# Patient Record
Sex: Female | Born: 1935 | Race: White | Hispanic: No | Smoking: Former smoker
Health system: Southern US, Community
[De-identification: ages and names within clinical notes are randomized; demographics above are authoritative.]

## PROBLEM LIST (undated history)

## (undated) DIAGNOSIS — D509 Iron deficiency anemia, unspecified: Secondary | ICD-10-CM

## (undated) DIAGNOSIS — C50911 Malignant neoplasm of unspecified site of right female breast: Secondary | ICD-10-CM

## (undated) DIAGNOSIS — C50419 Malignant neoplasm of upper-outer quadrant of unspecified female breast: Secondary | ICD-10-CM

## (undated) DIAGNOSIS — M810 Age-related osteoporosis without current pathological fracture: Secondary | ICD-10-CM

## (undated) DIAGNOSIS — F0390 Unspecified dementia without behavioral disturbance: Secondary | ICD-10-CM

## (undated) DIAGNOSIS — E538 Deficiency of other specified B group vitamins: Secondary | ICD-10-CM

## (undated) DIAGNOSIS — C801 Malignant (primary) neoplasm, unspecified: Secondary | ICD-10-CM

## (undated) DIAGNOSIS — C50919 Malignant neoplasm of unspecified site of unspecified female breast: Secondary | ICD-10-CM

## (undated) DIAGNOSIS — I10 Essential (primary) hypertension: Secondary | ICD-10-CM

## (undated) DIAGNOSIS — F40240 Claustrophobia: Secondary | ICD-10-CM

## (undated) HISTORY — DX: Age-related osteoporosis without current pathological fracture: M81.0

## (undated) HISTORY — DX: Malignant neoplasm of unspecified site of right female breast: C50.911

## (undated) HISTORY — DX: Malignant neoplasm of unspecified site of unspecified female breast: C50.919

## (undated) HISTORY — DX: Malignant neoplasm of upper-outer quadrant of unspecified female breast: C50.419

## (undated) HISTORY — PX: CATARACT EXTRACTION, BILATERAL: SHX1313

## (undated) HISTORY — PX: ABDOMINAL HYSTERECTOMY: SHX81

## (undated) HISTORY — DX: Deficiency of other specified B group vitamins: E53.8

## (undated) HISTORY — DX: Iron deficiency anemia, unspecified: D50.9

## (undated) NOTE — *Deleted (*Deleted)
[facility] 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  Her2 negative, ER 100%, PR 85%, Ki-67 17%.   03/08/2013 Surgery   Right lumpectomy with sentinel node biopsy revealing adjacent DCIS (intermediate grade).  Lymph node is negative for disease.  T1b N0.   03/24/2013 Imaging   Bone density-  Osteoporosis   04/26/2013 Survivorship   Prolia every 6 months   04/28/2013 -  Chemotherapy   Aromasin daily.  She will take for 5 years.   04/08/2015 Imaging   Bone density- There has been a statistically significant IMPROVEMENT compared to patient's previous exam of 03/24/2013.   11/14/2015 Pathology Results   BCI- low risk of late recurrence (year 5-10) at 3.7%, low likelihood of benefit from extended endocrine therapy, low risk of overall recurrence (years 0-10) 6.6%.     CHIEF COMPLIANT: Follow-up for right breast cancer   INTERVAL HISTORY: Tamara [name] is a 22 y.o. female here today for follow up of her right breast cancer. Her last visit was on 08/30/2018.   Today she reports   REVIEW OF SYSTEMS:   Review of Systems - Oncology  I have reviewed the past medical history, past surgical history, social history and family history with the patient and they are unchanged from previous note.   ALLERGIES:   has No Known Allergies.   MEDICATIONS:  Current Outpatient Medications  Medication Sig Dispense Refill  . amLODipine (NORVASC) 5 MG tablet Take 5 mg by mouth daily.    . calcium-vitamin D (OSCAL WITH D) 500-200 MG-UNIT per tablet Take 2 tablets by mouth daily with breakfast. 60 tablet 3  . cyanocobalamin (,VITAMIN B-12,) 1000 MCG/ML injection Inject 1 mL (1,000 mcg total)  into the skin every 30 (thirty) days. 10 mL 1  . donepezil (ARICEPT) 10 MG tablet     . exemestane (AROMASIN) 25 MG tablet TAKE 1 TABLET BY MOUTH ONCE DAILY AFTER BREAKFAST 90 tablet 0  . FLUAD QUADRIVALENT 0.5 ML injection     . HYDROcodone-acetaminophen (NORCO/VICODIN) 5-325 MG tablet Take 1 tablet by mouth every 4 (four) hours as needed. (Patient not taking: Reported on 04/11/2020) 10 tablet 0  . lidocaine (LIDODERM) 5 % Place 1 patch onto the skin daily. Remove & Discard patch within 12 hours or as directed by MD 5 patch 0  . losartan (COZAAR) 25 MG tablet     . NEEDLE, DISP, 27 G (BD DISP NEEDLES) 27G X 1/2" MISC 1 Syringe by Does not apply route every 30 (thirty) days. 50 each 0  . olmesartan (BENICAR) 20 MG tablet      No current facility-administered medications for this visit.     PHYSICAL EXAMINATION: Performance status (ECOG): 1 - Symptomatic but completely ambulatory  There were no vitals filed for this visit. Wt Readings from Last 3 Encounters:  04/11/20 144 lb 9.6 oz (65.6 kg)  03/01/19 146 lb (66.2 kg)  08/30/18 148 lb (67.1 kg)   Physical Exam  Breast Exam Chaperone: Deneise Lever, RN   LABORATORY DATA:  I have reviewed the data as listed CMP Latest Ref Rng & Units 04/11/2020 09/15/2019 02/24/2019  Glucose 70 - 99 mg/dL 914(N) 829(F) 92  BUN 8 - 23 mg/dL  16 18 21   Creatinine 0.44 - 1.00 mg/dL 1.61 0.96 0.45  Sodium 135 - 145 mmol/L 140 140 140  Potassium 3.5 - 5.1 mmol/L 3.8 3.8 3.9  Chloride 98 - 111 mmol/L 105 102 106  CO2 22 - 32 mmol/L 28 28 27   Calcium 8.9 - 10.3 mg/dL 9.0 4.0(J) 8.1(X)  Total Protein 6.5 - 8.1 g/dL 7.3 7.1 7.2  Total Bilirubin 0.3 - 1.2 mg/dL 0.8 0.9 0.6  Alkaline Phos 38 - 126 U/L 58 67 67  AST 15 - 41 U/L 16 16 18   ALT 0 - 44 U/L 14 13 15    No results found for: BJY782 Lab Results  Component Value Date   WBC 6.3 04/11/2020   HGB 12.4 04/11/2020   HCT 39.2 04/11/2020   MCV 97.0 04/11/2020   PLT 206 04/11/2020   NEUTROABS 4.4  04/11/2020    ASSESSMENT:  1.  Stage Ia (T1b N0 M0) right breast IDC: - Status post lumpectomy and lymph node biopsy on 03/08/2013, invasive mucinous carcinoma, 0.6 cm, grade 1, margins negative, ER/PR positive, HER-2 negative, Ki-67 17%, PT1BPN0 - Oncotype DX shows recurrence score of 20, average rate of distant recurrence of 13%. - Adjuvant chemotherapy was not offered.  Patient declined radiation therapy. -Aromasin was started in October 2014, continue dental October 2019. -BCI showed low risk of recurrence and low benefit from extended adjuvant endocrine therapy. - Today's physical examination showed right upper outer quadrant lumpectomy scar well-healed.  No palpable mass in bilateral breast.  No palpable axillary adenopathy. -We have also reviewed mammogram dated 02/15/2018 which was BI-RADS Category 2. -We will schedule her for another mammogram in July.  We will see her back in 6 months for follow-up.  2.  Osteoporosis: -DEXA scan on 04/15/2017 shows T score of -2.5. -She is receiving Prolia every 6 months, last on 11/25/2017.  She missed her follow-up injections after that. -We will restart her back on Prolia today.  She was told to take calcium supplements daily.  3.  B12 deficiency: -She was receiving B12 injections monthly until May 2019. -We have reviewed her B12 level which was in the 170 range.  Hence I have recommended her to go back on B12 injections monthly.  We will start them today.   PLAN:  1.  Stage Ia (T1b N0 M0) right breast IDC:  2.  Osteoporosis:  3.  B12 deficiency: ***   Breast Cancer therapy associated bone loss: I have recommended calcium, Vitamin D and weight bearing exercises.  I spent 25 minutes talking to the patient of which more than half was spent in counseling and coordination of care.  No orders of the defined types were placed in this encounter.  The patient has a good understanding of the overall plan. she agrees with it. she will call with  any problems that may develop before the next visit here.    Doreatha Massed, MD [facility] [phone]   I, Drue Second, am acting as a scribe for Dr. Payton Mccallum.  {Add Production assistant, radio Statement}

---

## 2000-12-22 ENCOUNTER — Ambulatory Visit (HOSPITAL_COMMUNITY): Admission: RE | Admit: 2000-12-22 | Discharge: 2000-12-22 | Payer: Self-pay | Admitting: General Surgery

## 2001-10-13 ENCOUNTER — Encounter: Payer: Self-pay | Admitting: Internal Medicine

## 2001-10-13 ENCOUNTER — Ambulatory Visit (HOSPITAL_COMMUNITY): Admission: RE | Admit: 2001-10-13 | Discharge: 2001-10-13 | Payer: Self-pay | Admitting: Internal Medicine

## 2001-10-25 ENCOUNTER — Ambulatory Visit (HOSPITAL_COMMUNITY): Admission: RE | Admit: 2001-10-25 | Discharge: 2001-10-25 | Payer: Self-pay | Admitting: Internal Medicine

## 2002-10-30 ENCOUNTER — Encounter: Payer: Self-pay | Admitting: Internal Medicine

## 2002-10-30 ENCOUNTER — Ambulatory Visit (HOSPITAL_COMMUNITY): Admission: RE | Admit: 2002-10-30 | Discharge: 2002-10-30 | Payer: Self-pay | Admitting: Internal Medicine

## 2003-11-02 ENCOUNTER — Ambulatory Visit (HOSPITAL_COMMUNITY): Admission: RE | Admit: 2003-11-02 | Discharge: 2003-11-02 | Payer: Self-pay | Admitting: Internal Medicine

## 2004-11-13 ENCOUNTER — Ambulatory Visit (HOSPITAL_COMMUNITY): Admission: RE | Admit: 2004-11-13 | Discharge: 2004-11-13 | Payer: Self-pay | Admitting: Internal Medicine

## 2005-11-16 ENCOUNTER — Ambulatory Visit (HOSPITAL_COMMUNITY): Admission: RE | Admit: 2005-11-16 | Discharge: 2005-11-16 | Payer: Self-pay | Admitting: Internal Medicine

## 2005-12-16 ENCOUNTER — Encounter (INDEPENDENT_AMBULATORY_CARE_PROVIDER_SITE_OTHER): Payer: Self-pay | Admitting: *Deleted

## 2005-12-16 ENCOUNTER — Ambulatory Visit (HOSPITAL_COMMUNITY): Admission: RE | Admit: 2005-12-16 | Discharge: 2005-12-16 | Payer: Self-pay | Admitting: General Surgery

## 2006-04-10 ENCOUNTER — Observation Stay (HOSPITAL_COMMUNITY): Admission: EM | Admit: 2006-04-10 | Discharge: 2006-04-12 | Payer: Self-pay | Admitting: Emergency Medicine

## 2006-11-19 ENCOUNTER — Ambulatory Visit (HOSPITAL_COMMUNITY): Admission: RE | Admit: 2006-11-19 | Discharge: 2006-11-19 | Payer: Self-pay | Admitting: Internal Medicine

## 2007-05-24 ENCOUNTER — Ambulatory Visit (HOSPITAL_COMMUNITY): Admission: RE | Admit: 2007-05-24 | Discharge: 2007-05-24 | Payer: Self-pay | Admitting: Internal Medicine

## 2007-06-15 ENCOUNTER — Ambulatory Visit: Payer: Self-pay | Admitting: Internal Medicine

## 2007-06-28 DIAGNOSIS — R05 Cough: Secondary | ICD-10-CM

## 2007-06-28 DIAGNOSIS — R059 Cough, unspecified: Secondary | ICD-10-CM | POA: Insufficient documentation

## 2007-06-29 ENCOUNTER — Ambulatory Visit: Payer: Self-pay | Admitting: Internal Medicine

## 2007-07-05 ENCOUNTER — Ambulatory Visit: Payer: Self-pay | Admitting: Internal Medicine

## 2007-08-10 ENCOUNTER — Ambulatory Visit: Payer: Self-pay | Admitting: Internal Medicine

## 2007-08-10 DIAGNOSIS — M810 Age-related osteoporosis without current pathological fracture: Secondary | ICD-10-CM

## 2007-11-22 ENCOUNTER — Ambulatory Visit (HOSPITAL_COMMUNITY): Admission: RE | Admit: 2007-11-22 | Discharge: 2007-11-22 | Payer: Self-pay | Admitting: Internal Medicine

## 2008-11-23 ENCOUNTER — Ambulatory Visit (HOSPITAL_COMMUNITY): Admission: RE | Admit: 2008-11-23 | Discharge: 2008-11-23 | Payer: Self-pay | Admitting: Internal Medicine

## 2009-11-28 ENCOUNTER — Ambulatory Visit (HOSPITAL_COMMUNITY): Admission: RE | Admit: 2009-11-28 | Discharge: 2009-11-28 | Payer: Self-pay | Admitting: Internal Medicine

## 2010-12-01 ENCOUNTER — Other Ambulatory Visit (HOSPITAL_COMMUNITY): Payer: Self-pay | Admitting: Internal Medicine

## 2010-12-01 DIAGNOSIS — Z139 Encounter for screening, unspecified: Secondary | ICD-10-CM

## 2010-12-02 ENCOUNTER — Ambulatory Visit (HOSPITAL_COMMUNITY)
Admission: RE | Admit: 2010-12-02 | Discharge: 2010-12-02 | Disposition: A | Discharge status: Home / Self Care | Payer: Medicare HMO | Source: Ambulatory Visit | Attending: Internal Medicine | Admitting: Internal Medicine

## 2010-12-02 DIAGNOSIS — Z139 Encounter for screening, unspecified: Secondary | ICD-10-CM

## 2010-12-02 DIAGNOSIS — Z1231 Encounter for screening mammogram for malignant neoplasm of breast: Secondary | ICD-10-CM | POA: Insufficient documentation

## 2010-12-02 NOTE — Assessment & Plan Note (Signed)
[facility]                             PULMONARY OFFICE NOTE   NAME:[name], [name]                         MRN:          [id number]  DATE:06/14/2007                            DOB:          February 09, 1934    CHIEF COMPLAINT:  Cough.   REQUESTING PHYSICIAN:  Kingsley Callander. Ouida Sills, M.D.   REASON FOR CONSULTATION:  Possible pulmonary fibrosis.   HISTORY:  This is a 77 year old white female who actually first started  coughing back in the 1970s, when she was smoking.  However, the cough  seemed much better after stopping smoking.  Over the last several months  it has worsened but in retrospect may have been present for at least  several years off and on.  It is more daytime than night-time.  It is  associated with mild nasal congestion but no excess nasal secretion,  fevers, chills, sweats, orthopnea, PND, or leg swelling.  She denies any  significant dyspnea, although she admits she is not a very active lady.  She has no problems with activities of daily living including housework.   The patient has no history of any myalgias, arthralgias, unintended  weight loss, fevers, chills, sweats.  She has not been exposed to  Macrodantin or amiodarone to her knowledge and never has been diagnosed  with cancer or received chemotherapy of any kind.   PAST MEDICAL HISTORY:  Significant for hysterectomy in 1970s for benign  reasons.   ALLERGIES:  None known.   MEDICATIONS:  None regularly.   SOCIAL HISTORY:  She quit smoking in 1970.  She has worked at [employer]  with no unusual industrial or hobby exposure.   FAMILY HISTORY:  Positive for emphysema in her father and brother who  were heavy smokers.  Cancer in her brother and father also but with no  known primary.   REVIEW OF SYSTEMS:  Taken in detail on the worksheet and is significant  for the problems outlined above.   PHYSICAL EXAMINATION:  GENERAL:  This is a pleasant ambulatory white  female with somewhat of a  hoarse texture with nasal tone voice.  VITAL SIGNS:  She is afebrile with normal vital signs.  HEENT:  Shows moderate turbinates edema with mucopurulent secretions  present on the right.  Oropharynx is clear.  NECK:  Supple without cervical adenopathy or tenderness.  Trachea is  midline.  No thyromegaly.  LUNGS:  Complete clear bilaterally on auscultation percussion except for  minimum crackles on the bases on deep inspiratory maneuvers which do not  produce a cough.  HEART:  There is a regular rate and rhythm without murmur, gallop, or  rub present.  ABDOMEN:  Soft, benign with no palpable organomegaly, masses, or  tenderness.  EXTREMITIES:  Warm without calf tenderness, cyanosis, clubbing.   Chest x-ray was reviewed from May 24, 2007, and shows very mild  pulmonary fibrosis pattern with no prior comparison.   LABORATORY:  Dated from November 17, 2006, shows no significant  eosinophilia.  Bicarb level is normal.   IMPRESSION:  Chronic cough dating back weeks to months  with a very nasal  tone to her voice suggests a diagnosis of chronic sinusitis post drip  nasal syndrome, except for the cough is more present during the day and  is dry in nature.  Therefore, the sinus findings may be unrelated to the  cough.  In addition, she does not have the typical cough of a patient  with pulmonary fibrosis in that it does not occur reproducibly on  inspiration.  I am not able, therefore, to make a unifying diagnosis.   I did recommend the following specifics today:  1. Since she has been on Fosamax long term and reflux is the most      common identifiable and reversible cause of both chronic cough and      fibrosis in the airway, I recommend she stop it for now and also      educate her on a GERD diet.  2. I am going to give her 10 days of Augmentin and prednisone for 6      days to see to what extent it improves the nasal tone to her voice      and her cough.  I have asked her to avoid  antihistamines in favor      of Advil Cold and Sinus p.r.n. nasal congestion and we will have      her come back in 2 weeks for PFTs and if not improved clinically at      that point, will need sinus and chest CT scan, IgE level, sed rate,      CBC with differential, and BNP to complete the workup.     Charlaine Dalton. Sherene Sires, MD, [facility]  Electronically Signed    MBW/MedQ  DD: 06/16/2007  DT: 06/16/2007  Job #: [id number]   cc:   Kingsley Callander. Ouida Sills, MD

## 2010-12-05 NOTE — H&P (Signed)
NAME:  Tamara [name], Tamara [name]                  ACCOUNT NO.:  [id number]   MEDICAL RECORD NO.:  [id number]          PATIENT TYPE:  AMB   LOCATION:                                FACILITY:  [facility]   PHYSICIAN:  Dalia Heading, M.D.  DATE OF BIRTH:  1934-07-18   DATE OF ADMISSION:  DATE OF DISCHARGE:  LH                                HISTORY & PHYSICAL   AGE:  77 years old   CHIEF COMPLAINT:  Family history of colon carcinoma.   HISTORY OF PRESENT ILLNESS:  Patient is a 77 year old of white female who  was referred for screening colonoscopy.  She has a brother who has been  diagnosed with colon cancer and thus needs a follow-up colonoscopy.  She  last had a colonoscopy in 2002.  That one revealed left-sided  diverticulosis.  She denies any recent gastrointestinal complaints.   PAST MEDICAL HISTORY:  Osteoporosis.   PAST SURGICAL HISTORY:  As noted above, hysterectomy.   CURRENT MEDICATIONS:  Fosamax, Xanax p.r.n.   ALLERGIES:  No known drug allergies.   REVIEW OF SYSTEMS:  Patient denies drinking smoking.  She denies any other  cardiopulmonary difficulties or bleeding disorders.   PHYSICAL EXAMINATION:  GENERAL:  Patient is a well-developed, well-nourished  white female in no acute distress.  LUNGS:  Clear to auscultation with equal breath sounds bilaterally.  HEART:  Regular rate and rhythm without S3, S4, or murmurs.  ABDOMEN:  Soft, nontender, nondistended.  RECTAL:  Deferred to the procedure.   IMPRESSION:  Family history of colon cancer.   PLAN:  The patient is scheduled for colonoscopy on Dec 16, 2005.  The risks  and benefits of the procedure including bleeding and perforation were fully  explained to the patient, gave informed consent.      Dalia Heading, M.D.  Electronically Signed     MAJ/MEDQ  D:  12/08/2005  T:  12/08/2005  Job:  [id number]   cc:   [facility]  Fax: [phone]   Kingsley Callander. Ouida Sills, MD  Fax: [phone]

## 2010-12-05 NOTE — Discharge Summary (Signed)
Tamara [name], Tamara [name]                  ACCOUNT NO.:  [id number]   MEDICAL RECORD NO.:  [id number]          PATIENT TYPE:  OBV   LOCATION:  A201                          FACILITY:  [facility]   PHYSICIAN:  Kingsley Callander. Ouida Sills, MD       DATE OF BIRTH:  12/10/1933   DATE OF ADMISSION:  04/10/2006  DATE OF DISCHARGE:  09/24/2007LH                                 DISCHARGE SUMMARY   DISCHARGE DIAGNOSES:  1. Lower gastrointestinal bleed likely due to diverticulosis.  2. Normocytic anemia   HOSPITAL COURSE:  This patient is a 77 year old female who presented with  bright red rectal bleeding.  She was hospitalized and monitored. There were  no episodes of hypotension.  Her hemoglobin trended down over 2 days to 8.2  and then stabilized. Followup hemoglobin is 8.3.  She denies abdominal pain.  She has not had bleeding for over a day.  She had undergone a colonoscopy  which revealed diverticulosis in May 2007. Aspirin was stopped.  She was  treated empirically with Protonix.  She will have a followup hemoglobin in 1  week. Her diet has been advanced without any problems.  She will follow up  promptly if she has recurrent bleeding, abdominal pain or any syncopal  symptoms. She has fortunately not required a transfusion thus far.   DISCHARGE MEDICATIONS:  1. Iron twice a day.  2. Calcium b.i.d.  3. Multivitamin daily.  4. Fosamax weekly which she will restart in a week.   FAMILY HISTORY:  In 2 weeks, hemoglobin and hematocrit in a week.      Kingsley Callander. Ouida Sills, MD  Electronically Signed     ROF/MEDQ  D:  04/12/2006  T:  04/14/2006  Job:  [id number]

## 2010-12-05 NOTE — H&P (Signed)
NAMEVASHON, ARCH                  ACCOUNT NO.:  [id number]   MEDICAL RECORD NO.:  [id number]          PATIENT TYPE:  OBV   LOCATION:  A201                          FACILITY:  [facility]   PHYSICIAN:  Edward L. Juanetta Gosling, M.D.DATE OF BIRTH:  1933-09-13   DATE OF ADMISSION:  04/10/2006  DATE OF DISCHARGE:  LH                                HISTORY & PHYSICAL   REASON FOR VISIT:  Is a patient of Dr. Alonza Smoker being brought in for  observation.   HISTORY OF PRESENT ILLNESS:  [name] is a 77 year old who noticed today  that she had blood in her underwear.  She then had a bowel movement which  also had blood in it.  She has never had any previous similar problems.  She  does have a history of diverticulosis on the left side apparently.  She had  a colonoscopy done this year that was said to be okay.  She does have a  brother who had colon cancer.  Her personal history is essentially negative.  She does have osteoporosis and she currently takes a multiple vitamin,  calcium and aspirin 81 mg daily.  Surgically she has had a hysterectomy but  no other surgeries. Her family history as mentioned is positive for colon  cancer in a brother.   SOCIAL HISTORY:  She lives at home with her husband.  She drinks alcohol on  very rare occasions.  She stopped smoking greater than 30 years ago.   PHYSICAL EXAMINATION:  GENERAL:  Shows a well-developed, well-nourished  female who does not appear to be in any acute distress.  She does not have  any postural changes.  Her color is good.  VITALS:  Her blood pressure is 120/70, pulse is 80.  HEENT:  Mucous membranes are moist.  Her conjunctive do not show any  evidence of pallor.  CHEST:  Clear.  HEART:  Regular without gallop.  ABDOMEN:  Soft, nontender.  RECTAL EXAM:  Done by Dr. Margretta Ditty in the emergency room shows fresh blood  on the examining finger. No hemorrhoids.   LABORATORY DATA:  Lab work is pending.   ASSESSMENT:  She has GI bleeding which appears  at this point to be fairly  minor but she certainly could be having a diverticular bleed.  I am going to  go ahead and have her in observation.  Check hemoglobin/hematocrit every 4  hours times 24 hours.  Give her some IV fluids, clear liquid diet.      Edward L. Juanetta Gosling, M.D.  Electronically Signed    ELH/MEDQ  D:  04/10/2006  T:  04/12/2006  Job:  [id number]

## 2010-12-12 ENCOUNTER — Other Ambulatory Visit (HOSPITAL_COMMUNITY): Payer: Self-pay | Admitting: Internal Medicine

## 2010-12-12 DIAGNOSIS — J8410 Pulmonary fibrosis, unspecified: Secondary | ICD-10-CM

## 2010-12-12 DIAGNOSIS — R0602 Shortness of breath: Secondary | ICD-10-CM

## 2010-12-17 ENCOUNTER — Ambulatory Visit (HOSPITAL_COMMUNITY)
Admission: RE | Admit: 2010-12-17 | Discharge: 2010-12-17 | Disposition: A | Discharge status: Home / Self Care | Payer: Medicare HMO | Source: Ambulatory Visit | Attending: Internal Medicine | Admitting: Internal Medicine

## 2010-12-17 ENCOUNTER — Encounter (HOSPITAL_COMMUNITY): Payer: Self-pay

## 2010-12-17 ENCOUNTER — Ambulatory Visit (HOSPITAL_COMMUNITY): Payer: Medicare HMO

## 2010-12-17 DIAGNOSIS — J8410 Pulmonary fibrosis, unspecified: Secondary | ICD-10-CM

## 2010-12-17 DIAGNOSIS — Z87891 Personal history of nicotine dependence: Secondary | ICD-10-CM | POA: Insufficient documentation

## 2010-12-17 DIAGNOSIS — R0602 Shortness of breath: Secondary | ICD-10-CM | POA: Insufficient documentation

## 2010-12-17 DIAGNOSIS — M818 Other osteoporosis without current pathological fracture: Secondary | ICD-10-CM | POA: Insufficient documentation

## 2010-12-17 HISTORY — DX: Essential (primary) hypertension: I10

## 2010-12-22 NOTE — H&P (Signed)
  NAMEAMARACHUKWU, LAKATOS                  ACCOUNT NO.:  [id number]  MEDICAL RECORD NO.:  [id number]  LOCATION:                                 FACILITY:  PHYSICIAN:  Dalia Heading, M.D.  DATE OF BIRTH:  1933-12-19  DATE OF ADMISSION: DATE OF DISCHARGE:  LH                             HISTORY & PHYSICAL   CHIEF COMPLAINT:  Need for screening colonoscopy.  HISTORY OF PRESENT ILLNESS:  The patient is a 77 year old white female who presents for a followup colonoscopy.  She needs a colonoscopy due to a family history of colon cancer in her brother.  Her last colonoscopy was over 5 years ago.  She denies any gastrointestinal complaints.  PAST MEDICAL HISTORY:  Hypertension.  PAST SURGICAL HISTORY:  Unremarkable.  CURRENT MEDICATIONS:  Evista, amlodipine, multivitamins.  ALLERGIES:  No known drug allergies.  REVIEW OF SYSTEMS:  The patient denies drinking or smoking.  She denies any other cardiopulmonary difficulties or bleeding disorders.  FAMILY MEDICAL HISTORY:  Remarkable for a brother with colon cancer.  He has since passed away.  PHYSICAL EXAMINATION:  GENERAL:  The patient is a well-developed, well- nourished white female in no acute distress.  She is afebrile. VITAL SIGNS:  Stable. HEENT:  Unremarkable. NECK:  Supple without lymphadenopathy. LUNGS:  Clear to auscultation with equal breath sounds bilaterally. HEART:  Regular rate and rhythm without S3, S4, murmurs. ABDOMEN:  Soft, nontender, nondistended.  No hepatosplenomegaly or masses are noted. RECTAL:  Deferred to the procedure.  IMPRESSION:  Need for screening colonoscopy.  PLAN:  The patient is scheduled for a colonoscopy on December 23, 2010.  The risks and benefits of the procedure including bleeding and perforation were fully explained to the patient, gave informed consent.     Dalia Heading, M.D.     MAJ/MEDQ  D:  12/18/2010  T:  12/19/2010  Job:  [id number]  cc:   Short Stay at [facility].  Kingsley Callander.  Ouida Sills, MD Fax: [phone]  Electronically Signed by Franky Macho M.D. on 12/22/2010 07:47:53 PM

## 2010-12-23 ENCOUNTER — Ambulatory Visit (HOSPITAL_COMMUNITY)
Admission: RE | Admit: 2010-12-23 | Discharge: 2010-12-23 | Disposition: A | Discharge status: Home / Self Care | Payer: Medicare HMO | Source: Ambulatory Visit | Attending: General Surgery | Admitting: General Surgery

## 2010-12-23 DIAGNOSIS — Z79899 Other long term (current) drug therapy: Secondary | ICD-10-CM | POA: Insufficient documentation

## 2010-12-23 DIAGNOSIS — Z1211 Encounter for screening for malignant neoplasm of colon: Secondary | ICD-10-CM | POA: Insufficient documentation

## 2010-12-23 DIAGNOSIS — Z800 Family history of malignant neoplasm of digestive organs: Secondary | ICD-10-CM | POA: Insufficient documentation

## 2010-12-23 DIAGNOSIS — I10 Essential (primary) hypertension: Secondary | ICD-10-CM | POA: Insufficient documentation

## 2010-12-23 DIAGNOSIS — K5730 Diverticulosis of large intestine without perforation or abscess without bleeding: Secondary | ICD-10-CM | POA: Insufficient documentation

## 2010-12-25 NOTE — Op Note (Signed)
  NAMECALLEEN, Tamara [name]                  ACCOUNT NO.:  [id number]  MEDICAL RECORD NO.:  [id number]  LOCATION:  DAYP                          FACILITY:  [facility]  PHYSICIAN:  Dalia Heading, M.D.  DATE OF BIRTH:  1933-11-04  DATE OF PROCEDURE:  12/23/2010 DATE OF DISCHARGE:                              OPERATIVE REPORT   PREOPERATIVE DIAGNOSIS:  Need for screening colonoscopy.  POSTOPERATIVE DIAGNOSES:  Need for screening colonoscopy, descending colon diverticulosis.  PROCEDURE:  Colonoscopy.  SURGEON:  Dalia Heading, MD  ANESTHESIA: 1. Demerol 50 mg IV. 2. Versed 4 mg IV  INDICATIONS:  The patient is a 77 year old white female who presents for screening colonoscopy.  She also needs a colonoscopy due to a family history of colon cancer in her brother.  The risks and benefits of the procedure including bleeding and perforation were fully explained to the patient, gave informed consent.  PROCEDURE NOTE:  The patient was placed in the left lateral decubitus position after placement of nasal cannula, pulse oximetry, and noninvasive blood pressure monitoring.  Demerol and Versed were used throughout the procedure for anesthesia.  Rectal examination was performed which was negative.  The colonoscope was advanced to the cecum with moderate difficulty due to a less than adequate bowel prep.  The cecum was fully visualized and noted to be within normal limits.  Confirmation of placement to the cecum was done using transabdominal palpation and landmarks.  The cecum, ascending colon, transverse colon were all within normal limits.  No abnormal lesions were noted.  In the descending and sigmoid colon regions, extensive diverticular disease was seen.  There was no stricture formation.  The rectum was within normal limits.  No abnormal lesions were noted.  On retroflexion of the colonoscope, the dentate line was inspected and noted to be within normal limits.  The colonoscope was returned  to the neutral position, and all air was evacuated from the colon and rectum prior to removal of the endoscope.  The patient tolerated the procedure well and was transferred back to Day Surgery in stable condition.  COMPLICATIONS:  None.  SPECIMEN:  None.  RECOMMENDATIONS:  A followup colonoscopy is recommended in 5 years due to her family history of colon cancer.  A diverticular diet has been instructed for the patient.     Dalia Heading, M.D.     MAJ/MEDQ  D:  12/23/2010  T:  12/23/2010  Job:  [id number]  cc:   Kingsley Callander. Ouida Sills, MD Fax: [phone]  Electronically Signed by Franky Macho M.D. on 12/25/2010 06:31:49 PM

## 2011-11-02 ENCOUNTER — Other Ambulatory Visit (HOSPITAL_COMMUNITY): Payer: Self-pay | Admitting: Internal Medicine

## 2011-11-02 DIAGNOSIS — Z139 Encounter for screening, unspecified: Secondary | ICD-10-CM

## 2011-12-07 ENCOUNTER — Ambulatory Visit (HOSPITAL_COMMUNITY)
Admission: RE | Admit: 2011-12-07 | Discharge: 2011-12-07 | Disposition: A | Discharge status: Home / Self Care | Payer: Medicare HMO | Source: Ambulatory Visit | Attending: Internal Medicine | Admitting: Internal Medicine

## 2011-12-07 DIAGNOSIS — Z139 Encounter for screening, unspecified: Secondary | ICD-10-CM

## 2011-12-07 DIAGNOSIS — Z1231 Encounter for screening mammogram for malignant neoplasm of breast: Secondary | ICD-10-CM | POA: Insufficient documentation

## 2011-12-08 ENCOUNTER — Ambulatory Visit (HOSPITAL_COMMUNITY): Payer: Medicare HMO

## 2012-12-22 ENCOUNTER — Other Ambulatory Visit (HOSPITAL_COMMUNITY): Payer: Self-pay | Admitting: Internal Medicine

## 2012-12-22 DIAGNOSIS — Z139 Encounter for screening, unspecified: Secondary | ICD-10-CM

## 2012-12-26 ENCOUNTER — Ambulatory Visit (HOSPITAL_COMMUNITY)
Admission: RE | Admit: 2012-12-26 | Discharge: 2012-12-26 | Disposition: A | Discharge status: Home / Self Care | Payer: Medicare Other | Source: Ambulatory Visit | Attending: Internal Medicine | Admitting: Internal Medicine

## 2012-12-26 DIAGNOSIS — Z1231 Encounter for screening mammogram for malignant neoplasm of breast: Secondary | ICD-10-CM | POA: Insufficient documentation

## 2012-12-26 DIAGNOSIS — Z139 Encounter for screening, unspecified: Secondary | ICD-10-CM

## 2012-12-27 ENCOUNTER — Other Ambulatory Visit: Payer: Self-pay | Admitting: Internal Medicine

## 2012-12-27 DIAGNOSIS — R928 Other abnormal and inconclusive findings on diagnostic imaging of breast: Secondary | ICD-10-CM

## 2013-01-04 ENCOUNTER — Other Ambulatory Visit: Payer: Self-pay | Admitting: Internal Medicine

## 2013-01-04 ENCOUNTER — Other Ambulatory Visit (HOSPITAL_COMMUNITY): Payer: Self-pay | Admitting: Internal Medicine

## 2013-01-04 ENCOUNTER — Ambulatory Visit (HOSPITAL_COMMUNITY)
Admission: RE | Admit: 2013-01-04 | Discharge: 2013-01-04 | Disposition: A | Discharge status: Home / Self Care | Payer: Medicare Other | Source: Ambulatory Visit | Attending: Internal Medicine | Admitting: Internal Medicine

## 2013-01-04 ENCOUNTER — Ambulatory Visit: Payer: Medicare Other

## 2013-01-04 DIAGNOSIS — R928 Other abnormal and inconclusive findings on diagnostic imaging of breast: Secondary | ICD-10-CM

## 2013-01-04 DIAGNOSIS — N63 Unspecified lump in unspecified breast: Secondary | ICD-10-CM | POA: Insufficient documentation

## 2013-01-04 MED ORDER — LIDOCAINE HCL (PF) 2 % IJ SOLN
INTRAMUSCULAR | Status: AC
  Filled 2013-01-04: qty 10

## 2013-01-11 ENCOUNTER — Ambulatory Visit (HOSPITAL_COMMUNITY)
Admission: RE | Admit: 2013-01-11 | Discharge: 2013-01-11 | Disposition: A | Discharge status: Home / Self Care | Payer: Medicare Other | Source: Ambulatory Visit | Attending: Internal Medicine | Admitting: Internal Medicine

## 2013-01-11 DIAGNOSIS — C50919 Malignant neoplasm of unspecified site of unspecified female breast: Secondary | ICD-10-CM | POA: Insufficient documentation

## 2013-01-11 MED ORDER — LIDOCAINE HCL (PF) 2 % IJ SOLN
INTRAMUSCULAR | Status: AC
  Administered 2013-01-11: 9 mL via INTRADERMAL
  Filled 2013-01-11: qty 10

## 2013-01-11 NOTE — Progress Notes (Signed)
Lidocaine 2%         9mL injected                         Right breast biopsy performed  

## 2013-01-24 ENCOUNTER — Ambulatory Visit: Payer: Self-pay | Admitting: Ophthalmology

## 2013-02-07 ENCOUNTER — Other Ambulatory Visit (HOSPITAL_COMMUNITY): Payer: Self-pay | Admitting: General Surgery

## 2013-02-07 DIAGNOSIS — C50911 Malignant neoplasm of unspecified site of right female breast: Secondary | ICD-10-CM

## 2013-02-13 NOTE — H&P (Signed)
Tamara [name] is an 79 y.o. female.   Chief Complaint: Right breast carcinoma HPI: Patient is a 79 year old white female who was found on routine mammography to have a suspicious lesion in the right breast. Biopsy showed this to be invasive ductal carcinoma, 8 mm in greatest diameter. There is no family history breast cancer. No nipple discharge or palpable masses noted by the patient.  Past Medical History  Diagnosis Date  . Hypertension     No past surgical history on file.  No family history on file. Social History:  has no tobacco, alcohol, and drug history on file.  Allergies: Allergies not on file  No prescriptions prior to admission    No results found for this or any previous visit (from the past 48 hour(s)). No results found.  Review of Systems  All other systems reviewed and are negative.    There were no vitals taken for this visit. Physical Exam  Constitutional: She is oriented to person, place, and time. She appears well-developed and well-nourished.  HENT:  Head: Normocephalic and atraumatic.  Neck: Normal range of motion. Neck supple.  Cardiovascular: Normal rate, regular rhythm and normal heart sounds.   Respiratory: Effort normal and breath sounds normal.  GI: Soft. Bowel sounds are normal.  Neurological: She is alert and oriented to person, place, and time.  Skin: Skin is warm and dry.   breast: No dominant mass, nipple discharge, dimpling in either breast. Both axillas negative for palpable nodes.  Assessment/Plan Impression: Right breast carcinoma, clinical stage I  Plan: The patient scheduled for right partial mastectomy after needle localization, sentinel lymph node biopsy, possible axillary dissection. The risks and benefits of the procedures including bleeding, infection, and a possibly nerve injury were fully explained to the patient, who gave informed consent.  Adrieana Fennelly A 02/13/2013, 11:31 AM

## 2013-02-14 ENCOUNTER — Ambulatory Visit: Payer: Self-pay | Admitting: Ophthalmology

## 2013-02-20 ENCOUNTER — Encounter (HOSPITAL_COMMUNITY): Payer: Self-pay | Admitting: Pharmacy Technician

## 2013-02-23 ENCOUNTER — Other Ambulatory Visit (HOSPITAL_COMMUNITY): Payer: Medicare Other

## 2013-03-01 ENCOUNTER — Encounter (HOSPITAL_COMMUNITY): Payer: Medicare Other

## 2013-03-01 ENCOUNTER — Other Ambulatory Visit (HOSPITAL_COMMUNITY): Payer: Medicare Other

## 2013-03-02 ENCOUNTER — Encounter (HOSPITAL_COMMUNITY)
Admission: RE | Admit: 2013-03-02 | Discharge: 2013-03-02 | Disposition: A | Discharge status: Home / Self Care | Payer: Medicare Other | Source: Ambulatory Visit | Attending: General Surgery | Admitting: General Surgery

## 2013-03-02 ENCOUNTER — Encounter (HOSPITAL_COMMUNITY): Payer: Self-pay

## 2013-03-02 ENCOUNTER — Ambulatory Visit (HOSPITAL_COMMUNITY): Payer: Medicare Other

## 2013-03-02 DIAGNOSIS — Z01812 Encounter for preprocedural laboratory examination: Secondary | ICD-10-CM | POA: Insufficient documentation

## 2013-03-02 DIAGNOSIS — Z01810 Encounter for preprocedural cardiovascular examination: Secondary | ICD-10-CM | POA: Insufficient documentation

## 2013-03-02 DIAGNOSIS — Z01818 Encounter for other preprocedural examination: Secondary | ICD-10-CM | POA: Insufficient documentation

## 2013-03-02 LAB — CBC WITH DIFFERENTIAL/PLATELET
Basophils Relative: 0 % (ref 0–1)
Eosinophils Absolute: 0.2 10*3/uL (ref 0.0–0.7)
Hemoglobin: 11.7 g/dL — ABNORMAL LOW (ref 12.0–15.0)
MCH: 31.3 pg (ref 26.0–34.0)
MCHC: 32.8 g/dL (ref 30.0–36.0)
Monocytes Relative: 9 % (ref 3–12)
Neutrophils Relative %: 63 % (ref 43–77)
Platelets: 220 10*3/uL (ref 150–400)

## 2013-03-02 LAB — COMPREHENSIVE METABOLIC PANEL
Albumin: 3.4 g/dL — ABNORMAL LOW (ref 3.5–5.2)
Alkaline Phosphatase: 60 U/L (ref 39–117)
BUN: 13 mg/dL (ref 6–23)
Potassium: 4.1 mEq/L (ref 3.5–5.1)
Total Protein: 6.8 g/dL (ref 6.0–8.3)

## 2013-03-02 NOTE — Patient Instructions (Addendum)
Tamara [name]  03/02/2013   Your procedure is scheduled on:  03/08/2013  Report to [facility] at  700  AM.  Call this number if you have problems the morning of surgery: [phone]   Remember:   Do not eat food or drink liquids after midnight.   Take these medicines the morning of surgery with A SIP OF WATER: norvasc, claritin, evista   Do not wear jewelry, make-up or nail polish.  Do not wear lotions, powders, or perfumes.   Do not shave 48 hours prior to surgery. Men may shave face and neck.  Do not bring valuables to the hospital.  [facility] is not responsible for any belongings or valuables.  Contacts, dentures or bridgework may not be worn into surgery.  Leave suitcase in the car. After surgery it may be brought to your room.  For patients admitted to the hospital, checkout time is 11:00 AM the day of discharge.   Patients discharged the day of surgery will not be allowed to drive  home.  Name and phone number of your driver: family  Special Instructions: Shower using CHG 2 nights before surgery and the night before surgery.  If you shower the day of surgery use CHG.  Use special wash - you have one bottle of CHG for all showers.  You should use approximately 1/3 of the bottle for each shower.   Please read over the following fact sheets that you were given: Pain Booklet, Coughing and Deep Breathing, Surgical Site Infection Prevention, Anesthesia Post-op Instructions and Care and Recovery After Surgery Breast Biopsy A breast biopsy is a procedure where a sample of breast tissue is removed from your breast. The tissue is examined under a microscope to see if cancerous cells are present. A breast biopsy is done when there is:  Any undiagnosed breast mass (tumor).  Nipple abnormalities, dimpling, crusting, or ulcerations.  Abnormal discharge from the nipple, especially blood.  Redness, swelling, and pain of the breast.  Calcium deposits (calcifications) or abnormalities  seen on a mammogram, ultrasound result, or results of magnetic resonance imaging (MRI).  Suspicious changes in the breast seen on your mammogram. If the tumor is found to be cancerous (malignant), a breast biopsy can help to determine what the best treatment is for you. There are many different types of breast biopsies. Talk to your caregiver about your options and which type is best for you. LET YOUR CAREGIVER KNOW ABOUT:  Allergies to food or medicine.  Medicines taken, including vitamins, herbs, eyedrops, over-the-counter medicines, and creams.  Use of steroids (by mouth or creams).  Previous problems with anesthetics or numbing medicines.  History of bleeding problems or blood clots.  Previous surgery.  Other health problems, including diabetes and kidney problems.  Any recent colds or infections.  Possibility of pregnancy, if this applies. RISKS AND COMPLICATIONS   Bleeding.  Infection.  Allergy to medicines.  Bruising and swelling of the breast.  Alteration in the shape of the breast.  Not finding the lump or abnormality.  Needing more surgery. BEFORE THE PROCEDURE  Arrange for someone to drive you home after the procedure.  Do not smoke for 2 weeks before the procedure. Stop smoking, if you smoke.  Do not drink alcohol for 24 hours before procedure.  Wear a good support bra to the procedure. PROCEDURE  You may be given a medicine to numb the breast area (local anesthesia) or a medicine to make you sleep (general  anesthesia) during the procedure. The following are the different types of biopsies that can be performed.   Fine-needle aspiration A thin needle is attached to a syringe and inserted into the breast lump. Fluid and cells are removed and then looked at under a microscope. If the breast lump cannot be felt, an ultrasound may be used to help locate the lump and place the needle in the correct area.   Core needle biopsy A wide, hollow needle (core  needle) is inserted into the breast lump 3 6 times to get tissue samples or cores. The samples are removed. The needle is usually placed in the correct area by using an ultrasound or X-ray.   Stereotactic biopsy X-ray equipment and a computer are used to analyze X-ray pictures of the breast lump. The computer then finds exactly where the core needle needs to be inserted. Tissue samples are removed.   Vacuum-assisted biopsy A small incision (less than  inch) is made in your breast. A biopsy device that includes a hollow needle and vacuum is passed through the incision and into the breast tissue. The vacuum gently draws abnormal breast tissue into the needle to remove it. This type of biopsy removes a larger tissue sample than a regular core needle biopsy. No stitches are needed, and there is usually little scarring.  Ultrasound-guided core needle biopsy A high frequency ultrasound helps guide the core needle to the area of the mass or abnormality. An incision is made to insert the needle. Tissue samples are removed.  Open biopsy A larger incision is made in the breast. Your caregiver will attempt to remove the whole breast lump or as much as possible. AFTER THE PROCEDURE  You will be taken to the recovery area. If you are doing well and have no problems, you will be allowed to go home.  You may notice bruising on your breast. This is normal.  Your caregiver may apply a pressure dressing on your breast for 24 48 hours. A pressure dressing is a bandage that is wrapped tightly around the chest to stop fluid from collecting underneath tissues. Document Released: 07/06/2005 Document Revised: 01/05/2012 Document Reviewed: 08/06/2011 [facility] Patient Information 2014 [place], [place]. PATIENT INSTRUCTIONS POST-ANESTHESIA  IMMEDIATELY FOLLOWING SURGERY:  Do not drive or operate machinery for the first twenty four hours after surgery.  Do not make any important decisions for twenty four hours after  surgery or while taking narcotic pain medications or sedatives.  If you develop intractable nausea and vomiting or a severe headache please notify your doctor immediately.  FOLLOW-UP:  Please make an appointment with your surgeon as instructed. You do not need to follow up with anesthesia unless specifically instructed to do so.  WOUND CARE INSTRUCTIONS (if applicable):  Keep a dry clean dressing on the anesthesia/puncture wound site if there is drainage.  Once the wound has quit draining you may leave it open to air.  Generally you should leave the bandage intact for twenty four hours unless there is drainage.  If the epidural site drains for more than 36-48 hours please call the anesthesia department.  QUESTIONS?:  Please feel free to call your physician or the hospital operator if you have any questions, and they will be happy to assist you.

## 2013-03-08 ENCOUNTER — Encounter (HOSPITAL_COMMUNITY)
Admission: RE | Disposition: A | Discharge status: Home / Self Care | Payer: Self-pay | Source: Ambulatory Visit | Attending: General Surgery

## 2013-03-08 ENCOUNTER — Encounter (HOSPITAL_COMMUNITY): Payer: Self-pay

## 2013-03-08 ENCOUNTER — Ambulatory Visit (HOSPITAL_COMMUNITY): Payer: Medicare Other | Admitting: Anesthesiology

## 2013-03-08 ENCOUNTER — Encounter (HOSPITAL_COMMUNITY): Payer: Self-pay | Admitting: Anesthesiology

## 2013-03-08 ENCOUNTER — Encounter (HOSPITAL_COMMUNITY)
Admission: RE | Admit: 2013-03-08 | Discharge: 2013-03-08 | Disposition: A | Discharge status: Home / Self Care | Payer: Medicare Other | Source: Ambulatory Visit | Attending: General Surgery | Admitting: General Surgery

## 2013-03-08 ENCOUNTER — Encounter (HOSPITAL_COMMUNITY): Payer: Self-pay | Admitting: *Deleted

## 2013-03-08 ENCOUNTER — Ambulatory Visit (HOSPITAL_COMMUNITY): Payer: Medicare Other

## 2013-03-08 ENCOUNTER — Ambulatory Visit (HOSPITAL_COMMUNITY)
Admission: RE | Admit: 2013-03-08 | Discharge: 2013-03-08 | Disposition: A | Discharge status: Home / Self Care | Payer: Medicare Other | Source: Ambulatory Visit | Attending: General Surgery | Admitting: General Surgery

## 2013-03-08 DIAGNOSIS — C50911 Malignant neoplasm of unspecified site of right female breast: Secondary | ICD-10-CM

## 2013-03-08 DIAGNOSIS — C50919 Malignant neoplasm of unspecified site of unspecified female breast: Secondary | ICD-10-CM | POA: Insufficient documentation

## 2013-03-08 HISTORY — PX: PARTIAL MASTECTOMY WITH NEEDLE LOCALIZATION AND AXILLARY SENTINEL LYMPH NODE BX: SHX6009

## 2013-03-08 HISTORY — DX: Malignant (primary) neoplasm, unspecified: C80.1

## 2013-03-08 HISTORY — DX: Claustrophobia: F40.240

## 2013-03-08 SURGERY — PARTIAL MASTECTOMY WITH NEEDLE LOCALIZATION AND AXILLARY SENTINEL LYMPH NODE BX
Anesthesia: General | Site: Breast | Laterality: Right | Wound class: Clean

## 2013-03-08 MED ORDER — ENOXAPARIN SODIUM 40 MG/0.4ML ~~LOC~~ SOLN
40.0000 mg | Freq: Once | SUBCUTANEOUS | Status: AC
  Administered 2013-03-08: 40 mg via SUBCUTANEOUS

## 2013-03-08 MED ORDER — FENTANYL CITRATE 0.05 MG/ML IJ SOLN: 25.0000 ug | INTRAMUSCULAR | Status: DC | PRN

## 2013-03-08 MED ORDER — MIDAZOLAM HCL 2 MG/2ML IJ SOLN
INTRAMUSCULAR | Status: AC
  Filled 2013-03-08: qty 2

## 2013-03-08 MED ORDER — FENTANYL CITRATE 0.05 MG/ML IJ SOLN
INTRAMUSCULAR | Status: DC | PRN
  Administered 2013-03-08: 50 ug via INTRAVENOUS
  Administered 2013-03-08: 25 ug via INTRAVENOUS
  Administered 2013-03-08: 50 ug via INTRAVENOUS
  Administered 2013-03-08: 25 ug via INTRAVENOUS

## 2013-03-08 MED ORDER — METHYLENE BLUE 1 % INJ SOLN
INTRAMUSCULAR | Status: DC | PRN
  Administered 2013-03-08: 2 mL via SUBMUCOSAL

## 2013-03-08 MED ORDER — MIDAZOLAM HCL 2 MG/2ML IJ SOLN
1.0000 mg | INTRAMUSCULAR | Status: DC | PRN
  Administered 2013-03-08 (×2): 2 mg via INTRAVENOUS

## 2013-03-08 MED ORDER — LIDOCAINE HCL (PF) 1 % IJ SOLN
INTRAMUSCULAR | Status: AC
  Filled 2013-03-08: qty 5

## 2013-03-08 MED ORDER — KETOROLAC TROMETHAMINE 30 MG/ML IJ SOLN
INTRAMUSCULAR | Status: AC
  Filled 2013-03-08: qty 1

## 2013-03-08 MED ORDER — GLYCOPYRROLATE 0.2 MG/ML IJ SOLN
INTRAMUSCULAR | Status: DC | PRN
  Administered 2013-03-08: 0.2 mg via INTRAVENOUS

## 2013-03-08 MED ORDER — NEOSTIGMINE METHYLSULFATE 1 MG/ML IJ SOLN
INTRAMUSCULAR | Status: AC
  Filled 2013-03-08: qty 1

## 2013-03-08 MED ORDER — ROCURONIUM BROMIDE 100 MG/10ML IV SOLN
INTRAVENOUS | Status: DC | PRN
  Administered 2013-03-08: 35 mg via INTRAVENOUS

## 2013-03-08 MED ORDER — FENTANYL CITRATE 0.05 MG/ML IJ SOLN
INTRAMUSCULAR | Status: AC
  Filled 2013-03-08: qty 2

## 2013-03-08 MED ORDER — ENOXAPARIN SODIUM 40 MG/0.4ML ~~LOC~~ SOLN
SUBCUTANEOUS | Status: AC
  Filled 2013-03-08: qty 0.4

## 2013-03-08 MED ORDER — ONDANSETRON HCL 4 MG/2ML IJ SOLN
4.0000 mg | Freq: Once | INTRAMUSCULAR | Status: AC
  Administered 2013-03-08: 4 mg via INTRAVENOUS

## 2013-03-08 MED ORDER — BUPIVACAINE HCL (PF) 0.5 % IJ SOLN
INTRAMUSCULAR | Status: DC | PRN
  Administered 2013-03-08: 13 mL

## 2013-03-08 MED ORDER — DEXAMETHASONE SODIUM PHOSPHATE 4 MG/ML IJ SOLN
INTRAMUSCULAR | Status: AC
  Filled 2013-03-08: qty 1

## 2013-03-08 MED ORDER — NEOSTIGMINE METHYLSULFATE 1 MG/ML IJ SOLN
INTRAMUSCULAR | Status: DC | PRN
  Administered 2013-03-08 (×2): 1 mg via INTRAVENOUS

## 2013-03-08 MED ORDER — BUPIVACAINE HCL (PF) 0.5 % IJ SOLN
INTRAMUSCULAR | Status: AC
  Filled 2013-03-08: qty 30

## 2013-03-08 MED ORDER — FENTANYL CITRATE 0.05 MG/ML IJ SOLN
INTRAMUSCULAR | Status: AC
  Filled 2013-03-08: qty 5

## 2013-03-08 MED ORDER — KETOROLAC TROMETHAMINE 30 MG/ML IJ SOLN
30.0000 mg | Freq: Once | INTRAMUSCULAR | Status: AC
  Administered 2013-03-08: 30 mg via INTRAVENOUS

## 2013-03-08 MED ORDER — GLYCOPYRROLATE 0.2 MG/ML IJ SOLN
INTRAMUSCULAR | Status: AC
  Filled 2013-03-08: qty 1

## 2013-03-08 MED ORDER — MIDAZOLAM HCL 5 MG/5ML IJ SOLN
INTRAMUSCULAR | Status: DC | PRN
  Administered 2013-03-08: 2 mg via INTRAVENOUS

## 2013-03-08 MED ORDER — CEFAZOLIN SODIUM-DEXTROSE 2-3 GM-% IV SOLR
2.0000 g | INTRAVENOUS | Status: AC
  Administered 2013-03-08: 2 g via INTRAVENOUS

## 2013-03-08 MED ORDER — CHLORHEXIDINE GLUCONATE 4 % EX LIQD: 1.0000 "application " | Freq: Once | CUTANEOUS | Status: DC

## 2013-03-08 MED ORDER — 0.9 % SODIUM CHLORIDE (POUR BTL) OPTIME
TOPICAL | Status: DC | PRN
  Administered 2013-03-08: 1000 mL

## 2013-03-08 MED ORDER — PROPOFOL 10 MG/ML IV EMUL
INTRAVENOUS | Status: AC
  Filled 2013-03-08: qty 20

## 2013-03-08 MED ORDER — METHYLENE BLUE 1 % INJ SOLN
INTRAMUSCULAR | Status: AC
  Filled 2013-03-08: qty 1

## 2013-03-08 MED ORDER — LIDOCAINE HCL 1 % IJ SOLN
INTRAMUSCULAR | Status: DC | PRN
  Administered 2013-03-08: 50 mg via INTRADERMAL

## 2013-03-08 MED ORDER — LACTATED RINGERS IV SOLN
INTRAVENOUS | Status: DC
  Administered 2013-03-08 (×2): via INTRAVENOUS

## 2013-03-08 MED ORDER — PROPOFOL 10 MG/ML IV BOLUS
INTRAVENOUS | Status: DC | PRN
  Administered 2013-03-08: 100 mg via INTRAVENOUS

## 2013-03-08 MED ORDER — DEXAMETHASONE SODIUM PHOSPHATE 4 MG/ML IJ SOLN
4.0000 mg | Freq: Once | INTRAMUSCULAR | Status: AC
  Administered 2013-03-08: 4 mg via INTRAVENOUS

## 2013-03-08 MED ORDER — TECHNETIUM TC 99M SULFUR COLLOID FILTERED
0.5000 | Freq: Once | INTRAVENOUS | Status: AC | PRN
  Administered 2013-03-08: 0.5 via INTRADERMAL

## 2013-03-08 MED ORDER — ONDANSETRON HCL 4 MG/2ML IJ SOLN
INTRAMUSCULAR | Status: AC
  Filled 2013-03-08: qty 2

## 2013-03-08 MED ORDER — CEFAZOLIN SODIUM-DEXTROSE 2-3 GM-% IV SOLR
INTRAVENOUS | Status: AC
  Filled 2013-03-08: qty 50

## 2013-03-08 MED ORDER — HYDROCODONE-ACETAMINOPHEN 5-325 MG PO TABS: 1.0000 | ORAL_TABLET | ORAL | Status: DC | PRN

## 2013-03-08 MED ORDER — ONDANSETRON HCL 4 MG/2ML IJ SOLN: 4.0000 mg | Freq: Once | INTRAMUSCULAR | Status: DC | PRN

## 2013-03-08 SURGICAL SUPPLY — 42 items
ADH SKN CLS APL DERMABOND .7 (GAUZE/BANDAGES/DRESSINGS) ×1
APPLIER CLIP 11 MED OPEN (CLIP) ×2
APPLIER CLIP 9.375 SM OPEN (CLIP) ×2
APR CLP MED 11 20 MLT OPN (CLIP) ×1
APR CLP SM 9.3 20 MLT OPN (CLIP) ×1
BAG HAMPER (MISCELLANEOUS) ×2 IMPLANT
BNDG CMPR 82X61 PLY HI ABS (GAUZE/BANDAGES/DRESSINGS) ×1
BNDG CONFORM 6X.82 1P STRL (GAUZE/BANDAGES/DRESSINGS) ×2 IMPLANT
CLIP APPLIE 11 MED OPEN (CLIP) ×1 IMPLANT
CLIP APPLIE 9.375 SM OPEN (CLIP) ×1 IMPLANT
CLOTH BEACON ORANGE TIMEOUT ST (SAFETY) ×2 IMPLANT
COVER LIGHT HANDLE STERIS (MISCELLANEOUS) ×4 IMPLANT
COVER PROBE W GEL 5X96 (DRAPES) ×2 IMPLANT
DECANTER SPIKE VIAL GLASS SM (MISCELLANEOUS) ×2 IMPLANT
DERMABOND ADVANCED (GAUZE/BANDAGES/DRESSINGS) ×1
DERMABOND ADVANCED .7 DNX12 (GAUZE/BANDAGES/DRESSINGS) ×1 IMPLANT
DURAPREP 26ML APPLICATOR (WOUND CARE) ×2 IMPLANT
ELECT REM PT RETURN 9FT ADLT (ELECTROSURGICAL) ×2
ELECTRODE REM PT RTRN 9FT ADLT (ELECTROSURGICAL) ×1 IMPLANT
GLOVE BIO SURGEON STRL SZ7.5 (GLOVE) ×2 IMPLANT
GLOVE EXAM NITRILE LRG STRL (GLOVE) ×4 IMPLANT
GLOVE INDICATOR 7.0 STRL GRN (GLOVE) ×4 IMPLANT
GLOVE SS BIOGEL STRL SZ 6.5 (GLOVE) ×2 IMPLANT
GLOVE SUPERSENSE BIOGEL SZ 6.5 (GLOVE) ×2
GOWN STRL REIN XL XLG (GOWN DISPOSABLE) ×4 IMPLANT
KIT ROOM TURNOVER APOR (KITS) ×2 IMPLANT
MANIFOLD NEPTUNE II (INSTRUMENTS) ×2 IMPLANT
NEEDLE HYPO 25X1 1.5 SAFETY (NEEDLE) ×2 IMPLANT
NS IRRIG 1000ML POUR BTL (IV SOLUTION) ×2 IMPLANT
PACK MINOR (CUSTOM PROCEDURE TRAY) ×2 IMPLANT
PAD ARMBOARD 7.5X6 YLW CONV (MISCELLANEOUS) ×2 IMPLANT
SET BASIN LINEN APH (SET/KITS/TRAYS/PACK) ×2 IMPLANT
SPONGE GAUZE 2X2 8PLY STRL LF (GAUZE/BANDAGES/DRESSINGS) IMPLANT
SPONGE LAP 18X18 X RAY DECT (DISPOSABLE) ×2 IMPLANT
STOCKINETTE IMPERVIOUS LG (DRAPES) ×2 IMPLANT
STRIP CLOSURE SKIN 1/4X3 (GAUZE/BANDAGES/DRESSINGS) IMPLANT
SUT SILK 0 FSL (SUTURE) ×2 IMPLANT
SUT VIC AB 3-0 SH 27 (SUTURE) ×2
SUT VIC AB 3-0 SH 27X BRD (SUTURE) ×1 IMPLANT
SUT VIC AB 4-0 PS2 27 (SUTURE) ×2 IMPLANT
SYR CONTROL 10ML LL (SYRINGE) ×2 IMPLANT
YANKAUER SUCT BULB TIP NO VENT (SUCTIONS) ×2 IMPLANT

## 2013-03-08 NOTE — Transfer of Care (Signed)
Immediate Anesthesia Transfer of Care Note  Patient: Tamara [name]  Procedure(s) Performed: Procedure(s) with comments: PARTIAL MASTECTOMY WITH NEEDLE LOCALIZATION AND AXILLARY SENTINEL LYMPH NODE BX (Right) - Sentinel Node Bx @ 7:30am Needle Loc @ 8:00am  Patient Location: PACU  Anesthesia Type:General  Level of Consciousness: awake, alert  and patient cooperative  Airway & Oxygen Therapy: Patient Spontanous Breathing and Patient connected to face mask oxygen  Post-op Assessment: Report given to PACU RN, Post -op Vital signs reviewed and stable and Patient moving all extremities  Post vital signs: Reviewed and stable  Complications: No apparent anesthesia complications

## 2013-03-08 NOTE — OR Nursing (Signed)
Spoke with Tamara [name] to let her know that we were ready to take her for her sentinel node injection at 0730

## 2013-03-08 NOTE — Anesthesia Postprocedure Evaluation (Signed)
  Anesthesia Post-op Note  Patient: Tamara [name]  Procedure(s) Performed: Procedure(s) with comments: PARTIAL MASTECTOMY WITH NEEDLE LOCALIZATION AND AXILLARY SENTINEL LYMPH NODE BX (Right) - Sentinel Node Bx @ 7:30am Needle Loc @ 8:00am  Patient Location: PACU  Anesthesia Type:General  Level of Consciousness: awake, alert , oriented and patient cooperative  Airway and Oxygen Therapy: Patient Spontanous Breathing  Post-op Pain: 2 /10, mild  Post-op Assessment: Post-op Vital signs reviewed, Patient's Cardiovascular Status Stable, Respiratory Function Stable, Patent Airway, No signs of Nausea or vomiting and Pain level controlled  Post-op Vital Signs: Reviewed and stable  Complications: No apparent anesthesia complications

## 2013-03-08 NOTE — Interval H&P Note (Signed)
History and Physical Interval Note:  03/08/2013 9:22 AM  Tamara [name]  has presented today for surgery, with the diagnosis of right breast cancer  The various methods of treatment have been discussed with the patient and family. After consideration of risks, benefits and other options for treatment, the patient has consented to  Procedure(s) with comments: PARTIAL MASTECTOMY WITH NEEDLE LOCALIZATION AND AXILLARY SENTINEL LYMPH NODE BX (Right) - Sentinel Node Bx @ 7:30am Needle Loc @ 8:00am as a surgical intervention .  The patient's history has been reviewed, patient examined, no change in status, stable for surgery.  I have reviewed the patient's chart and labs.  Questions were answered to the patient's satisfaction.     Franky Macho A

## 2013-03-08 NOTE — OR Nursing (Signed)
Sentinel node injection at 0740 to mammography at 0750 for needle loc

## 2013-03-08 NOTE — Anesthesia Preprocedure Evaluation (Signed)
Anesthesia Evaluation  Patient identified by MRN, date of birth, ID band Patient awake    Reviewed: Allergy & Precautions, H&P , NPO status , Patient's Chart, lab work & pertinent test results  Airway Mallampati: I TM Distance: >3 FB     Dental  (+) Edentulous Upper and Edentulous Lower   Pulmonary former smoker,  breath sounds clear to auscultation        Cardiovascular hypertension, Pt. on medications Rhythm:Regular Rate:Normal     Neuro/Psych    GI/Hepatic negative GI ROS,   Endo/Other    Renal/GU      Musculoskeletal   Abdominal   Peds  Hematology   Anesthesia Other Findings   Reproductive/Obstetrics                           Anesthesia Physical Anesthesia Plan  ASA: II  Anesthesia Plan: General   Post-op Pain Management:    Induction: Intravenous  Airway Management Planned: Oral ETT  Additional Equipment:   Intra-op Plan:   Post-operative Plan: Extubation in OR  Informed Consent: I have reviewed the patients History and Physical, chart, labs and discussed the procedure including the risks, benefits and alternatives for the proposed anesthesia with the patient or authorized representative who has indicated his/her understanding and acceptance.     Plan Discussed with:   Anesthesia Plan Comments:         Anesthesia Quick Evaluation

## 2013-03-08 NOTE — Op Note (Signed)
Patient:  Tamara [name]  DOB:  1934-02-26  MRN:  [id number]   Preop Diagnosis:  Invasive right breast cancer, clinical stage I  Postop Diagnosis:  Same  Procedure:  Right partial mastectomy after needle localization, sentinel lymph node biopsy  Surgeon:  Franky Macho, M.D.  Anes:  General endotracheal  Indications:  Patient is a 79 year old white female who was found on routine mammography to have an approximately 8 mm invasive carcinoma of the right breast. After extensive discussion with the patient, she was elected to proceed with a right partial mastectomy after needle localization, sentinel lymph node biopsy. The risks and benefits of the procedure including bleeding, infection, nerve injury, and the possibility of needing further surgery do to incomplete margins were fully explained to the patient, who gave informed consent.  Procedure note:  The patient is placed the supine position. She is already undergone nuclear medicine injection as well as needle localization. After induction of general endotracheal anesthesia, 3 cc of dilute blue dye was injected into the right breast massaged for 5 minutes. The right breast and axilla were then prepped and draped using usual sterile technique with DuraPrep. Surgical site confirmation was performed.  Using the neoprobe, a single enlarged right axillary lymph node was found. The skin counts were 320, in vivo count 365, ex vivo count 1600. The lymph node did not appear blue. Basin count was 20. A frozen section of this lymph node was negative for malignancy. The wound is irrigated normal saline. A bleeding was controlled using small clips. The wound was injected with 0.5% Sensorcaine. The subcutaneous layer was reapproximated using 3-0 Vicryl interrupted suture. The skin was closed using a 4-0 Vicryl subcuticular suture.  Next, the right partial mastectomy was performed. The needle was located in the upper, outer quadrant of the right breast. An  incision was made to incorporate the guidewire. The dissection was taken down widely in order to remove the suspicious area of normal tissue surrounding it. A short suture was placed superiorly and a long suture placed laterally for orientation purposes. The specimen was sent to radiology. Specimen radiography revealed the cancer to be within the specimen removed. He was then sent to pathology further examination. The wound is irrigated normal saline. Any bleeding was controlled using Bovie electrocautery. The skin was closed using a 4-0 Vicryl subcuticular suture. 0.5% Sensorcaine was instilled the surrounding wound. Both incisions were closed using Dermabond.  All tape and needle counts were correct at the end of the procedure. Patient was extubated in the operating room and transferred to PACU in stable condition.  Complications:  None  EBL:  Minimal  Specimen:  Right sentinel axillary lymph node, right breast tissue (short suture superior, long suture lateral)

## 2013-03-08 NOTE — Anesthesia Procedure Notes (Signed)
Procedure Name: Intubation Date/Time: 03/08/2013 9:54 AM Performed by: Despina Hidden Pre-anesthesia Checklist: Emergency Drugs available, Suction available, Patient being monitored and Patient identified Patient Re-evaluated:Patient Re-evaluated prior to inductionOxygen Delivery Method: Circle system utilized Preoxygenation: Pre-oxygenation with 100% oxygen Intubation Type: IV induction Ventilation: Mask ventilation without difficulty Laryngoscope Size: Mac and 3 Grade View: Grade I Tube type: Oral Tube size: 7.0 mm Number of attempts: 1 Airway Equipment and Method: Stylet Placement Confirmation: ETT inserted through vocal cords under direct vision,  positive ETCO2 and breath sounds checked- equal and bilateral Secured at: 22 cm Tube secured with: Tape Dental Injury: Teeth and Oropharynx as per pre-operative assessment

## 2013-03-13 ENCOUNTER — Encounter (HOSPITAL_COMMUNITY): Payer: Self-pay | Admitting: General Surgery

## 2013-03-21 ENCOUNTER — Encounter (HOSPITAL_COMMUNITY): Payer: Self-pay

## 2013-03-21 ENCOUNTER — Encounter (HOSPITAL_COMMUNITY): Payer: Medicare Other | Attending: Hematology and Oncology

## 2013-03-21 ENCOUNTER — Ambulatory Visit (HOSPITAL_COMMUNITY): Payer: Medicare Other

## 2013-03-21 VITALS — BP 127/74 | HR 92 | Temp 98.3°F | Resp 16 | Ht 62.25 in | Wt 162.3 lb

## 2013-03-21 DIAGNOSIS — Z170 Estrogen receptor positive status [ER+]: Secondary | ICD-10-CM

## 2013-03-21 DIAGNOSIS — M810 Age-related osteoporosis without current pathological fracture: Secondary | ICD-10-CM

## 2013-03-21 DIAGNOSIS — C50911 Malignant neoplasm of unspecified site of right female breast: Secondary | ICD-10-CM

## 2013-03-21 DIAGNOSIS — C50919 Malignant neoplasm of unspecified site of unspecified female breast: Secondary | ICD-10-CM

## 2013-03-21 NOTE — Progress Notes (Addendum)
Patient History and Physical   Tamara [name] [id number] 1934-03-11 79 y.o. 03/21/2013  Referring MD: Dr Franky Macho.  Primary MD: Sheppard Penton.  Chief Complaint: Breast cancer   HPI:  Dear Colleagues;  I had the pleasure of seeing Tamara [name] today at our clinic. She was accompanied by her daughter [name] also works for  [employer] in [place]. I have reviewed the available records. Please permit me to rehearse her history for my records. As you recall ,she is a 79 year old woman who was noted on routine mammogram to have a suspicious lesion in the right breast. She had ultrasound  of the right breast done 01/04/2013 that showed 7x 4 x 8 mm lesion at the 11:00 position of the right breast. She had a core biopsy of this lesion on 01/11/2013 and this showed invasive ductal carcinoma with extracellular mucin. Hormone receptors were as follows; Estrogen Receptor: 100%, POSITIVE, STRONG STAINING INTENSITY Progesterone Receptor: 85%, POSITIVE, STRONG STAINING INTENSITY Proliferation Marker Ki67: 17% HER-2/neu was non amplified. Patient then underwent a lumpectomy and 03/08/2013 with sentinel lymph node biopsy , pathology report is as follows; 1. Lymph node, sentinel, biopsy, right axillary - ONE BENIGN LYMPH NODE, NEGATIVE FOR CARCINOMA (0/1). 2. Breast, lumpectomy, right breast tissue - INVASIVE MUCINOUS CARCINOMA, 0.6 CM, NOTTINGHAM COMBINED HISTOLOGIC GRADE I. 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 - RESECTION MARGINS, NEGATIVE FOR ATYPIA OR MALIGNANCY. Pathologic staging was as follows: TNM: pT1b, pN0 (sn-)  Patient tells me she is recovering nicely from surgery without any complaints at this time.  She is here to discuss adjuvant therapy.  PMH: Past Medical History  Diagnosis Date  . Hypertension   . Cancer   . Claustrophobia   . Claustrophobia   . Breast cancer     Past Surgical History  Procedure Laterality Date  . Abdominal  hysterectomy    . Cataract extraction, bilateral    . Partial mastectomy with needle localization and axillary sentinel lymph node bx Right 03/08/2013    Procedure: PARTIAL MASTECTOMY WITH NEEDLE LOCALIZATION AND AXILLARY SENTINEL LYMPH NODE BX;  Surgeon: Dalia Heading, MD;  Location: [facility];  Service: General;  Laterality: Right;  Sentinel Node Bx @ 7:30am Needle Loc @ 8:00am    Allergies: No Known Allergies  Medications: Current outpatient prescriptions:ALPRAZolam (XANAX) 0.5 MG tablet, Take 0.5 mg by mouth 3 (three) times daily as needed for sleep., Disp: , Rfl: ;  amLODipine (NORVASC) 5 MG tablet, Take 5 mg by mouth daily., Disp: , Rfl: ;  loratadine (CLARITIN) 10 MG tablet, Take 10 mg by mouth daily as needed for allergies., Disp: , Rfl: ;  raloxifene (EVISTA) 60 MG tablet, Take 60 mg by mouth daily., Disp: , Rfl:  HYDROcodone-acetaminophen (NORCO) 5-325 MG per tablet, Take 1 tablet by mouth every 4 (four) hours as needed for pain., Disp: 40 tablet, Rfl: 0   Social History:   reports that she quit smoking about 46 years ago. Her smoking use included Cigarettes. She has a 20 pack-year smoking history. She has never used smokeless tobacco. She reports that she does not drink alcohol or use illicit drugs. Occasional ETOH. Lives with daughter and son in-law and grand child.  Family History: Family History  Problem Relation Age of Onset  . Diabetes Mother   . Cancer Father   . Cancer Sister   . Cancer Brother   Lung Cancer in Sister.Does not recall type of cancer Dad had Brother had colon cancer.  GYN  HX: Menarche at 14 years, menopause 89s.No hx of OCP use. Age of 1 st pregnancy 20's. 2 children.  Review of Systems: 14 point review of system is as in the history above otherwise negative.   Physical Exam: Blood pressure 127/74, pulse 92, temperature 98.3 F (36.8 C), temperature source Oral, resp. rate 16, height 5' 2.25" (1.581 m), weight 162 lb 4.8 oz (73.619 kg). GENERAL: No  distress, . Elderly woman. SKIN:  No rashes or significant lesions , no ecchymosis or petechia or rash. HEAD: Normocephalic, No masses, lesions, tenderness or abnormalities  EYES: Conjunctiva are pink , non-injected and no jaundice. ENT: External ears normal ,lips, buccal mucosa, and tongue normal and mucous membranes are moist . LYMPH: No palpable lymphadenopathy,  In the neck supraclavicular area or axilla. BREAST: Freshly healing surgical  right upper breast scar and axillary scar.  I was slight erythema around the breast scar but no discharge or evidence of infection.  Scars were also non tender. No palpable masses in the right breast.  Also no masses  or abnormality felt in the left breast. LUNGS: Clear to auscultation , no crackles or wheezes HEART: regular rate & rhythm, no murmurs, no gallops, S1 normal and S2 normal  ABDOMEN: Abdomen soft, non-tender, normal bowel sounds, no masses or organomegaly and no hepatosplenomegaly palpable.  EXTREMITIES: No edema, no skin discoloration or tenderness. NEURO: Alert & oriented.     Lab Results: Lab Results  Component Value Date   WBC 6.3 03/02/2013   HGB 11.7* 03/02/2013   HCT 35.7* 03/02/2013   MCV 95.5 03/02/2013   PLT 220 03/02/2013     Chemistry      Component Value Date/Time   NA 139 03/02/2013 1315   K 4.1 03/02/2013 1315   CL 104 03/02/2013 1315   CO2 28 03/02/2013 1315   BUN 13 03/02/2013 1315   CREATININE 0.76 03/02/2013 1315      Component Value Date/Time   CALCIUM 8.6 03/02/2013 1315   ALKPHOS 60 03/02/2013 1315   AST 19 03/02/2013 1315   ALT 15 03/02/2013 1315   BILITOT 0.3 03/02/2013 1315         Impression: Mrs. Tamara [name]  has a T1, N0, M0 invasive ductal carcinoma of the right breast ER/PR positive HER-2/neu negative.  Her cancer is  Grade 1 and sentinel node biopsy is negative.  According to the Adjuvantonline model her absolute mortality benefit  from chemotherapy is less than 1% in 10 years.  However the NCCN guidelines do  recommend Oncotype DX tests for her stage and histology. She has baseline osteoporosis.  Her last DEXA scan was than 2 years ago.  Recommendations: 1. I ordered  Oncotype DX test.  2. I referred her to radiation oncology for evaluation.  We discussed CALGB study  Data NEJM 2004 which shows that the radiation may not be necessary in an elderly woman with early stage breast cancer.  3.  I ordered a baseline DEXA scan given intention to recommend AI. She has baseline osteoporosis which could make AI therapy challenging.  If her osteoporosis is getting worse bisphosphonate therapy may be warranted.  Also noted is that patient is on Evista.  4. She will return to clinic in 4 weeks, hopefully at that time she had seen radiation oncology and Oncotype score back.  If patient's score is low or intermediate , then this would make a strong case for no adjuvant chemotherapy .I have a suspicion that she would not require adjuvant chemotherapy .  All questions were satisfactorily answered.She knows to call if she has any concern.  I spent 50% of the time was spent counseling the patient face to face. The total time spent in the appointment was 60 minutes.   Sherral Hammers, MD FACP. Hematology/Oncology.    Thanks for this kind referral.  . Addendum: I  received on 03/29/2013 result of  Oncotype DX ; score of 20 with 13 percent  10 year Risk of distant recurrence .  Absolute benefit of chemotherapy at 10 years in this risk category is negligible and less than 1%.   Therefore I do not think that this patient is a candidate for chemotherapy and can be treated there with endocrine therapy alone.

## 2013-03-21 NOTE — Patient Instructions (Addendum)
[facility] Discharge Instructions  RECOMMENDATIONS MADE BY THE CONSULTANT AND ANY TEST RESULTS WILL BE SENT TO YOUR REFERRING PHYSICIAN.  EXAM FINDINGS BY THE PHYSICIAN TODAY AND SIGNS OR SYMPTOMS TO REPORT TO CLINIC OR PRIMARY PHYSICIAN: Exam and discussion by MD.  Will have your tumor tested for Oncotyping to determine your risk of recurrence or developing other cancers and to see if we need to do chemotherapy or hormonal therapy. Will make referral for Radiation consult to get their recommendations. We need to get a Bone Density Study to make sure your bones are ok.  MEDICATIONS PRESCRIBED:  none   SPECIAL INSTRUCTIONS/FOLLOW-UP: Follow-up in 3-4 weeks.  Thank you for choosing [facility] to provide your oncology and hematology care.  To afford each patient quality time with our providers, please arrive at least 15 minutes before your scheduled appointment time.  With your help, our goal is to use those 15 minutes to complete the necessary work-up to ensure our physicians have the information they need to help with your evaluation and healthcare recommendations.    Effective January 1st, 2014, we ask that you re-schedule your appointment with our physicians should you arrive 10 or more minutes late for your appointment.  We strive to give you quality time with our providers, and arriving late affects you and other patients whose appointments are after yours.    Again, thank you for choosing [facility].  Our hope is that these requests will decrease the amount of time that you wait before being seen by our physicians.       _____________________________________________________________  Should you have questions after your visit to [facility], please contact our office at [phone] between the hours of 8:30 a.m. and 5:00 p.m.  Voicemails left after 4:30 p.m. will not be returned until the following business day.  For  prescription refill requests, have your pharmacy contact our office with your prescription refill request.

## 2013-03-24 ENCOUNTER — Ambulatory Visit (HOSPITAL_COMMUNITY)
Admission: RE | Admit: 2013-03-24 | Discharge: 2013-03-24 | Disposition: A | Discharge status: Home / Self Care | Payer: Medicare Other | Source: Ambulatory Visit | Attending: Hematology and Oncology | Admitting: Hematology and Oncology

## 2013-03-24 DIAGNOSIS — Z780 Asymptomatic menopausal state: Secondary | ICD-10-CM | POA: Insufficient documentation

## 2013-03-24 DIAGNOSIS — M810 Age-related osteoporosis without current pathological fracture: Secondary | ICD-10-CM

## 2013-03-24 DIAGNOSIS — M818 Other osteoporosis without current pathological fracture: Secondary | ICD-10-CM | POA: Insufficient documentation

## 2013-04-24 ENCOUNTER — Other Ambulatory Visit (HOSPITAL_COMMUNITY): Payer: Self-pay | Admitting: Oncology

## 2013-04-24 ENCOUNTER — Telehealth (HOSPITAL_COMMUNITY): Payer: Self-pay

## 2013-04-24 ENCOUNTER — Telehealth (HOSPITAL_COMMUNITY): Payer: Self-pay | Admitting: Hematology and Oncology

## 2013-04-24 NOTE — Telephone Encounter (Signed)
Patient is willing to do prolia injections.  Wants to do needed labs tomorrow at appointment and come back on Wednesday for injection.

## 2013-04-24 NOTE — Telephone Encounter (Signed)
[employer] HMO [phone] ? IF PROLIA NEEDS AUTH PER ANDREA NO AUTH IS REQUIRED  OOP 3400 $1266.96 HAS BEEN MET 80/20 PLAN REF#[id number]

## 2013-04-24 NOTE — Telephone Encounter (Signed)
Discussed with patient and her daughter possibility of starting Prolia injections 2 times year and to stop Evista due to results of most recent bone density.  Patient will think it over and will call back with her decision.

## 2013-04-25 ENCOUNTER — Encounter (HOSPITAL_COMMUNITY): Payer: Self-pay | Admitting: Oncology

## 2013-04-25 ENCOUNTER — Encounter (HOSPITAL_COMMUNITY): Payer: Medicare Other | Attending: Hematology and Oncology | Admitting: Oncology

## 2013-04-25 VITALS — BP 116/71 | HR 83 | Temp 98.2°F | Resp 16 | Wt 158.9 lb

## 2013-04-25 DIAGNOSIS — C50919 Malignant neoplasm of unspecified site of unspecified female breast: Secondary | ICD-10-CM | POA: Insufficient documentation

## 2013-04-25 DIAGNOSIS — C50419 Malignant neoplasm of upper-outer quadrant of unspecified female breast: Secondary | ICD-10-CM

## 2013-04-25 DIAGNOSIS — M810 Age-related osteoporosis without current pathological fracture: Secondary | ICD-10-CM | POA: Insufficient documentation

## 2013-04-25 DIAGNOSIS — C50911 Malignant neoplasm of unspecified site of right female breast: Secondary | ICD-10-CM

## 2013-04-25 DIAGNOSIS — Z170 Estrogen receptor positive status [ER+]: Secondary | ICD-10-CM

## 2013-04-25 HISTORY — DX: Malignant neoplasm of unspecified site of right female breast: C50.911

## 2013-04-25 HISTORY — DX: Malignant neoplasm of upper-outer quadrant of unspecified female breast: C50.419

## 2013-04-25 LAB — COMPREHENSIVE METABOLIC PANEL
AST: 19 U/L (ref 0–37)
Albumin: 3.8 g/dL (ref 3.5–5.2)
BUN: 13 mg/dL (ref 6–23)
Calcium: 9 mg/dL (ref 8.4–10.5)
Creatinine, Ser: 0.86 mg/dL (ref 0.50–1.10)

## 2013-04-25 LAB — CBC WITH DIFFERENTIAL/PLATELET
Basophils Absolute: 0 10*3/uL (ref 0.0–0.1)
Basophils Relative: 0 % (ref 0–1)
Eosinophils Absolute: 0.3 10*3/uL (ref 0.0–0.7)
Eosinophils Relative: 4 % (ref 0–5)
HCT: 37 % (ref 36.0–46.0)
MCH: 30.7 pg (ref 26.0–34.0)
MCHC: 32.7 g/dL (ref 30.0–36.0)
MCV: 93.9 fL (ref 78.0–100.0)
Monocytes Absolute: 0.6 10*3/uL (ref 0.1–1.0)
RDW: 13.3 % (ref 11.5–15.5)

## 2013-04-25 MED ORDER — EXEMESTANE 25 MG PO TABS: 25.0000 mg | ORAL_TABLET | Freq: Every day | ORAL | Status: DC

## 2013-04-25 MED ORDER — CALCIUM CARBONATE-VITAMIN D 500-200 MG-UNIT PO TABS: 2.0000 | ORAL_TABLET | Freq: Every day | ORAL | Status: DC

## 2013-04-25 NOTE — Progress Notes (Signed)
Tamara [name] presented for [employer]. Labs per MD order drawn via Peripheral Line 23 gauge needle inserted in left Ac  Good blood return present. Procedure without incident.  Needle removed intact. Patient tolerated procedure well.

## 2013-04-25 NOTE — Patient Instructions (Addendum)
[facility] Discharge Instructions  RECOMMENDATIONS MADE BY THE CONSULTANT AND ANY TEST RESULTS WILL BE SENT TO YOUR REFERRING PHYSICIAN.  EXAM FINDINGS BY THE PHYSICIAN TODAY AND SIGNS OR SYMPTOMS TO REPORT TO CLINIC OR PRIMARY PHYSICIAN: Exam and findings as discussed by Dellis Anes PA-C.  Will check blood work today and if your chemistries are ok we will start you on Prolia for your osteoporosis.  You will get this injection every 6 months.  MEDICATIONS PRESCRIBED:  Stop the Evista Aromasin take 1 daily - start on 04/28/13  INSTRUCTIONS/FOLLOW-UP: Follow-up in 4 - 6 weeks.  Thank you for choosing [facility] to provide your oncology and hematology care.  To afford each patient quality time with our providers, please arrive at least 15 minutes before your scheduled appointment time.  With your help, our goal is to use those 15 minutes to complete the necessary work-up to ensure our physicians have the information they need to help with your evaluation and healthcare recommendations.    Effective January 1st, 2014, we ask that you re-schedule your appointment with our physicians should you arrive 10 or more minutes late for your appointment.  We strive to give you quality time with our providers, and arriving late affects you and other patients whose appointments are after yours.    Again, thank you for choosing [facility].  Our hope is that these requests will decrease the amount of time that you wait before being seen by our physicians.       _____________________________________________________________  Should you have questions after your visit to [facility], please contact our office at [phone] between the hours of 8:30 a.m. and 5:00 p.m.  Voicemails left after 4:30 p.m. will not be returned until the following business day.  For prescription refill requests, have your pharmacy contact our office with your  prescription refill request.     Exemestane tablets What is this medicine? EXEMESTANE (ex e MES tane) blocks the production of the hormone estrogen. Some types of breast cancer depend on estrogen to grow, and this medicine can stop tumor growth by blocking estrogen production. This medicine is for the treatment of breast cancer in postmenopausal women only. This medicine may be used for other purposes; ask your health care provider or pharmacist if you have questions. What should I tell my health care provider before I take this medicine? They need to know if you have any of these conditions: -an unusual or allergic reaction to exemestane, other medicines, foods, dyes, or preservatives -pregnant or trying to get pregnant -breast-feeding How should I use this medicine? Take this medicine by mouth with a glass of water. Follow the directions on the prescription label. Take your doses at regular intervals after a meal. Do not take your medicine more often than directed. Do not stop taking except on the advice of your doctor or health care professional. Contact your pediatrician regarding the use of this medicine in children. Special care may be needed. Overdosage: If you think you have taken too much of this medicine contact a poison control center or emergency room at once. NOTE: This medicine is only for you. Do not share this medicine with others. What if I miss a dose? If you miss a dose, take the next dose as usual. Do not try to make up the missed dose. Do not take double or extra doses. What may interact with this medicine? Do not take this medicine  with any of the following medications: -female hormones, like estrogens and birth control pills This medicine may also interact with the following medications: -androstenedione -phenytoin -rifabutin, rifampin, or rifapentine -St. John's Wort This list may not describe all possible interactions. Give your health care provider a list of all  the medicines, herbs, non-prescription drugs, or dietary supplements you use. Also tell them if you smoke, drink alcohol, or use illegal drugs. Some items may interact with your medicine. What should I watch for while using this medicine? Visit your doctor or health care professional for regular checks on your progress. If you experience hot flashes or sweating while taking this medicine, avoid alcohol, smoking and drinks with caffeine. This may help to decrease these side effects. What side effects may I notice from receiving this medicine? Side effects that you should report to your doctor or health care professional as soon as possible: -any new or unusual symptoms -changes in vision -fever -leg or arm swelling -pain in bones, joints, or muscles -pain in hips, back, ribs, arms, shoulders, or legs Side effects that usually do not require medical attention (report to your doctor or health care professional if they continue or are bothersome): -difficulty sleeping -headache -hot flashes -sweating -unusually weak or tired This list may not describe all possible side effects. Call your doctor for medical advice about side effects. You may report side effects to FDA at 1-800-FDA-1088. Where should I keep my medicine? Keep out of the reach of children. Store at room temperature between 15 and 30 degrees C (59 and 86 degrees F). Throw away any unused medicine after the expiration date. NOTE: This sheet is a summary. It may not cover all possible information. If you have questions about this medicine, talk to your doctor, pharmacist, or health care provider.  2013, [employer]vier/[employer] Standard. (11/08/2007 11:48:29 AM)

## 2013-04-25 NOTE — Progress Notes (Signed)
Tamara Perches, MD [address] [place] [address]  Breast cancer, right breast - Plan: CBC with Differential, Comprehensive metabolic panel, exemestane (AROMASIN) 25 MG tablet, CBC with Differential, Comprehensive metabolic panel  OSTEOPOROSIS  CURRENT THERAPY: To start Aromasin on 04/28/2013 which she will take for 5 years.  Also on Prolia every 6 months beginning on 04/26/2013.  INTERVAL HISTORY: Tamara [name] 79 y.o. female returns for  regular  visit for followup of  Stage IA (T1b, N0, M0) invasive mucinous breast carcinoma, S/P right breast lumpectomy by Dr. Lovell [name] on 03/08/2013 with 1 negative sentinel node.  Cancer was identified as ER+ 100%, PR+ 85%, Her2 negative, and Ki-67 marker at 17%.  Oncotype Dx score of 20 with 13% 10 year risk of distant recurrence and absolute benefit of chemotherapy at 10 years in this risk category is negligible and less than 1%.   I personally reviewed and went over pathology results with the patient.  I reviewed her diagnosis and stage of IA.  With this stage and age greater than 34, radiation therapy is not indicated, but she was seen by Dr. Thersa [name] (Rad Onc), who agreed with this assumption and therefore she has not received radiation therapy.   I personally reviewed and went over radiographic studies with the patient.  Her DEXA scan was worse with regards to her osteoporosis.  As a result, I have asked her to D/C Evista.  I have offered her other osteoporosis options including reclast and prolia.  We discussed the risks, benefits, alternatives and side effects of therapy of both.  We discussed logistics of each options.  She has agreed to pursue Prolia since this will require little time in the clinic.  Therefore, we will check labs today and pursue Prolia tomorrow.  Consent was ascertained and the patient was provided information regarding the injection.  Additionally, I will provide her an Rx for Oscal 2 tabs daily.    We discussed  endocrine therapy for her breast cancer.  I reviewed aromatase inhibitors and Tamoxifen therapy with the patient.  We discussed side effects.  She is S/P oophorectomy in the distant past but she does have her uterus.  We therefore focused on AI therapy.  She was educated regarding the potential for AI therapy to worsen osteoporosis, and therefore we may need to switch to Tamoxifen in the future if necessary.  I discussed the categorical side effects of endocrine therapy including hot flashes, arthralgias, and myalgias.   Additionally, a print-out of the medication information was provided to the patient at time of discharge from [facility] and this information was imported into his discharge instructions. She will start on 04/28/2013.  I stressed importance of compliance of the medication.  She will call us with any problems.  Oncologically, she denies any complaints and ROS questioning is negative.   Past Medical History  Diagnosis Date  . Hypertension   . Cancer   . Claustrophobia   . Claustrophobia   . Breast cancer   . Osteoporosis   . Breast cancer, right breast 04/25/2013    has OSTEOPOROSIS; COUGH, CHRONIC; and Breast cancer, right breast on her problem list.     has No Known Allergies.  Tamara [name] does not currently have medications on file.  Past Surgical History  Procedure Laterality Date  . Abdominal hysterectomy    . Cataract extraction, bilateral    . Partial mastectomy with needle localization and axillary sentinel lymph node bx Right 03/08/2013  Procedure: PARTIAL MASTECTOMY WITH NEEDLE LOCALIZATION AND AXILLARY SENTINEL LYMPH NODE BX;  Surgeon: Tamara Heading, MD;  Location: [facility];  Service: General;  Laterality: Right;  Sentinel Node Bx @ 7:30am Needle Loc @ 8:00am    Denies any headaches, dizziness, double vision, fevers, chills, night sweats, nausea, vomiting, diarrhea, constipation, chest pain, heart palpitations, shortness of breath, blood in stool, black tarry  stool, urinary pain, urinary burning, urinary frequency, hematuria.   PHYSICAL EXAMINATION  ECOG PERFORMANCE STATUS: 0 - Asymptomatic  Filed Vitals:   04/25/13 1449  BP: 116/71  Pulse: 83  Temp: 98.2 F (36.8 C)  Resp: 16    GENERAL:alert, no distress, well nourished, well developed, comfortable, cooperative and smiling SKIN: skin color, texture, turgor are normal, no rashes or significant lesions HEAD: Normocephalic, No masses, lesions, tenderness or abnormalities EYES: normal, PERRLA, EOMI, Conjunctiva are pink and non-injected EARS: External ears normal OROPHARYNX:mucous membranes are moist  NECK: supple, trachea midline LYMPH:  not examined BREAST:not examined LUNGS: not examined HEART: not examined ABDOMEN:not examined BACK: not examined EXTREMITIES:no joint deformities, effusion, or inflammation, no skin discoloration  NEURO: alert & oriented x 3 with fluent speech, no focal motor/sensory deficits, gait normal    PATHOLOGY:  03/08/2013  ADDITIONAL INFORMATION: 2. A sample (block 2E) was sent to [facility] for Oncotype testing. The patient's recurrence score is 20. Those patients who had a recurrence score of 20 had an average rate of distant recurrence of 13%. Pecola Leisure MD Pathologist, Electronic Signature ( Signed 03/29/2013) 2. CHROMOGENIC IN-SITU HYBRIDIZATION Results: HER-2/NEU BY CISH - NO AMPLIFICATION OF HER-2 DETECTED. RESULT RATIO OF HER2: CEP 17 SIGNALS 1.10 AVERAGE HER2 COPY NUMBER PER CELL 1.60 REFERENCE RANGE NEGATIVE HER2/Chr17 Ratio <2.0 and Average HER2 copy number <4.0 EQUIVOCAL HER2/Chr17 Ratio <2.0 and Average HER2 copy number 4.0 and <6.0 POSITIVE HER2/Chr17 Ratio >=2.0 and/or Average HER2 copy number >=6.0 Pecola Leisure MD Pathologist, Electronic Signature ( Signed 03/15/2013) FINAL DIAGNOSIS Diagnosis 1. Lymph node, sentinel, biopsy, right axillary - ONE BENIGN LYMPH NODE, NEGATIVE FOR CARCINOMA (0/1). 1 of 3 FINAL for  Tamara [name] [phone]) Diagnosis(continued) 2. Breast, lumpectomy, right breast tissue - INVASIVE MUCINOUS CARCINOMA, 0.6 CM, NOTTINGHAM COMBINED HISTOLOGIC GRADE I. 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 - RESECTION MARGINS, NEGATIVE FOR ATYPIA OR MALIGNANCY. Microscopic Comment 2. BREAST, INVASIVE TUMOR, WITH LYMPH NODE SAMPLING Specimen, including laterality: Right breast Procedure: Lumpectomy Histologic type: Invasive mucinous carcinoma Grade: I Tubule formation: 2 Nuclear pleomorphism: 1 Mitotic: 1 Tumor size (glass slide measurement): 0.6 cm Margins: Negative Invasive, distance to closest margin: Lateral margin, 0.6 cm In-situ, distance to closest margin: Lateral margin, 0.6 cm Lymphovascular invasion: Not identified Ductal carcinoma in situ: Present Grade: Intermediate grade Extensive intraductal component: Present Lobular neoplasia: N/A Tumor focality: Unifocal Treatment effect: N/A Extent of tumor: Skin: N/A Nipple: N/A Skeletal muscle: N/A Lymph nodes: # examined: 1 Lymph nodes with metastasis: 0 Isolated tumor cells (< 0.2 mm): 0 Micrometastasis: (> 0.2 mm and < 2.0 mm): 0 Macrometastasis: (> 2.0 mm): 0 Extracapsular extension: 0 Breast prognostic profile: Please correlate with previous case [id number] Estrogen receptor: 100%, positive, strong staining intensity Progesterone receptor: 85% positive, strong staining intensity Her 2 neu: No amplification of Her 2 neu detected. The ratio of Her 2:CEP 17 signals is 1.46. The average Her 2 copy number per cell is 1.75 Ki-67: 17% Non-neoplastic breast: Unremarkable. TNM: pT1b, pN0 (sn-) Abigail Miyamoto MD Pathologist, Electronic Signature (Case signed 03/10/2013)  ASSESSMENT:  1. Stage IA (T1b, N0, M0) invasive mucinous breast carcinoma, S/P right breast lumpectomy by Dr. Lovell [name] on 03/08/2013 with 1 negative sentinel node.  Cancer was identified as ER+ 100%, PR+ 85%, Her2  negative, and Ki-67 marker at 17%.  Oncotype Dx score of 20 with 13% 10 year risk of distant recurrence and absolute benefit of chemotherapy at 10 years in this risk category is negligible and less than 1%.  She will start Aromasin on 04/28/2013 and she will take for 5 years. 2. Osteoporosis, worsening.  Will switch to Prolia every 6 months with Oscal 2 tabs daily.   Patient Active Problem List   Diagnosis Date Noted  . Breast cancer, right breast 04/25/2013  . OSTEOPOROSIS 08/10/2007  . COUGH, CHRONIC 06/28/2007    PLAN:  1. I personally reviewed and went over radiographic studies with the patient. 2. I personally reviewed and went over laboratory results with the patient. 3. I personally reviewed and went over pathology results with the patient. 4. D/C Evista 5. Prolia every 6 months beginning tomorrow with Oscal 2 tabs daily.  Supportive therapy plan developed. 6. Labs today: CBC diff, CMET 7. Discussion regarding OncoType DX score 8. Discussion regarding Aromasin.  Patient education regarding risks, benefits, alternatives, and side effects of Aromasin, particularly Hot flashes, arthralgias, and myalgias.  Information printed for the patient and son on discharge papers from [facility]. 9. Rx for Aromasin 25 mg daily #30 with 3 refills.  To start on April 28, 2013. 10. Return in 4-6 weeks for symptom appointment to make sure she is tolerating Aromasin well.     THERAPY PLAN:  She will start Aromasin on 04/28/2013 and she will take for 5 years.  We will treat osteoporosis with Prolia every 6 months beginning on 04/26/2013 with Oscal 2 tablets daily.   All questions were answered. The patient knows to call the clinic with any problems, questions or concerns. We can certainly see the patient much sooner if necessary.  Patient and plan discussed with Dr. Alla German and he is in agreement with the aforementioned.   More than 50% of the time spent with the patient was utilized for  counseling and coordination of care.  KEFALAS,THOMAS

## 2013-04-26 ENCOUNTER — Encounter (HOSPITAL_BASED_OUTPATIENT_CLINIC_OR_DEPARTMENT_OTHER): Payer: Medicare Other

## 2013-04-26 VITALS — BP 119/65 | HR 93

## 2013-04-26 DIAGNOSIS — C50919 Malignant neoplasm of unspecified site of unspecified female breast: Secondary | ICD-10-CM

## 2013-04-26 DIAGNOSIS — M810 Age-related osteoporosis without current pathological fracture: Secondary | ICD-10-CM

## 2013-04-26 MED ORDER — DENOSUMAB 60 MG/ML ~~LOC~~ SOLN
60.0000 mg | Freq: Once | SUBCUTANEOUS | Status: AC
  Administered 2013-04-26: 60 mg via SUBCUTANEOUS
  Filled 2013-04-26: qty 1

## 2013-04-26 NOTE — Progress Notes (Signed)
VSS.  Prolia 60 mg given to left lower abd.  Tolerated well.

## 2013-05-29 NOTE — Progress Notes (Signed)
Tamara Perches, MD [address] [place] [address]  Breast cancer, right breast - Plan: HYDROcodone-acetaminophen (NORCO) 5-325 MG per tablet, CBC with Differential, Comprehensive metabolic panel, CBC with Differential, Comprehensive metabolic panel, CBC with Differential, Comprehensive metabolic panel  CURRENT THERAPY: Aromasin daily beginning on 04/28/2013 which she will take for 5 years. Also on Prolia every 6 months beginning on 04/26/2013.  INTERVAL HISTORY: Tamara [name] 79 y.o. female returns for  regular  visit for followup of Stage IA (T1b, N0, M0) invasive mucinous breast carcinoma, S/P right breast lumpectomy by Dr. Lovell Sheehan on 03/08/2013 with 1 negative sentinel node. Cancer was identified as ER+ 100%, PR+ 85%, Her2 negative, and Ki-67 marker at 17%. Oncotype Dx score of 20 with 13% 10 year risk of distant recurrence and absolute benefit of chemotherapy at 10 years in this risk category is negligible and less than 1%.   I personally reviewed and went over pathology results with the patient. I reviewed her diagnosis and stage of IA. With this stage and age greater than 24, radiation therapy is not indicated, but she was seen by Dr. Thersa Salt (Rad Onc), who agreed with this assumption and therefore she has not received radiation therapy.   Today's visit is to follow-up on her new start of Aromasin.  She reports that she is tolerating the medication well, but has started to experience B/L foot pain that began after starting the medication.  She denies any trauma or history of trauma to her feet.  She denies any open lesions or sores.  She reports that she has been taking Hydrocodone that she was given post-operatively from her breast surgery by Dr. Lovell Sheehan.  This Rx was for #40 and was given on 03/08/2013.  She reports that she takes the medication sparingly.  I will refill this medication and I recommend Advil, Aleve, or tylenol when pain is mild to moderate.  She is  encouraged to take 1/2 pill of Hydrocodone when pain is moderate.   I suspect this symptom is secondary to Aromasin.  She does not want to switch to another AI at this time and wants to continue with the Aromasin.  She promises to contact the clinic if the pain increases or becomes a problem as we can switch to another AI.  She is agreeable to this plan.   Otherwise, she denies any complaints and oncologic ROS questioning is negative. She will get her influenza vaccine soon at local pharmacy.     Past Medical History  Diagnosis Date  . Hypertension   . Cancer   . Claustrophobia   . Claustrophobia   . Breast cancer   . Osteoporosis   . Breast cancer, right breast 04/25/2013    has OSTEOPOROSIS; COUGH, CHRONIC; and Breast cancer, right breast on her problem list.     has No Known Allergies.  Tamara [name] had no medications administered during this visit.  Past Surgical History  Procedure Laterality Date  . Abdominal hysterectomy    . Cataract extraction, bilateral    . Partial mastectomy with needle localization and axillary sentinel lymph node bx Right 03/08/2013    Procedure: PARTIAL MASTECTOMY WITH NEEDLE LOCALIZATION AND AXILLARY SENTINEL LYMPH NODE BX;  Surgeon: Dalia Heading, MD;  Location: [facility];  Service: General;  Laterality: Right;  Sentinel Node Bx @ 7:30am Needle Loc @ 8:00am    Denies any headaches, dizziness, double vision, fevers, chills, night sweats, nausea, vomiting, diarrhea, constipation, chest pain, heart  palpitations, shortness of breath, blood in stool, black tarry stool, urinary pain, urinary burning, urinary frequency, hematuria.   PHYSICAL EXAMINATION  ECOG PERFORMANCE STATUS: 0 - Asymptomatic  Filed Vitals:   05/30/13 1254  BP: 113/68  Pulse: 101  Temp: 98.4 F (36.9 C)  Resp: 16    GENERAL:alert, no distress, well nourished, well developed, comfortable, cooperative, obese and smiling SKIN: skin color, texture, turgor are normal, no rashes or  significant lesions HEAD: Normocephalic, No masses, lesions, tenderness or abnormalities EYES: normal, PERRLA, EOMI, Conjunctiva are pink and non-injected EARS: External ears normal OROPHARYNX:mucous membranes are moist  NECK: supple, no adenopathy, thyroid normal size, non-tender, without nodularity, no stridor, non-tender, trachea midline LYMPH:  no palpable lymphadenopathy, no hepatosplenomegaly BREAST:left breast normal without mass, skin or nipple changes or axillary nodes, right post-lumpectomy site well healed with a scab/dry skin and free of suspicious changes except for hematoma at lumpectomy site.  LUNGS: clear to auscultation and percussion HEART: regular rate & rhythm, no murmurs, no gallops, S1 normal and S2 normal ABDOMEN:abdomen soft, non-tender, normal bowel sounds, no masses or organomegaly and no hepatosplenomegaly BACK: Back symmetric, no curvature., No CVA tenderness EXTREMITIES:less then 2 second capillary refill, no joint deformities, effusion, or inflammation, no edema, no skin discoloration, no clubbing, no cyanosis  NEURO: alert & oriented x 3 with fluent speech, no focal motor/sensory deficits, gait normal    LABORATORY DATA: CBC    Component Value Date/Time   WBC 6.9 04/25/2013 1610   RBC 3.94 04/25/2013 1610   HGB 12.1 04/25/2013 1610   HCT 37.0 04/25/2013 1610   PLT 231 04/25/2013 1610   MCV 93.9 04/25/2013 1610   MCH 30.7 04/25/2013 1610   MCHC 32.7 04/25/2013 1610   RDW 13.3 04/25/2013 1610   LYMPHSABS 1.7 04/25/2013 1610   MONOABS 0.6 04/25/2013 1610   EOSABS 0.3 04/25/2013 1610   BASOSABS 0.0 04/25/2013 1610      Chemistry      Component Value Date/Time   NA 141 04/25/2013 1610   K 4.3 04/25/2013 1610   CL 105 04/25/2013 1610   CO2 28 04/25/2013 1610   BUN 13 04/25/2013 1610   CREATININE 0.86 04/25/2013 1610      Component Value Date/Time   CALCIUM 9.0 04/25/2013 1610   ALKPHOS 64 04/25/2013 1610   AST 19 04/25/2013 1610   ALT 12 04/25/2013 1610   BILITOT  0.4 04/25/2013 1610        ASSESSMENT:  1. Stage IA (T1b, N0, M0) invasive mucinous breast carcinoma, S/P right breast lumpectomy by Dr. Lovell Sheehan on 03/08/2013 with 1 negative sentinel node. Cancer was identified as ER+ 100%, PR+ 85%, Her2 negative, and Ki-67 marker at 17%. Oncotype Dx score of 20 with 13% 10 year risk of distant recurrence and absolute benefit of chemotherapy at 10 years in this risk category is negligible and less than 1%.  She started Aromasin beginning on 04/28/2013 which she will take for 5 years.  2. Osteoporosis, worsening. Started Prolia on 04/26/2013 with Oscal 2 tabs daily.  3. Aromasin-induced B/L foot pain (arthralgia), intermittent  Patient Active Problem List   Diagnosis Date Noted  . Breast cancer, right breast 04/25/2013  . OSTEOPOROSIS 08/10/2007  . COUGH, CHRONIC 06/28/2007     PLAN:  1. I personally reviewed and went over laboratory results with the patient. 2. Prolia every 6 months, last injection given on 04/26/2013 3. Oscal 2 tablets daily to prevent hypocalcemia. 4. Influenza vaccine to be given at local  pharmacy 5. Rx for Hydrocodone 5/325 #40 with 0 refills.  6. Encouraged OTC Tylenol, Advil, Aleve for mild-moderate foot discomfort.  7. Patient education regarding categorical side effects of AI's 8. Patient declines a switch in AI at this time, but will contact the clinic is symptom persist or worsen 9. Labs work today: CBC diff, CMET 10. Labs in 4 months: CBC diff, CMET 11. Return in 4 months for follow-up.   THERAPY PLAN:  I suspect the patient is experiencing arthalgias in feet secondary to Aromasin.  These are mild and are well-managed the patient reports.  She declines a change in therapy at this time.  She has promised to contact the clinic for persistent symptom or worsening of discomfort. If this occurs, I will change to Arimidex or Femara.  We will follow NCCN guidelines for surveillance.  NCCN guidelines recommends the following  surveillance for invasive breast cancer:  A. History and Physical exam every 4-6 months for 5 years and then every 12 months.  B. Mammography every 12 months  C. Women on Tamoxifen: annual gynecologic assessment every 12 months if uterus is present.  D. Women on aromatase inhibitor or who experience ovarian failure secondary to treatment should have monitoring of bone health with a bone mineral density determination at baseline and periodically thereafter.  E. Assess and encourage adherence to adjuvant endocrine therapy.  F. Evidence suggests that active lifestyle and achieving and maintaining an ideal body weight (20-25 BMI) may lead to optimal breast cancer outcomes.  All questions were answered. The patient knows to call the clinic with any problems, questions or concerns. We can certainly see the patient much sooner if necessary.  Patient and plan discussed with Dr. Annamarie Dawley and she is in agreement with the aforementioned.    Obie Kallenbach

## 2013-05-30 ENCOUNTER — Encounter (HOSPITAL_COMMUNITY): Payer: Self-pay | Admitting: Oncology

## 2013-05-30 ENCOUNTER — Encounter (HOSPITAL_COMMUNITY): Payer: Medicare Other | Attending: Hematology and Oncology | Admitting: Oncology

## 2013-05-30 VITALS — BP 113/68 | HR 101 | Temp 98.4°F | Resp 16 | Wt 156.0 lb

## 2013-05-30 DIAGNOSIS — M2550 Pain in unspecified joint: Secondary | ICD-10-CM

## 2013-05-30 DIAGNOSIS — C50919 Malignant neoplasm of unspecified site of unspecified female breast: Secondary | ICD-10-CM

## 2013-05-30 DIAGNOSIS — M810 Age-related osteoporosis without current pathological fracture: Secondary | ICD-10-CM

## 2013-05-30 DIAGNOSIS — C50911 Malignant neoplasm of unspecified site of right female breast: Secondary | ICD-10-CM

## 2013-05-30 DIAGNOSIS — Z170 Estrogen receptor positive status [ER+]: Secondary | ICD-10-CM

## 2013-05-30 LAB — CBC WITH DIFFERENTIAL/PLATELET
Basophils Absolute: 0 10*3/uL (ref 0.0–0.1)
Basophils Relative: 0 % (ref 0–1)
Eosinophils Absolute: 0.3 10*3/uL (ref 0.0–0.7)
Eosinophils Relative: 5 % (ref 0–5)
HCT: 38.1 % (ref 36.0–46.0)
Hemoglobin: 12.3 g/dL (ref 12.0–15.0)
Lymphocytes Relative: 24 % (ref 12–46)
Lymphs Abs: 1.4 10*3/uL (ref 0.7–4.0)
MCH: 30.1 pg (ref 26.0–34.0)
MCHC: 32.3 g/dL (ref 30.0–36.0)
MCV: 93.4 fL (ref 78.0–100.0)
Monocytes Absolute: 0.7 10*3/uL (ref 0.1–1.0)
Monocytes Relative: 11 % (ref 3–12)
Neutro Abs: 3.7 10*3/uL (ref 1.7–7.7)
Neutrophils Relative %: 60 % (ref 43–77)
Platelets: 236 10*3/uL (ref 150–400)
RBC: 4.08 MIL/uL (ref 3.87–5.11)
RDW: 14.1 % (ref 11.5–15.5)
WBC: 6.1 10*3/uL (ref 4.0–10.5)

## 2013-05-30 LAB — COMPREHENSIVE METABOLIC PANEL WITH GFR
ALT: 25 U/L (ref 0–35)
AST: 29 U/L (ref 0–37)
Albumin: 3.8 g/dL (ref 3.5–5.2)
Alkaline Phosphatase: 84 U/L (ref 39–117)
BUN: 12 mg/dL (ref 6–23)
CO2: 28 meq/L (ref 19–32)
Calcium: 8.9 mg/dL (ref 8.4–10.5)
Chloride: 105 meq/L (ref 96–112)
Creatinine, Ser: 0.82 mg/dL (ref 0.50–1.10)
GFR calc Af Amer: 79 mL/min — ABNORMAL LOW
GFR calc non Af Amer: 68 mL/min — ABNORMAL LOW
Glucose, Bld: 108 mg/dL — ABNORMAL HIGH (ref 70–99)
Potassium: 3.2 meq/L — ABNORMAL LOW (ref 3.5–5.1)
Sodium: 141 meq/L (ref 135–145)
Total Bilirubin: 0.6 mg/dL (ref 0.3–1.2)
Total Protein: 7.5 g/dL (ref 6.0–8.3)

## 2013-05-30 MED ORDER — HYDROCODONE-ACETAMINOPHEN 5-325 MG PO TABS: 1.0000 | ORAL_TABLET | ORAL | Status: DC | PRN

## 2013-05-30 NOTE — Progress Notes (Signed)
Tamara [name] presented for [employer]. Labs per MD order drawn via Peripheral Line 23 gauge needle inserted in left AC  Good blood return present. Procedure without incident.  Needle removed intact. Patient tolerated procedure well.

## 2013-05-30 NOTE — Patient Instructions (Signed)
[facility] Discharge Instructions  RECOMMENDATIONS MADE BY THE CONSULTANT AND ANY TEST RESULTS WILL BE SENT TO YOUR REFERRING PHYSICIAN.  EXAM FINDINGS BY THE PHYSICIAN TODAY AND SIGNS OR SYMPTOMS TO REPORT TO CLINIC OR PRIMARY PHYSICIAN: Exam and findings as discussed by Dellis Anes, PA-C. If leg and foot pain does not get better or worsens, let us know.  There are other anti-hormonal agents that we can use. Report any new lumps, bone pain, shortness of breath or other symptoms.  MEDICATIONS PRESCRIBED:  Refill for hydrocodone - take as needed for foot and leg discomfort.  INSTRUCTIONS/FOLLOW-UP: Follow-up in 4 months.  Thank you for choosing [facility] to provide your oncology and hematology care.  To afford each patient quality time with our providers, please arrive at least 15 minutes before your scheduled appointment time.  With your help, our goal is to use those 15 minutes to complete the necessary work-up to ensure our physicians have the information they need to help with your evaluation and healthcare recommendations.    Effective January 1st, 2014, we ask that you re-schedule your appointment with our physicians should you arrive 10 or more minutes late for your appointment.  We strive to give you quality time with our providers, and arriving late affects you and other patients whose appointments are after yours.    Again, thank you for choosing [facility].  Our hope is that these requests will decrease the amount of time that you wait before being seen by our physicians.       _____________________________________________________________  Should you have questions after your visit to [facility], please contact our office at [phone] between the hours of 8:30 a.m. and 5:00 p.m.  Voicemails left after 4:30 p.m. will not be returned until the following business day.  For prescription refill requests, have your  pharmacy contact our office with your prescription refill request.

## 2013-05-31 ENCOUNTER — Other Ambulatory Visit (HOSPITAL_COMMUNITY): Payer: Self-pay | Admitting: Oncology

## 2013-05-31 DIAGNOSIS — E876 Hypokalemia: Secondary | ICD-10-CM

## 2013-05-31 MED ORDER — POTASSIUM CHLORIDE ER 10 MEQ PO TBCR: 20.0000 meq | EXTENDED_RELEASE_TABLET | Freq: Every day | ORAL | Status: DC

## 2013-08-08 ENCOUNTER — Other Ambulatory Visit (HOSPITAL_COMMUNITY): Payer: Self-pay | Admitting: Oncology

## 2013-08-08 DIAGNOSIS — C50911 Malignant neoplasm of unspecified site of right female breast: Secondary | ICD-10-CM

## 2013-08-08 MED ORDER — EXEMESTANE 25 MG PO TABS: 25.0000 mg | ORAL_TABLET | Freq: Every day | ORAL | Status: DC

## 2013-09-26 NOTE — Progress Notes (Signed)
Tamara Noble, MD [address] [place] [address]  Breast cancer, right breast  CURRENT THERAPY: Aromasin daily beginning on 04/28/2013 which she will take for 5 years. Also on Prolia every 6 months beginning on 04/26/2013.  INTERVAL HISTORY: Tamara [name] 80 y.o. female returns for  regular  visit for followup of Stage IA (T1b, N0, M0) invasive mucinous breast carcinoma, S/P right breast lumpectomy by Dr. Arnoldo Morale on 03/08/2013 with 1 negative sentinel node. Cancer was identified as ER+ 100%, PR+ 85%, Her2 negative, and Ki-67 marker at 17%. Oncotype Dx score of 20 with 13% 10 year risk of distant recurrence and absolute benefit of chemotherapy at 10 years in this risk category is negligible and less than 1%.  AND Osteoporosis, on Prolia every 6 months.     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  Her2 negative, ER 100%, PR 85%, Ki-67 17%.   03/08/2013 Surgery Right lumpectomy with sentinel node biopsy revealing adjacent DCIS (intermediate grade).  Lymph node is negative for disease.  T1b N0.   04/28/2013 -  Chemotherapy Aromasin daily.  She will take for 5 years.    I personally reviewed and went over laboratory results with the patient.  The results are noted within this dictation.  She is tolerating her Aromasin well. She does note that she has left ankle pain which is dictated in her previous note. She also notes that after long periods of immobility, her right ankle hurts as well. It is possibly from Aromasin but this is not typical presentation of the side effect of arthralgias associated with Aromasin. She otherwise is tolerating the medication very well.  NCCN guidelines recommends the following surveillance for invasive breast cancer:  A. History and Physical exam every 4-6 months for 5 years and then every 12 months.  B. Mammography every 12 months  C. Women on Tamoxifen:  annual gynecologic assessment every 12 months if uterus is present.  D. Women on aromatase inhibitor or who experience ovarian failure secondary to treatment should have monitoring of bone health with a bone mineral density determination at baseline and periodically thereafter.  E. Assess and encourage adherence to adjuvant endocrine therapy.  F. Evidence suggests that active lifestyle and achieving and maintaining an ideal body weight (20-25 BMI) may lead to optimal breast cancer outcomes.  Oncologically, the patient otherwise denies any complaints in ROS questioning is negative.  Past Medical History  Diagnosis Date  . Hypertension   . Cancer   . Claustrophobia   . Claustrophobia   . Breast cancer   . Osteoporosis   . Breast cancer, right breast 04/25/2013    has OSTEOPOROSIS; COUGH, CHRONIC; and Breast cancer, right breast on her problem list.     has No Known Allergies.  Ms. Lathon does not currently have medications on file.  Past Surgical History  Procedure Laterality Date  . Abdominal hysterectomy    . Cataract extraction, bilateral    . Partial mastectomy with needle localization and axillary sentinel lymph node bx Right 03/08/2013    Procedure: PARTIAL MASTECTOMY WITH NEEDLE LOCALIZATION AND AXILLARY SENTINEL LYMPH NODE BX;  Surgeon: Jamesetta So, MD;  Location: [facility];  Service: General;  Laterality: Right;  Sentinel Node Bx @ 7:30am Needle Loc @ 8:00am    Denies any headaches, dizziness, double vision, fevers, chills, night sweats, nausea, vomiting, diarrhea, constipation, chest pain, heart palpitations, shortness of breath,  blood in stool, black tarry stool, urinary pain, urinary burning, urinary frequency, hematuria.   PHYSICAL EXAMINATION  ECOG PERFORMANCE STATUS: 1 - Symptomatic but completely ambulatory  There were no vitals filed for this visit.  GENERAL:alert, no distress, well nourished, well developed, comfortable, cooperative and smiling SKIN: skin color,  texture, turgor are normal, no rashes or significant lesions HEAD: Normocephalic, No masses, lesions, tenderness or abnormalities EYES: normal, PERRLA, EOMI, Conjunctiva are pink and non-injected EARS: External ears normal OROPHARYNX:mucous membranes are moist  NECK: supple, no adenopathy, thyroid normal size, non-tender, without nodularity, no stridor, non-tender, trachea midline LYMPH:  no palpable lymphadenopathy BREAST:not examined LUNGS: clear to auscultation and percussion HEART: regular rate & rhythm, no murmurs, no gallops, S1 normal and S2 normal ABDOMEN:abdomen soft, non-tender and normal bowel sounds BACK: Back symmetric, no curvature. EXTREMITIES:less then 2 second capillary refill, no skin discoloration, no clubbing, no cyanosis  NEURO: alert & oriented x 3 with fluent speech, no focal motor/sensory deficits, gait normal   LABORATORY DATA: CBC    Component Value Date/Time   WBC 6.1 05/30/2013 1329   RBC 4.08 05/30/2013 1329   HGB 12.3 05/30/2013 1329   HCT 38.1 05/30/2013 1329   PLT 236 05/30/2013 1329   MCV 93.4 05/30/2013 1329   MCH 30.1 05/30/2013 1329   MCHC 32.3 05/30/2013 1329   RDW 14.1 05/30/2013 1329   LYMPHSABS 1.4 05/30/2013 1329   MONOABS 0.7 05/30/2013 1329   EOSABS 0.3 05/30/2013 1329   BASOSABS 0.0 05/30/2013 1329      Chemistry      Component Value Date/Time   NA 141 05/30/2013 1329   K 3.2* 05/30/2013 1329   CL 105 05/30/2013 1329   CO2 28 05/30/2013 1329   BUN 12 05/30/2013 1329   CREATININE 0.82 05/30/2013 1329      Component Value Date/Time   CALCIUM 8.9 05/30/2013 1329   ALKPHOS 84 05/30/2013 1329   AST 29 05/30/2013 1329   ALT 25 05/30/2013 1329   BILITOT 0.6 05/30/2013 1329        ASSESSMENT:  1. Stage IA (T1b, N0, M0) invasive mucinous breast carcinoma, S/P right breast lumpectomy by Dr. Arnoldo Morale on 03/08/2013 with 1 negative sentinel node. Cancer was identified as ER+ 100%, PR+ 85%, Her2 negative, and Ki-67 marker at 17%.  Oncotype Dx score of 20 with 13% 10 year risk of distant recurrence and absolute benefit of chemotherapy at 10 years in this risk category is negligible and less than 1%. She started Aromasin beginning on 04/28/2013 which she will take for 5 years.  2. Osteoporosis, worsening. Started Prolia on 04/26/2013 with Oscal 2 tabs daily.  3. B/L foot pain (arthralgia), intermittent, possibly Aromasin-induced.    Patient Active Problem List   Diagnosis Date Noted  . Breast cancer, right breast 04/25/2013  . OSTEOPOROSIS 08/10/2007  . COUGH, CHRONIC 06/28/2007     PLAN: 1. I personally reviewed and went over laboratory results with the patient.  The results are noted within this dictation. 2. Next screening mammogram is due in August 2015 3. Prolia every 6 months, last injection given on 04/26/2013 4. Oscal 2 tablets daily to prevent hypocalcemia. 5. Labs as scheduled in preparation for next Prolia injection: CBC diff, CMET 6. Prolia 60 mg as scheduled on 4/7 7. Labs in 6 months: CBC diff, CMET 8. Prolia injection in 6 months 9. Refill on Hydrocodone #40 10. Recommend Xanax refill from PCP if indicted. 11. Return in 6 months for follow-up   THERAPY  PLAN:  She is to continue with AI therapy x 5 years finishing in October 2019 and we will continue with Prolia 60 mg every 6 months for bone density health.  NCCN guidelines recommends the following surveillance for invasive breast cancer:  A. History and Physical exam every 4-6 months for 5 years and then every 12 months.  B. Mammography every 12 months  C. Women on Tamoxifen: annual gynecologic assessment every 12 months if uterus is present.  D. Women on aromatase inhibitor or who experience ovarian failure secondary to treatment should have monitoring of bone health with a bone mineral density determination at baseline and periodically thereafter.  E. Assess and encourage adherence to adjuvant endocrine therapy.  F. Evidence suggests that active  lifestyle and achieving and maintaining an ideal body weight (20-25 BMI) may lead to optimal breast cancer outcomes.   All questions were answered. The patient knows to call the clinic with any problems, questions or concerns. We can certainly see the patient much sooner if necessary.  Patient and plan discussed with Dr. Farrel Gobble and he is in agreement with the aforementioned.   KEFALAS,THOMAS

## 2013-09-27 ENCOUNTER — Encounter (HOSPITAL_COMMUNITY): Payer: Self-pay | Admitting: Oncology

## 2013-09-27 ENCOUNTER — Encounter (HOSPITAL_COMMUNITY): Payer: Medicare Other | Attending: Oncology | Admitting: Oncology

## 2013-09-27 VITALS — BP 124/72 | HR 93 | Temp 98.7°F | Resp 18 | Wt 155.8 lb

## 2013-09-27 DIAGNOSIS — C50919 Malignant neoplasm of unspecified site of unspecified female breast: Secondary | ICD-10-CM

## 2013-09-27 DIAGNOSIS — I10 Essential (primary) hypertension: Secondary | ICD-10-CM | POA: Insufficient documentation

## 2013-09-27 DIAGNOSIS — Z9221 Personal history of antineoplastic chemotherapy: Secondary | ICD-10-CM | POA: Insufficient documentation

## 2013-09-27 DIAGNOSIS — C50911 Malignant neoplasm of unspecified site of right female breast: Secondary | ICD-10-CM

## 2013-09-27 DIAGNOSIS — R05 Cough: Secondary | ICD-10-CM | POA: Insufficient documentation

## 2013-09-27 DIAGNOSIS — Z853 Personal history of malignant neoplasm of breast: Secondary | ICD-10-CM | POA: Insufficient documentation

## 2013-09-27 DIAGNOSIS — M810 Age-related osteoporosis without current pathological fracture: Secondary | ICD-10-CM | POA: Insufficient documentation

## 2013-09-27 DIAGNOSIS — Z9010 Acquired absence of unspecified breast and nipple: Secondary | ICD-10-CM | POA: Insufficient documentation

## 2013-09-27 DIAGNOSIS — Z170 Estrogen receptor positive status [ER+]: Secondary | ICD-10-CM

## 2013-09-27 DIAGNOSIS — R059 Cough, unspecified: Secondary | ICD-10-CM | POA: Insufficient documentation

## 2013-09-27 DIAGNOSIS — M25579 Pain in unspecified ankle and joints of unspecified foot: Secondary | ICD-10-CM | POA: Insufficient documentation

## 2013-09-27 DIAGNOSIS — Z09 Encounter for follow-up examination after completed treatment for conditions other than malignant neoplasm: Secondary | ICD-10-CM | POA: Insufficient documentation

## 2013-09-27 MED ORDER — HYDROCODONE-ACETAMINOPHEN 5-325 MG PO TABS: 1.0000 | ORAL_TABLET | ORAL | Status: DC | PRN

## 2013-09-27 NOTE — Patient Instructions (Signed)
[facility] Discharge Instructions  RECOMMENDATIONS MADE BY THE CONSULTANT AND ANY TEST RESULTS WILL BE SENT TO YOUR REFERRING PHYSICIAN.  We will see you next month for your injection. We will see you in 6 months for your next injection and labs.   Thank you for choosing [facility] to provide your oncology and hematology care.  To afford each patient quality time with our providers, please arrive at least 15 minutes before your scheduled appointment time.  With your help, our goal is to use those 15 minutes to complete the necessary work-up to ensure our physicians have the information they need to help with your evaluation and healthcare recommendations.    Effective January 1st, 2014, we ask that you re-schedule your appointment with our physicians should you arrive 10 or more minutes late for your appointment.  We strive to give you quality time with our providers, and arriving late affects you and other patients whose appointments are after yours.    Again, thank you for choosing [facility].  Our hope is that these requests will decrease the amount of time that you wait before being seen by our physicians.       _____________________________________________________________  Should you have questions after your visit to [facility], please contact our office at [phone] between the hours of 8:30 a.m. and 5:00 p.m.  Voicemails left after 4:30 p.m. will not be returned until the following business day.  For prescription refill requests, have your pharmacy contact our office with your prescription refill request.

## 2013-10-10 ENCOUNTER — Encounter (HOSPITAL_BASED_OUTPATIENT_CLINIC_OR_DEPARTMENT_OTHER): Payer: Medicare Other

## 2013-10-10 DIAGNOSIS — C50911 Malignant neoplasm of unspecified site of right female breast: Secondary | ICD-10-CM

## 2013-10-10 DIAGNOSIS — C50919 Malignant neoplasm of unspecified site of unspecified female breast: Secondary | ICD-10-CM

## 2013-10-10 LAB — COMPREHENSIVE METABOLIC PANEL
ALK PHOS: 69 U/L (ref 39–117)
ALT: 10 U/L (ref 0–35)
AST: 18 U/L (ref 0–37)
Albumin: 3.9 g/dL (ref 3.5–5.2)
BUN: 11 mg/dL (ref 6–23)
CALCIUM: 9.3 mg/dL (ref 8.4–10.5)
CO2: 31 mEq/L (ref 19–32)
Chloride: 104 mEq/L (ref 96–112)
Creatinine, Ser: 0.83 mg/dL (ref 0.50–1.10)
GFR calc non Af Amer: 66 mL/min — ABNORMAL LOW (ref >90)
GFR, EST AFRICAN AMERICAN: 77 mL/min — AB (ref >90)
GLUCOSE: 120 mg/dL — AB (ref 70–99)
Potassium: 4.4 mEq/L (ref 3.7–5.3)
Sodium: 145 mEq/L (ref 137–147)
TOTAL PROTEIN: 8.1 g/dL (ref 6.0–8.3)
Total Bilirubin: 0.6 mg/dL (ref 0.3–1.2)

## 2013-10-10 LAB — CBC WITH DIFFERENTIAL/PLATELET
Basophils Absolute: 0 10*3/uL (ref 0.0–0.1)
Basophils Relative: 0 % (ref 0–1)
EOS ABS: 0.1 10*3/uL (ref 0.0–0.7)
EOS PCT: 2 % (ref 0–5)
HCT: 38.8 % (ref 36.0–46.0)
HEMOGLOBIN: 13 g/dL (ref 12.0–15.0)
LYMPHS ABS: 1.3 10*3/uL (ref 0.7–4.0)
Lymphocytes Relative: 17 % (ref 12–46)
MCH: 31.1 pg (ref 26.0–34.0)
MCHC: 33.5 g/dL (ref 30.0–36.0)
MCV: 92.8 fL (ref 78.0–100.0)
MONOS PCT: 11 % (ref 3–12)
Monocytes Absolute: 0.8 10*3/uL (ref 0.1–1.0)
Neutro Abs: 5.2 10*3/uL (ref 1.7–7.7)
Neutrophils Relative %: 70 % (ref 43–77)
Platelets: 239 10*3/uL (ref 150–400)
RBC: 4.18 MIL/uL (ref 3.87–5.11)
RDW: 14.3 % (ref 11.5–15.5)
WBC: 7.4 10*3/uL (ref 4.0–10.5)

## 2013-10-10 NOTE — Progress Notes (Signed)
Labs drawn today for cbc/diff,cmp 

## 2013-10-11 ENCOUNTER — Ambulatory Visit (HOSPITAL_COMMUNITY): Payer: Medicare Other

## 2013-10-24 ENCOUNTER — Encounter (HOSPITAL_COMMUNITY): Payer: Medicare Other | Attending: Oncology

## 2013-10-24 VITALS — BP 124/74 | HR 83 | Resp 18

## 2013-10-24 DIAGNOSIS — M810 Age-related osteoporosis without current pathological fracture: Secondary | ICD-10-CM

## 2013-10-24 MED ORDER — DENOSUMAB 60 MG/ML ~~LOC~~ SOLN
60.0000 mg | Freq: Once | SUBCUTANEOUS | Status: AC
  Administered 2013-10-24: 60 mg via SUBCUTANEOUS
  Filled 2013-10-24: qty 1

## 2013-10-24 NOTE — Progress Notes (Signed)
Tamara [name] presents today for injection per MD orders. prolia 60 mg administered SQ in left Abdomen. Administration without incident. Patient tolerated well.

## 2014-01-05 ENCOUNTER — Other Ambulatory Visit (HOSPITAL_COMMUNITY): Payer: Self-pay | Admitting: Internal Medicine

## 2014-01-05 DIAGNOSIS — Z9889 Other specified postprocedural states: Secondary | ICD-10-CM

## 2014-01-10 ENCOUNTER — Ambulatory Visit (HOSPITAL_COMMUNITY)
Admission: RE | Admit: 2014-01-10 | Discharge: 2014-01-10 | Disposition: A | Discharge status: Home / Self Care | Payer: Medicare Other | Source: Ambulatory Visit | Attending: Internal Medicine | Admitting: Internal Medicine

## 2014-01-10 DIAGNOSIS — Z9889 Other specified postprocedural states: Secondary | ICD-10-CM

## 2014-01-10 DIAGNOSIS — Z853 Personal history of malignant neoplasm of breast: Secondary | ICD-10-CM | POA: Insufficient documentation

## 2014-02-12 ENCOUNTER — Other Ambulatory Visit (HOSPITAL_COMMUNITY): Payer: Self-pay | Admitting: Oncology

## 2014-02-12 DIAGNOSIS — C50911 Malignant neoplasm of unspecified site of right female breast: Secondary | ICD-10-CM

## 2014-02-12 MED ORDER — EXEMESTANE 25 MG PO TABS: 25.0000 mg | ORAL_TABLET | Freq: Every day | ORAL | Status: DC

## 2014-04-17 ENCOUNTER — Other Ambulatory Visit (HOSPITAL_COMMUNITY): Payer: Self-pay | Admitting: Oncology

## 2014-04-28 NOTE — Progress Notes (Signed)
[name], MD [address] [place] [address]  Breast cancer, right breast  Osteoporosis - Plan: denosumab (PROLIA) injection 60 mg, SCHEDULING COMMUNICATION, DG Bone Density  CURRENT THERAPY: Aromasin daily beginning on 04/28/2013 which she will take for 5 years. Also on Prolia every 6 months beginning on 04/26/2013.  INTERVAL HISTORY: Tamara [name] 80 y.o. female returns for  regular  visit for followup of Stage IA (T1b, N0, M0) invasive mucinous breast carcinoma, S/P right breast lumpectomy by Dr. Arnoldo Morale on 03/08/2013 with 1 negative sentinel node. Cancer was identified as ER+ 100%, PR+ 85%, Her2 negative, and Ki-67 marker at 17%. Oncotype Dx score of 20 with 13% 10 year risk of distant recurrence and absolute benefit of chemotherapy at 10 years in this risk category is negligible and less than 1%.  AND  Osteoporosis, on Prolia every 6 months.      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  Her2 negative, ER 100%, PR 85%, Ki-67 17%.   03/08/2013 Surgery Right lumpectomy with sentinel node biopsy revealing adjacent DCIS (intermediate grade).  Lymph node is negative for disease.  T1b N0.   04/28/2013 -  Chemotherapy Aromasin daily.  She will take for 5 years.   I personally reviewed and went over laboratory results with the patient.  The results are noted within this dictation.  I personally reviewed and went over radiographic studies with the patient.  The results are noted within this dictation.  Mammogram in June 2015 was BIRADS 1.  She will be due for her next screening mammogram in June 2016.  She is tolerating Aromasin well and she has no been on the medication for 1 year.  Additionally, she is tolerating Prolia every 6 months.  Oncologically, she denies any complaints and ROS questioning is negative.  "I feel good."  Past Medical History  Diagnosis Date  . Hypertension     . Cancer   . Claustrophobia   . Claustrophobia   . Breast cancer   . Osteoporosis   . Breast cancer, right breast 04/25/2013    has Osteoporosis; COUGH, CHRONIC; and Breast cancer, right breast on her problem list.     has No Known Allergies.  Tamara [name] does not currently have medications on file.  Past Surgical History  Procedure Laterality Date  . Abdominal hysterectomy    . Cataract extraction, bilateral    . Partial mastectomy with needle localization and axillary sentinel lymph node bx Right 03/08/2013    Procedure: PARTIAL MASTECTOMY WITH NEEDLE LOCALIZATION AND AXILLARY SENTINEL LYMPH NODE BX;  Surgeon: Jamesetta So, MD;  Location: [facility];  Service: General;  Laterality: Right;  Sentinel Node Bx @ 7:30am Needle Loc @ 8:00am    Denies any headaches, dizziness, double vision, fevers, chills, night sweats, nausea, vomiting, diarrhea, constipation, chest pain, heart palpitations, shortness of breath, blood in stool, black tarry stool, urinary pain, urinary burning, urinary frequency, hematuria.   PHYSICAL EXAMINATION  ECOG PERFORMANCE STATUS: 0 - Asymptomatic  Filed Vitals:   04/30/14 1000  BP: 134/77  Pulse: 89  Temp: 98.8 F (37.1 C)  Resp: 20    GENERAL:alert, no distress, well nourished, well developed, comfortable, cooperative, obese and smiling SKIN: skin color, texture, turgor are normal, no rashes or significant lesions HEAD: Normocephalic, No masses, lesions, tenderness or abnormalities EYES: normal, PERRLA, EOMI, Conjunctiva are pink and non-injected EARS: External ears normal OROPHARYNX:lips, buccal  mucosa, and tongue normal and mucous membranes are moist  NECK: supple, no adenopathy, thyroid normal size, non-tender, without nodularity, no stridor, non-tender, trachea midline LYMPH:  no palpable lymphadenopathy, no hepatosplenomegaly BREAST:breasts appear normal, no suspicious masses, no skin or nipple changes or axillary nodes.  Left lateral breast SK  lesion measuring 1 cm in size with blackened areas within the SK lesion. LUNGS: clear to auscultation and percussion HEART: regular rate & rhythm, no murmurs, no gallops, S1 normal and S2 normal ABDOMEN:abdomen soft, non-tender, obese, normal bowel sounds and no masses or organomegaly BACK: Back symmetric, no curvature., No CVA tenderness EXTREMITIES:less then 2 second capillary refill, no joint deformities, effusion, or inflammation, no edema, no skin discoloration, no clubbing, no cyanosis  NEURO: alert & oriented x 3 with fluent speech, no focal motor/sensory deficits, gait normal   LABORATORY DATA: CBC    Component Value Date/Time   WBC 5.5 04/30/2014 1006   RBC 4.01 04/30/2014 1006   HGB 12.2 04/30/2014 1006   HCT 36.5 04/30/2014 1006   PLT 217 04/30/2014 1006   MCV 91.0 04/30/2014 1006   MCH 30.4 04/30/2014 1006   MCHC 33.4 04/30/2014 1006   RDW 14.0 04/30/2014 1006   LYMPHSABS 1.0 04/30/2014 1006   MONOABS 0.6 04/30/2014 1006   EOSABS 0.2 04/30/2014 1006   BASOSABS 0.0 04/30/2014 1006      Chemistry      Component Value Date/Time   NA 141 04/30/2014 1006   K 4.1 04/30/2014 1006   CL 104 04/30/2014 1006   CO2 26 04/30/2014 1006   BUN 17 04/30/2014 1006   CREATININE 0.88 04/30/2014 1006      Component Value Date/Time   CALCIUM 8.7 04/30/2014 1006   ALKPHOS 66 04/30/2014 1006   AST 16 04/30/2014 1006   ALT 11 04/30/2014 1006   BILITOT 0.7 04/30/2014 1006       RADIOGRAPHIC STUDIES:  01/10/2014  CLINICAL DATA: History of malignant lumpectomy of the right breast  in 2014. Annual re-evaluation.  EXAM:  DIGITAL DIAGNOSTIC bilateral MAMMOGRAM WITH CAD  COMPARISON: Previous examinations the most recent of which is dated  01/04/2013.  ACR Breast Density Category b: There are scattered areas of  fibroglandular density.  FINDINGS:  There are scarring changes located within the superior right breast  related to the patient's lumpectomy. There is no specific  evidence  for recurrent tumor or developing malignancy within either breast.  Mammographic images were processed with CAD.  IMPRESSION:  No findings worrisome for developing malignancy. Recommend annual  diagnostic mammography.  RECOMMENDATION:  Bilateral diagnostic mammography in 1 year.  I have discussed the findings and recommendations with the patient.  Results were also provided in writing at the conclusion of the  visit. If applicable, a reminder letter will be sent to the patient  regarding the next appointment.  BI-RADS CATEGORY 1: Negative.  Electronically Signed  By: Luberta Robertson M.D.  On: 01/10/2014 15:00    ASSESSMENT:  1. Stage IA (T1b, N0, M0) invasive mucinous breast carcinoma, S/P right breast lumpectomy by Dr. Arnoldo Morale on 03/08/2013 with 1 negative sentinel node. Cancer was identified as ER+ 100%, PR+ 85%, Her2 negative, and Ki-67 marker at 17%. Oncotype Dx score of 20 with 13% 10 year risk of distant recurrence and absolute benefit of chemotherapy at 10 years in this risk category is negligible and less than 1%. She started Aromasin beginning on 04/28/2013 which she will take for 5 years.  2. Osteoporosis, worsening. Started Prolia on 04/26/2013  with Oscal 2 tabs daily. Next bone density due in Sept 2016 3. Left lateral breast SK  Patient Active Problem List   Diagnosis Date Noted  . Breast cancer, right breast 04/25/2013  . Osteoporosis 08/10/2007  . COUGH, CHRONIC 06/28/2007    PLAN:  1. I personally reviewed and went over laboratory results with the patient.  The results are noted within this dictation. 2. I personally reviewed and went over radiographic studies with the patient.  The results are noted within this dictation.   3. Next screening mammogram is due in June 2016. 4. Prolia every 6 months, last injection given on 10/24/2013.  Supportive therapy plan reviewed. 5. Oscal 2 tablets daily to prevent hypocalcemia. 6. Labs as scheduled in preparation for next  Prolia injection: CBC diff, CMET 7. Prolia 60 mg as scheduled today (10/12) 8. Labs in 6 months: CBC diff, CMET 9. Prolia injection in 6 months 10. Next bone density is due in September 2016 11. Return in 6 months for follow-up   THERAPY PLAN:  She is to continue with AI therapy x 5 years finishing in October 2019 and we will continue with Prolia 60 mg every 6 months for bone density health.  NCCN guidelines recommends the following surveillance for invasive breast cancer:  A. History and Physical exam every 4-6 months for 5 years and then every 12 months.  B. Mammography every 12 months  C. Women on Tamoxifen: annual gynecologic assessment every 12 months if uterus is present.  D. Women on aromatase inhibitor or who experience ovarian failure secondary to treatment should have monitoring of bone health with a bone mineral density determination at baseline and periodically thereafter.  E. Assess and encourage adherence to adjuvant endocrine therapy.  F. Evidence suggests that active lifestyle and achieving and maintaining an ideal body weight (20-25 BMI) may lead to optimal breast cancer outcomes.   All questions were answered. The patient knows to call the clinic with any problems, questions or concerns. We can certainly see the patient much sooner if necessary.  Patient and plan discussed with Dr. Farrel Gobble and he is in agreement with the aforementioned.   Tamara [name] 04/30/2014

## 2014-04-30 ENCOUNTER — Encounter (HOSPITAL_COMMUNITY): Payer: Medicare Other | Attending: Hematology and Oncology

## 2014-04-30 ENCOUNTER — Encounter (HOSPITAL_COMMUNITY): Payer: Self-pay | Admitting: Oncology

## 2014-04-30 ENCOUNTER — Encounter (HOSPITAL_BASED_OUTPATIENT_CLINIC_OR_DEPARTMENT_OTHER): Payer: Medicare Other | Admitting: Oncology

## 2014-04-30 ENCOUNTER — Encounter (HOSPITAL_BASED_OUTPATIENT_CLINIC_OR_DEPARTMENT_OTHER): Payer: Medicare Other

## 2014-04-30 VITALS — BP 134/77 | HR 89 | Temp 98.8°F | Resp 20 | Wt 162.7 lb

## 2014-04-30 DIAGNOSIS — C50911 Malignant neoplasm of unspecified site of right female breast: Secondary | ICD-10-CM | POA: Insufficient documentation

## 2014-04-30 DIAGNOSIS — M810 Age-related osteoporosis without current pathological fracture: Secondary | ICD-10-CM | POA: Diagnosis not present

## 2014-04-30 DIAGNOSIS — Z170 Estrogen receptor positive status [ER+]: Secondary | ICD-10-CM

## 2014-04-30 LAB — CBC WITH DIFFERENTIAL/PLATELET
BASOS ABS: 0 10*3/uL (ref 0.0–0.1)
Basophils Relative: 0 % (ref 0–1)
Eosinophils Absolute: 0.2 10*3/uL (ref 0.0–0.7)
Eosinophils Relative: 3 % (ref 0–5)
HEMATOCRIT: 36.5 % (ref 36.0–46.0)
HEMOGLOBIN: 12.2 g/dL (ref 12.0–15.0)
LYMPHS PCT: 17 % (ref 12–46)
Lymphs Abs: 1 10*3/uL (ref 0.7–4.0)
MCH: 30.4 pg (ref 26.0–34.0)
MCHC: 33.4 g/dL (ref 30.0–36.0)
MCV: 91 fL (ref 78.0–100.0)
MONO ABS: 0.6 10*3/uL (ref 0.1–1.0)
Monocytes Relative: 12 % (ref 3–12)
NEUTROS ABS: 3.8 10*3/uL (ref 1.7–7.7)
NEUTROS PCT: 68 % (ref 43–77)
Platelets: 217 10*3/uL (ref 150–400)
RBC: 4.01 MIL/uL (ref 3.87–5.11)
RDW: 14 % (ref 11.5–15.5)
WBC: 5.5 10*3/uL (ref 4.0–10.5)

## 2014-04-30 LAB — COMPREHENSIVE METABOLIC PANEL
ALK PHOS: 66 U/L (ref 39–117)
ALT: 11 U/L (ref 0–35)
AST: 16 U/L (ref 0–37)
Albumin: 3.8 g/dL (ref 3.5–5.2)
Anion gap: 11 (ref 5–15)
BILIRUBIN TOTAL: 0.7 mg/dL (ref 0.3–1.2)
BUN: 17 mg/dL (ref 6–23)
CHLORIDE: 104 meq/L (ref 96–112)
CO2: 26 meq/L (ref 19–32)
CREATININE: 0.88 mg/dL (ref 0.50–1.10)
Calcium: 8.7 mg/dL (ref 8.4–10.5)
GFR calc Af Amer: 72 mL/min — ABNORMAL LOW (ref >90)
GFR, EST NON AFRICAN AMERICAN: 62 mL/min — AB (ref >90)
Glucose, Bld: 176 mg/dL — ABNORMAL HIGH (ref 70–99)
POTASSIUM: 4.1 meq/L (ref 3.7–5.3)
Sodium: 141 mEq/L (ref 137–147)
Total Protein: 7.5 g/dL (ref 6.0–8.3)

## 2014-04-30 MED ORDER — DENOSUMAB 60 MG/ML ~~LOC~~ SOLN
60.0000 mg | Freq: Once | SUBCUTANEOUS | Status: AC
  Administered 2014-04-30: 60 mg via SUBCUTANEOUS
  Filled 2014-04-30: qty 1

## 2014-04-30 NOTE — Patient Instructions (Signed)
[place] Discharge Instructions  RECOMMENDATIONS MADE BY THE CONSULTANT AND ANY TEST RESULTS WILL BE SENT TO YOUR REFERRING PHYSICIAN.  Return in 6 months for blood work, office visit, and Prolia injection  Thank you for choosing [facility] to provide your oncology and hematology care.  To afford each patient quality time with our providers, please arrive at least 15 minutes before your scheduled appointment time.  With your help, our goal is to use those 15 minutes to complete the necessary work-up to ensure our physicians have the information they need to help with your evaluation and healthcare recommendations.    Effective January 1st, 2014, we ask that you re-schedule your appointment with our physicians should you arrive 10 or more minutes late for your appointment.  We strive to give you quality time with our providers, and arriving late affects you and other patients whose appointments are after yours.    Again, thank you for choosing [facility].  Our hope is that these requests will decrease the amount of time that you wait before being seen by our physicians.       _____________________________________________________________  Should you have questions after your visit to [facility], please contact our office at [phone] between the hours of 8:30 a.m. and 4:30 p.m.  Voicemails left after 4:30 p.m. will not be returned until the following business day.  For prescription refill requests, have your pharmacy contact our office with your prescription refill request.    _______________________________________________________________  We hope that we have given you very good care.  You may receive a patient satisfaction survey in the mail, please complete it and return it as soon as possible.  We value your feedback!  _______________________________________________________________  Have you asked about our STAR program?   STAR stands for Survivorship Training and Rehabilitation, and this is a nationally recognized cancer care program that focuses on survivorship and rehabilitation.  Cancer and cancer treatments may cause problems, such as, pain, making you feel tired and keeping you from doing the things that you need or want to do. Cancer rehabilitation can help. Our goal is to reduce these troubling effects and help you have the best quality of life possible.  You may receive a survey from a nurse that asks questions about your current state of health.  Based on the survey results, all eligible patients will be referred to the [facility] program for an evaluation so we can better serve you!  A frequently asked questions sheet is available upon request.

## 2014-04-30 NOTE — Progress Notes (Signed)
Tamara [name]'s reason for visit today is for an injection and labs as scheduled per MD orders.  Labs were drawn prior to administration of ordered medication.   Tamara [name] also received prolia per MD orders; see [facility] for administration details.  Tamara [name] tolerated all procedures well and without incident; questions were answered and patient was discharged.

## 2014-04-30 NOTE — Progress Notes (Signed)
Labs for cmp,cbcd 

## 2014-05-21 ENCOUNTER — Ambulatory Visit (HOSPITAL_COMMUNITY)
Admission: RE | Admit: 2014-05-21 | Discharge: 2014-05-21 | Disposition: A | Discharge status: Home / Self Care | Payer: Medicare Other | Source: Ambulatory Visit | Attending: Internal Medicine | Admitting: Internal Medicine

## 2014-05-21 ENCOUNTER — Other Ambulatory Visit (HOSPITAL_COMMUNITY): Payer: Self-pay | Admitting: Internal Medicine

## 2014-05-21 DIAGNOSIS — M25562 Pain in left knee: Secondary | ICD-10-CM

## 2014-05-21 DIAGNOSIS — M179 Osteoarthritis of knee, unspecified: Secondary | ICD-10-CM | POA: Insufficient documentation

## 2014-06-26 DIAGNOSIS — C50919 Malignant neoplasm of unspecified site of unspecified female breast: Secondary | ICD-10-CM | POA: Insufficient documentation

## 2014-08-24 ENCOUNTER — Other Ambulatory Visit (HOSPITAL_COMMUNITY): Payer: Self-pay | Admitting: Oncology

## 2014-08-24 DIAGNOSIS — C50911 Malignant neoplasm of unspecified site of right female breast: Secondary | ICD-10-CM

## 2014-08-24 MED ORDER — EXEMESTANE 25 MG PO TABS
25.0000 mg | ORAL_TABLET | Freq: Every day | ORAL | Status: DC
Start: 2014-08-24 — End: 2015-02-23

## 2014-10-23 ENCOUNTER — Other Ambulatory Visit (HOSPITAL_COMMUNITY): Payer: Self-pay

## 2014-10-23 DIAGNOSIS — C50911 Malignant neoplasm of unspecified site of right female breast: Secondary | ICD-10-CM

## 2014-10-24 ENCOUNTER — Telehealth (HOSPITAL_COMMUNITY): Payer: Self-pay | Admitting: Hematology & Oncology

## 2014-10-24 NOTE — Telephone Encounter (Signed)
PC TO [place] [facility] DEPT SPOKE WITH COLETTE ([facility]) PT HAS A 300 COPAY FOR TIER 3/4 DRUGS WHICH HAS BEEN MEET COPAYS: TIER 1 $6 TIER 2 $17 TIER 3 $47 TIER 4 $97 GENERIC AROMASIN TIER 5 26% COST OF RX I EXPLAINED TO PT THAT COPAY WILL NOT CHANGE FROM $ 97 AND I WOULD LOOK TO SEE IF THERE IS ANY COPAY ASSIST OUT THERE FOR MEDICARE PT  [employer] [facility] [phone]

## 2014-10-30 ENCOUNTER — Encounter (HOSPITAL_BASED_OUTPATIENT_CLINIC_OR_DEPARTMENT_OTHER): Payer: Commercial Managed Care - HMO

## 2014-10-30 ENCOUNTER — Encounter (HOSPITAL_COMMUNITY): Payer: Self-pay | Admitting: Oncology

## 2014-10-30 ENCOUNTER — Telehealth (HOSPITAL_COMMUNITY): Payer: Self-pay | Admitting: Hematology & Oncology

## 2014-10-30 ENCOUNTER — Encounter (HOSPITAL_COMMUNITY): Payer: Commercial Managed Care - HMO

## 2014-10-30 ENCOUNTER — Encounter (HOSPITAL_COMMUNITY): Payer: Commercial Managed Care - HMO | Attending: Oncology | Admitting: Oncology

## 2014-10-30 VITALS — BP 120/67 | HR 92 | Temp 98.7°F | Resp 18 | Wt 156.0 lb

## 2014-10-30 DIAGNOSIS — C50911 Malignant neoplasm of unspecified site of right female breast: Secondary | ICD-10-CM

## 2014-10-30 DIAGNOSIS — M810 Age-related osteoporosis without current pathological fracture: Secondary | ICD-10-CM

## 2014-10-30 DIAGNOSIS — Z170 Estrogen receptor positive status [ER+]: Secondary | ICD-10-CM | POA: Diagnosis not present

## 2014-10-30 LAB — CBC WITH DIFFERENTIAL/PLATELET
Basophils Absolute: 0 10*3/uL (ref 0.0–0.1)
Basophils Relative: 0 % (ref 0–1)
Eosinophils Absolute: 0.1 10*3/uL (ref 0.0–0.7)
Eosinophils Relative: 2 % (ref 0–5)
HCT: 36.8 % (ref 36.0–46.0)
HEMOGLOBIN: 12.2 g/dL (ref 12.0–15.0)
LYMPHS ABS: 0.9 10*3/uL (ref 0.7–4.0)
LYMPHS PCT: 13 % (ref 12–46)
MCH: 30.5 pg (ref 26.0–34.0)
MCHC: 33.2 g/dL (ref 30.0–36.0)
MCV: 92 fL (ref 78.0–100.0)
MONOS PCT: 12 % (ref 3–12)
Monocytes Absolute: 0.8 10*3/uL (ref 0.1–1.0)
NEUTROS ABS: 4.8 10*3/uL (ref 1.7–7.7)
Neutrophils Relative %: 73 % (ref 43–77)
Platelets: 209 10*3/uL (ref 150–400)
RBC: 4 MIL/uL (ref 3.87–5.11)
RDW: 14.3 % (ref 11.5–15.5)
WBC: 6.6 10*3/uL (ref 4.0–10.5)

## 2014-10-30 LAB — COMPREHENSIVE METABOLIC PANEL
ALT: 13 U/L (ref 0–35)
AST: 19 U/L (ref 0–37)
Albumin: 4 g/dL (ref 3.5–5.2)
Alkaline Phosphatase: 65 U/L (ref 39–117)
Anion gap: 7 (ref 5–15)
BUN: 21 mg/dL (ref 6–23)
CO2: 28 mmol/L (ref 19–32)
CREATININE: 0.94 mg/dL (ref 0.50–1.10)
Calcium: 8.9 mg/dL (ref 8.4–10.5)
Chloride: 107 mmol/L (ref 96–112)
GFR calc non Af Amer: 57 mL/min — ABNORMAL LOW (ref >90)
GFR, EST AFRICAN AMERICAN: 66 mL/min — AB (ref >90)
GLUCOSE: 129 mg/dL — AB (ref 70–99)
Potassium: 3.8 mmol/L (ref 3.5–5.1)
SODIUM: 142 mmol/L (ref 135–145)
Total Bilirubin: 0.6 mg/dL (ref 0.3–1.2)
Total Protein: 7.3 g/dL (ref 6.0–8.3)

## 2014-10-30 MED ORDER — DENOSUMAB 60 MG/ML ~~LOC~~ SOLN
60.0000 mg | Freq: Once | SUBCUTANEOUS | Status: AC
  Administered 2014-10-30: 60 mg via SUBCUTANEOUS
  Filled 2014-10-30: qty 1

## 2014-10-30 NOTE — Assessment & Plan Note (Addendum)
On Prolia.  Prolia is due today.  Repeat in 6 months.   Continue Ca++ and Vit D daily.   Next bone density is due in September 2016.

## 2014-10-30 NOTE — Telephone Encounter (Signed)
Faxed app to [employer] for copay assist

## 2014-10-30 NOTE — Progress Notes (Signed)
Tamara [name], [address] [place] [address]  Breast cancer, right breast  Osteoporosis  CURRENT THERAPY: Aromasin daily beginning on 04/28/2013 which she will take for 5 years. Also on Prolia every 6 months beginning on 04/26/2013.  INTERVAL HISTORY: Tamara [name] 81 y.o. female returns for followup of Stage IA (T1b, N0, M0) invasive mucinous breast carcinoma, S/P right breast lumpectomy by Dr. Arnoldo Morale on 03/08/2013 with 1 negative sentinel node. Cancer was identified as ER+ 100%, PR+ 85%, Her2 negative, and Ki-67 marker at 17%. Oncotype Dx score of 20 with 13% 10 year risk of distant recurrence and absolute benefit of chemotherapy at 10 years in this risk category is negligible and less than 1%.  AND  Osteoporosis, on Prolia every 6 months.     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  Her2 negative, ER 100%, PR 85%, Ki-67 17%.   03/08/2013 Surgery Right lumpectomy with sentinel node biopsy revealing adjacent DCIS (intermediate grade).  Lymph node is negative for disease.  T1b N0.   04/28/2013 -  Chemotherapy Aromasin daily.  She will take for 5 years.    I personally reviewed and went over laboratory results with the patient.  The results are noted within this dictation.  Labs are stable and she is able to receive her Prolia injection today.  I provided her information regarding BCI testing and the opportunity to provide [place] information regarding the utility of extended endocrine therapy.  She wishes to consider this option and we will address again in the future.   Oncologically, she denies any complaints and ROS questioning is negative.    Past Medical History  Diagnosis Date  . Hypertension   . Cancer   . Claustrophobia   . Claustrophobia   . Breast cancer   . Osteoporosis   . Breast cancer, right breast 04/25/2013    has Osteoporosis; COUGH, CHRONIC; and Breast  cancer, right breast on her problem list.     has No Known Allergies.  Ms. Hout does not currently have medications on file.  Past Surgical History  Procedure Laterality Date  . Abdominal hysterectomy    . Cataract extraction, bilateral    . Partial mastectomy with needle localization and axillary sentinel lymph node bx Right 03/08/2013    Procedure: PARTIAL MASTECTOMY WITH NEEDLE LOCALIZATION AND AXILLARY SENTINEL LYMPH NODE BX;  Surgeon: Jamesetta So, MD;  Location: [facility];  Service: General;  Laterality: Right;  Sentinel Node Bx @ 7:30am Needle Loc @ 8:00am    Denies any headaches, dizziness, double vision, fevers, chills, night sweats, nausea, vomiting, diarrhea, constipation, chest pain, heart palpitations, shortness of breath, blood in stool, black tarry stool, urinary pain, urinary burning, urinary frequency, hematuria.   PHYSICAL EXAMINATION  ECOG PERFORMANCE STATUS: 0 - Asymptomatic  Filed Vitals:   10/30/14 1100  BP: 120/67  Pulse: 92  Temp: 98.7 F (37.1 C)  Resp: 18    GENERAL:alert, no distress, well nourished, well developed, comfortable, cooperative and smiling SKIN: skin color, texture, turgor are normal, no rashes or significant lesions HEAD: Normocephalic, No masses, lesions, tenderness or abnormalities EYES: normal, PERRLA, EOMI, Conjunctiva are pink and non-injected EARS: External ears normal OROPHARYNX:lips, buccal mucosa, and tongue normal and mucous membranes are moist  NECK: supple, no adenopathy, thyroid normal size, non-tender, without nodularity, no stridor, non-tender, trachea midline LYMPH:  no palpable lymphadenopathy, no hepatosplenomegaly BREAST:breasts appear normal,  no suspicious masses, no skin or nipple changes or axillary nodes LUNGS: clear to auscultation and percussion HEART: regular rate & rhythm, no murmurs, no gallops, S1 normal and S2 normal ABDOMEN:abdomen soft, non-tender, obese, normal bowel sounds, no masses or organomegaly and  no hepatosplenomegaly BACK: Back symmetric, no curvature., No CVA tenderness EXTREMITIES:less then 2 second capillary refill, no joint deformities, effusion, or inflammation, no edema, no skin discoloration, no clubbing, no cyanosis  NEURO: alert & oriented x 3 with fluent speech, no focal motor/sensory deficits, gait normal   LABORATORY DATA: CBC    Component Value Date/Time   WBC 6.6 10/30/2014 1100   RBC 4.00 10/30/2014 1100   HGB 12.2 10/30/2014 1100   HCT 36.8 10/30/2014 1100   PLT 209 10/30/2014 1100   MCV 92.0 10/30/2014 1100   MCH 30.5 10/30/2014 1100   MCHC 33.2 10/30/2014 1100   RDW 14.3 10/30/2014 1100   LYMPHSABS 0.9 10/30/2014 1100   MONOABS 0.8 10/30/2014 1100   EOSABS 0.1 10/30/2014 1100   BASOSABS 0.0 10/30/2014 1100      Chemistry      Component Value Date/Time   NA 142 10/30/2014 1100   K 3.8 10/30/2014 1100   CL 107 10/30/2014 1100   CO2 28 10/30/2014 1100   BUN 21 10/30/2014 1100   CREATININE 0.94 10/30/2014 1100      Component Value Date/Time   CALCIUM 8.9 10/30/2014 1100   ALKPHOS 65 10/30/2014 1100   AST 19 10/30/2014 1100   ALT 13 10/30/2014 1100   BILITOT 0.6 10/30/2014 1100        ASSESSMENT AND PLAN:  Breast cancer, right breast Stage IA (T1b, N0, M0) invasive mucinous breast carcinoma, S/P right breast lumpectomy by Dr. Arnoldo Morale on 03/08/2013 with 1 negative sentinel node. Cancer was identified as ER+ 100%, PR+ 85%, Her2 negative, and Ki-67 marker at 17%. Oncotype Dx score of 20 with 13% 10 year risk of distant recurrence and absolute benefit of chemotherapy at 10 years in this risk category is negligible and less than 1%.  I spent time with the patient discussing Breast Cancer Index testing to help identify the benefit to extended endocrine therapy 5 versus 10 years.    Continue Aromasin daily, which started on 04/28/2013.  Labs today as ordered.    Return in 6 months for follow-up and repeat labs.    Osteoporosis On Prolia.   Prolia is due today.  Repeat in 6 months.   Continue Ca++ and Vit D daily.   Next bone density is due in September 2016.      THERAPY PLAN:  Continue Aromasin daily and compliance reinforced today.  Continue Prolia for osteoporosis.  All questions were answered. The patient knows to call the clinic with any problems, questions or concerns. We can certainly see the patient much sooner if necessary.  Patient and plan discussed with Dr. Ancil Linsey and she is in agreement with the aforementioned.   This note is electronically signed by: Robynn Pane 10/30/2014 11:35 AM

## 2014-10-30 NOTE — Progress Notes (Signed)
Please see doctors encounter for more information 

## 2014-10-30 NOTE — Progress Notes (Signed)
Labs drawn

## 2014-10-30 NOTE — Assessment & Plan Note (Addendum)
Stage IA (T1b, N0, M0) invasive mucinous breast carcinoma, S/P right breast lumpectomy by Dr. Jenkins on 03/08/2013 with 1 negative sentinel node. Cancer was identified as ER+ 100%, PR+ 85%, Her2 negative, and Ki-67 marker at 17%. Oncotype Dx score of 20 with 13% 10 year risk of distant recurrence and absolute benefit of chemotherapy at 10 years in this risk category is negligible and less than 1%.  I spent time with the patient discussing Breast Cancer Index testing to help identify the benefit to extended endocrine therapy 5 versus 10 years.  She wants to think about this test and therefore, I will not order it today.  She will let us know if she is interested in the future and she requests we discuss with her again in the future.  Next mammogram is due in June 2016.  Continue Aromasin daily, which started on 04/28/2013.  Labs today as ordered.    Return in 6 months for follow-up and repeat labs.  

## 2014-10-30 NOTE — Progress Notes (Signed)
Tamara [name] presents today for injection per the provider's orders.  Prolia administration without incident; see MAR for injection details.  Patient tolerated procedure well and without incident.  No questions or complaints noted at this time.

## 2014-10-30 NOTE — Patient Instructions (Addendum)
[place] at [facility] Discharge Instructions  RECOMMENDATIONS MADE BY THE CONSULTANT AND ANY TEST RESULTS WILL BE SENT TO YOUR REFERRING PHYSICIAN.  Return in 6 months for blood work and office visit with Dr. Whitney Muse.   Thank you for choosing [place] at [facility] to provide your oncology and hematology care.  To afford each patient quality time with our provider, please arrive at least 15 minutes before your scheduled appointment time.    You need to re-schedule your appointment should you arrive 10 or more minutes late.  We strive to give you quality time with our providers, and arriving late affects you and other patients whose appointments are after yours.  Also, if you no show three or more times for appointments you may be dismissed from the clinic at the providers discretion.     Again, thank you for choosing [facility].  Our hope is that these requests will decrease the amount of time that you wait before being seen by our physicians.       _____________________________________________________________  Should you have questions after your visit to [facility], please contact our office at [phone] between the hours of 8:30 a.m. and 4:30 p.m.  Voicemails left after 4:30 p.m. will not be returned until the following business day.  For prescription refill requests, have your pharmacy contact our office.

## 2014-11-09 NOTE — Op Note (Signed)
PATIENT NAME:  Tamara [name], Tamara [name] MR#:  [id number] DATE OF BIRTH:  02/24/1934  DATE OF PROCEDURE:  02/14/2013  PREOPERATIVE DIAGNOSIS: Visually significant cataract of the right eye.   POSTOPERATIVE DIAGNOSIS: Visually significant cataract of the right eye.   OPERATIVE PROCEDURE: Cataract extraction by phacoemulsification with implant of intraocular lens to right eye.   SURGEON: Birder Robson, MD.   ANESTHESIA:  1. Managed anesthesia care.  2. Topical tetracaine drops followed by 2% Xylocaine jelly applied in the preoperative holding area.   COMPLICATIONS: None.   TECHNIQUE:  Stop and chop.   DESCRIPTION OF PROCEDURE: The patient was examined and consented in the preoperative holding area where the aforementioned topical anesthesia was applied to the right eye and then brought back to the Operating Room where the right eye was prepped and draped in the usual sterile ophthalmic fashion and a lid speculum was placed. A paracentesis was created with the side port blade and the anterior chamber was filled with viscoelastic. A near clear corneal incision was performed with the steel keratome. A continuous curvilinear capsulorrhexis was performed with a cystotome followed by the capsulorrhexis forceps. Hydrodissection and hydrodelineation were carried out with BSS on a blunt cannula. The lens was removed in a stop and chop technique and the remaining cortical material was removed with the irrigation-aspiration handpiece. The capsular bag was inflated with viscoelastic and the Tecnis ZCB00 30.0-diopter lens, serial number [id number] was placed in the capsular bag without complication. The remaining viscoelastic was removed from the eye with the irrigation-aspiration handpiece. The wounds were hydrated. The anterior chamber was flushed with Miostat and the eye was inflated to physiologic pressure. 0.1 mL of cefuroxime concentration 10 mg/mL was placed in the anterior chamber. The wounds were found to be  water tight. The eye was dressed with Vigamox. The patient was given protective glasses to wear throughout the day and a shield with which to sleep tonight. The patient was also given drops with which to begin a drop regimen today and will follow-up with me in one day.    ____________________________ Livingston Diones. Dajiah Kooi, MD wlp:OSi D: 02/14/2013 11:13:26 ET T: 02/14/2013 11:24:42 ET JOB#: [id number]  cc: Jathan Balling L. Nero Sawatzky, MD, <Dictator> Livingston Diones Saahas Hidrogo MD ELECTRONICALLY SIGNED 02/17/2013 10:08

## 2014-12-11 ENCOUNTER — Other Ambulatory Visit (HOSPITAL_COMMUNITY): Payer: Self-pay | Admitting: Internal Medicine

## 2014-12-11 DIAGNOSIS — Z9889 Other specified postprocedural states: Secondary | ICD-10-CM

## 2014-12-27 ENCOUNTER — Other Ambulatory Visit (HOSPITAL_COMMUNITY): Payer: Self-pay | Admitting: Internal Medicine

## 2014-12-27 ENCOUNTER — Ambulatory Visit (HOSPITAL_COMMUNITY)
Admission: RE | Admit: 2014-12-27 | Discharge: 2014-12-27 | Disposition: A | Discharge status: Home / Self Care | Payer: Commercial Managed Care - HMO | Source: Ambulatory Visit | Attending: Internal Medicine | Admitting: Internal Medicine

## 2014-12-27 DIAGNOSIS — J84112 Idiopathic pulmonary fibrosis: Secondary | ICD-10-CM | POA: Insufficient documentation

## 2015-01-15 ENCOUNTER — Ambulatory Visit (HOSPITAL_COMMUNITY)
Admission: RE | Admit: 2015-01-15 | Discharge: 2015-01-15 | Disposition: A | Discharge status: Home / Self Care | Payer: Commercial Managed Care - HMO | Source: Ambulatory Visit | Attending: Internal Medicine | Admitting: Internal Medicine

## 2015-01-15 DIAGNOSIS — Z9889 Other specified postprocedural states: Secondary | ICD-10-CM

## 2015-01-15 DIAGNOSIS — Z923 Personal history of irradiation: Secondary | ICD-10-CM | POA: Insufficient documentation

## 2015-01-15 DIAGNOSIS — Z853 Personal history of malignant neoplasm of breast: Secondary | ICD-10-CM | POA: Diagnosis present

## 2015-02-23 ENCOUNTER — Other Ambulatory Visit (HOSPITAL_COMMUNITY): Payer: Self-pay | Admitting: Oncology

## 2015-04-08 ENCOUNTER — Ambulatory Visit (HOSPITAL_COMMUNITY)
Admission: RE | Admit: 2015-04-08 | Discharge: 2015-04-08 | Disposition: A | Discharge status: Home / Self Care | Payer: Commercial Managed Care - HMO | Source: Ambulatory Visit | Attending: Oncology | Admitting: Oncology

## 2015-04-08 DIAGNOSIS — M810 Age-related osteoporosis without current pathological fracture: Secondary | ICD-10-CM | POA: Insufficient documentation

## 2015-04-25 ENCOUNTER — Other Ambulatory Visit (HOSPITAL_COMMUNITY): Payer: Self-pay

## 2015-04-25 DIAGNOSIS — M810 Age-related osteoporosis without current pathological fracture: Secondary | ICD-10-CM

## 2015-04-25 DIAGNOSIS — C50911 Malignant neoplasm of unspecified site of right female breast: Secondary | ICD-10-CM

## 2015-05-01 ENCOUNTER — Encounter (HOSPITAL_BASED_OUTPATIENT_CLINIC_OR_DEPARTMENT_OTHER): Payer: Commercial Managed Care - HMO

## 2015-05-01 ENCOUNTER — Encounter (HOSPITAL_COMMUNITY): Payer: Self-pay | Admitting: Hematology & Oncology

## 2015-05-01 ENCOUNTER — Encounter (HOSPITAL_COMMUNITY): Payer: Commercial Managed Care - HMO | Attending: Hematology & Oncology | Admitting: Hematology & Oncology

## 2015-05-01 VITALS — BP 120/69 | HR 88 | Temp 98.3°F | Resp 16 | Wt 154.4 lb

## 2015-05-01 DIAGNOSIS — M810 Age-related osteoporosis without current pathological fracture: Secondary | ICD-10-CM | POA: Insufficient documentation

## 2015-05-01 DIAGNOSIS — M79621 Pain in right upper arm: Secondary | ICD-10-CM | POA: Diagnosis not present

## 2015-05-01 DIAGNOSIS — C50411 Malignant neoplasm of upper-outer quadrant of right female breast: Secondary | ICD-10-CM | POA: Diagnosis not present

## 2015-05-01 DIAGNOSIS — C50911 Malignant neoplasm of unspecified site of right female breast: Secondary | ICD-10-CM

## 2015-05-01 DIAGNOSIS — Z170 Estrogen receptor positive status [ER+]: Secondary | ICD-10-CM

## 2015-05-01 LAB — COMPREHENSIVE METABOLIC PANEL
ALK PHOS: 64 U/L (ref 38–126)
ALT: 12 U/L — ABNORMAL LOW (ref 14–54)
AST: 20 U/L (ref 15–41)
Albumin: 4 g/dL (ref 3.5–5.0)
Anion gap: 6 (ref 5–15)
BUN: 21 mg/dL — ABNORMAL HIGH (ref 6–20)
CALCIUM: 8.8 mg/dL — AB (ref 8.9–10.3)
CO2: 27 mmol/L (ref 22–32)
Chloride: 108 mmol/L (ref 101–111)
Creatinine, Ser: 1.06 mg/dL — ABNORMAL HIGH (ref 0.44–1.00)
GFR calc Af Amer: 57 mL/min — ABNORMAL LOW (ref >60)
GFR calc non Af Amer: 49 mL/min — ABNORMAL LOW (ref >60)
Glucose, Bld: 109 mg/dL — ABNORMAL HIGH (ref 65–99)
Potassium: 3.8 mmol/L (ref 3.5–5.1)
SODIUM: 141 mmol/L (ref 135–145)
TOTAL PROTEIN: 7.1 g/dL (ref 6.5–8.1)
Total Bilirubin: 0.8 mg/dL (ref 0.3–1.2)

## 2015-05-01 LAB — CBC WITH DIFFERENTIAL/PLATELET
Basophils Absolute: 0 10*3/uL (ref 0.0–0.1)
Basophils Relative: 0 %
EOS ABS: 0.1 10*3/uL (ref 0.0–0.7)
EOS PCT: 1 %
HCT: 37 % (ref 36.0–46.0)
HEMOGLOBIN: 12.1 g/dL (ref 12.0–15.0)
LYMPHS ABS: 1.4 10*3/uL (ref 0.7–4.0)
Lymphocytes Relative: 21 %
MCH: 30 pg (ref 26.0–34.0)
MCHC: 32.7 g/dL (ref 30.0–36.0)
MCV: 91.6 fL (ref 78.0–100.0)
MONO ABS: 0.7 10*3/uL (ref 0.1–1.0)
MONOS PCT: 10 %
NEUTROS PCT: 68 %
Neutro Abs: 4.7 10*3/uL (ref 1.7–7.7)
Platelets: 222 10*3/uL (ref 150–400)
RBC: 4.04 MIL/uL (ref 3.87–5.11)
RDW: 14.5 % (ref 11.5–15.5)
WBC: 6.9 10*3/uL (ref 4.0–10.5)

## 2015-05-01 MED ORDER — DENOSUMAB 60 MG/ML ~~LOC~~ SOLN
60.0000 mg | Freq: Once | SUBCUTANEOUS | Status: AC
  Administered 2015-05-01: 60 mg via SUBCUTANEOUS
  Filled 2015-05-01: qty 1

## 2015-05-01 NOTE — Progress Notes (Signed)
..  Tamara [name] presents today for injection per the provider's orders.  prolia administration without incident; see MAR for injection details.  Patient tolerated procedure well and without incident.  No questions or complaints noted at this time.

## 2015-05-01 NOTE — Patient Instructions (Signed)
..  [place] at [facility] Discharge Instructions  RECOMMENDATIONS MADE BY THE CONSULTANT AND ANY TEST RESULTS WILL BE SENT TO YOUR REFERRING PHYSICIAN.  I think this is just a lipoma, fatty lump under your arm We will get an ultrasound to be sure prolia today and again in 6 months Return for labs and to see [place] in 6 months  Thank you for choosing [place] at [facility] to provide your oncology and hematology care.  To afford each patient quality time with our provider, please arrive at least 15 minutes before your scheduled appointment time.    You need to re-schedule your appointment should you arrive 10 or more minutes late.  We strive to give you quality time with our providers, and arriving late affects you and other patients whose appointments are after yours.  Also, if you no show three or more times for appointments you may be dismissed from the clinic at the providers discretion.     Again, thank you for choosing [facility].  Our hope is that these requests will decrease the amount of time that you wait before being seen by our physicians.       _____________________________________________________________  Should you have questions after your visit to [facility], please contact our office at [phone] between the hours of 8:30 a.m. and 4:30 p.m.  Voicemails left after 4:30 p.m. will not be returned until the following business day.  For prescription refill requests, have your pharmacy contact our office.

## 2015-05-01 NOTE — Progress Notes (Signed)
Tamara [name], [address] [place] [address]  Malignant neoplasm of right female breast, unspecified site of breast [facility]) - Plan: US Breast Complete Uni Right Inc Axilla  Osteoporosis - Plan: SCHEDULING COMMUNICATION, denosumab (PROLIA) injection 60 mg  CURRENT THERAPY: Aromasin daily beginning on 04/28/2013 which she will take for 5 years. Also on Prolia every 6 months beginning on 04/26/2013.  INTERVAL HISTORY: Tamara [name] 81 y.o. female returns for followup of Stage IA (T1b, N0, M0) invasive mucinous breast carcinoma, S/P right breast lumpectomy by Dr. Arnoldo Morale on 03/08/2013 with 1 negative sentinel node. Cancer was identified as ER+ 100%, PR+ 85%, Her2 negative, and Ki-67 marker at 17%. Oncotype Dx score of 20 with 13% 10 year risk of distant recurrence and absolute benefit of chemotherapy at 10 years in this risk category is negligible and less than 1%.  AND  Osteoporosis, on Prolia every 6 months.      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  Her2 negative, ER 100%, PR 85%, Ki-67 17%.   03/08/2013 Surgery Right lumpectomy with sentinel node biopsy revealing adjacent DCIS (intermediate grade).  Lymph node is negative for disease.  T1b N0.   04/28/2013 -  Chemotherapy Aromasin daily.  She will take for 5 years.    Oncologically, she denies any complaints and ROS questioning is negative.    Tamara [name] is here alone today. She is a very pleasant and cheerful woman, chuckling often.  She has shown significant bone density improvement on DEXA from 04/08/2015. She is also good on her mammogram, from June. She takes calcium and vitamin D every day as well.  She recently had cataract surgery and states she "cannot believe how well she can see." She went to a doctor in [place] for this.  When asked in reference to the palpable lump under her arm, she states that her  right arm gets sore, but this is a chronic issue.  She confirms that her bowels are okay, no blood in her stool, no constipation or diarrhea that's new. She states that she doesn't think she's ever had a screening colonoscopy.  Her primary care physician is Dr. Willey Blade.   Past Medical History  Diagnosis Date  . Hypertension   . Cancer ([place])   . Claustrophobia   . Claustrophobia   . Breast cancer ([place])   . Osteoporosis   . Breast cancer, right breast ([place]) 04/25/2013    has Osteoporosis; COUGH, CHRONIC; and Breast cancer, right breast ([place]) on her problem list.     has No Known Allergies.  Tamara [name] does not currently have medications on file.  Past Surgical History  Procedure Laterality Date  . Abdominal hysterectomy    . Cataract extraction, bilateral    . Partial mastectomy with needle localization and axillary sentinel lymph node bx Right 03/08/2013    Procedure: PARTIAL MASTECTOMY WITH NEEDLE LOCALIZATION AND AXILLARY SENTINEL LYMPH NODE BX;  Surgeon: Jamesetta So, MD;  Location: [facility];  Service: General;  Laterality: Right;  Sentinel Node Bx @ 7:30am Needle Loc @ 8:00am   Social History Her husband is deceased. She lives with her daughter and her granddaughter, and has a son in [place].  Review of Systems Denies any headaches, dizziness, double vision, fevers, chills, night sweats, nausea, vomiting, diarrhea, constipation, chest pain, heart palpitations, shortness of breath, blood in stool, black tarry stool, urinary pain, urinary  burning, urinary frequency, hematuria.  14 point review of systems was performed and is negative except as detailed under history of present illness and above    PHYSICAL EXAMINATION  ECOG PERFORMANCE STATUS: 0 - Asymptomatic  Filed Vitals:   05/01/15 1307  BP: 120/69  Pulse: 88  Temp: 98.3 F (36.8 C)  Resp: 16    GENERAL:alert, no distress, well nourished, well developed, comfortable, cooperative and smiling SKIN: skin color,  texture, turgor are normal, no rashes or significant lesions HEAD: Normocephalic, No masses, lesions, tenderness or abnormalities EYES: normal, PERRLA, EOMI, Conjunctiva are pink and non-injected EARS: External ears normal OROPHARYNX:lips, buccal mucosa, and tongue normal and mucous membranes are moist  NECK: supple, no adenopathy, thyroid normal size, non-tender, without nodularity, no stridor, non-tender, trachea midline LYMPH:  no palpable lymphadenopathy, no hepatosplenomegaly BREAST:breasts appear normal, no suspicious masses, no skin or nipple changes or axillary nodes Palpable mass under R axillae, mobile, 3 cm in largest dimension LUNGS: clear to auscultation and percussion HEART: regular rate & rhythm, no murmurs, no gallops, S1 normal and S2 normal ABDOMEN:abdomen soft, non-tender, obese, normal bowel sounds, no masses or organomegaly and no hepatosplenomegaly BACK: Back symmetric, no curvature., No CVA tenderness EXTREMITIES:less then 2 second capillary refill, no joint deformities, effusion, or inflammation, no edema, no skin discoloration, no clubbing, no cyanosis  NEURO: alert & oriented x 3 with fluent speech, no focal motor/sensory deficits, gait normal  RADIOLOGY: CLINICAL DATA: Postmenopausal. Osteoporosis. History of breast cancer. On Prolia and calcium  EXAM: DUAL X-RAY ABSORPTIOMETRY (DXA) FOR BONE MINERAL DENSITY  FINDINGS: LEFT FOREARM (1/3 RADIUS)  Bone Mineral Density (BMD): 0.591  Young Adult T Score: -1.7  Z Score: 0.9  LEFT FEMUR NECK  Bone Mineral Density (BMD): 0.657 g/cm2  Young Adult T-Score: -2.7  Z-Score: -0.8  ASSESSMENT: Patient's diagnostic category is OSTEOPOROSIS by WHO Criteria.  FRACTURE RISK: INCREASED  FRAX: Not calculated because patient has osteoporosis.  COMPARISON: There has been a statistically significant IMPROVEMENT compared to patient's previous exam of 03/24/2013.  Effective therapies are  available in the form of bisphosphonates, selective estrogen receptor modulators, biologic agents, and hormone replacement therapy (for women). All patients should ensure an adequate intake of dietary calcium (1200 mg daily) and vitamin D (800 IU daily) unless contraindicated.  All treatment decisions require clinical judgment and consideration of individual patient factors, including patient preferences, co-morbidities, previous drug use, risk factors not captured in the FRAX model (e.g., frailty, falls, vitamin D deficiency, increased bone turnover, interval significant decline in bone density) and possible under- or over-estimation of fracture risk by FRAX.  The National Osteoporosis Foundation recommends that FDA-approved medical therapies be considered in postmenopausal women and men age 38 or older with a:  1. Hip or vertebral (clinical or morphometric) fracture.  2. T-score of -2.5 or lower at the spine or hip.  3. Ten-year fracture probability by FRAX of 3% or greater for hip fracture or 20% or greater for major osteoporotic fracture.  People with diagnosed cases of osteoporosis or at high risk for fracture should have regular bone mineral density tests. For patients eligible for Medicare, routine testing is allowed once every 2 years. The testing frequency can be increased to one year for patients who have rapidly progressing disease, those who are receiving or discontinuing medical therapy to restore bone mass, or have additional risk factors.  World Health Organization [facility]) Criteria:  Normal: T-scores from +1.0 to -1.0  Low Bone Mass (Osteopenia): T-scores between -1.0 and -2.5  Osteoporosis: T-scores -2.5 and below  Comparison to Reference Population:  T-score is the key measure used in the diagnosis of osteoporosis and relative risk determination for fracture. It provides a value for bone mass relative to the mean bone mass of a young adult  reference population expressed in terms of standard deviation (SD).  Z-score is the age-matched score showing the patient's values compared to a population matched for age, sex, and race. This is also expressed in terms of standard deviation. The patient may have values that compare favorably to the age-matched values and still be at increased risk for fracture.   Electronically Signed  By: Franki Cabot M.D.  On: 04/08/2015 11:17   LABORATORY DATA: I have reviewed the data as listed  CBC    Component Value Date/Time   WBC 6.9 05/01/2015 1249   RBC 4.04 05/01/2015 1249   HGB 12.1 05/01/2015 1249   HCT 37.0 05/01/2015 1249   PLT 222 05/01/2015 1249   MCV 91.6 05/01/2015 1249   MCH 30.0 05/01/2015 1249   MCHC 32.7 05/01/2015 1249   RDW 14.5 05/01/2015 1249   LYMPHSABS 1.4 05/01/2015 1249   MONOABS 0.7 05/01/2015 1249   EOSABS 0.1 05/01/2015 1249   BASOSABS 0.0 05/01/2015 1249      Chemistry      Component Value Date/Time   NA 141 05/01/2015 1249   K 3.8 05/01/2015 1249   CL 108 05/01/2015 1249   CO2 27 05/01/2015 1249   BUN 21* 05/01/2015 1249   CREATININE 1.06* 05/01/2015 1249      Component Value Date/Time   CALCIUM 8.8* 05/01/2015 1249   ALKPHOS 64 05/01/2015 1249   AST 20 05/01/2015 1249   ALT 12* 05/01/2015 1249   BILITOT 0.8 05/01/2015 1249      ASSESSMENT AND PLAN:  Stage IA (T1b, N0, M0) invasive mucinous breast carcinoma, S/P right breast lumpectomy by Dr. Arnoldo Morale on 03/08/2013 with 1 negative sentinel node.  ER+ PR+ HER 2 - Osteoporosis on prolia, calcium and vitamin D (note improvement in bone density on recent DEXA) AI therapy  I am going to send patient for an ultrasound of the axillary region. I suspect that I am palpating a large lipoma however given her history of breast cancer this needs further investigation and she is agreeable.  She will continue on her current Aromasin, she again was encouraged to take it daily. She will continue  prolia for her osteoporosis. We reviewed her recent DEXA which showed improvement in her bone density.  [place] was given today, calcium level was 8.8 mg/dl, just below normal range, she will be advised to take extra calcium for one week, and return for repeat calcium level. Pharmacy will be advised  We will see her back in 6 months with labs and physical exam.   Mammogram will be due in June of next year.  Orders Placed This Encounter  Procedures  . US BREAST LTD UNI RIGHT INC AXILLA    Standing Status: Future     Number of Occurrences:      Standing Expiration Date: 07/02/2016    Order Specific Question:  Reason for Exam (SYMPTOM  OR DIAGNOSIS REQUIRED)    Answer:  palpable abnormality R axillae, history breast ca    Order Specific Question:  Preferred imaging location?    Answer:  [facility]    Schedule 15 minute injection appointment.  Cannot select a 6 month frequency so will want to set up another  visit in 6 months.   All questions were answered. The patient knows to call the clinic with any problems, questions or concerns. We can certainly see the patient much sooner if necessary.  This document serves as a record of services personally performed by Ancil Linsey, MD. It was created on her behalf by Toni Amend, a trained medical scribe. The creation of this record is based on the scribe's personal observations and the provider's statements to them. This document has been checked and approved by the attending provider.  I have reviewed the above documentation for accuracy and completeness, and I agree with the above.  This note is electronically signed [email or web address] Cyril Mourning, MD  05/01/2015 3:27 PM

## 2015-05-01 NOTE — Progress Notes (Signed)
See office visit encounter. 

## 2015-05-02 NOTE — Progress Notes (Signed)
LABS DRAWN

## 2015-05-03 ENCOUNTER — Other Ambulatory Visit (HOSPITAL_COMMUNITY): Payer: Self-pay | Admitting: *Deleted

## 2015-05-03 DIAGNOSIS — C50911 Malignant neoplasm of unspecified site of right female breast: Secondary | ICD-10-CM

## 2015-05-03 NOTE — Progress Notes (Signed)
Patient notified to take an extra Calcium pill daily and to come in and have calcium level checked on 05/10/15. She said ok and said she was writing it down.

## 2015-05-07 ENCOUNTER — Ambulatory Visit (HOSPITAL_COMMUNITY)
Admission: RE | Admit: 2015-05-07 | Discharge: 2015-05-07 | Disposition: A | Discharge status: Home / Self Care | Payer: Commercial Managed Care - HMO | Source: Ambulatory Visit | Attending: Hematology & Oncology | Admitting: Hematology & Oncology

## 2015-05-07 DIAGNOSIS — Z853 Personal history of malignant neoplasm of breast: Secondary | ICD-10-CM | POA: Insufficient documentation

## 2015-05-07 DIAGNOSIS — R2231 Localized swelling, mass and lump, right upper limb: Secondary | ICD-10-CM | POA: Diagnosis not present

## 2015-05-07 DIAGNOSIS — C50911 Malignant neoplasm of unspecified site of right female breast: Secondary | ICD-10-CM

## 2015-05-10 ENCOUNTER — Encounter (HOSPITAL_BASED_OUTPATIENT_CLINIC_OR_DEPARTMENT_OTHER): Payer: Commercial Managed Care - HMO

## 2015-05-10 DIAGNOSIS — C50911 Malignant neoplasm of unspecified site of right female breast: Secondary | ICD-10-CM

## 2015-05-10 DIAGNOSIS — C50411 Malignant neoplasm of upper-outer quadrant of right female breast: Secondary | ICD-10-CM | POA: Diagnosis not present

## 2015-05-10 LAB — CALCIUM: Calcium: 8.3 mg/dL — ABNORMAL LOW (ref 8.9–10.3)

## 2015-05-10 NOTE — Progress Notes (Signed)
LABS DRAWN

## 2015-05-14 ENCOUNTER — Telehealth (HOSPITAL_COMMUNITY): Payer: Self-pay

## 2015-05-14 NOTE — Telephone Encounter (Signed)
Call from patient requesting labs and wanting to know what to do about calcium.  Discussed with Dr. Whitney Muse and patient is to continue taking calcium 3 x daily for 2 more weeks then resume previous dosage.  Verbalized understanding of instructions.

## 2015-08-22 ENCOUNTER — Other Ambulatory Visit (HOSPITAL_COMMUNITY): Payer: Self-pay | Admitting: Oncology

## 2015-08-23 ENCOUNTER — Other Ambulatory Visit (HOSPITAL_COMMUNITY): Payer: Self-pay | Admitting: Emergency Medicine

## 2015-08-23 MED ORDER — EXEMESTANE 25 MG PO TABS: 25.0000 mg | ORAL_TABLET | Freq: Every day | ORAL | Status: DC

## 2015-09-05 DIAGNOSIS — Z6828 Body mass index (BMI) 28.0-28.9, adult: Secondary | ICD-10-CM | POA: Diagnosis not present

## 2015-09-05 DIAGNOSIS — N390 Urinary tract infection, site not specified: Secondary | ICD-10-CM | POA: Diagnosis not present

## 2015-09-19 DIAGNOSIS — N3001 Acute cystitis with hematuria: Secondary | ICD-10-CM | POA: Diagnosis not present

## 2015-09-20 DIAGNOSIS — N3001 Acute cystitis with hematuria: Secondary | ICD-10-CM | POA: Diagnosis not present

## 2015-10-22 ENCOUNTER — Ambulatory Visit (INDEPENDENT_AMBULATORY_CARE_PROVIDER_SITE_OTHER): Payer: PPO | Admitting: Urology

## 2015-10-22 ENCOUNTER — Other Ambulatory Visit: Payer: Self-pay | Admitting: Urology

## 2015-10-22 DIAGNOSIS — R3129 Other microscopic hematuria: Secondary | ICD-10-CM | POA: Diagnosis not present

## 2015-10-22 DIAGNOSIS — R310 Gross hematuria: Secondary | ICD-10-CM

## 2015-10-29 ENCOUNTER — Ambulatory Visit (HOSPITAL_COMMUNITY)
Admission: RE | Admit: 2015-10-29 | Discharge: 2015-10-29 | Disposition: A | Discharge status: Home / Self Care | Payer: PPO | Source: Ambulatory Visit | Attending: Urology | Admitting: Urology

## 2015-10-29 DIAGNOSIS — R310 Gross hematuria: Secondary | ICD-10-CM

## 2015-10-29 MED ORDER — SODIUM CHLORIDE 0.9 % IV SOLN
INTRAVENOUS | Status: AC
  Filled 2015-10-29: qty 250

## 2015-10-29 MED ORDER — IOPAMIDOL (ISOVUE-300) INJECTION 61%
INTRAVENOUS | Status: AC
  Filled 2015-10-29: qty 150

## 2015-10-30 ENCOUNTER — Encounter (HOSPITAL_COMMUNITY): Payer: Self-pay | Admitting: Oncology

## 2015-10-30 NOTE — Progress Notes (Addendum)
Tamara [name], [name] [address]  Malignant neoplasm of upper-outer quadrant of right female breast [facility]) - Plan: CBC with Differential, Comprehensive metabolic panel, CBC with Differential, Comprehensive metabolic panel  Osteoporosis - Plan: Comprehensive metabolic panel, Comprehensive metabolic panel, Comprehensive metabolic panel  CURRENT THERAPY: Aromasin daily beginning on 04/28/2013 which she will take for 5 years. Also on Prolia every 6 months beginning on 04/26/2013.  INTERVAL HISTORY: Tamara [name] 82 y.o. female returns for followup of Stage IA (T1b, N0, M0) invasive mucinous breast carcinoma, S/P right breast lumpectomy by Dr. Arnoldo Morale on 03/08/2013 with 1 negative sentinel node. Cancer was identified as ER+ 100%, PR+ 85%, Her2 negative, and Ki-67 marker at 17%. Oncotype Dx score of 20 with 13% 10 year risk of distant recurrence and absolute benefit of chemotherapy at 10 years in this risk category is negligible and less than 1%.  AND  Osteoporosis, on Prolia every 6 months.     Malignant neoplasm of upper-outer quadrant of RIGHT female breast (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  Her2 negative, ER 100%, PR 85%, Ki-67 17%.   03/08/2013 Surgery Right lumpectomy with sentinel node biopsy revealing adjacent DCIS (intermediate grade).  Lymph node is negative for disease.  T1b N0.   03/24/2013 Imaging Bone density-  Osteoporosis   04/26/2013 Survivorship Prolia every 6 months   04/28/2013 -  Chemotherapy Aromasin daily.  She will take for 5 years.   04/08/2015 Imaging Bone density- There has been a statistically significant IMPROVEMENT compared to patient's previous exam of 03/24/2013.    I personally reviewed and went over laboratory results with the patient.  The results are noted within this dictation.  Labs will be updated today.Hypocalcemia is noted in the setting of a normal  albumin level. He is due for Prolia today. She admits to compliance with her calcium and vitamin D intake. I will hold Prolia today.  I provided her information regarding BCI testing and the opportunity to provide Tamara [name] information regarding the utility of extended endocrine therapy.  She is agreeable to this testing and I will fill out the paperwork and submitted accordingly.  I personally reviewed and went over radiographic studies with the patient.  The results are noted within this dictation.  Tamara [name] of right axilla on 05/07/2015 Was a negative exam and BI-RADS Category 1. She is due for her next mammogram in June 2017.  Oncologically, she denies any complaints and ROS questioning is negative.    Past Medical History  Diagnosis Date  . Hypertension   . Cancer ([place])   . Claustrophobia   . Claustrophobia   . Breast cancer ([place])   . Osteoporosis   . Breast cancer, right breast ([place]) 04/25/2013  . Malignant neoplasm of upper-outer quadrant of RIGHT female breast ([place]) 04/25/2013    Stage IA (T1b, N0, M0) invasive mucinous breast carcinoma, S/P right breast lumpectomy by Dr. Arnoldo Morale on 03/08/2013 with 1 negative sentinel node. Cancer was identified as ER+ 100%, PR+ 85%, Her2 negative, and Ki-67 marker at 17%. Oncotype Dx score of 20 with 13% 10 year risk of distant recurrence and absolute benefit of chemotherapy at 10 years in this risk category is negligible and less than 1%.    has Osteoporosis; COUGH, CHRONIC; and Malignant neoplasm of upper-outer quadrant of RIGHT female breast (Tamara [name]) on her problem list.     has No Known Allergies.  Ms. Ethier does  not currently have medications on file.  Past Surgical History  Procedure Laterality Date  . Abdominal hysterectomy    . Cataract extraction, bilateral    . Partial mastectomy with needle localization and axillary sentinel lymph node bx Right 03/08/2013    Procedure: PARTIAL MASTECTOMY WITH NEEDLE LOCALIZATION AND AXILLARY SENTINEL LYMPH NODE BX;   Surgeon: Jamesetta So, MD;  Location: [facility];  Service: General;  Laterality: Right;  Sentinel Node Bx @ 7:30am Needle Loc @ 8:00am    Denies any headaches, dizziness, double vision, fevers, chills, night sweats, nausea, vomiting, diarrhea, constipation, chest pain, heart palpitations, shortness of breath, blood in stool, black tarry stool, urinary pain, urinary burning, urinary frequency, hematuria.   PHYSICAL EXAMINATION  ECOG PERFORMANCE STATUS: 0 - Asymptomatic  Filed Vitals:   10/31/15 0912  BP: 129/63  Pulse: 82  Temp: 98.1 F (36.7 C)  Resp: 18    GENERAL:alert, no distress, well nourished, well developed, comfortable, cooperative and smiling, Unaccompanied. SKIN: skin color, texture, turgor are normal, no rashes or significant lesions HEAD: Normocephalic, No masses, lesions, tenderness or abnormalities EYES: normal, PERRLA, EOMI, Conjunctiva are pink and non-injected EARS: External ears normal OROPHARYNX:lips, buccal mucosa, and tongue normal and mucous membranes are moist  NECK: supple, no adenopathy, thyroid normal size, non-tender, without nodularity, no stridor, non-tender, trachea midline LYMPH:  no palpable lymphadenopathy, no hepatosplenomegaly BREAST:breasts appear normal, no suspicious masses, no skin or nipple changes or axillary nodes LUNGS: clear to auscultation and percussion without any wheezes, rales, or rhonchi. HEART: regular rate & rhythm, no murmurs, no gallops, S1 normal and S2 normal ABDOMEN:abdomen soft, non-tender, obese, normal bowel sounds, no masses or organomegaly and no hepatosplenomegaly BACK: Back symmetric, no curvature., No CVA tenderness EXTREMITIES:less then 2 second capillary refill, no joint deformities, effusion, or inflammation, no edema, no skin discoloration, no clubbing, no cyanosis  NEURO: alert & oriented x 3 with fluent speech, no focal motor/sensory deficits, gait normal   LABORATORY DATA: CBC    Component Value Date/Time    WBC 7.3 10/31/2015 0935   RBC 3.94 10/31/2015 0935   HGB 11.2* 10/31/2015 0935   HCT 34.5* 10/31/2015 0935   PLT 237 10/31/2015 0935   MCV 87.6 10/31/2015 0935   MCH 28.4 10/31/2015 0935   MCHC 32.5 10/31/2015 0935   RDW 13.9 10/31/2015 0935   LYMPHSABS 1.4 10/31/2015 0935   MONOABS 0.6 10/31/2015 0935   EOSABS 0.1 10/31/2015 0935   BASOSABS 0.0 10/31/2015 0935      Chemistry      Component Value Date/Time   NA 142 10/31/2015 0935   K 3.8 10/31/2015 0935   CL 105 10/31/2015 0935   CO2 28 10/31/2015 0935   BUN 17 10/31/2015 0935   CREATININE 0.92 10/31/2015 0935      Component Value Date/Time   CALCIUM 8.6* 10/31/2015 0935   ALKPHOS 69 10/31/2015 0935   AST 19 10/31/2015 0935   ALT 14 10/31/2015 0935   BILITOT 0.8 10/31/2015 0935        ASSESSMENT AND PLAN:  Malignant neoplasm of upper-outer quadrant of RIGHT female breast (HCC) Stage IA (T1b, N0, M0) invasive mucinous breast carcinoma, S/P right breast lumpectomy by Dr. Arnoldo Morale on 03/08/2013 with 1 negative sentinel node. Cancer was identified as ER+ 100%, PR+ 85%, Her2 negative, and Ki-67 marker at 17%. Oncotype Dx score of 20 with 13% 10 year risk of distant recurrence and absolute benefit of chemotherapy at 10 years in this risk category is negligible and  less than 1%.  I spent time with the patient discussing Breast Cancer Index testing to help identify the benefit to extended endocrine therapy 5 versus 10 years.  She is interested in this and I will complete the paperwork for her and submit this accordingly.  Next mammogram is due in June 2017.  Continue Aromasin daily, which started on 04/28/2013.  Labs today as ordered: CBC diff, CMET.    Labs in 6 months: CBC diff, CMET.  Return in 6 months for follow-up and repeat labs.   Osteoporosis Osteoporosis at baseline prior to aromatase inhibitor initiation. Started on Prolia in October 2014 and repeat bone density exam in September 2016 demonstrates significant  improvement in bone density.   Oncology Flowsheet 05/01/2015  denosumab (PROLIA) [place] 60 mg    Prolia due today based upon laboratory results (calcium level). Hypocalcemia today is appreciated. She admits to compliance with her calcium and vitamin D daily. Given her hypocalcemia today, I have asked her to increase her intake of calcium. She return in 2-3 weeks for repeat laboratory check and of calcium is within normal limits, she will be administered Prolia.  Next bone density is due in September 2018.       THERAPY PLAN:  Continue Aromasin daily and compliance reinforced today.  Continue Prolia for osteoporosis.  All questions were answered. The patient knows to call the clinic with any problems, questions or concerns. We can certainly see the patient much sooner if necessary.  Patient and plan discussed with Dr. Ancil Linsey and she is in agreement with the aforementioned.   This note is electronically signed by: Robynn Pane 10/31/2015 4:02 PM

## 2015-10-30 NOTE — Assessment & Plan Note (Addendum)
Stage IA (T1b, N0, M0) invasive mucinous breast carcinoma, S/P right breast lumpectomy by Dr. Jenkins on 03/08/2013 with 1 negative sentinel node. Cancer was identified as ER+ 100%, PR+ 85%, Her2 negative, and Ki-67 marker at 17%. Oncotype Dx score of 20 with 13% 10 year risk of distant recurrence and absolute benefit of chemotherapy at 10 years in this risk category is negligible and less than 1%.  I spent time with the patient discussing Breast Cancer Index testing to help identify the benefit to extended endocrine therapy 5 versus 10 years.  She is interested in this and I will complete the paperwork for her and submit this accordingly.  Next mammogram is due in June 2017.  Continue Aromasin daily, which started on 04/28/2013.  Labs today as ordered: CBC diff, CMET.    Labs in 6 months: CBC diff, CMET.  Return in 6 months for follow-up and repeat labs.  

## 2015-10-30 NOTE — Assessment & Plan Note (Addendum)
Osteoporosis at baseline prior to aromatase inhibitor initiation. Started on Prolia in October 2014 and repeat bone density exam in September 2016 demonstrates significant improvement in bone density.   Oncology Flowsheet 05/01/2015  denosumab (PROLIA) [place] 60 mg    Prolia due today based upon laboratory results (calcium level). Hypocalcemia today is appreciated. She admits to compliance with her calcium and vitamin D daily. Given her hypocalcemia today, I have asked her to increase her intake of calcium. She return in 2-3 weeks for repeat laboratory check and of calcium is within normal limits, she will be administered Prolia.  Next bone density is due in September 2018.

## 2015-10-31 ENCOUNTER — Encounter (HOSPITAL_BASED_OUTPATIENT_CLINIC_OR_DEPARTMENT_OTHER): Payer: PPO | Admitting: Oncology

## 2015-10-31 ENCOUNTER — Ambulatory Visit (HOSPITAL_COMMUNITY): Payer: Commercial Managed Care - HMO | Admitting: Oncology

## 2015-10-31 ENCOUNTER — Encounter (HOSPITAL_COMMUNITY): Payer: Self-pay | Admitting: Oncology

## 2015-10-31 ENCOUNTER — Other Ambulatory Visit (HOSPITAL_COMMUNITY): Payer: Commercial Managed Care - HMO

## 2015-10-31 ENCOUNTER — Encounter (HOSPITAL_COMMUNITY): Payer: PPO

## 2015-10-31 ENCOUNTER — Encounter (HOSPITAL_COMMUNITY): Payer: PPO | Attending: Oncology

## 2015-10-31 ENCOUNTER — Ambulatory Visit (HOSPITAL_COMMUNITY): Payer: Commercial Managed Care - HMO | Admitting: Hematology & Oncology

## 2015-10-31 VITALS — BP 129/63 | HR 82 | Temp 98.1°F | Resp 18 | Wt 153.9 lb

## 2015-10-31 DIAGNOSIS — C50411 Malignant neoplasm of upper-outer quadrant of right female breast: Secondary | ICD-10-CM

## 2015-10-31 DIAGNOSIS — Z90710 Acquired absence of both cervix and uterus: Secondary | ICD-10-CM | POA: Insufficient documentation

## 2015-10-31 DIAGNOSIS — M810 Age-related osteoporosis without current pathological fracture: Secondary | ICD-10-CM | POA: Diagnosis not present

## 2015-10-31 DIAGNOSIS — Z79899 Other long term (current) drug therapy: Secondary | ICD-10-CM | POA: Diagnosis not present

## 2015-10-31 DIAGNOSIS — Z170 Estrogen receptor positive status [ER+]: Secondary | ICD-10-CM | POA: Diagnosis not present

## 2015-10-31 DIAGNOSIS — Z9011 Acquired absence of right breast and nipple: Secondary | ICD-10-CM | POA: Insufficient documentation

## 2015-10-31 DIAGNOSIS — Z9889 Other specified postprocedural states: Secondary | ICD-10-CM | POA: Insufficient documentation

## 2015-10-31 LAB — COMPREHENSIVE METABOLIC PANEL
ALBUMIN: 4.1 g/dL (ref 3.5–5.0)
ALT: 14 U/L (ref 14–54)
ANION GAP: 9 (ref 5–15)
AST: 19 U/L (ref 15–41)
Alkaline Phosphatase: 69 U/L (ref 38–126)
BUN: 17 mg/dL (ref 6–20)
CHLORIDE: 105 mmol/L (ref 101–111)
CO2: 28 mmol/L (ref 22–32)
Calcium: 8.6 mg/dL — ABNORMAL LOW (ref 8.9–10.3)
Creatinine, Ser: 0.92 mg/dL (ref 0.44–1.00)
GFR calc Af Amer: >60 mL/min (ref >60)
GFR calc non Af Amer: 58 mL/min — ABNORMAL LOW (ref >60)
GLUCOSE: 120 mg/dL — AB (ref 65–99)
POTASSIUM: 3.8 mmol/L (ref 3.5–5.1)
SODIUM: 142 mmol/L (ref 135–145)
TOTAL PROTEIN: 7.3 g/dL (ref 6.5–8.1)
Total Bilirubin: 0.8 mg/dL (ref 0.3–1.2)

## 2015-10-31 LAB — CBC WITH DIFFERENTIAL/PLATELET
BASOS PCT: 0 %
Basophils Absolute: 0 10*3/uL (ref 0.0–0.1)
EOS ABS: 0.1 10*3/uL (ref 0.0–0.7)
EOS PCT: 1 %
HEMATOCRIT: 34.5 % — AB (ref 36.0–46.0)
Hemoglobin: 11.2 g/dL — ABNORMAL LOW (ref 12.0–15.0)
Lymphocytes Relative: 19 %
Lymphs Abs: 1.4 10*3/uL (ref 0.7–4.0)
MCH: 28.4 pg (ref 26.0–34.0)
MCHC: 32.5 g/dL (ref 30.0–36.0)
MCV: 87.6 fL (ref 78.0–100.0)
MONO ABS: 0.6 10*3/uL (ref 0.1–1.0)
MONOS PCT: 8 %
Neutro Abs: 5.2 10*3/uL (ref 1.7–7.7)
Neutrophils Relative %: 72 %
PLATELETS: 237 10*3/uL (ref 150–400)
RBC: 3.94 MIL/uL (ref 3.87–5.11)
RDW: 13.9 % (ref 11.5–15.5)
WBC: 7.3 10*3/uL (ref 4.0–10.5)

## 2015-10-31 NOTE — Progress Notes (Signed)
Prolia not given today,treatment parameters not met.

## 2015-10-31 NOTE — Patient Instructions (Signed)
Tamara [name] at [facility] Discharge Instructions  RECOMMENDATIONS MADE BY THE CONSULTANT AND ANY TEST RESULTS WILL BE SENT TO YOUR REFERRING PHYSICIAN.  Exam done and seen by Kirby Crigler, PA today. Hold Prolia today. Double your Calcium + Vitamin D medications x 2-3 weeks. Prolia in 2-3 weeks if calcium levels are up at that time. Mammogram in June 2017. Labs and Return to see MD in 44months. Lab appointment in 2-3 weeks for Calcium level only.   Thank you for choosing [facility] at [facility] to provide your oncology and hematology care.  To afford each patient quality time with our provider, please arrive at least 15 minutes before your scheduled appointment time.   Beginning January 23rd 2017 lab work for the [facility] will be done in the  Main lab at [facility] on 1st floor. If you have a lab appointment with the [facility] please come in thru the  Main Entrance and check in at the main information desk  You need to re-schedule your appointment should you arrive 10 or more minutes late.  We strive to give you quality time with our providers, and arriving late affects you and other patients whose appointments are after yours.  Also, if you no show three or more times for appointments you may be dismissed from the clinic at the providers discretion.     Again, thank you for choosing [facility].  Our hope is that these requests will decrease the amount of time that you wait before being seen by our physicians.       _____________________________________________________________  Should you have questions after your visit to [facility], please contact our office at [phone] between the hours of 8:30 a.m. and 4:30 p.m.  Voicemails left after 4:30 p.m. will not be returned until the following business day.  For prescription refill requests, have your pharmacy contact our office.         Resources For  Cancer Patients and their Caregivers ? American Cancer Society: Can assist with transportation, wigs, general needs, runs Look Good Feel Better.        ([phone] [phone] ? Cancer Care: Provides financial assistance, online support groups, medication/co-pay assistance.  1-800-813-HOPE 6504023605) ? [place] Assists [place] cancer patients and their families through emotional , educational and financial support.  912-617-0182 ? [place] DSS Where to apply for food stamps, Medicaid and utility assistance. 813 649 7683 ? [facility]: Transportation to medical appointments. (623)239-3419 ? Social Security Administration: May apply for disability if have a Stage IV cancer. (737)613-2333 (984)309-3926 ? [employer], Disability and Transit Services: Assists with nutrition, care and transit needs. 616-167-8416

## 2015-11-04 ENCOUNTER — Other Ambulatory Visit (HOSPITAL_COMMUNITY): Payer: Self-pay | Admitting: Oncology

## 2015-11-04 DIAGNOSIS — C50411 Malignant neoplasm of upper-outer quadrant of right female breast: Secondary | ICD-10-CM

## 2015-11-05 ENCOUNTER — Ambulatory Visit (HOSPITAL_COMMUNITY)
Admission: RE | Admit: 2015-11-05 | Discharge: 2015-11-05 | Disposition: A | Discharge status: Home / Self Care | Payer: PPO | Source: Ambulatory Visit | Attending: Urology | Admitting: Urology

## 2015-11-05 DIAGNOSIS — K8020 Calculus of gallbladder without cholecystitis without obstruction: Secondary | ICD-10-CM | POA: Diagnosis not present

## 2015-11-05 DIAGNOSIS — N323 Diverticulum of bladder: Secondary | ICD-10-CM | POA: Insufficient documentation

## 2015-11-05 DIAGNOSIS — R310 Gross hematuria: Secondary | ICD-10-CM | POA: Insufficient documentation

## 2015-11-05 MED ORDER — IOPAMIDOL (ISOVUE-300) INJECTION 61%
150.0000 mL | Freq: Once | INTRAVENOUS | Status: AC | PRN
  Administered 2015-11-05: 125 mL via INTRAVENOUS

## 2015-11-05 MED ORDER — SODIUM CHLORIDE 0.9 % IV SOLN
INTRAVENOUS | Status: AC
  Filled 2015-11-05: qty 250

## 2015-11-11 DIAGNOSIS — C50411 Malignant neoplasm of upper-outer quadrant of right female breast: Secondary | ICD-10-CM | POA: Diagnosis not present

## 2015-11-14 ENCOUNTER — Encounter (HOSPITAL_BASED_OUTPATIENT_CLINIC_OR_DEPARTMENT_OTHER): Payer: PPO

## 2015-11-14 ENCOUNTER — Encounter (HOSPITAL_COMMUNITY): Payer: PPO

## 2015-11-14 VITALS — BP 123/66 | HR 78 | Temp 98.7°F | Resp 16 | Ht 60.0 in | Wt 152.2 lb

## 2015-11-14 DIAGNOSIS — C50411 Malignant neoplasm of upper-outer quadrant of right female breast: Secondary | ICD-10-CM

## 2015-11-14 DIAGNOSIS — M810 Age-related osteoporosis without current pathological fracture: Secondary | ICD-10-CM

## 2015-11-14 LAB — COMPREHENSIVE METABOLIC PANEL
ALK PHOS: 70 U/L (ref 38–126)
ALT: 10 U/L — AB (ref 14–54)
ANION GAP: 11 (ref 5–15)
AST: 17 U/L (ref 15–41)
Albumin: 4 g/dL (ref 3.5–5.0)
BUN: 17 mg/dL (ref 6–20)
CALCIUM: 9.1 mg/dL (ref 8.9–10.3)
CHLORIDE: 103 mmol/L (ref 101–111)
CO2: 26 mmol/L (ref 22–32)
CREATININE: 1.04 mg/dL — AB (ref 0.44–1.00)
GFR, EST AFRICAN AMERICAN: 58 mL/min — AB (ref >60)
GFR, EST NON AFRICAN AMERICAN: 50 mL/min — AB (ref >60)
Glucose, Bld: 165 mg/dL — ABNORMAL HIGH (ref 65–99)
Potassium: 4 mmol/L (ref 3.5–5.1)
SODIUM: 140 mmol/L (ref 135–145)
Total Bilirubin: 0.8 mg/dL (ref 0.3–1.2)
Total Protein: 6.9 g/dL (ref 6.5–8.1)

## 2015-11-14 MED ORDER — DENOSUMAB 60 MG/ML ~~LOC~~ SOLN
60.0000 mg | Freq: Once | SUBCUTANEOUS | Status: AC
  Administered 2015-11-14: 60 mg via SUBCUTANEOUS
  Filled 2015-11-14: qty 1

## 2015-11-14 NOTE — Progress Notes (Unsigned)
Tamara [name] presents today for injection per MD orders.  Prolia 60mg  administered SQ in right Upper Arm. Administration without incident. Patient tolerated well.

## 2015-11-15 ENCOUNTER — Encounter (HOSPITAL_COMMUNITY): Payer: Self-pay

## 2015-11-26 ENCOUNTER — Ambulatory Visit (INDEPENDENT_AMBULATORY_CARE_PROVIDER_SITE_OTHER): Payer: PPO | Admitting: Urology

## 2015-11-26 DIAGNOSIS — R310 Gross hematuria: Secondary | ICD-10-CM | POA: Diagnosis not present

## 2015-11-26 DIAGNOSIS — N390 Urinary tract infection, site not specified: Secondary | ICD-10-CM | POA: Diagnosis not present

## 2015-11-26 DIAGNOSIS — N281 Cyst of kidney, acquired: Secondary | ICD-10-CM

## 2015-12-23 DIAGNOSIS — Z79899 Other long term (current) drug therapy: Secondary | ICD-10-CM | POA: Diagnosis not present

## 2015-12-23 DIAGNOSIS — C50911 Malignant neoplasm of unspecified site of right female breast: Secondary | ICD-10-CM | POA: Diagnosis not present

## 2015-12-23 DIAGNOSIS — E119 Type 2 diabetes mellitus without complications: Secondary | ICD-10-CM | POA: Diagnosis not present

## 2015-12-23 DIAGNOSIS — I350 Nonrheumatic aortic (valve) stenosis: Secondary | ICD-10-CM | POA: Diagnosis not present

## 2015-12-23 DIAGNOSIS — J8410 Pulmonary fibrosis, unspecified: Secondary | ICD-10-CM | POA: Diagnosis not present

## 2015-12-23 DIAGNOSIS — M810 Age-related osteoporosis without current pathological fracture: Secondary | ICD-10-CM | POA: Diagnosis not present

## 2015-12-23 DIAGNOSIS — I10 Essential (primary) hypertension: Secondary | ICD-10-CM | POA: Diagnosis not present

## 2015-12-30 DIAGNOSIS — I10 Essential (primary) hypertension: Secondary | ICD-10-CM | POA: Diagnosis not present

## 2015-12-30 DIAGNOSIS — Z853 Personal history of malignant neoplasm of breast: Secondary | ICD-10-CM | POA: Diagnosis not present

## 2015-12-30 DIAGNOSIS — E1129 Type 2 diabetes mellitus with other diabetic kidney complication: Secondary | ICD-10-CM | POA: Diagnosis not present

## 2015-12-30 DIAGNOSIS — J84112 Idiopathic pulmonary fibrosis: Secondary | ICD-10-CM | POA: Diagnosis not present

## 2016-01-14 ENCOUNTER — Encounter (HOSPITAL_COMMUNITY): Payer: PPO

## 2016-01-23 DIAGNOSIS — Z800 Family history of malignant neoplasm of digestive organs: Secondary | ICD-10-CM | POA: Diagnosis not present

## 2016-01-28 ENCOUNTER — Ambulatory Visit (HOSPITAL_COMMUNITY)
Admission: RE | Admit: 2016-01-28 | Discharge: 2016-01-28 | Disposition: A | Discharge status: Home / Self Care | Payer: PPO | Source: Ambulatory Visit | Attending: Oncology | Admitting: Oncology

## 2016-01-28 DIAGNOSIS — C50411 Malignant neoplasm of upper-outer quadrant of right female breast: Secondary | ICD-10-CM | POA: Diagnosis not present

## 2016-01-28 DIAGNOSIS — R928 Other abnormal and inconclusive findings on diagnostic imaging of breast: Secondary | ICD-10-CM | POA: Diagnosis not present

## 2016-02-17 ENCOUNTER — Other Ambulatory Visit (HOSPITAL_COMMUNITY): Payer: Self-pay | Admitting: Hematology & Oncology

## 2016-03-20 ENCOUNTER — Other Ambulatory Visit (HOSPITAL_COMMUNITY): Payer: Self-pay | Admitting: Hematology & Oncology

## 2016-03-25 ENCOUNTER — Other Ambulatory Visit (HOSPITAL_COMMUNITY): Payer: Self-pay | Admitting: Hematology & Oncology

## 2016-05-01 ENCOUNTER — Ambulatory Visit (HOSPITAL_COMMUNITY): Payer: PPO | Admitting: Oncology

## 2016-05-01 ENCOUNTER — Other Ambulatory Visit (HOSPITAL_COMMUNITY): Payer: PPO

## 2016-05-01 ENCOUNTER — Ambulatory Visit (HOSPITAL_COMMUNITY): Payer: PPO

## 2016-05-18 ENCOUNTER — Encounter (HOSPITAL_COMMUNITY): Payer: PPO

## 2016-05-18 ENCOUNTER — Encounter (HOSPITAL_COMMUNITY): Payer: Self-pay | Admitting: Oncology

## 2016-05-18 ENCOUNTER — Encounter (HOSPITAL_COMMUNITY): Payer: PPO | Attending: Oncology | Admitting: Oncology

## 2016-05-18 VITALS — BP 138/66 | HR 77 | Temp 98.2°F | Resp 16 | Wt 152.0 lb

## 2016-05-18 DIAGNOSIS — C50411 Malignant neoplasm of upper-outer quadrant of right female breast: Secondary | ICD-10-CM | POA: Diagnosis not present

## 2016-05-18 DIAGNOSIS — D649 Anemia, unspecified: Secondary | ICD-10-CM

## 2016-05-18 DIAGNOSIS — M810 Age-related osteoporosis without current pathological fracture: Secondary | ICD-10-CM | POA: Insufficient documentation

## 2016-05-18 DIAGNOSIS — Z170 Estrogen receptor positive status [ER+]: Secondary | ICD-10-CM | POA: Diagnosis not present

## 2016-05-18 LAB — CBC WITH DIFFERENTIAL/PLATELET
BASOS ABS: 0 10*3/uL (ref 0.0–0.1)
Basophils Relative: 0 %
EOS ABS: 0.1 10*3/uL (ref 0.0–0.7)
EOS PCT: 1 %
HCT: 35.8 % — ABNORMAL LOW (ref 36.0–46.0)
Hemoglobin: 11.5 g/dL — ABNORMAL LOW (ref 12.0–15.0)
LYMPHS PCT: 15 %
Lymphs Abs: 1.1 10*3/uL (ref 0.7–4.0)
MCH: 28.3 pg (ref 26.0–34.0)
MCHC: 32.1 g/dL (ref 30.0–36.0)
MCV: 88.2 fL (ref 78.0–100.0)
MONO ABS: 0.9 10*3/uL (ref 0.1–1.0)
Monocytes Relative: 11 %
Neutro Abs: 5.5 10*3/uL (ref 1.7–7.7)
Neutrophils Relative %: 73 %
PLATELETS: 218 10*3/uL (ref 150–400)
RBC: 4.06 MIL/uL (ref 3.87–5.11)
RDW: 15.4 % (ref 11.5–15.5)
WBC: 7.6 10*3/uL (ref 4.0–10.5)

## 2016-05-18 LAB — COMPREHENSIVE METABOLIC PANEL
ALT: 15 U/L (ref 14–54)
AST: 20 U/L (ref 15–41)
Albumin: 4.1 g/dL (ref 3.5–5.0)
Alkaline Phosphatase: 63 U/L (ref 38–126)
Anion gap: 6 (ref 5–15)
BUN: 19 mg/dL (ref 6–20)
CHLORIDE: 104 mmol/L (ref 101–111)
CO2: 28 mmol/L (ref 22–32)
Calcium: 8.7 mg/dL — ABNORMAL LOW (ref 8.9–10.3)
Creatinine, Ser: 0.92 mg/dL (ref 0.44–1.00)
GFR, EST NON AFRICAN AMERICAN: 58 mL/min — AB (ref >60)
Glucose, Bld: 180 mg/dL — ABNORMAL HIGH (ref 65–99)
POTASSIUM: 3.9 mmol/L (ref 3.5–5.1)
SODIUM: 138 mmol/L (ref 135–145)
Total Bilirubin: 0.9 mg/dL (ref 0.3–1.2)
Total Protein: 7.4 g/dL (ref 6.5–8.1)

## 2016-05-18 NOTE — Progress Notes (Signed)
Tamara [name], [address] [place] [address]  Malignant neoplasm of upper-outer quadrant of right breast in female, estrogen receptor positive ([place]) - Plan: CBC with Differential, Comprehensive metabolic panel  Age-related osteoporosis without current pathological fracture - Plan: Comprehensive metabolic panel, Comprehensive metabolic panel  Anemia, unspecified type - Plan: CBC, Vitamin B12, Folate, Iron and TIBC, Ferritin  CURRENT THERAPY: Aromasin beginning on 04/28/2013.   INTERVAL HISTORY: Tamara [name] 82 y.o. female returns for followup of Stage IA (T1b, N0, M0) invasive mucinous breast carcinoma, S/P right breast lumpectomy by Dr. Arnoldo Morale on 03/08/2013 with 1 negative sentinel node. Cancer was identified as ER+ 100%, PR+ 85%, Her2 negative, and Ki-67 marker at 17%. Oncotype Dx score of 20 with 13% 10 year risk of distant recurrence and absolute benefit of chemotherapy at 10 years in this risk category is negligible and less than 1%.  Aromasin began on 04/28/2013.  BCI testing in April 2017 demonstrated low risk of recurrence and low benefit from extended endocrine therapy. AND Osteoporosis at baseline prior to aromatase inhibitor initiation. Started on Prolia in October 2014 and repeat bone density exam in September 2016 demonstrates significant improvement in bone density.     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  Her2 negative, ER 100%, PR 85%, Ki-67 17%.      03/08/2013 Surgery    Right lumpectomy with sentinel node biopsy revealing adjacent DCIS (intermediate grade).  Lymph node is negative for disease.  T1b N0.      03/24/2013 Imaging    Bone density-  Osteoporosis      04/26/2013 Survivorship    Prolia every 6 months      04/28/2013 -  Chemotherapy    Aromasin daily.  She will take for 5 years.      04/08/2015  Imaging    Bone density- There has been a statistically significant IMPROVEMENT compared to patient's previous exam of 03/24/2013.      11/14/2015 Pathology Results    BCI- low risk of late recurrence (year 5-10) at 3.7%, low likelihood of benefit from extended endocrine therapy, low risk of overall recurrence (years 0-10) 6.6%.       I have reviewed her BCI test results.  She has low risk of recurrence and low likelihood of benefit from extended endocrine therapy.  She denies any complaints.  She denies any breast changes or palpable breast abnormalities.  She does perform self breast exams.   Her Ca++ is low.  She admits to noncompliance with her Ca++ without any reason.  She notes "I must have just forgot."   Review of Systems  Constitutional: Negative.  Negative for chills, fever, malaise/fatigue and weight loss.  HENT: Negative.   Eyes: Negative.  Negative for blurred vision and double vision.  Respiratory: Negative.  Negative for cough, hemoptysis, sputum production and shortness of breath.   Cardiovascular: Negative.  Negative for chest pain.  Gastrointestinal: Negative.  Negative for blood in stool, constipation, diarrhea, melena, nausea and vomiting.  Genitourinary: Negative.   Musculoskeletal: Negative.   Skin: Negative.  Negative for itching and rash.  Neurological: Negative.  Negative for weakness and headaches.  Endo/Heme/Allergies: Negative.   Psychiatric/Behavioral: Negative.     Past Medical History:  Diagnosis Date  . Breast cancer ([place])   . Breast cancer, right breast ([place]) 04/25/2013  . Cancer ([place])   .  Claustrophobia   . Claustrophobia   . Hypertension   . Malignant neoplasm of upper-outer quadrant of RIGHT female breast ([place]) 04/25/2013   Stage IA (T1b, N0, M0) invasive mucinous breast carcinoma, S/P right breast lumpectomy by Dr. Arnoldo Morale on 03/08/2013 with 1 negative sentinel node. Cancer was identified as ER+ 100%, PR+ 85%, Her2 negative, and Ki-67 marker at  17%. Oncotype Dx score of 20 with 13% 10 year risk of distant recurrence and absolute benefit of chemotherapy at 10 years in this risk category is negligible and less than 1%.  . Osteoporosis     Past Surgical History:  Procedure Laterality Date  . ABDOMINAL HYSTERECTOMY    . CATARACT EXTRACTION, BILATERAL    . PARTIAL MASTECTOMY WITH NEEDLE LOCALIZATION AND AXILLARY SENTINEL LYMPH NODE BX Right 03/08/2013   Procedure: PARTIAL MASTECTOMY WITH NEEDLE LOCALIZATION AND AXILLARY SENTINEL LYMPH NODE BX;  Surgeon: Jamesetta So, MD;  Location: [facility];  Service: General;  Laterality: Right;  Sentinel Node Bx @ 7:30am Needle Loc @ 8:00am    Family History  Problem Relation Age of Onset  . Diabetes Mother   . Cancer Father   . Cancer Sister   . Cancer Brother     Social History   Social History  . Marital status: Widowed    Spouse name: N/A  . Number of children: N/A  . Years of education: N/A   Social History Main Topics  . Smoking status: Former Smoker    Packs/day: 0.50    Years: 40.00    Types: Cigarettes    Quit date: 03/03/1967  . Smokeless tobacco: Never Used  . Alcohol use No  . Drug use: No  . Sexual activity: Yes    Birth control/ protection: Surgical   Other Topics Concern  . None   Social History Narrative  . None     PHYSICAL EXAMINATION  ECOG PERFORMANCE STATUS: 1 - Symptomatic but completely ambulatory  Vitals:   05/18/16 1000  BP: 138/66  Pulse: 77  Resp: 16  Temp: 98.2 F (36.8 C)    GENERAL:alert, no distress, well nourished, well developed, comfortable, cooperative, smiling and unaccompanied SKIN: skin color, texture, turgor are normal, no rashes or significant lesions HEAD: Normocephalic, No masses, lesions, tenderness or abnormalities EYES: normal, EOMI, Conjunctiva are pink and non-injected EARS: External ears normal OROPHARYNX:lips, buccal mucosa, and tongue normal and mucous membranes are moist  NECK: supple, no adenopathy, trachea  midline LYMPH:  no palpable lymphadenopathy BREAST:not examined LUNGS: clear to auscultation and percussion HEART: regular rate & rhythm, no murmurs, no gallops, S1 normal and S2 normal ABDOMEN:abdomen soft, non-tender and normal bowel sounds BACK: Back symmetric, no curvature. EXTREMITIES:less then 2 second capillary refill, no joint deformities, effusion, or inflammation, no skin discoloration, no cyanosis  NEURO: alert & oriented x 3 with fluent speech, no focal motor/sensory deficits, gait normal   LABORATORY DATA: CBC    Component Value Date/Time   WBC 7.6 05/18/2016 0943   RBC 4.06 05/18/2016 0943   HGB 11.5 (L) 05/18/2016 0943   HCT 35.8 (L) 05/18/2016 0943   PLT 218 05/18/2016 0943   MCV 88.2 05/18/2016 0943   MCH 28.3 05/18/2016 0943   MCHC 32.1 05/18/2016 0943   RDW 15.4 05/18/2016 0943   LYMPHSABS 1.1 05/18/2016 0943   MONOABS 0.9 05/18/2016 0943   EOSABS 0.1 05/18/2016 0943   BASOSABS 0.0 05/18/2016 0943      Chemistry      Component Value Date/Time   NA  138 05/18/2016 0943   K 3.9 05/18/2016 0943   CL 104 05/18/2016 0943   CO2 28 05/18/2016 0943   BUN 19 05/18/2016 0943   CREATININE 0.92 05/18/2016 0943      Component Value Date/Time   CALCIUM 8.7 (L) 05/18/2016 0943   ALKPHOS 63 05/18/2016 0943   AST 20 05/18/2016 0943   ALT 15 05/18/2016 0943   BILITOT 0.9 05/18/2016 0943        PENDING LABS:   RADIOGRAPHIC STUDIES:  No results found.   PATHOLOGY:    ASSESSMENT AND PLAN:  Malignant neoplasm of upper-outer quadrant of RIGHT female breast (HCC) Stage IA (T1b, N0, M0) invasive mucinous breast carcinoma, S/P right breast lumpectomy by Dr. Arnoldo Morale on 03/08/2013 with 1 negative sentinel node. Cancer was identified as ER+ 100%, PR+ 85%, Her2 negative, and Ki-67 marker at 17%. Oncotype Dx score of 20 with 13% 10 year risk of distant recurrence and absolute benefit of chemotherapy at 10 years in this risk category is negligible and less than 1%.   Aromasin began on 04/28/2013.  BCI testing in April 2017 demonstrated low risk of recurrence and low benefit from extended endocrine therapy.  Labs today: CBC diff, CMET.  I personally reviewed and went over laboratory results with the patient.  The results are noted within this dictation.  Hypocalcemia is noted with normal albumin.  Minimal anemia, Stable.  Due to hypocalcemia, Prolia will be held today.  She reports that she stopped taking her Ca++ .  She denies any particular reason other than just forgetting.   She will restart her Ca++ and Vit D and we will check her levels in 2-3 weeks.  Labs in 2-3 weeks: CMET.  Given her minimal anemia, I will add a CBC and anemia panel to her labs as well.  Prolia injection in 2-3 weeks.  I personally reviewed and went over radiographic studies with the patient.  The results are noted within this dictation.  Mammogram in July 2017 was BIRADS 2. Next mammogram is due in July 2018.  Continue Aromasin daily, which started on 04/28/2013.  Labs in 6 months: CBC diff, CMET.  Return in 6 months for follow-up, labs, Prolia, and breast exam.  Osteoporosis Osteoporosis at baseline prior to aromatase inhibitor initiation. Started on Prolia in October 2014 and repeat bone density exam in September 2016 demonstrates significant improvement in bone density.   Due for Prolia today but due to hypocalcemia, will hold x 3 weeks with repeat labs.  Patient stopped her Ca++ for unknown reason  Next bone density is due in September 2018.     ORDERS PLACED FOR THIS ENCOUNTER: Orders Placed This Encounter  Procedures  . Comprehensive metabolic panel  . CBC with Differential  . Comprehensive metabolic panel  . CBC  . Vitamin B12  . Folate  . Iron and TIBC  . Ferritin    MEDICATIONS PRESCRIBED THIS ENCOUNTER: No orders of the defined types were placed in this encounter.   THERAPY PLAN:  Continue Aromasin daily and compliance reinforced today.  Continue  Prolia for osteoporosis.  All questions were answered. The patient knows to call the clinic with any problems, questions or concerns. We can certainly see the patient much sooner if necessary.  Patient and plan discussed with Dr. Ancil Linsey and she is in agreement with the aforementioned.   This note is electronically signed by: Doy Mince 05/18/2016 5:39 PM

## 2016-05-18 NOTE — Assessment & Plan Note (Addendum)
Osteoporosis at baseline prior to aromatase inhibitor initiation. Started on Prolia in October 2014 and repeat bone density exam in September 2016 demonstrates significant improvement in bone density.   Due for Prolia today but due to hypocalcemia, will hold x 3 weeks with repeat labs.  Patient stopped her Ca++ for unknown reason  Next bone density is due in September 2018.

## 2016-05-18 NOTE — Patient Instructions (Addendum)
Tamara [name] at [facility] Discharge Instructions  RECOMMENDATIONS MADE BY THE CONSULTANT AND ANY TEST RESULTS WILL BE SENT TO YOUR REFERRING PHYSICIAN.  You were seen today by Kirby Crigler PA-C. Prolia injection NOT given today.  Restart 1000-1200mg  of Calcium a day. Restart 100 units of Vit D a day. Labs and Prolia injection in 3 weeks. Follow up, labs and prolia injection in 6 months.    Thank you for choosing [place] at [facility] to provide your oncology and hematology care.  To afford each patient quality time with our provider, please arrive at least 15 minutes before your scheduled appointment time.   Beginning January 23rd 2017 lab work for the [facility] will be done in the  Main lab at [facility] on 1st floor. If you have a lab appointment with the [place] please come in thru the  Main Entrance and check in at the main information desk  You need to re-schedule your appointment should you arrive 10 or more minutes late.  We strive to give you quality time with our providers, and arriving late affects you and other patients whose appointments are after yours.  Also, if you no show three or more times for appointments you may be dismissed from the clinic at the providers discretion.     Again, thank you for choosing [facility].  Our hope is that these requests will decrease the amount of time that you wait before being seen by our physicians.       _____________________________________________________________  Should you have questions after your visit to [facility], please contact our office at [phone] between the hours of 8:30 a.m. and 4:30 p.m.  Voicemails left after 4:30 p.m. will not be returned until the following business day.  For prescription refill requests, have your pharmacy contact our office.         Resources For Cancer Patients and their Caregivers ? American Cancer  Society: Can assist with transportation, wigs, general needs, runs Look Good Feel Better.        6714464289 ? Cancer Care: Provides financial assistance, online support groups, medication/co-pay assistance.  1-800-813-HOPE 364-391-0604) ? [place] Assists [place] cancer patients and their families through emotional , educational and financial support.  [phone] ? [place] DSS Where to apply for food stamps, Medicaid and utility assistance. 838-761-1846 ? RCATS: Transportation to medical appointments. [phone] ? Social Security Administration: May apply for disability if have a Stage IV cancer. [phone] ? [employer], Disability and Transit Services: Assists with nutrition, care and transit needs. [place] Support Programs: @10RELATIVEDAYS @ > Cancer Support Group  2nd Tuesday of the month 1pm-2pm, Journey Room  > Creative Journey  3rd Tuesday of the month 1130am-1pm, Journey Room  > Look Good Feel Better  1st Wednesday of the month 10am-12 noon, Journey Room (Call [place] to register [phone])

## 2016-05-18 NOTE — Progress Notes (Signed)
Prolia held today per T. Kefalas, PA-C d/t low calcium.

## 2016-05-18 NOTE — Assessment & Plan Note (Addendum)
Stage IA (T1b, N0, M0) invasive mucinous breast carcinoma, S/P right breast lumpectomy by Dr. Arnoldo Morale on 03/08/2013 with 1 negative sentinel node. Cancer was identified as ER+ 100%, PR+ 85%, Her2 negative, and Ki-67 marker at 17%. Oncotype Dx score of 20 with 13% 10 year risk of distant recurrence and absolute benefit of chemotherapy at 10 years in this risk category is negligible and less than 1%.  Aromasin began on 04/28/2013.  BCI testing in April 2017 demonstrated low risk of recurrence and low benefit from extended endocrine therapy.  Labs today: CBC diff, CMET.  I personally reviewed and went over laboratory results with the patient.  The results are noted within this dictation.  Hypocalcemia is noted with normal albumin.  Minimal anemia, Stable.  Due to hypocalcemia, Prolia will be held today.  She reports that she stopped taking her Ca++ .  She denies any particular reason other than just forgetting.   She will restart her Ca++ and Vit D and we will check her levels in 2-3 weeks.  Labs in 2-3 weeks: CMET.  Given her minimal anemia, I will add a CBC and anemia panel to her labs as well.  Prolia injection in 2-3 weeks.  I personally reviewed and went over radiographic studies with the patient.  The results are noted within this dictation.  Mammogram in July 2017 was BIRADS 2. Next mammogram is due in July 2018.  Continue Aromasin daily, which started on 04/28/2013.  Labs in 6 months: CBC diff, CMET.  Return in 6 months for follow-up, labs, Prolia, and breast exam.

## 2016-05-19 ENCOUNTER — Other Ambulatory Visit (HOSPITAL_COMMUNITY): Payer: Self-pay | Admitting: Hematology & Oncology

## 2016-05-20 ENCOUNTER — Other Ambulatory Visit (HOSPITAL_COMMUNITY): Payer: Self-pay | Admitting: Oncology

## 2016-05-20 DIAGNOSIS — C50411 Malignant neoplasm of upper-outer quadrant of right female breast: Secondary | ICD-10-CM

## 2016-05-20 DIAGNOSIS — Z170 Estrogen receptor positive status [ER+]: Secondary | ICD-10-CM

## 2016-05-20 MED ORDER — EXEMESTANE 25 MG PO TABS: ORAL_TABLET | ORAL | 5 refills | Status: DC

## 2016-05-26 ENCOUNTER — Other Ambulatory Visit (HOSPITAL_COMMUNITY)
Admission: RE | Admit: 2016-05-26 | Discharge: 2016-05-26 | Disposition: A | Discharge status: Home / Self Care | Payer: PPO | Source: Other Acute Inpatient Hospital | Attending: Urology | Admitting: Urology

## 2016-05-26 ENCOUNTER — Ambulatory Visit (INDEPENDENT_AMBULATORY_CARE_PROVIDER_SITE_OTHER): Payer: PPO | Admitting: Urology

## 2016-05-26 DIAGNOSIS — R310 Gross hematuria: Secondary | ICD-10-CM | POA: Insufficient documentation

## 2016-05-26 IMAGING — CT CT ABD-PEL WO/W CM
2 of 10 series · 10 of 46 positions shown, 16 images · IV contrast (Omnipaque 300)
Comparison: None.

CLINICAL DATA: Gross hematuria. For 2 days. Breast cancer 3 years
ago lumpectomy. Chemotherapy.

EXAM:
CT ABDOMEN AND PELVIS WITHOUT AND WITH CONTRAST
TECHNIQUE: Multidetector CT imaging of the abdomen and pelvis was performed
following the standard protocol before and following the bolus
administration of intravenous contrast.
CONTRAST:  125mL 6L8HYR-1KK IOPAMIDOL (6L8HYR-1KK) INJECTION 61%

[Series 3: hematuria mpr pre coronal 3.0 · coronal · non-contrast · 0.76mm/px · 2 of 87 slices shown, 3 images]
[im 29/87  soft-tissue]
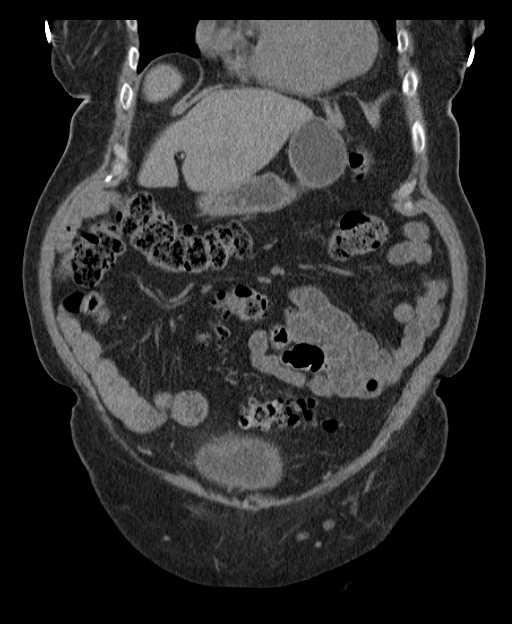
[im 29/87  bone]
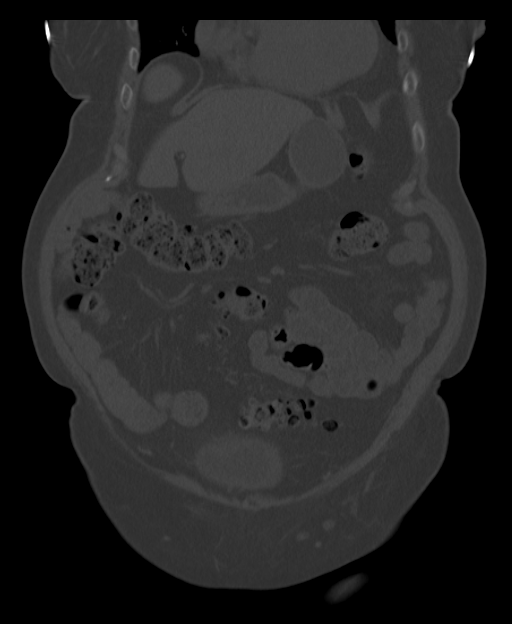
[im 58/87  soft-tissue]
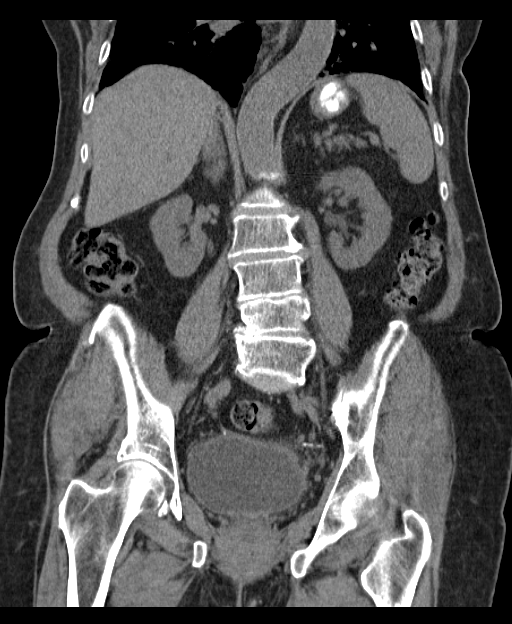

[Series 6: hematuria post axial 5.0 b40f · axial · 0.71mm/px · z∈[-376,-26]mm · 8 of 90 slices shown, 13 images]
[im 10/90  soft-tissue]
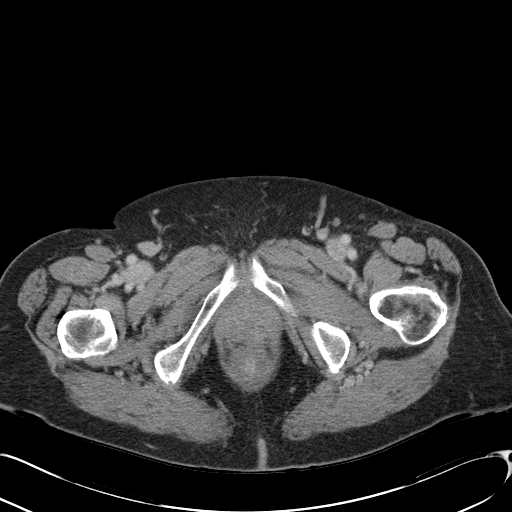
[im 10/90  bone]
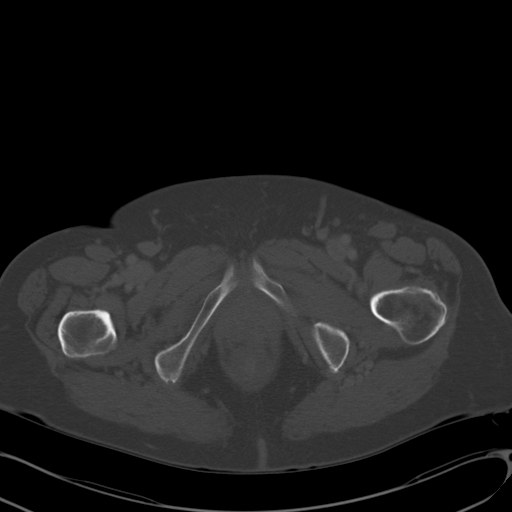
[im 20/90  soft-tissue]
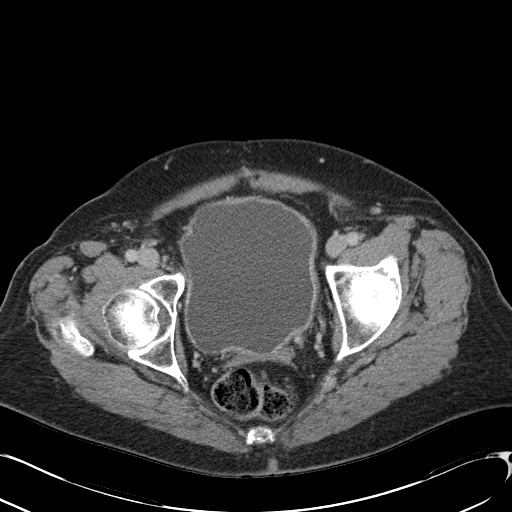
[im 30/90  soft-tissue]
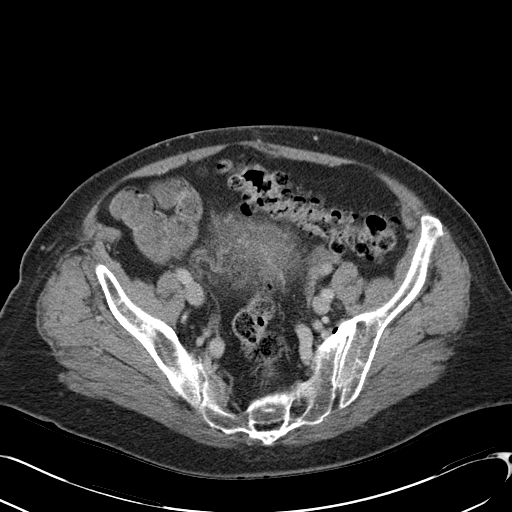
[im 40/90  soft-tissue]
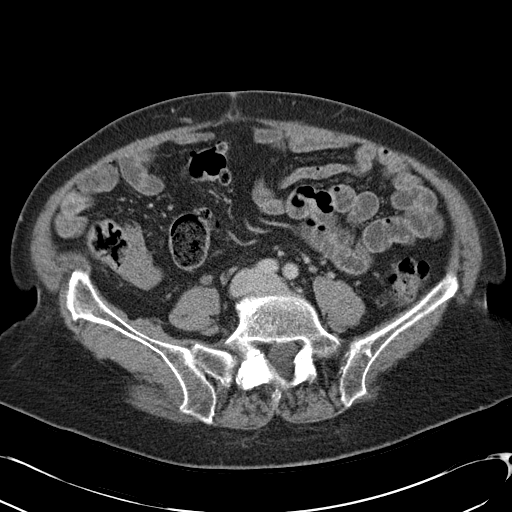
[im 50/90  soft-tissue]
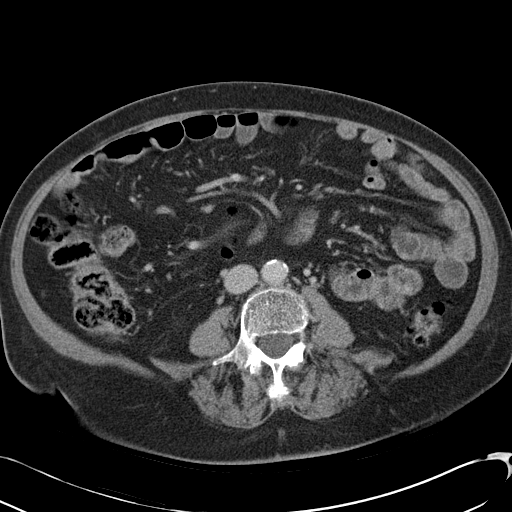
[im 50/90  lung]
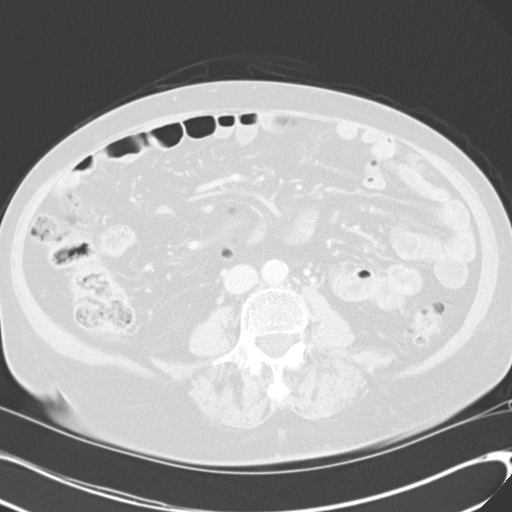
[im 60/90  soft-tissue]
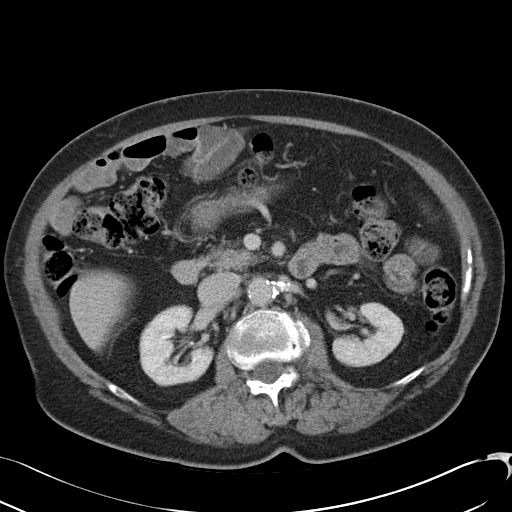
[im 60/90  lung]
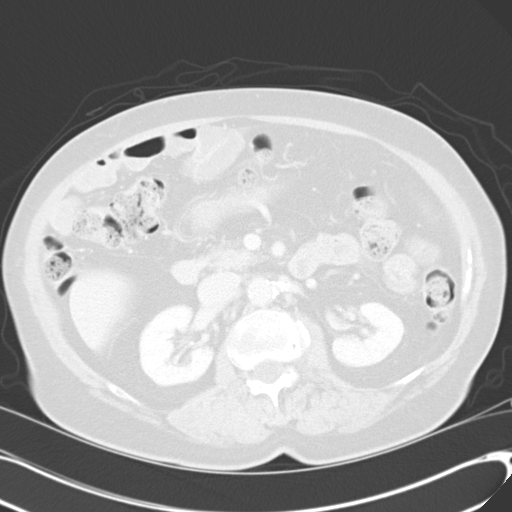
[im 70/90  soft-tissue]
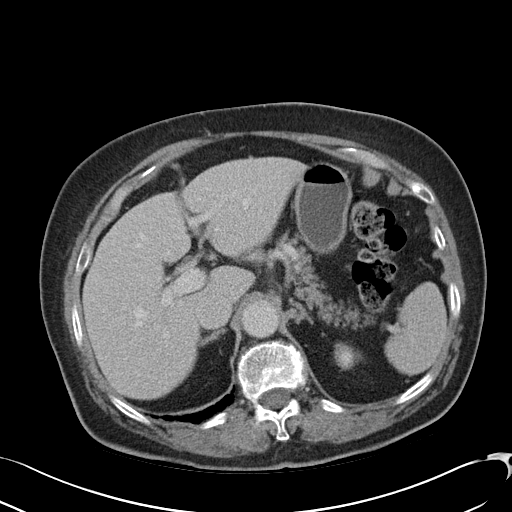
[im 70/90  lung]
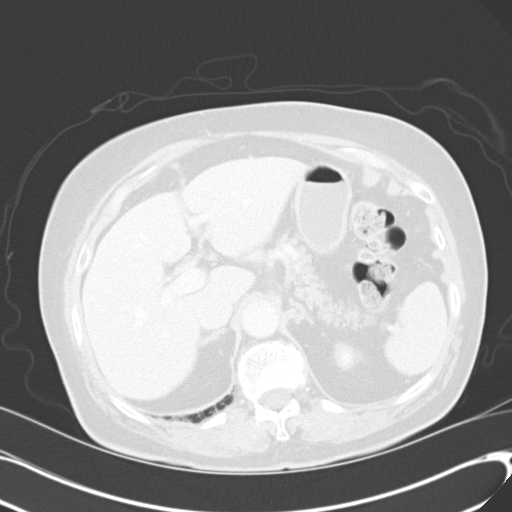
[im 80/90  soft-tissue]
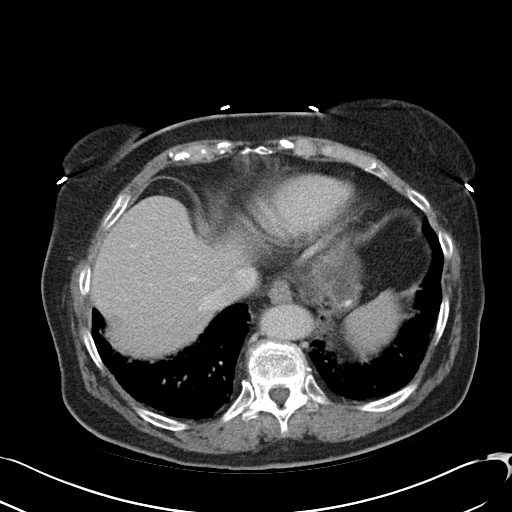
[im 80/90  lung]
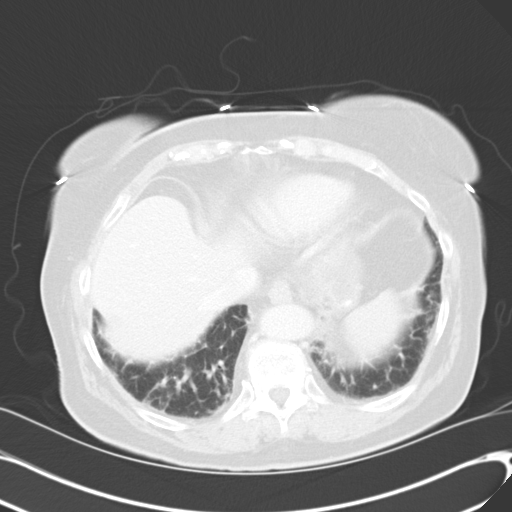

[10 of 46 positions shown; findings below may reference images not displayed]

FINDINGS: Lower chest: mild subpleural interstitial prominence at the lung
bases. Mild cardiomegaly, without pericardial or pleural effusion.

Hepatobiliary: A too small to characterize lateral segment left
liver lobe lesion is likely a cyst. Stone filled gallbladder,
without surrounding inflammation or biliary duct dilatation.

Pancreas: Normal, without mass or ductal dilatation.

Spleen: Normal in size, without focal abnormality.

Adrenals/Urinary Tract: Normal adrenal glands. calcification medial
to the right kidney on image 30/series 2 favored to be vascular.
Otherwise, no renal calculi or hydronephrosis. No hydroureter. A
calcification along the anterior aspect of the distal right ureter
on image 64 is positioned just anterior to the ureter, when
correlated with post-contrast image 64/series 11.

Interpolar left renal cyst of 2.9 cm. No suspicious renal mass.
Moderate renal collecting system opacification on delayed images.
Good ureteric opacification, without filling defect identified. The
bladder is not well opacified on delayed images. Large amount of
non-opacified urine remains within. The bladder wall is mildly
irregular with mucosal hyper enhancement. Wall irregularity, with a
suggestion of early saccule formation. Small right-sided bladder
diverticulum.

Stomach/Bowel: Normal stomach, without wall thickening. Extensive
colonic diverticulosis. Normal terminal ileum. Normal small bowel.

Vascular/Lymphatic: Aortic and branch vessel atherosclerosis. No
abdominopelvic adenopathy.

Reproductive: Hysterectomy.  No adnexal mass.

Other: No significant free fluid.

Musculoskeletal: Mild osteopenia. Convex right curvature of the
lumbar spine. L5 pars defects bilaterally.
IMPRESSION: 1. Bladder wall irregularity with mucosal hyper enhancement and a
right-sided bladder diverticulum. Findings suggest a component of
bladder outlet obstruction. Concurrent cystitis is suspected.
2. No other explanation for hematuria.
3. Cholelithiasis.
4. Suspicion of mild interstitial lung disease at the bases. The
clinical history describes chronic cough. Consider pulmonary consult
and possible high-resolution chest CT.

## 2016-05-27 LAB — URINE CULTURE: CULTURE: NO GROWTH

## 2016-06-08 ENCOUNTER — Encounter (HOSPITAL_COMMUNITY): Payer: PPO | Attending: Oncology

## 2016-06-08 ENCOUNTER — Other Ambulatory Visit (HOSPITAL_COMMUNITY): Payer: Self-pay | Admitting: Oncology

## 2016-06-08 ENCOUNTER — Encounter (HOSPITAL_COMMUNITY): Payer: PPO

## 2016-06-08 VITALS — BP 127/70 | HR 93 | Temp 98.3°F | Resp 18 | Wt 153.6 lb

## 2016-06-08 DIAGNOSIS — D649 Anemia, unspecified: Secondary | ICD-10-CM

## 2016-06-08 DIAGNOSIS — C50411 Malignant neoplasm of upper-outer quadrant of right female breast: Secondary | ICD-10-CM | POA: Insufficient documentation

## 2016-06-08 DIAGNOSIS — E538 Deficiency of other specified B group vitamins: Secondary | ICD-10-CM | POA: Insufficient documentation

## 2016-06-08 DIAGNOSIS — Z170 Estrogen receptor positive status [ER+]: Secondary | ICD-10-CM | POA: Insufficient documentation

## 2016-06-08 DIAGNOSIS — M810 Age-related osteoporosis without current pathological fracture: Secondary | ICD-10-CM

## 2016-06-08 HISTORY — DX: Deficiency of other specified B group vitamins: E53.8

## 2016-06-08 LAB — IRON AND TIBC
IRON: 36 ug/dL (ref 28–170)
SATURATION RATIOS: 8 % — AB (ref 10.4–31.8)
TIBC: 451 ug/dL — AB (ref 250–450)
UIBC: 415 ug/dL

## 2016-06-08 LAB — VITAMIN B12: Vitamin B-12: 81 pg/mL — ABNORMAL LOW (ref 180–914)

## 2016-06-08 LAB — COMPREHENSIVE METABOLIC PANEL
ALBUMIN: 4 g/dL (ref 3.5–5.0)
ALT: 13 U/L — ABNORMAL LOW (ref 14–54)
ANION GAP: 6 (ref 5–15)
AST: 19 U/L (ref 15–41)
Alkaline Phosphatase: 70 U/L (ref 38–126)
BILIRUBIN TOTAL: 0.6 mg/dL (ref 0.3–1.2)
BUN: 16 mg/dL (ref 6–20)
CO2: 29 mmol/L (ref 22–32)
Calcium: 8.9 mg/dL (ref 8.9–10.3)
Chloride: 104 mmol/L (ref 101–111)
Creatinine, Ser: 0.87 mg/dL (ref 0.44–1.00)
GFR calc Af Amer: >60 mL/min (ref >60)
GFR calc non Af Amer: >60 mL/min (ref >60)
GLUCOSE: 157 mg/dL — AB (ref 65–99)
POTASSIUM: 3.6 mmol/L (ref 3.5–5.1)
SODIUM: 139 mmol/L (ref 135–145)
TOTAL PROTEIN: 7.4 g/dL (ref 6.5–8.1)

## 2016-06-08 LAB — FOLATE: FOLATE: 47.8 ng/mL (ref >5.9)

## 2016-06-08 LAB — CBC
HEMATOCRIT: 34.8 % — AB (ref 36.0–46.0)
HEMOGLOBIN: 11.2 g/dL — AB (ref 12.0–15.0)
MCH: 28.3 pg (ref 26.0–34.0)
MCHC: 32.2 g/dL (ref 30.0–36.0)
MCV: 87.9 fL (ref 78.0–100.0)
Platelets: 218 10*3/uL (ref 150–400)
RBC: 3.96 MIL/uL (ref 3.87–5.11)
RDW: 14.8 % (ref 11.5–15.5)
WBC: 6.5 10*3/uL (ref 4.0–10.5)

## 2016-06-08 LAB — FERRITIN: Ferritin: 8 ng/mL — ABNORMAL LOW (ref 11–307)

## 2016-06-08 MED ORDER — DENOSUMAB 60 MG/ML ~~LOC~~ SOLN
60.0000 mg | Freq: Once | SUBCUTANEOUS | Status: AC
  Administered 2016-06-08: 60 mg via SUBCUTANEOUS
  Filled 2016-06-08: qty 1

## 2016-06-08 NOTE — Patient Instructions (Signed)
[facility] at [facility] Discharge Instructions  RECOMMENDATIONS MADE BY THE CONSULTANT AND ANY TEST RESULTS WILL BE SENT TO YOUR REFERRING PHYSICIAN.  Prolia 60 mg injection given as ordered. Return as scheduled.  Thank you for choosing [facility] at [facility] to provide your oncology and hematology care.  To afford each patient quality time with our provider, please arrive at least 15 minutes before your scheduled appointment time.   Beginning January 23rd 2017 lab work for the Cancer Center will be done in the  Main lab at [facility] on 1st floor. If you have a lab appointment with the Cancer Center please come in thru the  Main Entrance and check in at the main information desk  You need to re-schedule your appointment should you arrive 10 or more minutes late.  We strive to give you quality time with our providers, and arriving late affects you and other patients whose appointments are after yours.  Also, if you no show three or more times for appointments you may be dismissed from the clinic at the providers discretion.     Again, thank you for choosing [facility].  Our hope is that these requests will decrease the amount of time that you wait before being seen by our physicians.       _____________________________________________________________  Should you have questions after your visit to  Cancer Center, please contact our office at [phone] between the hours of 8:30 a.m. and 4:30 p.m.  Voicemails left after 4:30 p.m. will not be returned until the following business day.  For prescription refill requests, have your pharmacy contact our office.         Resources For Cancer Patients and their Caregivers ? American Cancer Society: Can assist with transportation, wigs, general needs, runs Look Good Feel Better.        1-888-227-6333 ? Cancer Care: Provides financial assistance, online support groups,  medication/co-pay assistance.  1-800-813-HOPE (4673) ? [facility] Assists [place] cancer patients and their families through emotional , educational and financial support.  336-427-4357 ? [place] DSS Where to apply for food stamps, Medicaid and utility assistance. 336-342-1394 ? RCATS: Transportation to medical appointments. 336-347-2287 ? Social Security Administration: May apply for disability if have a Stage IV cancer. 336-342-7796 1-800-772-1213 ? [place] Aging, Disability and Transit Services: Assists with nutrition, care and transit needs. 336-349-2343  Cancer Center Support Programs: @10RELATIVEDAYS@ > Cancer Support Group  2nd Tuesday of the month 1pm-2pm, Journey Room  > Creative Journey  3rd Tuesday of the month 1130am-1pm, Journey Room  > Look Good Feel Better  1st Wednesday of the month 10am-12 noon, Journey Room (Call American Cancer Society to register [phone])   

## 2016-06-08 NOTE — Progress Notes (Signed)
Tamara [name] presents today for injection per MD orders. Prolia 60 mg administered SQ in right Abdomen. Administration without incident. Patient tolerated well.

## 2016-06-10 ENCOUNTER — Encounter (HOSPITAL_COMMUNITY): Payer: Self-pay

## 2016-06-10 ENCOUNTER — Ambulatory Visit (HOSPITAL_COMMUNITY): Payer: PPO

## 2016-06-10 ENCOUNTER — Encounter (HOSPITAL_BASED_OUTPATIENT_CLINIC_OR_DEPARTMENT_OTHER): Payer: PPO

## 2016-06-10 ENCOUNTER — Encounter (HOSPITAL_COMMUNITY): Payer: PPO

## 2016-06-10 ENCOUNTER — Other Ambulatory Visit (HOSPITAL_COMMUNITY): Payer: PPO

## 2016-06-10 VITALS — BP 133/77 | HR 79 | Temp 98.1°F | Resp 18

## 2016-06-10 DIAGNOSIS — M810 Age-related osteoporosis without current pathological fracture: Secondary | ICD-10-CM

## 2016-06-10 DIAGNOSIS — E538 Deficiency of other specified B group vitamins: Secondary | ICD-10-CM | POA: Diagnosis not present

## 2016-06-10 DIAGNOSIS — C50411 Malignant neoplasm of upper-outer quadrant of right female breast: Secondary | ICD-10-CM | POA: Diagnosis not present

## 2016-06-10 MED ORDER — CYANOCOBALAMIN 1000 MCG/ML IJ SOLN
INTRAMUSCULAR | Status: AC
  Filled 2016-06-10: qty 1

## 2016-06-10 MED ORDER — CYANOCOBALAMIN 1000 MCG/ML IJ SOLN
1000.0000 ug | Freq: Once | INTRAMUSCULAR | Status: AC
  Administered 2016-06-10: 1000 ug via INTRAMUSCULAR

## 2016-06-10 NOTE — Progress Notes (Signed)
Tamara [name] presents today for injection per the provider's orders.  B12 administration without incident; see MAR for injection details.  Patient tolerated procedure well and without incident.  No questions or complaints noted at this time.

## 2016-06-10 NOTE — Patient Instructions (Signed)
[place] at [facility] Discharge Instructions  RECOMMENDATIONS MADE BY THE CONSULTANT AND ANY TEST RESULTS WILL BE SENT TO YOUR REFERRING PHYSICIAN.  B12 injection today. Return as scheduled for injections. Return as scheduled for infusion. Return as scheduled for lab work and office visit.   Thank you for choosing [place] at [facility] to provide your oncology and hematology care.  To afford each patient quality time with our provider, please arrive at least 15 minutes before your scheduled appointment time.   Beginning January 23rd 2017 lab work for the [facility] will be done in the  Main lab at [facility] on 1st floor. If you have a lab appointment with the [place] please come in thru the  Main Entrance and check in at the main information desk  You need to re-schedule your appointment should you arrive 10 or more minutes late.  We strive to give you quality time with our providers, and arriving late affects you and other patients whose appointments are after yours.  Also, if you no show three or more times for appointments you may be dismissed from the clinic at the providers discretion.     Again, thank you for choosing [facility].  Our hope is that these requests will decrease the amount of time that you wait before being seen by our physicians.       _____________________________________________________________  Should you have questions after your visit to [facility], please contact our office at [phone] between the hours of 8:30 a.m. and 4:30 p.m.  Voicemails left after 4:30 p.m. will not be returned until the following business day.  For prescription refill requests, have your pharmacy contact our office.         Resources For Cancer Patients and their Caregivers ? American Cancer Society: Can assist with transportation, wigs, general needs, runs Look Good Feel Better.         ([phone] ? Cancer Care: Provides financial assistance, online support groups, medication/co-pay assistance.  1-800-813-HOPE 817-712-3501) ? [place] Assists [place] cancer patients and their families through emotional , educational and financial support.  (567)418-2319 ? [place] DSS Where to apply for food stamps, Medicaid and utility assistance. (779)573-3398 ? RCATS: Transportation to medical appointments. 314-878-8582 ? Social Security Administration: May apply for disability if have a Stage IV cancer. [phone] ? [employer], Disability and Transit Services: Assists with nutrition, care and transit needs. [place] Support Programs: @10RELATIVEDAYS @ > Cancer Support Group  2nd Tuesday of the month 1pm-2pm, Journey Room  > Creative Journey  3rd Tuesday of the month 1130am-1pm, Journey Room  > Look Good Feel Better  1st Wednesday of the month 10am-12 noon, Journey Room (Call [place] to register [phone])

## 2016-06-12 LAB — INTRINSIC FACTOR ANTIBODIES: Intrinsic Factor: 1.1 AU/mL (ref 0.0–1.1)

## 2016-06-12 LAB — HOMOCYSTEINE: Homocysteine: 48.2 umol/L — ABNORMAL HIGH (ref 0.0–15.0)

## 2016-06-15 LAB — ANTI-PARIETAL ANTIBODY: Parietal Cell Antibody-IgG: 51.8 Units — ABNORMAL HIGH (ref 0.0–20.0)

## 2016-06-15 LAB — METHYLMALONIC ACID, SERUM: Methylmalonic Acid, Quantitative: 2031 nmol/L — ABNORMAL HIGH (ref 0–378)

## 2016-06-19 ENCOUNTER — Encounter (HOSPITAL_COMMUNITY): Payer: PPO | Attending: Hematology & Oncology

## 2016-06-19 ENCOUNTER — Encounter (HOSPITAL_COMMUNITY): Payer: Self-pay

## 2016-06-19 VITALS — BP 132/60 | HR 73 | Temp 97.6°F | Resp 18

## 2016-06-19 DIAGNOSIS — E538 Deficiency of other specified B group vitamins: Secondary | ICD-10-CM | POA: Diagnosis not present

## 2016-06-19 DIAGNOSIS — D509 Iron deficiency anemia, unspecified: Secondary | ICD-10-CM | POA: Diagnosis not present

## 2016-06-19 DIAGNOSIS — M810 Age-related osteoporosis without current pathological fracture: Secondary | ICD-10-CM

## 2016-06-19 MED ORDER — SODIUM CHLORIDE 0.9 % IV SOLN
Freq: Once | INTRAVENOUS | Status: AC
  Administered 2016-06-19: 14:00:00 via INTRAVENOUS

## 2016-06-19 MED ORDER — SODIUM CHLORIDE 0.9 % IV SOLN
510.0000 mg | Freq: Once | INTRAVENOUS | Status: AC
  Administered 2016-06-19: 510 mg via INTRAVENOUS
  Filled 2016-06-19: qty 17

## 2016-06-19 MED ORDER — CYANOCOBALAMIN 1000 MCG/ML IJ SOLN
1000.0000 ug | Freq: Once | INTRAMUSCULAR | Status: AC
  Administered 2016-06-19: 1000 ug via INTRAMUSCULAR
  Filled 2016-06-19: qty 1

## 2016-06-19 NOTE — Patient Instructions (Signed)
[place] at [facility] Discharge Instructions  RECOMMENDATIONS MADE BY THE CONSULTANT AND ANY TEST RESULTS WILL BE SENT TO YOUR REFERRING PHYSICIAN. Iron infusion today. Return as scheduled for B12 injections.   Thank you for choosing [place] at [facility] to provide your oncology and hematology care.  To afford each patient quality time with our provider, please arrive at least 15 minutes before your scheduled appointment time.   Beginning January 23rd 2017 lab work for the [facility] will be done in the  Main lab at [facility] on 1st floor. If you have a lab appointment with the [place] please come in thru the  Main Entrance and check in at the main information desk  You need to re-schedule your appointment should you arrive 10 or more minutes late.  We strive to give you quality time with our providers, and arriving late affects you and other patients whose appointments are after yours.  Also, if you no show three or more times for appointments you may be dismissed from the clinic at the providers discretion.     Again, thank you for choosing [facility].  Our hope is that these requests will decrease the amount of time that you wait before being seen by our physicians.       _____________________________________________________________  Should you have questions after your visit to [facility], please contact our office at [phone] between the hours of 8:30 a.m. and 4:30 p.m.  Voicemails left after 4:30 p.m. will not be returned until the following business day.  For prescription refill requests, have your pharmacy contact our office.         Resources For Cancer Patients and their Caregivers ? American Cancer Society: Can assist with transportation, wigs, general needs, runs Look Good Feel Better.        3194530848 ? Cancer Care: Provides financial assistance, online support groups,  medication/co-pay assistance.  1-800-813-HOPE 406-564-0352) ? [place] Assists [place] cancer patients and their families through emotional , educational and financial support.  ([phone] ? [place] DSS Where to apply for food stamps, Medicaid and utility assistance. 619-003-0175 ? RCATS: Transportation to medical appointments. 361-810-5106 ? Social Security Administration: May apply for disability if have a Stage IV cancer. [phone] 240-616-9281 ? [employer], Disability and Transit Services: Assists with nutrition, care and transit needs. [place] Support Programs: @10RELATIVEDAYS @ > Cancer Support Group  2nd Tuesday of the month 1pm-2pm, Journey Room  > Creative Journey  3rd Tuesday of the month 1130am-1pm, Journey Room  > Look Good Feel Better  1st Wednesday of the month 10am-12 noon, Journey Room (Call [place] to register [phone])

## 2016-06-19 NOTE — Progress Notes (Signed)
Tolerated infusion w/o adverse reaction. Alert, in no distress.  VSS.  Discharged ambulatory.   Tamara [name] presents today for injection per the provider's orders.  B12 administration without incident; see MAR for injection details.  Patient tolerated procedure well and without incident.  No questions or complaints noted at this time.

## 2016-06-25 ENCOUNTER — Encounter (HOSPITAL_COMMUNITY): Payer: Self-pay

## 2016-06-25 ENCOUNTER — Encounter (HOSPITAL_BASED_OUTPATIENT_CLINIC_OR_DEPARTMENT_OTHER): Payer: PPO

## 2016-06-25 VITALS — BP 140/66 | HR 79 | Temp 98.2°F | Resp 18

## 2016-06-25 DIAGNOSIS — M810 Age-related osteoporosis without current pathological fracture: Secondary | ICD-10-CM

## 2016-06-25 DIAGNOSIS — E538 Deficiency of other specified B group vitamins: Secondary | ICD-10-CM | POA: Diagnosis not present

## 2016-06-25 MED ORDER — CYANOCOBALAMIN 1000 MCG/ML IJ SOLN
1000.0000 ug | Freq: Once | INTRAMUSCULAR | Status: AC
  Administered 2016-06-25: 1000 ug via INTRAMUSCULAR
  Filled 2016-06-25: qty 1

## 2016-06-25 NOTE — Progress Notes (Signed)
Tamara [name] presents today for injection per MD orders. B12 1,022mcg administered IM in right Upper Arm. Administration without incident. Patient tolerated well.Vitals stable and discharged from clinic ambulatory.

## 2016-06-25 NOTE — Patient Instructions (Signed)
[facility] at [facility] Discharge Instructions  RECOMMENDATIONS MADE BY THE CONSULTANT AND ANY TEST RESULTS WILL BE SENT TO YOUR REFERRING PHYSICIAN.  B12 injection given. Follow up as scheduled.  Thank you for choosing [facility] at [facility] to provide your oncology and hematology care.  To afford each patient quality time with our provider, please arrive at least 15 minutes before your scheduled appointment time.   Beginning January 23rd 2017 lab work for the Cancer Center will be done in the  Main lab at [facility] on 1st floor. If you have a lab appointment with the Cancer Center please come in thru the  Main Entrance and check in at the main information desk  You need to re-schedule your appointment should you arrive 10 or more minutes late.  We strive to give you quality time with our providers, and arriving late affects you and other patients whose appointments are after yours.  Also, if you no show three or more times for appointments you may be dismissed from the clinic at the providers discretion.     Again, thank you for choosing [facility].  Our hope is that these requests will decrease the amount of time that you wait before being seen by our physicians.       _____________________________________________________________  Should you have questions after your visit to [facility], please contact our office at [phone] between the hours of 8:30 a.m. and 4:30 p.m.  Voicemails left after 4:30 p.m. will not be returned until the following business day.  For prescription refill requests, have your pharmacy contact our office.         Resources For Cancer Patients and their Caregivers ? American Cancer Society: Can assist with transportation, wigs, general needs, runs Look Good Feel Better.        1-888-227-6333 ? Cancer Care: Provides financial assistance, online support groups, medication/co-pay  assistance.  1-800-813-HOPE (4673) ? [facility] Assists [place] cancer patients and their families through emotional , educational and financial support.  336-427-4357 ? [place] DSS Where to apply for food stamps, Medicaid and utility assistance. 336-342-1394 ? RCATS: Transportation to medical appointments. 336-347-2287 ? Social Security Administration: May apply for disability if have a Stage IV cancer. 336-342-7796 1-800-772-1213 ? [place] Aging, Disability and Transit Services: Assists with nutrition, care and transit needs. 336-349-2343  Cancer Center Support Programs: @10RELATIVEDAYS@ > Cancer Support Group  2nd Tuesday of the month 1pm-2pm, Journey Room  > Creative Journey  3rd Tuesday of the month 1130am-1pm, Journey Room  > Look Good Feel Better  1st Wednesday of the month 10am-12 noon, Journey Room (Call American Cancer [employer] to register [phone])   

## 2016-07-01 ENCOUNTER — Encounter (HOSPITAL_BASED_OUTPATIENT_CLINIC_OR_DEPARTMENT_OTHER): Payer: PPO

## 2016-07-01 VITALS — BP 129/70 | HR 86 | Temp 98.2°F | Resp 18

## 2016-07-01 DIAGNOSIS — E538 Deficiency of other specified B group vitamins: Secondary | ICD-10-CM | POA: Diagnosis not present

## 2016-07-01 DIAGNOSIS — M810 Age-related osteoporosis without current pathological fracture: Secondary | ICD-10-CM

## 2016-07-01 MED ORDER — CYANOCOBALAMIN 1000 MCG/ML IJ SOLN
1000.0000 ug | Freq: Once | INTRAMUSCULAR | Status: AC
  Administered 2016-07-01: 1000 ug via INTRAMUSCULAR

## 2016-07-01 MED ORDER — CYANOCOBALAMIN 1000 MCG/ML IJ SOLN
INTRAMUSCULAR | Status: AC
  Filled 2016-07-01: qty 1

## 2016-07-01 NOTE — Progress Notes (Signed)
Tamara [name] tolerated Vit B12 injection well without complaints or incident. VSS Pt discharged self ambulatory in satisfactory condition 

## 2016-07-01 NOTE — Patient Instructions (Signed)
[facility] at [facility] Discharge Instructions  RECOMMENDATIONS MADE BY THE CONSULTANT AND ANY TEST RESULTS WILL BE SENT TO YOUR REFERRING PHYSICIAN.  Received Vit B12 injection today. Follow-up as scheduled. Call clinic for any questions or concerns  Thank you for choosing  [facility] at [facility] to provide your oncology and hematology care.  To afford each patient quality time with our provider, please arrive at least 15 minutes before your scheduled appointment time.   Beginning January 23rd 2017 lab work for the Cancer Center will be done in the  Main lab at [facility] on 1st floor. If you have a lab appointment with the Cancer Center please come in thru the  Main Entrance and check in at the main information desk  You need to re-schedule your appointment should you arrive 10 or more minutes late.  We strive to give you quality time with our providers, and arriving late affects you and other patients whose appointments are after yours.  Also, if you no show three or more times for appointments you may be dismissed from the clinic at the providers discretion.     Again, thank you for choosing [facility].  Our hope is that these requests will decrease the amount of time that you wait before being seen by our physicians.       _____________________________________________________________  Should you have questions after your visit to [facility], please contact our office at [phone] between the hours of 8:30 a.m. and 4:30 p.m.  Voicemails left after 4:30 p.m. will not be returned until the following business day.  For prescription refill requests, have your pharmacy contact our office.         Resources For Cancer Patients and their Caregivers ? American Cancer Society: Can assist with transportation, wigs, general needs, runs Look Good Feel Better.        1-888-227-6333 ? Cancer Care: Provides  financial assistance, online support groups, medication/co-pay assistance.  1-800-813-HOPE (4673) ? [facility] Assists [place] cancer patients and their families through emotional , educational and financial support.  336-427-4357 ? [place] DSS Where to apply for food stamps, Medicaid and utility assistance. 336-342-1394 ? RCATS: Transportation to medical appointments. 336-347-2287 ? Social Security Administration: May apply for disability if have a Stage IV cancer. 336-342-7796 1-800-772-1213 ? [place] Aging, Disability and Transit Services: Assists with nutrition, care and transit needs. 336-349-2343  Cancer Center Support Programs: @10RELATIVEDAYS@ > Cancer Support Group  2nd Tuesday of the month 1pm-2pm, Journey Room  > Creative Journey  3rd Tuesday of the month 1130am-1pm, Journey Room  > Look Good Feel Better  1st Wednesday of the month 10am-12 noon, Journey Room (Call American Cancer Society to register [phone])   

## 2016-07-06 DIAGNOSIS — E119 Type 2 diabetes mellitus without complications: Secondary | ICD-10-CM | POA: Diagnosis not present

## 2016-07-15 DIAGNOSIS — J8410 Pulmonary fibrosis, unspecified: Secondary | ICD-10-CM | POA: Diagnosis not present

## 2016-07-15 DIAGNOSIS — E1129 Type 2 diabetes mellitus with other diabetic kidney complication: Secondary | ICD-10-CM | POA: Diagnosis not present

## 2016-07-15 DIAGNOSIS — I10 Essential (primary) hypertension: Secondary | ICD-10-CM | POA: Diagnosis not present

## 2016-08-03 ENCOUNTER — Encounter (HOSPITAL_COMMUNITY): Payer: PPO | Attending: Oncology

## 2016-08-03 ENCOUNTER — Other Ambulatory Visit (HOSPITAL_COMMUNITY): Payer: Self-pay | Admitting: Oncology

## 2016-08-03 VITALS — BP 124/67 | HR 78 | Temp 98.4°F | Resp 16 | Wt 153.4 lb

## 2016-08-03 DIAGNOSIS — C50411 Malignant neoplasm of upper-outer quadrant of right female breast: Secondary | ICD-10-CM | POA: Insufficient documentation

## 2016-08-03 DIAGNOSIS — D649 Anemia, unspecified: Secondary | ICD-10-CM | POA: Insufficient documentation

## 2016-08-03 DIAGNOSIS — E538 Deficiency of other specified B group vitamins: Secondary | ICD-10-CM | POA: Diagnosis not present

## 2016-08-03 DIAGNOSIS — Z170 Estrogen receptor positive status [ER+]: Secondary | ICD-10-CM | POA: Insufficient documentation

## 2016-08-03 DIAGNOSIS — M810 Age-related osteoporosis without current pathological fracture: Secondary | ICD-10-CM | POA: Insufficient documentation

## 2016-08-03 MED ORDER — CYANOCOBALAMIN 1000 MCG/ML IJ SOLN
1000.0000 ug | Freq: Once | INTRAMUSCULAR | Status: AC
  Administered 2016-08-03: 1000 ug via INTRAMUSCULAR

## 2016-08-03 MED ORDER — CYANOCOBALAMIN 1000 MCG/ML IJ SOLN
INTRAMUSCULAR | Status: AC
  Filled 2016-08-03: qty 1

## 2016-08-03 NOTE — Patient Instructions (Signed)
[place] at [facility] Discharge Instructions  RECOMMENDATIONS MADE BY THE CONSULTANT AND ANY TEST RESULTS WILL BE SENT TO YOUR REFERRING PHYSICIAN.  Received Vitamin B12 injection today. Follow up as scheduled.  Thank you for choosing [place] at [facility] to provide your oncology and hematology care.  To afford each patient quality time with our provider, please arrive at least 15 minutes before your scheduled appointment time.    If you have a lab appointment with the [place] [facility] please come in thru the  Main Entrance and check in at the main information desk  You need to re-schedule your appointment should you arrive 10 or more minutes late.  We strive to give you quality time with our providers, and arriving late affects you and other patients whose appointments are after yours.  Also, if you no show three or more times for appointments you may be dismissed from the clinic at the providers discretion.     Again, thank you for choosing [facility].  Our hope is that these requests will decrease the amount of time that you wait before being seen by our physicians.       _____________________________________________________________  Should you have questions after your visit to [facility], please contact our office at [phone] between the hours of 8:30 a.m. and 4:30 p.m.  Voicemails left after 4:30 p.m. will not be returned until the following business day.  For prescription refill requests, have your pharmacy contact our office.       Resources For Cancer Patients and their Caregivers ? American Cancer Society: Can assist with transportation, wigs, general needs, runs Look Good Feel Better.        [phone] ? Cancer Care: Provides financial assistance, online support groups, medication/co-pay assistance.  1-800-813-HOPE (858)617-2116) ? [place] Assists [place] cancer  patients and their families through emotional , educational and financial support.  438-514-2844 ? [place] DSS Where to apply for food stamps, Medicaid and utility assistance. ([phone] 324-1122 ? RCATS: Transportation to medical appointments. 743-064-0306 ? Social Security Administration: May apply for disability if have a Stage IV cancer. 559 727 5154 272-177-8177 ? [employer], Disability and Transit Services: Assists with nutrition, care and transit needs. [place] Support Programs: @10RELATIVEDAYS @ > Cancer Support Group  2nd Tuesday of the month 1pm-2pm, Journey Room  > Creative Journey  3rd Tuesday of the month 1130am-1pm, Journey Room  > Look Good Feel Better  1st Wednesday of the month 10am-12 noon, Journey Room (Call [place] to register [phone])

## 2016-08-03 NOTE — Progress Notes (Signed)
Tamara [name] presents today for injection per MD orders. B12  administered SQ in left Upper Arm. Administration without incident. Patient tolerated well.

## 2016-12-07 ENCOUNTER — Encounter (HOSPITAL_COMMUNITY): Payer: PPO | Attending: Oncology

## 2016-12-07 ENCOUNTER — Encounter (HOSPITAL_BASED_OUTPATIENT_CLINIC_OR_DEPARTMENT_OTHER): Payer: PPO

## 2016-12-07 ENCOUNTER — Ambulatory Visit (HOSPITAL_COMMUNITY): Payer: PPO | Admitting: Oncology

## 2016-12-07 ENCOUNTER — Other Ambulatory Visit (HOSPITAL_COMMUNITY): Payer: Self-pay | Admitting: Oncology

## 2016-12-07 VITALS — BP 137/72 | HR 71 | Temp 97.8°F | Resp 16

## 2016-12-07 DIAGNOSIS — Z170 Estrogen receptor positive status [ER+]: Secondary | ICD-10-CM

## 2016-12-07 DIAGNOSIS — Z87891 Personal history of nicotine dependence: Secondary | ICD-10-CM | POA: Diagnosis not present

## 2016-12-07 DIAGNOSIS — C50411 Malignant neoplasm of upper-outer quadrant of right female breast: Secondary | ICD-10-CM | POA: Diagnosis not present

## 2016-12-07 DIAGNOSIS — D509 Iron deficiency anemia, unspecified: Secondary | ICD-10-CM | POA: Insufficient documentation

## 2016-12-07 DIAGNOSIS — I10 Essential (primary) hypertension: Secondary | ICD-10-CM | POA: Diagnosis not present

## 2016-12-07 DIAGNOSIS — M810 Age-related osteoporosis without current pathological fracture: Secondary | ICD-10-CM | POA: Diagnosis not present

## 2016-12-07 DIAGNOSIS — Z780 Asymptomatic menopausal state: Secondary | ICD-10-CM | POA: Insufficient documentation

## 2016-12-07 DIAGNOSIS — E538 Deficiency of other specified B group vitamins: Secondary | ICD-10-CM | POA: Insufficient documentation

## 2016-12-07 LAB — CBC WITH DIFFERENTIAL/PLATELET
BASOS PCT: 0 %
Basophils Absolute: 0 10*3/uL (ref 0.0–0.1)
Eosinophils Absolute: 0.1 10*3/uL (ref 0.0–0.7)
Eosinophils Relative: 1 %
HEMATOCRIT: 37.7 % (ref 36.0–46.0)
Hemoglobin: 12.7 g/dL (ref 12.0–15.0)
LYMPHS ABS: 1.2 10*3/uL (ref 0.7–4.0)
LYMPHS PCT: 18 %
MCH: 31.6 pg (ref 26.0–34.0)
MCHC: 33.7 g/dL (ref 30.0–36.0)
MCV: 93.8 fL (ref 78.0–100.0)
MONO ABS: 0.8 10*3/uL (ref 0.1–1.0)
MONOS PCT: 12 %
NEUTROS ABS: 4.6 10*3/uL (ref 1.7–7.7)
Neutrophils Relative %: 69 %
Platelets: 200 10*3/uL (ref 150–400)
RBC: 4.02 MIL/uL (ref 3.87–5.11)
RDW: 13.9 % (ref 11.5–15.5)
WBC: 6.7 10*3/uL (ref 4.0–10.5)

## 2016-12-07 LAB — COMPREHENSIVE METABOLIC PANEL
ALT: 12 U/L — ABNORMAL LOW (ref 14–54)
ANION GAP: 8 (ref 5–15)
AST: 19 U/L (ref 15–41)
Albumin: 3.9 g/dL (ref 3.5–5.0)
Alkaline Phosphatase: 60 U/L (ref 38–126)
BILIRUBIN TOTAL: 0.8 mg/dL (ref 0.3–1.2)
BUN: 20 mg/dL (ref 6–20)
CO2: 27 mmol/L (ref 22–32)
Calcium: 8.8 mg/dL — ABNORMAL LOW (ref 8.9–10.3)
Chloride: 104 mmol/L (ref 101–111)
Creatinine, Ser: 0.86 mg/dL (ref 0.44–1.00)
Glucose, Bld: 161 mg/dL — ABNORMAL HIGH (ref 65–99)
POTASSIUM: 4 mmol/L (ref 3.5–5.1)
Sodium: 139 mmol/L (ref 135–145)
TOTAL PROTEIN: 7.3 g/dL (ref 6.5–8.1)

## 2016-12-07 MED ORDER — DENOSUMAB 60 MG/ML ~~LOC~~ SOLN
60.0000 mg | Freq: Once | SUBCUTANEOUS | Status: AC
  Administered 2016-12-07: 60 mg via SUBCUTANEOUS
  Filled 2016-12-07: qty 1

## 2016-12-07 MED ORDER — EXEMESTANE 25 MG PO TABS: 25.0000 mg | ORAL_TABLET | Freq: Every day | ORAL | 1 refills | Status: DC

## 2016-12-07 NOTE — Progress Notes (Signed)
Per T.Kefalas,PA, give prolia today. Instructed patient to take Tums 3 times daily for 2 weeks. Tamara [name] presents today for injection per MD orders. Prolia 60 mg administered SQ in right Abdomen. Administration without incident. Patient tolerated well. Stable and ambulatory on discharge home to self.

## 2016-12-07 NOTE — Patient Instructions (Signed)
[place] at [facility] Discharge Instructions  RECOMMENDATIONS MADE BY THE CONSULTANT AND ANY TEST RESULTS WILL BE SENT TO YOUR REFERRING PHYSICIAN.  Prolia 60 mg injection given as ordered. You are to take TUMS 3 times daily for 2 weeks and then you may stop. Return as scheduled.  Thank you for choosing [place] at [facility] to provide your oncology and hematology care.  To afford each patient quality time with our provider, please arrive at least 15 minutes before your scheduled appointment time.    If you have a lab appointment with the [place] please come in thru the  Main Entrance and check in at the main information desk  You need to re-schedule your appointment should you arrive 10 or more minutes late.  We strive to give you quality time with our providers, and arriving late affects you and other patients whose appointments are after yours.  Also, if you no show three or more times for appointments you may be dismissed from the clinic at the providers discretion.     Again, thank you for choosing [facility].  Our hope is that these requests will decrease the amount of time that you wait before being seen by our physicians.       _____________________________________________________________  Should you have questions after your visit to [facility], please contact our office at [phone] between the hours of 8:30 a.m. and 4:30 p.m.  Voicemails left after 4:30 p.m. will not be returned until the following business day.  For prescription refill requests, have your pharmacy contact our office.       Resources For Cancer Patients and their Caregivers ? American Cancer Society: Can assist with transportation, wigs, general needs, runs Look Good Feel Better.        ([phone] ? Cancer Care: Provides financial assistance, online support groups, medication/co-pay assistance.  1-800-813-HOPE  (712)416-0962) ? [place] Assists [place] cancer patients and their families through emotional , educational and financial support.  3464654571 ? [place] DSS Where to apply for food stamps, Medicaid and utility assistance. 754-117-7169 ? [employer]: Transportation to medical appointments. 4153191054 ? Social Security Administration: May apply for disability if have a Stage IV cancer. [phone] ? [employer], Disability and Transit Services: Assists with nutrition, care and transit needs. [place] Support Programs: @10RELATIVEDAYS @ > Cancer Support Group  2nd Tuesday of the month 1pm-2pm, Journey Room  > Creative Journey  3rd Tuesday of the month 1130am-1pm, Journey Room  > Look Good Feel Better  1st Wednesday of the month 10am-12 noon, Journey Room (Call [place] to register [phone])

## 2016-12-15 ENCOUNTER — Encounter (HOSPITAL_COMMUNITY): Payer: Self-pay | Admitting: Oncology

## 2016-12-15 ENCOUNTER — Encounter (HOSPITAL_COMMUNITY): Payer: PPO

## 2016-12-15 ENCOUNTER — Other Ambulatory Visit (HOSPITAL_COMMUNITY): Payer: Self-pay | Admitting: Oncology

## 2016-12-15 ENCOUNTER — Encounter (HOSPITAL_BASED_OUTPATIENT_CLINIC_OR_DEPARTMENT_OTHER): Payer: PPO | Admitting: Oncology

## 2016-12-15 VITALS — BP 120/62 | HR 76 | Temp 98.3°F | Resp 16 | Ht 60.0 in | Wt 150.0 lb

## 2016-12-15 DIAGNOSIS — Z79811 Long term (current) use of aromatase inhibitors: Secondary | ICD-10-CM | POA: Diagnosis not present

## 2016-12-15 DIAGNOSIS — E538 Deficiency of other specified B group vitamins: Secondary | ICD-10-CM

## 2016-12-15 DIAGNOSIS — D509 Iron deficiency anemia, unspecified: Secondary | ICD-10-CM

## 2016-12-15 DIAGNOSIS — M810 Age-related osteoporosis without current pathological fracture: Secondary | ICD-10-CM | POA: Diagnosis not present

## 2016-12-15 DIAGNOSIS — Z170 Estrogen receptor positive status [ER+]: Secondary | ICD-10-CM

## 2016-12-15 DIAGNOSIS — C50411 Malignant neoplasm of upper-outer quadrant of right female breast: Secondary | ICD-10-CM

## 2016-12-15 DIAGNOSIS — Z780 Asymptomatic menopausal state: Secondary | ICD-10-CM

## 2016-12-15 DIAGNOSIS — D62 Acute posthemorrhagic anemia: Secondary | ICD-10-CM | POA: Insufficient documentation

## 2016-12-15 HISTORY — DX: Iron deficiency anemia, unspecified: D50.9

## 2016-12-15 LAB — IRON AND TIBC
Iron: 65 ug/dL (ref 28–170)
Saturation Ratios: 18 % (ref 10.4–31.8)
TIBC: 354 ug/dL (ref 250–450)
UIBC: 289 ug/dL

## 2016-12-15 LAB — VITAMIN B12

## 2016-12-15 LAB — FOLATE: FOLATE: 29.3 ng/mL (ref >5.9)

## 2016-12-15 LAB — FERRITIN: Ferritin: 44 ng/mL (ref 11–307)

## 2016-12-15 MED ORDER — CYANOCOBALAMIN 1000 MCG/ML IJ SOLN
1000.0000 ug | Freq: Once | INTRAMUSCULAR | Status: AC
  Administered 2016-12-15: 1000 ug via INTRAMUSCULAR
  Filled 2016-12-15: qty 1

## 2016-12-15 NOTE — Assessment & Plan Note (Signed)
Osteoporosis at baseline prior to aromatase inhibitor initiation.  Started on Prolia in October 2014 with repeat bone density in September 2016 demonstrates significant improvement in bone density.  Prolia last given on 12/07/2016.  Next Prolia injection is due in November 2018.  Ca++ and Vit D daily encouraged (1200 mg daily and 1000 units daily, respectively).  Next bone density is due in September 2018.  Orders are placed.

## 2016-12-15 NOTE — Progress Notes (Signed)
Tamara Noble, MD [address] [place] [address]  Malignant neoplasm of upper-outer quadrant of right breast in female, estrogen receptor positive ([place]) - Plan: CBC with Differential, Comprehensive metabolic panel  Age-related osteoporosis without current pathological fracture - Plan: DG Bone Density, CBC with Differential, Comprehensive metabolic panel, SCHEDULING COMMUNICATION INJECTION, cyanocobalamin ((VITAMIN B-12)) injection 1,000 mcg  Post-menopausal - Plan: DG Bone Density  B12 deficiency - Plan: Folate, Vitamin B12, Methylmalonic acid, serum, Homocysteine, serum, Vitamin B12, Folate  Iron deficiency anemia, unspecified iron deficiency anemia type - Plan: Iron and TIBC, Ferritin, Iron and TIBC, Ferritin  CURRENT THERAPY: Aromasin beginning on 04/28/2013.  B12 injections monthly. H/O IV iron infusion.  INTERVAL HISTORY: Tamara [name] 83 y.o. 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  S/P right breast lumpectomy by Dr. Arnoldo Morale on 03/08/2013 with one negative sentinel node.  Cancer was identified as ER+ 100%, PR + 85%, Her2 NEGATIVE, and Ki-67 marker 17%.  Oncotype Dx score was 20 with 13% 10-year risk of distant recurrence and absolute benefit of chemotherapy at 10 years in this risk category is negligible at less than 1%.  Aromasin began on 04/28/2013.  BCI testing in April 2017 demonstrated low risk of recurrence and low benefit from extended endocrine therapy. AND Osteoporosis- at baseline prior to aromatase inhibitor initiation.  Started on Prolia in October 2014 and repeat bone density in September 2016 demonstrates significant improvement in bone density. AND B12 deficiency, high-normal intrinsic factor antibody, elevated anti-parietal cell antibody- ?pernicious anemia?  On Lifelong B12 injections. AND Iron deficiency anemia, having required IV iron in recent past.    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  Her2 negative, ER 100%, PR 85%, Ki-67 17%.      03/08/2013 Surgery    Right lumpectomy with sentinel node biopsy revealing adjacent DCIS (intermediate grade).  Lymph node is negative for disease.  T1b N0.      03/24/2013 Imaging    Bone density-  Osteoporosis      04/26/2013 Survivorship    Prolia every 6 months      04/28/2013 -  Chemotherapy    Aromasin daily.  She will take for 5 years.      04/08/2015 Imaging    Bone density- There has been a statistically significant IMPROVEMENT compared to patient's previous exam of 03/24/2013.      11/14/2015 Pathology Results    BCI- low risk of late recurrence (year 5-10) at 3.7%, low likelihood of benefit from extended endocrine therapy, low risk of overall recurrence (years 0-10) 6.6%.        HPI Elements Breast Ca  Location: Right breast  Quality: Invasive mucinous   Severity: Stage IA  Duration: Dx in August 2014  Context: 1 negative sentinel lymph node.  No systemic chemotherapy given.  Timing: Right breast lumpectomy on 03/08/2013 by Dr. Arnoldo Morale  Modifying Factors: ER/PR+, HER2 NEGATIVE.  Oncotype Dx score of 20.  Low risk of recurrence and low benefit from extended endocrine therapy.  Associated Signs & Symptoms:     She is doing well without any aromatase inhibitor specific complaints including arthralgias, myalgias, hot flashes, increased bone pain, etc.  She reports compliance with her exemestane.  She also reports compliance with her calcium and vitamin D.  She has a left subconjunctival  hemorrhage that is improving.  Initiating cause is unknown.  She reports an appetite of 100%.  She reports an energy level of 100%.  She denies any pain.  Review of Systems  Constitutional: Negative.  Negative for chills, fever and weight loss.  HENT: Negative.   Eyes: Positive for  redness.  Respiratory: Negative.  Negative for cough.   Cardiovascular: Negative.  Negative for chest pain.  Gastrointestinal: Negative.  Negative for blood in stool, constipation, diarrhea, melena, nausea and vomiting.  Genitourinary: Negative.   Musculoskeletal: Negative.   Skin: Negative.   Neurological: Negative.  Negative for weakness.  Endo/Heme/Allergies: Negative.   Psychiatric/Behavioral: Negative.     Past Medical History:  Diagnosis Date  . B12 deficiency 06/08/2016  . Breast cancer ([place])   . Breast cancer, right breast ([place]) 04/25/2013  . Cancer ([place])   . Claustrophobia   . Claustrophobia   . Hypertension   . Iron deficiency anemia 12/15/2016  . Malignant neoplasm of upper-outer quadrant of RIGHT female breast ([place]) 04/25/2013   Stage IA (T1b, N0, M0) invasive mucinous breast carcinoma, S/P right breast lumpectomy by Dr. Arnoldo Morale on 03/08/2013 with 1 negative sentinel node. Cancer was identified as ER+ 100%, PR+ 85%, Her2 negative, and Ki-67 marker at 17%. Oncotype Dx score of 20 with 13% 10 year risk of distant recurrence and absolute benefit of chemotherapy at 10 years in this risk category is negligible and less than 1%.  . Osteoporosis     Past Surgical History:  Procedure Laterality Date  . ABDOMINAL HYSTERECTOMY    . CATARACT EXTRACTION, BILATERAL    . PARTIAL MASTECTOMY WITH NEEDLE LOCALIZATION AND AXILLARY SENTINEL LYMPH NODE BX Right 03/08/2013   Procedure: PARTIAL MASTECTOMY WITH NEEDLE LOCALIZATION AND AXILLARY SENTINEL LYMPH NODE BX;  Surgeon: Jamesetta So, MD;  Location: [facility];  Service: General;  Laterality: Right;  Sentinel Node Bx @ 7:30am Needle Loc @ 8:00am    Family History  Problem Relation Age of Onset  . Diabetes Mother   . Cancer Father   . Cancer Sister   . Cancer Brother     Social History   Social History  . Marital status: Widowed    Spouse name: N/A  . Number of children: N/A  . Years of education: N/A   Social History Main  Topics  . Smoking status: Former Smoker    Packs/day: 0.50    Years: 40.00    Types: Cigarettes    Quit date: 03/03/1967  . Smokeless tobacco: Never Used  . Alcohol use No  . Drug use: No  . Sexual activity: Yes    Birth control/ protection: Surgical   Other Topics Concern  . Not on file   Social History Narrative  . No narrative on file     PHYSICAL EXAMINATION  ECOG PERFORMANCE STATUS: 1 - Symptomatic but completely ambulatory  There were no vitals filed for this visit.  BP 120/62 P 76 R 16 T 98.3 O2 sat on RA 99%.  GENERAL:alert, no distress, well nourished, well developed, comfortable, cooperative, smiling and unaccompanied SKIN: skin color, texture, turgor are normal, no rashes or significant lesions HEAD: Normocephalic, No masses, lesions, tenderness or abnormalities EYES: normal, EOMI, Conjunctiva are pink.  Left eye with inferomedial subconjunctival hemorrhage. EARS: External ears normal OROPHARYNX:lips, buccal mucosa, and tongue normal and mucous membranes are moist  NECK: supple, no adenopathy, trachea midline LYMPH:  no palpable lymphadenopathy BREAST:breasts appear normal, no suspicious masses, no skin or nipple changes or axillary  nodes.  Right lumpectomy scar noted in upper outer quadrant. LUNGS: clear to auscultation and percussion HEART: regular rate & rhythm, no murmurs, no gallops, S1 normal and S2 normal ABDOMEN:abdomen soft, non-tender, obese, normal bowel sounds and no masses or organomegaly BACK: Back symmetric, no curvature. EXTREMITIES:less then 2 second capillary refill, no joint deformities, effusion, or inflammation, no edema, no skin discoloration, no clubbing, no cyanosis  NEURO: alert & oriented x 3 with fluent speech, no focal motor/sensory deficits, gait normal   LABORATORY DATA: CBC    Component Value Date/Time   WBC 6.7 12/07/2016 1006   RBC 4.02 12/07/2016 1006   HGB 12.7 12/07/2016 1006   HCT 37.7 12/07/2016 1006   PLT 200  12/07/2016 1006   MCV 93.8 12/07/2016 1006   MCH 31.6 12/07/2016 1006   MCHC 33.7 12/07/2016 1006   RDW 13.9 12/07/2016 1006   LYMPHSABS 1.2 12/07/2016 1006   MONOABS 0.8 12/07/2016 1006   EOSABS 0.1 12/07/2016 1006   BASOSABS 0.0 12/07/2016 1006      Chemistry      Component Value Date/Time   NA 139 12/07/2016 1006   K 4.0 12/07/2016 1006   CL 104 12/07/2016 1006   CO2 27 12/07/2016 1006   BUN 20 12/07/2016 1006   CREATININE 0.86 12/07/2016 1006      Component Value Date/Time   CALCIUM 8.8 (L) 12/07/2016 1006   ALKPHOS 60 12/07/2016 1006   AST 19 12/07/2016 1006   ALT 12 (L) 12/07/2016 1006   BILITOT 0.8 12/07/2016 1006     Lab Results  Component Value Date   IRON 36 06/08/2016   TIBC 451 (H) 06/08/2016   FERRITIN 8 (L) 06/08/2016   Lab Results  Component Value Date   VITAMINB12 81 (L) 06/08/2016   Lab Results  Component Value Date   FOLATE 47.8 06/08/2016     PENDING LABS:   RADIOGRAPHIC STUDIES:  No results found.   PATHOLOGY:    ASSESSMENT AND PLAN:  Malignant neoplasm of upper-outer quadrant of RIGHT female breast (HCC) Stage IA (T1b, N0, M0) invasive mucinous breast carcinoma, S/P right breast lumpectomy by Dr. Arnoldo Morale on 03/08/2013 with 1 negative sentinel node. Cancer was identified as ER+ 100%, PR+ 85%, Her2 negative, and Ki-67 marker at 17%. Oncotype Dx score of 20 with 13% 10 year risk of distant recurrence and absolute benefit of chemotherapy at 10 years in this risk category is negligible and less than 1%.  Aromasin began on 04/28/2013.  BCI testing in April 2017 demonstrated low risk of recurrence and low benefit from extended endocrine therapy.  Labs on 12/07/2016: CBC diff, CMET.  I personally reviewed and went over laboratory results with the patient.  The results are noted within this dictation.  Labs in 6 months: CBC diff, CMET.  I personally reviewed and went over radiographic studies with the patient.  The results are noted within  this dictation.  I personally reviewed the images in PACS.  Mammogram in July 2018 is BIRADS 2.  She is due in ~ 1 month for her next mammogram.  Orders are in.  We will get this scheduled for her her in July 2018.  I have refilled her exemestane, 90 day supply, on 12/07/2016.  I have called [place] to confirm its receipt.  Return in 6 months for follow-up and breast exam.  Osteoporosis Osteoporosis at baseline prior to aromatase inhibitor initiation.  Started on Prolia in October 2014 with repeat bone density in September 2016 demonstrates  significant improvement in bone density.  Prolia last given on 12/07/2016.  Next Prolia injection is due in November 2018.  Ca++ and Vit D daily encouraged (1200 mg daily and 1000 units daily, respectively).  Next bone density is due in September 2018.  Orders are placed.     B12 deficiency B12 deficiency, high-normal intrinsic factor antibody, elevated anti-parietal cell antibody- ?pernicious anemia?  On Lifelong B12 injections.  B12 injection today and monthly.  Labs today: B12, folate, homocysteine, MMA.  Labs in 6 months: B12 and folate  She is provided education regarding Pernicious Anemia.  Iron deficiency anemia Iron deficiency anemia having requiring IV iron replacement therapy.  Oncology Flowsheet 06/19/2016  ferumoxytol [facility]) IV 510 mg   Labs today: iron/TIBC, ferritin.  Labs in 6 months: iron/TIBC, ferritin.  If she requires ongoing IV iron therapy, I will order stool cards and refer her to GI for work-up.   ORDERS PLACED FOR THIS ENCOUNTER: Orders Placed This Encounter  Procedures  . DG Bone Density  . CBC with Differential  . Comprehensive metabolic panel  . Folate  . Vitamin B12  . Methylmalonic acid, serum  . Homocysteine, serum  . Iron and TIBC  . Ferritin  . Vitamin B12  . Folate  . Iron and TIBC  . Ferritin  . SCHEDULING COMMUNICATION INJECTION    MEDICATIONS PRESCRIBED THIS  ENCOUNTER: Meds ordered this encounter  Medications  . cyanocobalamin ((VITAMIN B-12)) injection 1,000 mcg    THERAPY PLAN:  Continue to monitor B12 and iron studies.  Continue monthly B12 injections.  Continue Prolia for bone health.  Continue Exemesatne daily to complete 5 years worth of AI therapy.  NCCN guidelines recommends the following surveillance for invasive breast cancer (1.2018):  A. History and Physical exam 1-4 times per year as clinically appropriate for 5 years, then annually.  B. Periodic screening for changes in family history and referral to genetics counseling as indicated  C. Educate, monitor, and refer to lymphedema management.  D. Mammography every 12 months  E. Routine imaging of reconstructed breast is not indicated.  F. In the absence of clinical signs and symptoms suggestive of recurrent disease, there is no indication for laboratory or imaging studies for metastases screening.  G. Women on Tamoxifen: annual gynecologic assessment every 12 months if uterus is present.  H. Women on aromatase inhibitor or who experience ovarian failure secondary to treatment should have monitoring of bone health with a bone mineral density determination at baseline and periodically thereafter.  I. Assess and encourage adherence to adjuvant endocrine therapy.  J. Evidence suggests that active lifestyle, healthy diet, limited alcohol intake, and achieving and maintaining an ideal body weight (20-25 BMI) may lead to optimal breast cancer outcomes.   All questions were answered. The patient knows to call the clinic with any problems, questions or concerns. We can certainly see the patient much sooner if necessary.  Patient and plan discussed with Dr. Twana First and she is in agreement with the aforementioned.   This note is electronically signed by: Doy Mince 12/15/2016 10:54 AM

## 2016-12-15 NOTE — Patient Instructions (Signed)
[place] at [facility] Discharge Instructions  RECOMMENDATIONS MADE BY THE CONSULTANT AND ANY TEST RESULTS WILL BE SENT TO YOUR REFERRING PHYSICIAN.  You were seen today by Kirby Crigler PA-C. Labs today, we will call you with those results. B12 injection given today, continue taking monthly. Mammogram is due in July and Bone Density in September. Return in 6 months for follow up.   Thank you for choosing [place] at [facility] to provide your oncology and hematology care.  To afford each patient quality time with our provider, please arrive at least 15 minutes before your scheduled appointment time.    If you have a lab appointment with the [place] please come in thru the  Main Entrance and check in at the main information desk  You need to re-schedule your appointment should you arrive 10 or more minutes late.  We strive to give you quality time with our providers, and arriving late affects you and other patients whose appointments are after yours.  Also, if you no show three or more times for appointments you may be dismissed from the clinic at the providers discretion.     Again, thank you for choosing [facility].  Our hope is that these requests will decrease the amount of time that you wait before being seen by our physicians.       _____________________________________________________________  Should you have questions after your visit to [facility], please contact our office at [phone] between the hours of 8:30 a.m. and 4:30 p.m.  Voicemails left after 4:30 p.m. will not be returned until the following business day.  For prescription refill requests, have your pharmacy contact our office.       Resources For Cancer Patients and their Caregivers ? American Cancer Society: Can assist with transportation, wigs, general needs, runs Look Good Feel Better.        410-200-3781 ? Cancer  Care: Provides financial assistance, online support groups, medication/co-pay assistance.  1-800-813-HOPE (604)430-2874) ? [place] Assists [place] cancer patients and their families through emotional , educational and financial support.  306-043-5410 ? [place] DSS Where to apply for food stamps, Medicaid and utility assistance. 816-011-4660 ? [employer]: Transportation to medical appointments. 903-181-3475 ? Social Security Administration: May apply for disability if have a Stage IV cancer. 314-861-8563 [phone] ? [employer], Disability and Transit Services: Assists with nutrition, care and transit needs. [place] Support Programs: @10RELATIVEDAYS @ > Cancer Support Group  2nd Tuesday of the month 1pm-2pm, Journey Room  > Creative Journey  3rd Tuesday of the month 1130am-1pm, Journey Room  > Look Good Feel Better  1st Wednesday of the month 10am-12 noon, Journey Room (Call [place] to register [phone])

## 2016-12-15 NOTE — Assessment & Plan Note (Addendum)
Iron deficiency anemia having requiring IV iron replacement therapy.  Oncology Flowsheet 06/19/2016  ferumoxytol [facility]) IV 510 mg   Labs today: iron/TIBC, ferritin.  Labs in 6 months: iron/TIBC, ferritin.  If she requires ongoing IV iron therapy, I will order stool cards and refer her to GI for work-up.

## 2016-12-15 NOTE — Assessment & Plan Note (Addendum)
Stage IA (T1b, N0, M0) invasive mucinous breast carcinoma, S/P right breast lumpectomy by Dr. Arnoldo Morale on 03/08/2013 with 1 negative sentinel node. Cancer was identified as ER+ 100%, PR+ 85%, Her2 negative, and Ki-67 marker at 17%. Oncotype Dx score of 20 with 13% 10 year risk of distant recurrence and absolute benefit of chemotherapy at 10 years in this risk category is negligible and less than 1%.  Aromasin began on 04/28/2013.  BCI testing in April 2017 demonstrated low risk of recurrence and low benefit from extended endocrine therapy.  Labs on 12/07/2016: CBC diff, CMET.  I personally reviewed and went over laboratory results with the patient.  The results are noted within this dictation.  Labs in 6 months: CBC diff, CMET.  I personally reviewed and went over radiographic studies with the patient.  The results are noted within this dictation.  I personally reviewed the images in PACS.  Mammogram in July 2018 is BIRADS 2.  She is due in ~ 1 month for her next mammogram.  Orders are in.  We will get this scheduled for her her in July 2018.  I have refilled her exemestane, 90 day supply, on 12/07/2016.  I have called [place] to confirm its receipt.  Return in 6 months for follow-up and breast exam.

## 2016-12-15 NOTE — Assessment & Plan Note (Addendum)
B12 deficiency, high-normal intrinsic factor antibody, elevated anti-parietal cell antibody- ?pernicious anemia?  On Lifelong B12 injections.  B12 injection today and monthly.  Labs today: B12, folate, homocysteine, MMA.  Labs in 6 months: B12 and folate  She is provided education regarding Pernicious Anemia.

## 2016-12-15 NOTE — Progress Notes (Signed)
Pt given B12 injection in right deltoid. Pt tolerated well. Pt stable and discharged home ambulatory.

## 2016-12-15 NOTE — Addendum Note (Signed)
Addended by: Farley Ly on: 12/15/2016 11:00 AM   Modules accepted: Orders

## 2016-12-16 LAB — HOMOCYSTEINE: HOMOCYSTEINE-NORM: 21.7 umol/L — AB (ref 0.0–15.0)

## 2016-12-17 ENCOUNTER — Other Ambulatory Visit: Payer: Self-pay | Admitting: General Surgery

## 2016-12-17 DIAGNOSIS — Z1231 Encounter for screening mammogram for malignant neoplasm of breast: Secondary | ICD-10-CM

## 2016-12-18 LAB — METHYLMALONIC ACID, SERUM: Methylmalonic Acid, Quantitative: 1718 nmol/L — ABNORMAL HIGH (ref 0–378)

## 2016-12-23 ENCOUNTER — Encounter (HOSPITAL_COMMUNITY): Payer: Self-pay

## 2016-12-23 ENCOUNTER — Encounter (HOSPITAL_COMMUNITY): Payer: PPO | Attending: Oncology

## 2016-12-23 VITALS — BP 114/55 | HR 77 | Temp 98.2°F | Resp 18

## 2016-12-23 DIAGNOSIS — E538 Deficiency of other specified B group vitamins: Secondary | ICD-10-CM | POA: Insufficient documentation

## 2016-12-23 DIAGNOSIS — Z87891 Personal history of nicotine dependence: Secondary | ICD-10-CM | POA: Insufficient documentation

## 2016-12-23 DIAGNOSIS — Z780 Asymptomatic menopausal state: Secondary | ICD-10-CM | POA: Insufficient documentation

## 2016-12-23 DIAGNOSIS — I10 Essential (primary) hypertension: Secondary | ICD-10-CM | POA: Insufficient documentation

## 2016-12-23 DIAGNOSIS — Z170 Estrogen receptor positive status [ER+]: Secondary | ICD-10-CM | POA: Insufficient documentation

## 2016-12-23 DIAGNOSIS — C50411 Malignant neoplasm of upper-outer quadrant of right female breast: Secondary | ICD-10-CM | POA: Insufficient documentation

## 2016-12-23 DIAGNOSIS — D509 Iron deficiency anemia, unspecified: Secondary | ICD-10-CM | POA: Insufficient documentation

## 2016-12-23 DIAGNOSIS — M810 Age-related osteoporosis without current pathological fracture: Secondary | ICD-10-CM

## 2016-12-23 MED ORDER — SODIUM CHLORIDE 0.9 % IV SOLN
510.0000 mg | Freq: Once | INTRAVENOUS | Status: AC
  Administered 2016-12-23: 510 mg via INTRAVENOUS
  Filled 2016-12-23: qty 17

## 2016-12-23 MED ORDER — SODIUM CHLORIDE 0.9 % IV SOLN
Freq: Once | INTRAVENOUS | Status: AC
  Administered 2016-12-23: 14:00:00 via INTRAVENOUS

## 2016-12-23 NOTE — Progress Notes (Signed)
Tamara [name] tolerated Feraheme infusion well without complaints or incident. VSS upon discharge. Pt discharged self ambulatory in satisfactory condition

## 2016-12-23 NOTE — Patient Instructions (Signed)
[place] at [facility] Discharge Instructions  RECOMMENDATIONS MADE BY THE CONSULTANT AND ANY TEST RESULTS WILL BE SENT TO YOUR REFERRING PHYSICIAN.  Received Feraheme infusion today. Follow-up as scheduled. [facility] clinic for any questions or concerns  Thank you for choosing [place] at [facility] to provide your oncology and hematology care.  To afford each patient quality time with our provider, please arrive at least 15 minutes before your scheduled appointment time.    If you have a lab appointment with the [place] please come in thru the  Main Entrance and check in at the main information desk  You need to re-schedule your appointment should you arrive 10 or more minutes late.  We strive to give you quality time with our providers, and arriving late affects you and other patients whose appointments are after yours.  Also, if you no show three or more times for appointments you may be dismissed from the clinic at the providers discretion.     Again, thank you for choosing [facility].  Our hope is that these requests will decrease the amount of time that you wait before being seen by our physicians.       _____________________________________________________________  Should you have questions after your visit to [facility], please contact our office at [phone] between the hours of 8:30 a.m. and 4:30 p.m.  Voicemails left after 4:30 p.m. will not be returned until the following business day.  For prescription refill requests, have your pharmacy contact our office.       Resources For Cancer Patients and their Caregivers ? American Cancer Society: Can assist with transportation, wigs, general needs, runs Look Good Feel Better.        952-439-2633 ? Cancer Care: Provides financial assistance, online support groups, medication/co-pay assistance.  1-800-813-HOPE 605-839-5869) ? [place] Assists [place] cancer patients and their families through emotional , educational and financial support.  ([phone] ? [place] DSS Where to apply for food stamps, Medicaid and utility assistance. 336-553-1262 ? RCATS: Transportation to medical appointments. 720-338-0065 ? Social Security Administration: May apply for disability if have a Stage IV cancer. [phone] 830-197-3822 ? [employer], Disability and Transit Services: Assists with nutrition, care and transit needs. [place] Support Programs: @10RELATIVEDAYS @ > Cancer Support Group  2nd Tuesday of the month 1pm-2pm, Journey Room  > Creative Journey  3rd Tuesday of the month 1130am-1pm, Journey Room  > Look Good Feel Better  1st Wednesday of the month 10am-12 noon, Journey Room (Call [place] to register [phone])

## 2017-01-15 ENCOUNTER — Encounter (HOSPITAL_BASED_OUTPATIENT_CLINIC_OR_DEPARTMENT_OTHER): Payer: PPO

## 2017-01-15 VITALS — BP 117/64 | HR 81 | Temp 98.3°F | Resp 18

## 2017-01-15 DIAGNOSIS — M810 Age-related osteoporosis without current pathological fracture: Secondary | ICD-10-CM

## 2017-01-15 DIAGNOSIS — E538 Deficiency of other specified B group vitamins: Secondary | ICD-10-CM | POA: Diagnosis not present

## 2017-01-15 MED ORDER — CYANOCOBALAMIN 1000 MCG/ML IJ SOLN
INTRAMUSCULAR | Status: AC
  Filled 2017-01-15: qty 1

## 2017-01-15 MED ORDER — CYANOCOBALAMIN 1000 MCG/ML IJ SOLN
1000.0000 ug | Freq: Once | INTRAMUSCULAR | Status: AC
  Administered 2017-01-15: 1000 ug via INTRAMUSCULAR

## 2017-01-15 NOTE — Patient Instructions (Signed)
[facility] at [facility] Discharge Instructions  RECOMMENDATIONS MADE BY THE CONSULTANT AND ANY TEST RESULTS WILL BE SENT TO YOUR REFERRING PHYSICIAN.  Vitamin B12 1000 mcg injection given as ordered. Return as scheduled.  Thank you for choosing [facility] at [facility] to provide your oncology and hematology care.  To afford each patient quality time with our provider, please arrive at least 15 minutes before your scheduled appointment time.    If you have a lab appointment with the Cancer Center please come in thru the  Main Entrance and check in at the main information desk  You need to re-schedule your appointment should you arrive 10 or more minutes late.  We strive to give you quality time with our providers, and arriving late affects you and other patients whose appointments are after yours.  Also, if you no show three or more times for appointments you may be dismissed from the clinic at the providers discretion.     Again, thank you for choosing [facility].  Our hope is that these requests will decrease the amount of time that you wait before being seen by our physicians.       _____________________________________________________________  Should you have questions after your visit to [facility], please contact our office at [phone] between the hours of 8:30 a.m. and 4:30 p.m.  Voicemails left after 4:30 p.m. will not be returned until the following business day.  For prescription refill requests, have your pharmacy contact our office.       Resources For Cancer Patients and their Caregivers ? American Cancer Society: Can assist with transportation, wigs, general needs, runs Look Good Feel Better.        1-888-227-6333 ? Cancer Care: Provides financial assistance, online support groups, medication/co-pay assistance.  1-800-813-HOPE (4673) ? [facility] Assists [place] cancer patients and their families through emotional , educational and financial support.  336-427-4357 ? [place] DSS Where to apply for food stamps, Medicaid and utility assistance. 336-342-1394 ? RCATS: Transportation to medical appointments. 336-347-2287 ? Social Security Administration: May apply for disability if have a Stage IV cancer. 336-342-7796 1-800-772-1213 ? [place] Aging, Disability and Transit Services: Assists with nutrition, care and transit needs. 336-349-2343  Cancer Center Support Programs: @10RELATIVEDAYS@ > Cancer Support Group  2nd Tuesday of the month 1pm-2pm, Journey Room  > Creative Journey  3rd Tuesday of the month 1130am-1pm, Journey Room  > Look Good Feel Better  1st Wednesday of the month 10am-12 noon, Journey Room (Call American Cancer [employer] to register [phone])   

## 2017-01-15 NOTE — Progress Notes (Signed)
Tamara [name] presents today for injection per MD orders. B12 1000 mcg administered IM in right Upper Arm. Administration without incident. Patient tolerated well. Stable and ambulatory on discharge home to self.

## 2017-01-25 ENCOUNTER — Other Ambulatory Visit: Payer: Self-pay | Admitting: General Surgery

## 2017-01-25 ENCOUNTER — Other Ambulatory Visit (HOSPITAL_COMMUNITY): Payer: Self-pay | Admitting: Internal Medicine

## 2017-01-25 DIAGNOSIS — Z9889 Other specified postprocedural states: Secondary | ICD-10-CM

## 2017-01-26 ENCOUNTER — Other Ambulatory Visit (HOSPITAL_COMMUNITY): Payer: Self-pay | Admitting: Internal Medicine

## 2017-01-26 DIAGNOSIS — Z9889 Other specified postprocedural states: Secondary | ICD-10-CM

## 2017-01-29 DIAGNOSIS — R413 Other amnesia: Secondary | ICD-10-CM | POA: Diagnosis not present

## 2017-01-29 DIAGNOSIS — N390 Urinary tract infection, site not specified: Secondary | ICD-10-CM | POA: Diagnosis not present

## 2017-01-29 DIAGNOSIS — I10 Essential (primary) hypertension: Secondary | ICD-10-CM | POA: Diagnosis not present

## 2017-02-02 ENCOUNTER — Ambulatory Visit (HOSPITAL_COMMUNITY)
Admission: RE | Admit: 2017-02-02 | Discharge: 2017-02-02 | Disposition: A | Discharge status: Home / Self Care | Payer: PPO | Source: Ambulatory Visit | Attending: Internal Medicine | Admitting: Internal Medicine

## 2017-02-02 ENCOUNTER — Inpatient Hospital Stay (HOSPITAL_COMMUNITY): Admission: RE | Admit: 2017-02-02 | Payer: PPO | Source: Ambulatory Visit

## 2017-02-02 DIAGNOSIS — R928 Other abnormal and inconclusive findings on diagnostic imaging of breast: Secondary | ICD-10-CM | POA: Diagnosis not present

## 2017-02-02 DIAGNOSIS — Z9889 Other specified postprocedural states: Secondary | ICD-10-CM

## 2017-02-02 DIAGNOSIS — Z853 Personal history of malignant neoplasm of breast: Secondary | ICD-10-CM | POA: Insufficient documentation

## 2017-02-02 DIAGNOSIS — N6489 Other specified disorders of breast: Secondary | ICD-10-CM | POA: Insufficient documentation

## 2017-02-03 ENCOUNTER — Other Ambulatory Visit (HOSPITAL_COMMUNITY): Payer: Self-pay | Admitting: Internal Medicine

## 2017-02-03 DIAGNOSIS — R413 Other amnesia: Secondary | ICD-10-CM

## 2017-02-09 ENCOUNTER — Ambulatory Visit (HOSPITAL_COMMUNITY)
Admission: RE | Admit: 2017-02-09 | Discharge: 2017-02-09 | Disposition: A | Discharge status: Home / Self Care | Payer: PPO | Source: Ambulatory Visit | Attending: Internal Medicine | Admitting: Internal Medicine

## 2017-02-09 DIAGNOSIS — R413 Other amnesia: Secondary | ICD-10-CM | POA: Diagnosis not present

## 2017-02-09 DIAGNOSIS — G319 Degenerative disease of nervous system, unspecified: Secondary | ICD-10-CM | POA: Diagnosis not present

## 2017-02-09 DIAGNOSIS — I6782 Cerebral ischemia: Secondary | ICD-10-CM | POA: Diagnosis not present

## 2017-02-15 ENCOUNTER — Ambulatory Visit (HOSPITAL_COMMUNITY): Payer: PPO

## 2017-02-16 ENCOUNTER — Encounter (HOSPITAL_COMMUNITY): Payer: PPO | Attending: Oncology

## 2017-02-16 ENCOUNTER — Encounter (HOSPITAL_COMMUNITY): Payer: Self-pay

## 2017-02-16 VITALS — BP 121/57 | HR 78 | Temp 97.8°F | Resp 16

## 2017-02-16 DIAGNOSIS — E538 Deficiency of other specified B group vitamins: Secondary | ICD-10-CM | POA: Diagnosis not present

## 2017-02-16 DIAGNOSIS — Z170 Estrogen receptor positive status [ER+]: Secondary | ICD-10-CM | POA: Insufficient documentation

## 2017-02-16 DIAGNOSIS — C50411 Malignant neoplasm of upper-outer quadrant of right female breast: Secondary | ICD-10-CM | POA: Insufficient documentation

## 2017-02-16 DIAGNOSIS — I10 Essential (primary) hypertension: Secondary | ICD-10-CM | POA: Diagnosis not present

## 2017-02-16 DIAGNOSIS — Z79899 Other long term (current) drug therapy: Secondary | ICD-10-CM | POA: Diagnosis not present

## 2017-02-16 DIAGNOSIS — D509 Iron deficiency anemia, unspecified: Secondary | ICD-10-CM | POA: Insufficient documentation

## 2017-02-16 DIAGNOSIS — C50911 Malignant neoplasm of unspecified site of right female breast: Secondary | ICD-10-CM | POA: Diagnosis not present

## 2017-02-16 DIAGNOSIS — Z780 Asymptomatic menopausal state: Secondary | ICD-10-CM | POA: Insufficient documentation

## 2017-02-16 DIAGNOSIS — E119 Type 2 diabetes mellitus without complications: Secondary | ICD-10-CM | POA: Diagnosis not present

## 2017-02-16 DIAGNOSIS — Z87891 Personal history of nicotine dependence: Secondary | ICD-10-CM | POA: Insufficient documentation

## 2017-02-16 DIAGNOSIS — I350 Nonrheumatic aortic (valve) stenosis: Secondary | ICD-10-CM | POA: Diagnosis not present

## 2017-02-16 DIAGNOSIS — M810 Age-related osteoporosis without current pathological fracture: Secondary | ICD-10-CM | POA: Diagnosis not present

## 2017-02-16 MED ORDER — CYANOCOBALAMIN 1000 MCG/ML IJ SOLN
INTRAMUSCULAR | Status: AC
  Filled 2017-02-16: qty 1

## 2017-02-16 MED ORDER — CYANOCOBALAMIN 1000 MCG/ML IJ SOLN
1000.0000 ug | Freq: Once | INTRAMUSCULAR | Status: AC
  Administered 2017-02-16: 1000 ug via INTRAMUSCULAR

## 2017-02-16 NOTE — Progress Notes (Signed)
Tamara [name] presents today for injection per MD orders. B12 1000 mcg administered IM in right deltoid. Administration without incident. Patient tolerated well.  Patient discharged ambulatory and in stable condition from clinic. Patient to follow up as scheduled.

## 2017-02-16 NOTE — Patient Instructions (Signed)
[place] at [facility] Discharge Instructions  RECOMMENDATIONS MADE BY THE CONSULTANT AND ANY TEST RESULTS WILL BE SENT TO YOUR REFERRING PHYSICIAN.  You received your B-12 injection today. Continue to get it monthly. Follow up as scheduled.  Thank you for choosing [place] at [facility] to provide your oncology and hematology care.  To afford each patient quality time with our provider, please arrive at least 15 minutes before your scheduled appointment time.    If you have a lab appointment with the [place] please come in thru the  Main Entrance and check in at the main information desk  You need to re-schedule your appointment should you arrive 10 or more minutes late.  We strive to give you quality time with our providers, and arriving late affects you and other patients whose appointments are after yours.  Also, if you no show three or more times for appointments you may be dismissed from the clinic at the providers discretion.     Again, thank you for choosing [facility].  Our hope is that these requests will decrease the amount of time that you wait before being seen by our physicians.       _____________________________________________________________  Should you have questions after your visit to [facility], please contact our office at [phone] between the hours of 8:30 a.m. and 4:30 p.m.  Voicemails left after 4:30 p.m. will not be returned until the following business day.  For prescription refill requests, have your pharmacy contact our office.       Resources For Cancer Patients and their Caregivers ? American Cancer Society: Can assist with transportation, wigs, general needs, runs Look Good Feel Better.        913-300-3197 ? Cancer Care: Provides financial assistance, online support groups, medication/co-pay assistance.  1-800-813-HOPE 409-834-2866) ? [place] Assists [place] cancer patients and their families through emotional , educational and financial support.  (713)807-6868 ? [place] DSS Where to apply for food stamps, Medicaid and utility assistance. 424-246-9102 ? RCATS: Transportation to medical appointments. 337-100-1725 ? Social Security Administration: May apply for disability if have a Stage IV cancer. (315)557-7395 [phone] ? [employer], Disability and Transit Services: Assists with nutrition, care and transit needs. [place] Support Programs: @10RELATIVEDAYS @ > Cancer Support Group  2nd Tuesday of the month 1pm-2pm, Journey Room  > Creative Journey  3rd Tuesday of the month 1130am-1pm, Journey Room  > Look Good Feel Better  1st Wednesday of the month 10am-12 noon, Journey Room (Call [place] to register [phone])

## 2017-02-18 ENCOUNTER — Other Ambulatory Visit (HOSPITAL_COMMUNITY): Payer: Self-pay | Admitting: Adult Health

## 2017-02-18 NOTE — Progress Notes (Signed)
Associated diagnosis for vitamin B12 injection has been updated.   Mike Craze, NP [place] [phone]   Also: Please note that Kirby Crigler, PA-C is no longer full-time at [facility] and please feel free to send time-sensitive coding/billing issues to me.

## 2017-02-23 DIAGNOSIS — Z0001 Encounter for general adult medical examination with abnormal findings: Secondary | ICD-10-CM | POA: Diagnosis not present

## 2017-02-23 DIAGNOSIS — E1129 Type 2 diabetes mellitus with other diabetic kidney complication: Secondary | ICD-10-CM | POA: Diagnosis not present

## 2017-02-23 DIAGNOSIS — R413 Other amnesia: Secondary | ICD-10-CM | POA: Diagnosis not present

## 2017-02-23 DIAGNOSIS — J84112 Idiopathic pulmonary fibrosis: Secondary | ICD-10-CM | POA: Diagnosis not present

## 2017-03-18 ENCOUNTER — Encounter (HOSPITAL_COMMUNITY): Payer: PPO | Attending: Adult Health

## 2017-03-18 ENCOUNTER — Encounter (HOSPITAL_COMMUNITY): Payer: Self-pay

## 2017-03-18 VITALS — BP 95/73 | HR 86 | Temp 98.2°F | Resp 16

## 2017-03-18 DIAGNOSIS — E538 Deficiency of other specified B group vitamins: Secondary | ICD-10-CM | POA: Diagnosis not present

## 2017-03-18 MED ORDER — CYANOCOBALAMIN 1000 MCG/ML IJ SOLN
1000.0000 ug | Freq: Once | INTRAMUSCULAR | Status: AC
  Administered 2017-03-18: 1000 ug via INTRAMUSCULAR

## 2017-03-18 MED ORDER — CYANOCOBALAMIN 1000 MCG/ML IJ SOLN
INTRAMUSCULAR | Status: AC
  Filled 2017-03-18: qty 1

## 2017-03-18 NOTE — Patient Instructions (Signed)
[place] at [facility]  Discharge Instructions:  B12 shot today.  Keep scheduled appointments and call for any concerns or questions.  _______________________________________________________________  Thank you for choosing [place] at [facility] to provide your oncology and hematology care.  To afford each patient quality time with our providers, please arrive at least 15 minutes before your scheduled appointment.  You need to re-schedule your appointment if you arrive 10 or more minutes late.  We strive to give you quality time with our providers, and arriving late affects you and other patients whose appointments are after yours.  Also, if you no show three or more times for appointments you may be dismissed from the clinic.  Again, thank you for choosing [place] at [place] hope is that these requests will allow you access to exceptional care and in a timely manner. _______________________________________________________________  If you have questions after your visit, please contact our office at [phone] between the hours of 8:30 a.m. and 5:00 p.m. Voicemails left after 4:30 p.m. will not be returned until the following business day. _______________________________________________________________  For prescription refill requests, have your pharmacy contact our office. _______________________________________________________________  Recommendations made by the consultant and any test results will be sent to your referring physician. _______________________________________________________________

## 2017-03-18 NOTE — Progress Notes (Signed)
Patient tolerated vitamin b12 shot with no complaints voiced.  Site clean and dry with no bruising or swelling noted.  Band aid applied.  VSS with discharge. Left ambulatory.

## 2017-04-14 ENCOUNTER — Other Ambulatory Visit (HOSPITAL_COMMUNITY): Payer: PPO

## 2017-04-15 ENCOUNTER — Ambulatory Visit (HOSPITAL_COMMUNITY)
Admission: RE | Admit: 2017-04-15 | Discharge: 2017-04-15 | Disposition: A | Discharge status: Home / Self Care | Payer: PPO | Source: Ambulatory Visit | Attending: Oncology | Admitting: Oncology

## 2017-04-15 DIAGNOSIS — Z780 Asymptomatic menopausal state: Secondary | ICD-10-CM | POA: Insufficient documentation

## 2017-04-15 DIAGNOSIS — M810 Age-related osteoporosis without current pathological fracture: Secondary | ICD-10-CM | POA: Diagnosis not present

## 2017-04-16 ENCOUNTER — Encounter (HOSPITAL_COMMUNITY): Payer: PPO

## 2017-04-16 ENCOUNTER — Encounter (HOSPITAL_COMMUNITY): Payer: Self-pay

## 2017-04-16 ENCOUNTER — Encounter (HOSPITAL_COMMUNITY): Payer: PPO | Attending: Hematology and Oncology

## 2017-04-16 VITALS — BP 121/61 | HR 79 | Temp 98.4°F | Resp 18

## 2017-04-16 DIAGNOSIS — E538 Deficiency of other specified B group vitamins: Secondary | ICD-10-CM | POA: Diagnosis not present

## 2017-04-16 DIAGNOSIS — M810 Age-related osteoporosis without current pathological fracture: Secondary | ICD-10-CM | POA: Insufficient documentation

## 2017-04-16 DIAGNOSIS — C50411 Malignant neoplasm of upper-outer quadrant of right female breast: Secondary | ICD-10-CM | POA: Insufficient documentation

## 2017-04-16 DIAGNOSIS — Z87891 Personal history of nicotine dependence: Secondary | ICD-10-CM | POA: Insufficient documentation

## 2017-04-16 DIAGNOSIS — Z170 Estrogen receptor positive status [ER+]: Secondary | ICD-10-CM | POA: Insufficient documentation

## 2017-04-16 DIAGNOSIS — Z780 Asymptomatic menopausal state: Secondary | ICD-10-CM | POA: Insufficient documentation

## 2017-04-16 DIAGNOSIS — Z23 Encounter for immunization: Secondary | ICD-10-CM

## 2017-04-16 DIAGNOSIS — D509 Iron deficiency anemia, unspecified: Secondary | ICD-10-CM

## 2017-04-16 DIAGNOSIS — I10 Essential (primary) hypertension: Secondary | ICD-10-CM | POA: Insufficient documentation

## 2017-04-16 MED ORDER — CYANOCOBALAMIN 1000 MCG/ML IJ SOLN
INTRAMUSCULAR | Status: AC
Start: 2017-04-16 — End: ?
  Filled 2017-04-16: qty 1

## 2017-04-16 MED ORDER — CYANOCOBALAMIN 1000 MCG/ML IJ SOLN
1000.0000 ug | Freq: Once | INTRAMUSCULAR | Status: AC
  Administered 2017-04-16: 1000 ug via INTRAMUSCULAR

## 2017-04-16 MED ORDER — INFLUENZA VAC SPLIT QUAD 0.5 ML IM SUSY
PREFILLED_SYRINGE | INTRAMUSCULAR | Status: AC
  Filled 2017-04-16: qty 0.5

## 2017-04-16 MED ORDER — INFLUENZA VAC SPLIT QUAD 0.5 ML IM SUSY
0.5000 mL | PREFILLED_SYRINGE | Freq: Once | INTRAMUSCULAR | Status: AC
  Administered 2017-04-16: 0.5 mL via INTRAMUSCULAR

## 2017-04-16 NOTE — Patient Instructions (Signed)
[place] at [facility] Discharge Instructions  RECOMMENDATIONS MADE BY THE CONSULTANT AND ANY TEST RESULTS WILL BE SENT TO YOUR REFERRING PHYSICIAN.  Received Vit B12 and Influenza vaccine today. Follow-up as scheduled. Call clinic for any questions or concerns  Thank you for choosing [place] at [facility] to provide your oncology and hematology care.  To afford each patient quality time with our provider, please arrive at least 15 minutes before your scheduled appointment time.    If you have a lab appointment with the [place] please come in thru the  Main Entrance and check in at the main information desk  You need to re-schedule your appointment should you arrive 10 or more minutes late.  We strive to give you quality time with our providers, and arriving late affects you and other patients whose appointments are after yours.  Also, if you no show three or more times for appointments you may be dismissed from the clinic at the providers discretion.     Again, thank you for choosing [facility].  Our hope is that these requests will decrease the amount of time that you wait before being seen by our physicians.       _____________________________________________________________  Should you have questions after your visit to [facility], please contact our office at [phone] between the hours of 8:30 a.m. and 4:30 p.m.  Voicemails left after 4:30 p.m. will not be returned until the following business day.  For prescription refill requests, have your pharmacy contact our office.       Resources For Cancer Patients and their Caregivers ? American Cancer Society: Can assist with transportation, wigs, general needs, runs Look Good Feel Better.        936-809-9626 ? Cancer Care: Provides financial assistance, online support groups, medication/co-pay assistance.  1-800-813-HOPE (930) 390-1796) ? [place] Assists [place] cancer patients and their families through emotional , educational and financial support.  440-380-1383 ? [place] DSS Where to apply for food stamps, Medicaid and utility assistance. 938-844-5327 ? RCATS: Transportation to medical appointments. (385) 636-3846 ? Social Security Administration: May apply for disability if have a Stage IV cancer. 4693841530 575-239-3076 ? [employer], Disability and Transit Services: Assists with nutrition, care and transit needs. [place] Support Programs: @10RELATIVEDAYS @ > Cancer Support Group  2nd Tuesday of the month 1pm-2pm, Journey Room  > Creative Journey  3rd Tuesday of the month 1130am-1pm, Journey Room  > Look Good Feel Better  1st Wednesday of the month 10am-12 noon, Journey Room (Call [place] to register [phone])

## 2017-04-16 NOTE — Progress Notes (Signed)
Tamara [name] tolerated Vit B12 and Influenza vaccine well without complaints or incident. VSS Pt discharged self ambulatory in satisfactory condition

## 2017-05-11 DIAGNOSIS — N3001 Acute cystitis with hematuria: Secondary | ICD-10-CM | POA: Diagnosis not present

## 2017-05-11 DIAGNOSIS — N390 Urinary tract infection, site not specified: Secondary | ICD-10-CM | POA: Diagnosis not present

## 2017-05-17 ENCOUNTER — Encounter (HOSPITAL_COMMUNITY): Payer: Self-pay

## 2017-05-17 ENCOUNTER — Encounter (HOSPITAL_COMMUNITY): Payer: PPO | Attending: Hematology and Oncology

## 2017-05-17 VITALS — BP 114/59 | HR 88 | Temp 97.7°F | Resp 18

## 2017-05-17 DIAGNOSIS — C50411 Malignant neoplasm of upper-outer quadrant of right female breast: Secondary | ICD-10-CM | POA: Insufficient documentation

## 2017-05-17 DIAGNOSIS — E538 Deficiency of other specified B group vitamins: Secondary | ICD-10-CM | POA: Insufficient documentation

## 2017-05-17 DIAGNOSIS — M810 Age-related osteoporosis without current pathological fracture: Secondary | ICD-10-CM | POA: Insufficient documentation

## 2017-05-17 DIAGNOSIS — Z170 Estrogen receptor positive status [ER+]: Secondary | ICD-10-CM | POA: Insufficient documentation

## 2017-05-17 DIAGNOSIS — Z87891 Personal history of nicotine dependence: Secondary | ICD-10-CM | POA: Insufficient documentation

## 2017-05-17 DIAGNOSIS — Z780 Asymptomatic menopausal state: Secondary | ICD-10-CM | POA: Insufficient documentation

## 2017-05-17 DIAGNOSIS — D509 Iron deficiency anemia, unspecified: Secondary | ICD-10-CM | POA: Insufficient documentation

## 2017-05-17 DIAGNOSIS — I10 Essential (primary) hypertension: Secondary | ICD-10-CM | POA: Insufficient documentation

## 2017-05-17 MED ORDER — CYANOCOBALAMIN 1000 MCG/ML IJ SOLN
INTRAMUSCULAR | Status: AC
  Filled 2017-05-17: qty 1

## 2017-05-17 MED ORDER — CYANOCOBALAMIN 1000 MCG/ML IJ SOLN
1000.0000 ug | Freq: Once | INTRAMUSCULAR | Status: AC
  Administered 2017-05-17: 1000 ug via INTRAMUSCULAR

## 2017-05-17 NOTE — Patient Instructions (Signed)
[place] at [facility] Discharge Instructions  RECOMMENDATIONS MADE BY THE CONSULTANT AND ANY TEST RESULTS WILL BE SENT TO YOUR REFERRING PHYSICIAN.  Received Vit B12 injection today. Follow-up as scheduled. Call clinic for any questions or cocnerns  Thank you for choosing [place] at [facility] to provide your oncology and hematology care.  To afford each patient quality time with our provider, please arrive at least 15 minutes before your scheduled appointment time.    If you have a lab appointment with the [place] please come in thru the  Main Entrance and check in at the main information desk  You need to re-schedule your appointment should you arrive 10 or more minutes late.  We strive to give you quality time with our providers, and arriving late affects you and other patients whose appointments are after yours.  Also, if you no show three or more times for appointments you may be dismissed from the clinic at the providers discretion.     Again, thank you for choosing [facility].  Our hope is that these requests will decrease the amount of time that you wait before being seen by our physicians.       _____________________________________________________________  Should you have questions after your visit to [facility], please contact our office at [phone] between the hours of 8:30 a.m. and 4:30 p.m.  Voicemails left after 4:30 p.m. will not be returned until the following business day.  For prescription refill requests, have your pharmacy contact our office.       Resources For Cancer Patients and their Caregivers ? American Cancer Society: Can assist with transportation, wigs, general needs, runs Look Good Feel Better.        [phone] ? Cancer Care: Provides financial assistance, online support groups, medication/co-pay assistance.  1-800-813-HOPE 715-657-6953) ? [place] Assists [place] cancer patients and their families through emotional , educational and financial support.  386 765 2338 ? [place] DSS Where to apply for food stamps, Medicaid and utility assistance. (712)634-9685 ? RCATS: Transportation to medical appointments. 667-303-9829 ? Social Security Administration: May apply for disability if have a Stage IV cancer. [phone] 479-632-1005 ? [employer], Disability and Transit Services: Assists with nutrition, care and transit needs. [place] Support Programs: @10RELATIVEDAYS @ > Cancer Support Group  2nd Tuesday of the month 1pm-2pm, Journey Room  > Creative Journey  3rd Tuesday of the month 1130am-1pm, Journey Room  > Look Good Feel Better  1st Wednesday of the month 10am-12 noon, Journey Room (Call [place] to register [phone])

## 2017-05-17 NOTE — Progress Notes (Signed)
Tamara [name] tolerated Vit B12 injection today. VSS Pt discharged self ambulatory in satisfactory condition

## 2017-06-07 ENCOUNTER — Other Ambulatory Visit (HOSPITAL_COMMUNITY): Payer: PPO

## 2017-06-07 ENCOUNTER — Ambulatory Visit (HOSPITAL_COMMUNITY): Payer: PPO

## 2017-06-09 DIAGNOSIS — J84112 Idiopathic pulmonary fibrosis: Secondary | ICD-10-CM | POA: Diagnosis not present

## 2017-06-09 DIAGNOSIS — G3184 Mild cognitive impairment, so stated: Secondary | ICD-10-CM | POA: Diagnosis not present

## 2017-06-09 DIAGNOSIS — C50911 Malignant neoplasm of unspecified site of right female breast: Secondary | ICD-10-CM | POA: Diagnosis not present

## 2017-06-09 DIAGNOSIS — I10 Essential (primary) hypertension: Secondary | ICD-10-CM | POA: Diagnosis not present

## 2017-06-13 ENCOUNTER — Other Ambulatory Visit (HOSPITAL_COMMUNITY): Payer: Self-pay | Admitting: Oncology

## 2017-06-13 DIAGNOSIS — Z170 Estrogen receptor positive status [ER+]: Principal | ICD-10-CM

## 2017-06-13 DIAGNOSIS — C50411 Malignant neoplasm of upper-outer quadrant of right female breast: Secondary | ICD-10-CM

## 2017-06-17 ENCOUNTER — Encounter (HOSPITAL_COMMUNITY): Payer: Self-pay | Admitting: Adult Health

## 2017-06-17 ENCOUNTER — Encounter (HOSPITAL_COMMUNITY): Payer: PPO | Attending: Adult Health | Admitting: Adult Health

## 2017-06-17 ENCOUNTER — Encounter (HOSPITAL_COMMUNITY): Payer: PPO

## 2017-06-17 ENCOUNTER — Encounter (HOSPITAL_COMMUNITY): Payer: PPO | Attending: Hematology and Oncology

## 2017-06-17 ENCOUNTER — Other Ambulatory Visit: Payer: Self-pay

## 2017-06-17 VITALS — BP 137/71 | HR 75 | Temp 98.0°F | Resp 16 | Ht 61.0 in | Wt 146.0 lb

## 2017-06-17 DIAGNOSIS — Z08 Encounter for follow-up examination after completed treatment for malignant neoplasm: Secondary | ICD-10-CM | POA: Diagnosis not present

## 2017-06-17 DIAGNOSIS — Z780 Asymptomatic menopausal state: Secondary | ICD-10-CM | POA: Insufficient documentation

## 2017-06-17 DIAGNOSIS — M810 Age-related osteoporosis without current pathological fracture: Secondary | ICD-10-CM

## 2017-06-17 DIAGNOSIS — E538 Deficiency of other specified B group vitamins: Secondary | ICD-10-CM

## 2017-06-17 DIAGNOSIS — Z90710 Acquired absence of both cervix and uterus: Secondary | ICD-10-CM | POA: Insufficient documentation

## 2017-06-17 DIAGNOSIS — Z9889 Other specified postprocedural states: Secondary | ICD-10-CM | POA: Insufficient documentation

## 2017-06-17 DIAGNOSIS — C50411 Malignant neoplasm of upper-outer quadrant of right female breast: Secondary | ICD-10-CM | POA: Diagnosis not present

## 2017-06-17 DIAGNOSIS — I10 Essential (primary) hypertension: Secondary | ICD-10-CM | POA: Diagnosis not present

## 2017-06-17 DIAGNOSIS — Z79899 Other long term (current) drug therapy: Secondary | ICD-10-CM | POA: Diagnosis not present

## 2017-06-17 DIAGNOSIS — Z170 Estrogen receptor positive status [ER+]: Secondary | ICD-10-CM

## 2017-06-17 DIAGNOSIS — Z9221 Personal history of antineoplastic chemotherapy: Secondary | ICD-10-CM | POA: Diagnosis not present

## 2017-06-17 DIAGNOSIS — Z853 Personal history of malignant neoplasm of breast: Secondary | ICD-10-CM | POA: Insufficient documentation

## 2017-06-17 DIAGNOSIS — D509 Iron deficiency anemia, unspecified: Secondary | ICD-10-CM | POA: Diagnosis not present

## 2017-06-17 DIAGNOSIS — Z87891 Personal history of nicotine dependence: Secondary | ICD-10-CM | POA: Insufficient documentation

## 2017-06-17 DIAGNOSIS — Z79811 Long term (current) use of aromatase inhibitors: Secondary | ICD-10-CM | POA: Diagnosis not present

## 2017-06-17 DIAGNOSIS — Z9011 Acquired absence of right breast and nipple: Secondary | ICD-10-CM | POA: Insufficient documentation

## 2017-06-17 LAB — CBC WITH DIFFERENTIAL/PLATELET
Basophils Absolute: 0 10*3/uL (ref 0.0–0.1)
Basophils Relative: 0 %
EOS PCT: 1 %
Eosinophils Absolute: 0.1 10*3/uL (ref 0.0–0.7)
HEMATOCRIT: 38.7 % (ref 36.0–46.0)
Hemoglobin: 12.6 g/dL (ref 12.0–15.0)
LYMPHS ABS: 1.2 10*3/uL (ref 0.7–4.0)
LYMPHS PCT: 16 %
MCH: 31.9 pg (ref 26.0–34.0)
MCHC: 32.6 g/dL (ref 30.0–36.0)
MCV: 98 fL (ref 78.0–100.0)
MONO ABS: 0.9 10*3/uL (ref 0.1–1.0)
MONOS PCT: 12 %
Neutro Abs: 5.3 10*3/uL (ref 1.7–7.7)
Neutrophils Relative %: 71 %
PLATELETS: 201 10*3/uL (ref 150–400)
RBC: 3.95 MIL/uL (ref 3.87–5.11)
RDW: 13.8 % (ref 11.5–15.5)
WBC: 7.4 10*3/uL (ref 4.0–10.5)

## 2017-06-17 LAB — COMPREHENSIVE METABOLIC PANEL
ALT: 13 U/L — ABNORMAL LOW (ref 14–54)
AST: 19 U/L (ref 15–41)
Albumin: 3.9 g/dL (ref 3.5–5.0)
Alkaline Phosphatase: 89 U/L (ref 38–126)
Anion gap: 7 (ref 5–15)
BILIRUBIN TOTAL: 0.7 mg/dL (ref 0.3–1.2)
BUN: 15 mg/dL (ref 6–20)
CO2: 29 mmol/L (ref 22–32)
CREATININE: 0.85 mg/dL (ref 0.44–1.00)
Calcium: 8.9 mg/dL (ref 8.9–10.3)
Chloride: 102 mmol/L (ref 101–111)
Glucose, Bld: 124 mg/dL — ABNORMAL HIGH (ref 65–99)
Potassium: 3.8 mmol/L (ref 3.5–5.1)
Sodium: 138 mmol/L (ref 135–145)
TOTAL PROTEIN: 7.3 g/dL (ref 6.5–8.1)

## 2017-06-17 LAB — IRON AND TIBC
Iron: 86 ug/dL (ref 28–170)
Saturation Ratios: 27 % (ref 10.4–31.8)
TIBC: 316 ug/dL (ref 250–450)
UIBC: 230 ug/dL

## 2017-06-17 LAB — FERRITIN: FERRITIN: 194 ng/mL (ref 11–307)

## 2017-06-17 LAB — VITAMIN B12: VITAMIN B 12: 217 pg/mL (ref 180–914)

## 2017-06-17 LAB — FOLATE: FOLATE: 29 ng/mL (ref >5.9)

## 2017-06-17 MED ORDER — DENOSUMAB 60 MG/ML ~~LOC~~ SOLN
60.0000 mg | Freq: Once | SUBCUTANEOUS | Status: AC
  Administered 2017-06-17: 60 mg via SUBCUTANEOUS
  Filled 2017-06-17: qty 1

## 2017-06-17 MED ORDER — CYANOCOBALAMIN 1000 MCG/ML IJ SOLN
1000.0000 ug | Freq: Once | INTRAMUSCULAR | Status: AC
Start: 2017-06-17 — End: 2017-06-17
  Administered 2017-06-17: 1000 ug via INTRAMUSCULAR

## 2017-06-17 MED ORDER — CYANOCOBALAMIN 1000 MCG/ML IJ SOLN
INTRAMUSCULAR | Status: AC
  Filled 2017-06-17: qty 1

## 2017-06-17 NOTE — Patient Instructions (Addendum)
[place] at [facility] Discharge Instructions  RECOMMENDATIONS MADE BY THE CONSULTANT AND ANY TEST RESULTS WILL BE SENT TO YOUR REFERRING PHYSICIAN.  You were seen today by Mike Craze NP. Your mammogram is due in January 2019. Continue monthly B12 injections.  Continue Prolia every 6 months, next one due in May of 2019. Return in 6 months for labs, injections and follow up.  Thank you for choosing [place] at [facility] to provide your oncology and hematology care.  To afford each patient quality time with our provider, please arrive at least 15 minutes before your scheduled appointment time.    If you have a lab appointment with the [place] please come in thru the  Main Entrance and check in at the main information desk  You need to re-schedule your appointment should you arrive 10 or more minutes late.  We strive to give you quality time with our providers, and arriving late affects you and other patients whose appointments are after yours.  Also, if you no show three or more times for appointments you may be dismissed from the clinic at the providers discretion.     Again, thank you for choosing [facility].  Our hope is that these requests will decrease the amount of time that you wait before being seen by our physicians.       _____________________________________________________________  Should you have questions after your visit to [facility], please contact our office at [phone] between the hours of 8:30 a.m. and 4:30 p.m.  Voicemails left after 4:30 p.m. will not be returned until the following business day.  For prescription refill requests, have your pharmacy contact our office.       Resources For Cancer Patients and their Caregivers ? American Cancer Society: Can assist with transportation, wigs, general needs, runs Look Good Feel Better.        ([phone] ? Cancer  Care: Provides financial assistance, online support groups, medication/co-pay assistance.  1-800-813-HOPE 303-399-0280) ? [place] Assists [place] cancer patients and their families through emotional , educational and financial support.  646-423-0485 ? [place] DSS Where to apply for food stamps, Medicaid and utility assistance. (813)285-5888 ? RCATS: Transportation to medical appointments. 610-306-4279 ? Social Security Administration: May apply for disability if have a Stage IV cancer. 6291781147 [phone] ? [employer], Disability and Transit Services: Assists with nutrition, care and transit needs. [place] Support Programs: @10RELATIVEDAYS @ > Cancer Support Group  2nd Tuesday of the month 1pm-2pm, Journey Room  > Creative Journey  3rd Tuesday of the month 1130am-1pm, Journey Room  > Look Good Feel Better  1st Wednesday of the month 10am-12 noon, Journey Room (Call [place] to register [phone])

## 2017-06-17 NOTE — Progress Notes (Signed)
[place], [place] [address]   CLINIC:  Medical Oncology/Hematology  PCP:  [name], MD [address] 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  Her2 negative, ER 100%, PR 85%, Ki-67 17%.      03/08/2013 Surgery    Right lumpectomy with sentinel node biopsy revealing adjacent DCIS (intermediate grade).  Lymph node is negative for disease.  T1b N0.      03/24/2013 Imaging    Bone density-  Osteoporosis      04/26/2013 Survivorship    Prolia every 6 months      04/28/2013 -  Chemotherapy    Aromasin daily.  She will take for 5 years.      04/08/2015 Imaging    Bone density- There has been a statistically significant IMPROVEMENT compared to patient's previous exam of 03/24/2013.      11/14/2015 Pathology Results    BCI- low risk of late recurrence (year 5-10) at 3.7%, low likelihood of benefit from extended endocrine therapy, low risk of overall recurrence (years 0-10) 6.6%.        HISTORY OF PRESENT ILLNESS:  (From Kirby Crigler, PA-C's last note on 12/15/16)      INTERVAL HISTORY:  Ms. Whitmyer 83 y.o. female returns for routine follow-up for right breast cancer, osteoporosis, vitamin B12 deficiency, and iron deficiency anemia.   Overall, she tells me she has been feeling great. "I'm doing real good to be 83 years old!"  Appetite and energy levels both 100%.  Denies any pain.   Remains on Aromasin and tolerates without  complaints. Denies hot flashes, worsening or new arthralgias, or vaginal dryness.    Her last mammogram was in 01/2017 and was negative for malignancy, but radiologist did recommend repeat imaging 6 months later for likely benign asymmetry in right breast near previous lumpectomy site. She has not noticed any changes in either breast.   Denies any bleeding episodes including blood in her stools, dark/tarry stools, hematuria, vaginal bleeding, nosebleeds, or gingival bleeding.   Due for Prolia and vitamin B12 injections.  She tells me that she struggles with her memory at times; she cannot remember coming to get her monthly vitamin B12 injections (last dose 1 month ago).   For fun, she likes to eat and go to church. She is widowed and has 2 children (1 son and 1 daughter). She has 4 grandchildren and 2 great-grandchildren.      REVIEW OF SYSTEMS:  Review of Systems  Constitutional: Negative.  Negative for chills, fatigue and fever.  HENT:  Negative.  Negative for lump/mass and nosebleeds.   Eyes: Negative.   Respiratory: Negative.  Negative for cough and shortness of breath.   Cardiovascular: Negative.  Negative for chest pain and leg swelling.  Gastrointestinal: Negative.  Negative for abdominal pain, blood in stool, constipation, diarrhea, nausea and vomiting.  Endocrine: Negative.   Genitourinary: Negative.  Negative for dysuria and hematuria.   Musculoskeletal: Negative.  Negative for arthralgias.  Skin:  Negative.  Negative for rash.  Neurological: Negative.  Negative for dizziness and headaches.  Hematological: Negative.  Negative for adenopathy. Does not bruise/bleed easily.  Psychiatric/Behavioral: Negative.  Negative for depression and sleep disturbance. The patient is not nervous/anxious.      PAST MEDICAL/SURGICAL HISTORY:  Past Medical History:  Diagnosis Date  . B12 deficiency 06/08/2016  . Breast cancer ([place])   . Breast cancer, right breast ([facility]) 04/25/2013  . Cancer  ([place])   . Claustrophobia   . Claustrophobia   . Hypertension   . Iron deficiency anemia 12/15/2016  . Malignant neoplasm of upper-outer quadrant of RIGHT female breast ([facility]) 04/25/2013   Stage IA (T1b, N0, M0) invasive mucinous breast carcinoma, S/P right breast lumpectomy by Dr. Arnoldo Morale on 03/08/2013 with 1 negative sentinel node. Cancer was identified as ER+ 100%, PR+ 85%, Her2 negative, and Ki-67 marker at 17%. Oncotype Dx score of 20 with 13% 10 year risk of distant recurrence and absolute benefit of chemotherapy at 10 years in this risk category is negligible and less than 1%.  . Osteoporosis    Past Surgical History:  Procedure Laterality Date  . ABDOMINAL HYSTERECTOMY    . CATARACT EXTRACTION, BILATERAL    . PARTIAL MASTECTOMY WITH NEEDLE LOCALIZATION AND AXILLARY SENTINEL LYMPH NODE BX Right 03/08/2013   Procedure: PARTIAL MASTECTOMY WITH NEEDLE LOCALIZATION AND AXILLARY SENTINEL LYMPH NODE BX;  Surgeon: Jamesetta So, MD;  Location: [facility];  Service: General;  Laterality: Right;  Sentinel Node Bx @ 7:30am Needle Loc @ 8:00am     SOCIAL HISTORY:  Social History   Socioeconomic History  . Marital status: Widowed    Spouse name: Not on file  . Number of children: Not on file  . Years of education: Not on file  . Highest education level: Not on file  Social Needs  . Financial resource strain: Not on file  . Food insecurity - worry: Not on file  . Food insecurity - inability: Not on file  . Transportation needs - medical: Not on file  . Transportation needs - non-medical: Not on file  Occupational History  . Not on file  Tobacco Use  . Smoking status: Former Smoker    Packs/day: 0.50    Years: 40.00    Pack years: 20.00    Types: Cigarettes    Last attempt to quit: 03/03/1967    Years since quitting: 50.3  . Smokeless tobacco: Never Used  Substance and Sexual Activity  . Alcohol use: No  . Drug use: No  . Sexual activity: Yes    Birth control/protection: Surgical    Other Topics Concern  . Not on file  Social History Narrative  . Not on file    FAMILY HISTORY:  Family History  Problem Relation Age of Onset  . Diabetes Mother   . Cancer Father   . Cancer Sister   . Cancer Brother     CURRENT MEDICATIONS:  Outpatient Encounter Medications as of 06/17/2017  Medication Sig  . amLODipine (NORVASC) 5 MG tablet Take 5 mg by mouth daily.  . calcium-vitamin D (OSCAL WITH D) 500-200 MG-UNIT per tablet Take 2 tablets by mouth daily with breakfast.  . donepezil (ARICEPT) 10 MG tablet   . exemestane (AROMASIN) 25 MG tablet TAKE 1 TABLET BY MOUTH ONCE DAILY AFTER BREAKFAST  . losartan (COZAAR) 25 MG tablet   . [EXPIRED] cyanocobalamin ((VITAMIN B-12)) injection 1,000 mcg   . [EXPIRED] denosumab (PROLIA) injection 60 mg    No  facility-administered encounter medications on file as of 06/17/2017.     ALLERGIES:  No Known Allergies   PHYSICAL EXAM:  ECOG Performance status: 0 - Asymptomatic; remains independent   Vitals:   06/17/17 1034  BP: 137/71  Pulse: 75  Resp: 16  Temp: 98 F (36.7 C)  SpO2: 98%   Filed Weights   06/17/17 1034  Weight: 146 lb (66.2 kg)    Physical Exam  Constitutional: She is oriented to person, place, and time and well-developed, well-nourished, and in no distress.  HENT:  Head: Normocephalic.  Mouth/Throat: Oropharynx is clear and moist. No oropharyngeal exudate.  Eyes: Conjunctivae are normal. Pupils are equal, round, and reactive to light. No scleral icterus.  Neck: Normal range of motion. Neck supple.  Cardiovascular: Normal rate and regular rhythm.  Pulmonary/Chest: Effort normal and breath sounds normal. No respiratory distress. She has no wheezes.    Abdominal: Soft. Bowel sounds are normal. There is no tenderness.  Musculoskeletal: Normal range of motion. She exhibits no edema.  Lymphadenopathy:    She has no cervical adenopathy.       Right: No supraclavicular adenopathy present.       Left: No  supraclavicular adenopathy present.  Neurological: She is alert and oriented to person, place, and time. No cranial nerve deficit. Gait normal.  Skin: Skin is warm and dry. No rash noted.  Psychiatric: Mood, memory, affect and judgment normal.  Short-term memory is intermittently poor   Nursing note and vitals reviewed.    LABORATORY DATA:  I have reviewed the labs as listed.  CBC    Component Value Date/Time   WBC 7.4 06/17/2017 0938   RBC 3.95 06/17/2017 0938   HGB 12.6 06/17/2017 0938   HCT 38.7 06/17/2017 0938   PLT 201 06/17/2017 0938   MCV 98.0 06/17/2017 0938   MCH 31.9 06/17/2017 0938   MCHC 32.6 06/17/2017 0938   RDW 13.8 06/17/2017 0938   LYMPHSABS 1.2 06/17/2017 0938   MONOABS 0.9 06/17/2017 0938   EOSABS 0.1 06/17/2017 0938   BASOSABS 0.0 06/17/2017 0938   CMP Latest Ref Rng & Units 06/17/2017 12/07/2016 06/08/2016  Glucose 65 - 99 mg/dL 124(H) 161(H) 157(H)  BUN 6 - 20 mg/dL _0 Creatinine 0.44 - 1.00 mg/dL 0.85 0.86 0.87  Sodium 135 - 145 mmol/L 138 139 139  Potassium 3.5 - 5.1 mmol/L 3.8 4.0 3.6  Chloride 101 - 111 mmol/L 102 104 104  CO2 22 - 32 mmol/L _1 Calcium 8.9 - 10.3 mg/dL 8.9 8.8(L) 8.9  Total Protein 6.5 - 8.1 g/dL 7.3 7.3 7.4  Total Bilirubin 0.3 - 1.2 mg/dL 0.7 0.8 0.6  Alkaline Phos 38 - 126 U/L 89 60 70  AST 15 - 41 U/L _2 ALT 14 - 54 U/L 13(L) 12(L) 13(L)   Results for MARGIE, URBANOWICZ (MRN [id number])   Ref. Range 12/15/2016 11:03  Iron Latest Ref Range: 28 - 170 ug/dL 65  UIBC Latest Units: ug/dL 289  TIBC Latest Ref Range: 250 - 450 ug/dL 354  Saturation Ratios Latest Ref Range: 10.4 - 31.8 % 18  Ferritin Latest Ref Range: 11 - 307 ng/mL 44  Folate Latest Ref Range: >5.9 ng/mL 29.3  Methylmalonic Acid, Quantitative Latest Ref Range: 0 - 378 nmol/L 1,718 (H)  Vitamin B12 Latest Ref Range: 180 - 914 pg/mL >7,500 (H)  Homocysteine Latest Ref Range: 0.0 - 15.0 umol/L 21.7 (H)   PENDING LABS:  Anemia panel  results  pending    DIAGNOSTIC IMAGING:  *The following radiologic images and reports have been reviewed independently and agree with below findings.  Last mammogram: 02/02/17 CLINICAL DATA:  83 year old female with history of right breast cancer post lumpectomy in 2014.  EXAM: 2D DIGITAL DIAGNOSTIC BILATERAL MAMMOGRAM WITH CAD AND ADJUNCT TOMO  COMPARISON:  Previous exam(s).  ACR Breast Density Category b: There are scattered areas of fibroglandular density.  FINDINGS: No suspicious masses or calcifications seen in the left breast. Postsurgical changes are present in the central upper right breast related to prior lumpectomy with a low density asymmetry seen adjacent to the postsurgical change in the central right breast measuring approximately 6-7 mm.  Mammographic images were processed with CAD.  Physical examination of the right breast demonstrates a postsurgical scar in the upper slightly outer right breast. Targeted ultrasound of the entire central right breast was performed with no suspicious masses or abnormalities identified, only expected postoperative changes/scarring seen. This small asymmetry seen mammographically is felt to be related to patient's expected postoperative changes.  IMPRESSION: No mammographic findings of malignancy in either breast. The benign appearing asymmetry in the right breast is felt to be related to expected postoperative change from prior lumpectomy.  RECOMMENDATION: Diagnostic mammography of the right breast in 6 months with possible right breast ultrasound.  I have discussed the findings and recommendations with the patient. Results were also provided in writing at the conclusion of the visit. If applicable, a reminder letter will be sent to the patient regarding the next appointment.  BI-RADS CATEGORY  3: Probably benign.   Electronically Signed   By: Everlean Alstrom M.D.   On: 02/02/2017 11:26   Last DEXA scan:  04/15/17 EXAM: DUAL X-RAY ABSORPTIOMETRY (DXA) FOR BONE MINERAL DENSITY  IMPRESSION: Ordering Physician:  Dr. Baird Cancer,  Your patient Jenille Gitlin completed a BMD test on 04/15/2017 using the [place] (software version: 14.10) manufactured by [employer]. The following summarizes the results of our evaluation. PATIENT BIOGRAPHICAL: Name: AARIKA, MOON Patient ID: [id number] Birth Date: 1934/04/01 Height: 62.0 in. Gender: Female Exam Date: 04/15/2017 Weight: 145.0 lbs. Indications: Bilateral Oophrectomy, Caucasian, Follow up Osteoporosis, Hx Breast Ca, Low Calcium Intake, Post Menopausal Fractures: Treatments: Aromasin DENSITOMETRY RESULTS: Site         Region     Measured Date Measured Age WHO Classification Young Adult T-score BMD         %Change vs. Previous Significant Change (*) DualFemur Neck Left 04/15/2017 80.7 Osteoporosis -2.5 0.687 g/cm2 4.6% - DualFemur Neck Left 6/08/2015 78.7 Osteoporosis -2.7 0.657 g/cm2 11.5% Yes DualFemur Neck Left 03/24/2013 76.7 Osteoporosis -3.2 0.589 g/cm2 -10.6% Yes DualFemur Neck Left 12/17/2010 74.4 Osteoporosis -2.7 0.659 g/cm2 - -  Left Forearm Radius 33% 04/15/2017 80.7 Osteopenia -1.4 0.611 g/cm2 3.5% - Left Forearm Radius 33% 04/08/2015 78.7 Osteopenia -1.7 0.590 g/cm2 -0.4% - Left Forearm Radius 33% 03/24/2013 76.7 Osteopenia -1.7 0.592 g/cm2 - - ASSESSMENT: BMD as determined from Femur Neck Left is 0.687 g/cm2 with a T-Score of -2.5. This patient is considered osteoporotic according to [facility]) criteria. Compared with the prior study on 04/08/2015, the BMD of the lt. femoral neck/lt. forearm show no statistically significant change. (Lumbar spine was not utilized due to advanced degenerative changes.)      PATHOLOGY:  (R) lumpectomy surgical path: 03/08/13            ASSESSMENT & PLAN:   Stage IA invasive mucinous carcinoma  of (R) breast;  ER+/PR+/HER2-:  -Diagnosed in 02/2013. Treated with lumpectomy with SLNB by Dr. Arnoldo Morale on 03/08/13.  Oncotype DX score 20 with 13% risk of distant recurrence; no adjuvant chemo recommended. Also did not have adjuvant radiation therapy.  Started anti-estrogen therapy with Aromasin in 04/2013.  Breast Cancer Index (BCI) testing revealed LOW risk of recurrence and LOW likelihood of benefit of extension of therapy.  -Remains on Aromasin with good tolerance. Will plan to continue through 04/2018, then we can stop given BCI results showing low likelihood of benefit.   -Clinical breast exam performed today and benign.  -Last mammogram done in 01/2017 showed possible (R) breast asymmetry at previous lumpectomy site. Radiologist recommends repeat mammogram 6 months from that time.  (R) diagnostic mammogram and ultrasound placed today to be done sometime in 07/2017 based on these recommendations. If all is normal with this exam, then can continue with annual mammography, which will be due in 01/2018.  -Discussed with her that once she has completed 5 years of anti-estrogen therapy, she can elect to be released from follow-up here at the cancer center at that time and have her PCP perform annual breast exams and ensure she has mammograms done. However, we would love to continue to follow her annually at that time, if she prefers. She will think about this and we can discuss at future follow-up visits.  -Return to cancer center in 6 months for follow-up with labs.    Bone health:  -Last DEXA scan on 04/15/17 revealed osteoporosis with T-score -2.5. She'll be due for biennial imaging in 03/2019; will order at subsequent follow-up visits.  -Serum calcium adequate and she will receive Prolia today per nursing.   -Continue Prolia injections every 6 months.  -Continue calcium/vitamin D supplementation. She remains active; continue weight-bearing activity as tolerated.   Vitamin B12 deficiency:  -Work-up in past revealed  high-normal intrinsic factor antibody, elevated anti-parietal cell antibody suspicious for possible pernicious anemia.  On lifelong vitamin B12 injections.  -Continue monthly vitamin B12 injections.   Iron deficiency anemia:  -Hgb normal today at 12.6 g/dL. She required dose of IV Feraheme in 12/2016.   Iron studies are pending. If she requires additional IV iron, we will contact her to make those arrangements.   -Clinically, she feels well and denies any progressive fatigue or frank bleeding episodes.  Will keep monitoring.          Dispo:  -Continue monthly vitamin B12 injections -(R) breast diagnostic mammogram/ultrasound in 07/2017.  -Continue Prolia inj every 6 months  -Return to cancer center in 6 months for follow-up with labs. (CBC with diff, CMET, and anemia panel)   All questions were answered to patient's stated satisfaction. Encouraged patient to call with any new concerns or questions before her next visit to the cancer center and we can certain see her sooner, if needed.    Plan of care discussed with Dr. Talbert Cage, who agrees with the above aforementioned.    Orders placed this encounter:  Orders Placed This Encounter  Procedures  . MM DIAG BREAST TOMO UNI RIGHT  . US BREAST LTD UNI RIGHT INC AXILLA  . CBC with Differential/Platelet  . Comprehensive metabolic panel  . Vitamin B12  . Folate  . Iron and TIBC  . Ferritin      Mike Craze, NP [place] [phone]

## 2017-06-17 NOTE — Progress Notes (Signed)
Tamara [name] presents today for injection per the provider's orders.  Prolia and B12 administration without incident; see MAR for injection details.  Patient tolerated procedure well and without incident.  No questions or complaints noted at this time. Discharged ambulatory.

## 2017-07-16 ENCOUNTER — Encounter (HOSPITAL_COMMUNITY): Payer: PPO | Attending: Adult Health

## 2017-07-16 ENCOUNTER — Other Ambulatory Visit (HOSPITAL_COMMUNITY): Payer: Self-pay | Admitting: Adult Health

## 2017-07-16 ENCOUNTER — Other Ambulatory Visit: Payer: Self-pay

## 2017-07-16 ENCOUNTER — Encounter (HOSPITAL_COMMUNITY): Payer: Self-pay

## 2017-07-16 VITALS — BP 135/62 | HR 68 | Temp 98.4°F | Resp 16 | Ht 61.0 in | Wt 147.5 lb

## 2017-07-16 DIAGNOSIS — E538 Deficiency of other specified B group vitamins: Secondary | ICD-10-CM | POA: Diagnosis not present

## 2017-07-16 MED ORDER — CYANOCOBALAMIN 1000 MCG/ML IJ SOLN
1000.0000 ug | Freq: Once | INTRAMUSCULAR | Status: AC
  Administered 2017-07-16: 1000 ug via INTRAMUSCULAR
  Filled 2017-07-16: qty 1

## 2017-07-16 NOTE — Progress Notes (Signed)
Pt here today for monthly B12 injection. Pt given injection in left deltoid. Pt tolerated well. Pt stable and discharged home ambulatory.

## 2017-07-16 NOTE — Patient Instructions (Signed)
[place] at [facility] Discharge Instructions  RECOMMENDATIONS MADE BY THE CONSULTANT AND ANY TEST RESULTS WILL BE SENT TO YOUR REFERRING PHYSICIAN.  You received your B-12 injection today, follow up as scheduled.  Thank you for choosing [place] at [facility] to provide your oncology and hematology care.  To afford each patient quality time with our provider, please arrive at least 15 minutes before your scheduled appointment time.    If you have a lab appointment with the [facility] please come in thru the  Main Entrance and check in at the main information desk  You need to re-schedule your appointment should you arrive 10 or more minutes late.  We strive to give you quality time with our providers, and arriving late affects you and other patients whose appointments are after yours.  Also, if you no show three or more times for appointments you may be dismissed from the clinic at the providers discretion.     Again, thank you for choosing [facility].  Our hope is that these requests will decrease the amount of time that you wait before being seen by our physicians.       _____________________________________________________________  Should you have questions after your visit to [facility], please contact our office at [phone] between the hours of 8:30 a.m. and 4:30 p.m.  Voicemails left after 4:30 p.m. will not be returned until the following business day.  For prescription refill requests, have your pharmacy contact our office.       Resources For Cancer Patients and their Caregivers ? American Cancer Society: Can assist with transportation, wigs, general needs, runs Look Good Feel Better.        830-807-6812 ? Cancer Care: Provides financial assistance, online support groups, medication/co-pay assistance.  1-800-813-HOPE 239-675-3643) ? [facility] Assists [place]  cancer patients and their families through emotional , educational and financial support.  330-293-2235 ? [place] DSS Where to apply for food stamps, Medicaid and utility assistance. 240-223-2970 ? [facility]: Transportation to medical appointments. 6576818216 ? Social Security Administration: May apply for disability if have a Stage IV cancer. 9053775798 458-523-7565 ? [employer], Disability and Transit Services: Assists with nutrition, care and transit needs. [place] Support Programs: @10RELATIVEDAYS @ > Cancer Support Group  2nd Tuesday of the month 1pm-2pm, Journey Room  > Creative Journey  3rd Tuesday of the month 1130am-1pm, Journey Room  > Look Good Feel Better  1st Wednesday of the month 10am-12 noon, Journey Room (Call [facility] to register [phone])

## 2017-08-03 ENCOUNTER — Encounter (HOSPITAL_COMMUNITY): Payer: PPO

## 2017-08-16 ENCOUNTER — Encounter (HOSPITAL_COMMUNITY): Payer: Self-pay

## 2017-08-16 ENCOUNTER — Inpatient Hospital Stay (HOSPITAL_COMMUNITY): Payer: PPO | Attending: Internal Medicine

## 2017-08-16 VITALS — BP 111/71 | HR 76 | Temp 98.2°F | Resp 18

## 2017-08-16 DIAGNOSIS — I10 Essential (primary) hypertension: Secondary | ICD-10-CM | POA: Insufficient documentation

## 2017-08-16 DIAGNOSIS — Z9011 Acquired absence of right breast and nipple: Secondary | ICD-10-CM | POA: Diagnosis not present

## 2017-08-16 DIAGNOSIS — Z90710 Acquired absence of both cervix and uterus: Secondary | ICD-10-CM | POA: Insufficient documentation

## 2017-08-16 DIAGNOSIS — Z79899 Other long term (current) drug therapy: Secondary | ICD-10-CM | POA: Diagnosis not present

## 2017-08-16 DIAGNOSIS — Z87891 Personal history of nicotine dependence: Secondary | ICD-10-CM | POA: Diagnosis not present

## 2017-08-16 DIAGNOSIS — M810 Age-related osteoporosis without current pathological fracture: Secondary | ICD-10-CM | POA: Insufficient documentation

## 2017-08-16 DIAGNOSIS — Z9221 Personal history of antineoplastic chemotherapy: Secondary | ICD-10-CM | POA: Diagnosis not present

## 2017-08-16 DIAGNOSIS — Z853 Personal history of malignant neoplasm of breast: Secondary | ICD-10-CM | POA: Insufficient documentation

## 2017-08-16 DIAGNOSIS — D509 Iron deficiency anemia, unspecified: Secondary | ICD-10-CM | POA: Insufficient documentation

## 2017-08-16 DIAGNOSIS — E538 Deficiency of other specified B group vitamins: Secondary | ICD-10-CM | POA: Diagnosis not present

## 2017-08-16 MED ORDER — CYANOCOBALAMIN 1000 MCG/ML IJ SOLN
1000.0000 ug | Freq: Once | INTRAMUSCULAR | Status: AC
  Administered 2017-08-16: 1000 ug via INTRAMUSCULAR

## 2017-08-16 MED ORDER — CYANOCOBALAMIN 1000 MCG/ML IJ SOLN
INTRAMUSCULAR | Status: AC
  Filled 2017-08-16: qty 1

## 2017-08-16 NOTE — Patient Instructions (Signed)
[facility] at [facility] Discharge Instructions  RECOMMENDATIONS MADE BY THE CONSULTANT AND ANY TEST RESULTS WILL BE SENT TO YOUR REFERRING PHYSICIAN.  Received Vit B12 injection today. Follow-up as scheduled. Call clinic for any questions or concerns  Thank you for choosing [facility] at [facility] to provide your oncology and hematology care.  To afford each patient quality time with our provider, please arrive at least 15 minutes before your scheduled appointment time.    If you have a lab appointment with the Cancer Center please come in thru the  Main Entrance and check in at the main information desk  You need to re-schedule your appointment should you arrive 10 or more minutes late.  We strive to give you quality time with our providers, and arriving late affects you and other patients whose appointments are after yours.  Also, if you no show three or more times for appointments you may be dismissed from the clinic at the providers discretion.     Again, thank you for choosing [facility].  Our hope is that these requests will decrease the amount of time that you wait before being seen by our physicians.       _____________________________________________________________  Should you have questions after your visit to [facility], please contact our office at [phone] between the hours of 8:30 a.m. and 4:30 p.m.  Voicemails left after 4:30 p.m. will not be returned until the following business day.  For prescription refill requests, have your pharmacy contact our office.       Resources For Cancer Patients and their Caregivers ? American Cancer Society: Can assist with transportation, wigs, general needs, runs Look Good Feel Better.        1-888-227-6333 ? Cancer Care: Provides financial assistance, online support groups, medication/co-pay assistance.  1-800-813-HOPE (4673) ? [facility] Assists [place] cancer patients and their families through emotional , educational and financial support.  336-427-4357 ? [place] DSS Where to apply for food stamps, Medicaid and utility assistance. 336-342-1394 ? [facility]: Transportation to medical appointments. 336-347-2287 ? Social Security Administration: May apply for disability if have a Stage IV cancer. 336-342-7796 1-800-772-1213 ? [place] [facility], Disability and Transit Services: Assists with nutrition, care and transit needs. 336-349-2343  Cancer Center Support Programs: @10RELATIVEDAYS@ > Cancer Support Group  2nd Tuesday of the month 1pm-2pm, Journey Room  > Creative Journey  3rd Tuesday of the month 1130am-1pm, Journey Room  > Look Good Feel Better  1st Wednesday of the month 10am-12 noon, Journey Room (Call American Cancer Society to register 1-800-395-5775)   

## 2017-08-16 NOTE — Progress Notes (Signed)
Tamara [name] tolerated Vit B12 injection well without complaints or incident. VSS Pt discharged self ambulatory in satisfactory condition 

## 2017-08-17 ENCOUNTER — Ambulatory Visit (HOSPITAL_COMMUNITY)
Admission: RE | Admit: 2017-08-17 | Discharge: 2017-08-17 | Disposition: A | Discharge status: Home / Self Care | Payer: PPO | Source: Ambulatory Visit | Attending: Adult Health | Admitting: Adult Health

## 2017-08-17 DIAGNOSIS — Z170 Estrogen receptor positive status [ER+]: Secondary | ICD-10-CM

## 2017-08-17 DIAGNOSIS — N6489 Other specified disorders of breast: Secondary | ICD-10-CM | POA: Insufficient documentation

## 2017-08-17 DIAGNOSIS — Z853 Personal history of malignant neoplasm of breast: Secondary | ICD-10-CM | POA: Diagnosis not present

## 2017-08-17 DIAGNOSIS — R928 Other abnormal and inconclusive findings on diagnostic imaging of breast: Secondary | ICD-10-CM | POA: Diagnosis not present

## 2017-08-17 DIAGNOSIS — C50411 Malignant neoplasm of upper-outer quadrant of right female breast: Secondary | ICD-10-CM

## 2017-09-14 DIAGNOSIS — E119 Type 2 diabetes mellitus without complications: Secondary | ICD-10-CM | POA: Diagnosis not present

## 2017-09-16 ENCOUNTER — Inpatient Hospital Stay (HOSPITAL_COMMUNITY): Payer: PPO | Attending: Internal Medicine

## 2017-09-16 ENCOUNTER — Encounter (HOSPITAL_COMMUNITY): Payer: Self-pay

## 2017-09-16 VITALS — BP 127/69 | HR 73 | Temp 98.5°F | Resp 18

## 2017-09-16 DIAGNOSIS — I10 Essential (primary) hypertension: Secondary | ICD-10-CM | POA: Diagnosis not present

## 2017-09-16 DIAGNOSIS — Z9221 Personal history of antineoplastic chemotherapy: Secondary | ICD-10-CM | POA: Diagnosis not present

## 2017-09-16 DIAGNOSIS — Z853 Personal history of malignant neoplasm of breast: Secondary | ICD-10-CM | POA: Insufficient documentation

## 2017-09-16 DIAGNOSIS — Z90710 Acquired absence of both cervix and uterus: Secondary | ICD-10-CM | POA: Diagnosis not present

## 2017-09-16 DIAGNOSIS — Z87891 Personal history of nicotine dependence: Secondary | ICD-10-CM | POA: Diagnosis not present

## 2017-09-16 DIAGNOSIS — E538 Deficiency of other specified B group vitamins: Secondary | ICD-10-CM | POA: Diagnosis not present

## 2017-09-16 DIAGNOSIS — Z9011 Acquired absence of right breast and nipple: Secondary | ICD-10-CM | POA: Diagnosis not present

## 2017-09-16 DIAGNOSIS — Z79899 Other long term (current) drug therapy: Secondary | ICD-10-CM | POA: Insufficient documentation

## 2017-09-16 DIAGNOSIS — D509 Iron deficiency anemia, unspecified: Secondary | ICD-10-CM | POA: Insufficient documentation

## 2017-09-16 DIAGNOSIS — M810 Age-related osteoporosis without current pathological fracture: Secondary | ICD-10-CM | POA: Insufficient documentation

## 2017-09-16 MED ORDER — CYANOCOBALAMIN 1000 MCG/ML IJ SOLN
1000.0000 ug | Freq: Once | INTRAMUSCULAR | Status: AC
  Administered 2017-09-16: 1000 ug via INTRAMUSCULAR
  Filled 2017-09-16: qty 1

## 2017-09-16 NOTE — Patient Instructions (Signed)
[facility] at [facility]  Discharge Instructions:  You received a b12 shot today.  _______________________________________________________________  Thank you for choosing [facility] at [facility] to provide your oncology and hematology care.  To afford each patient quality time with our providers, please arrive at least 15 minutes before your scheduled appointment.  You need to re-schedule your appointment if you arrive 10 or more minutes late.  We strive to give you quality time with our providers, and arriving late affects you and other patients whose appointments are after yours.  Also, if you no show three or more times for appointments you may be dismissed from the clinic.  Again, thank you for choosing [facility] at [facility]. Our hope is that these requests will allow you access to exceptional care and in a timely manner. _______________________________________________________________  If you have questions after your visit, please contact our office at [phone] between the hours of 8:30 a.m. and 5:00 p.m. Voicemails left after 4:30 p.m. will not be returned until the following business day. _______________________________________________________________  For prescription refill requests, have your pharmacy contact our office. _______________________________________________________________  Recommendations made by the consultant and any test results will be sent to your referring physician. _______________________________________________________________ 

## 2017-09-16 NOTE — Progress Notes (Signed)
Patient tolerated b12 shot with no complaints voiced.  Injection site clean and dry with no bruising or swelling noted at site.  Band aid applied.  VSS with discharge and left ambulatory with no s/s of distress noted.   

## 2017-09-21 DIAGNOSIS — Z6828 Body mass index (BMI) 28.0-28.9, adult: Secondary | ICD-10-CM | POA: Diagnosis not present

## 2017-09-21 DIAGNOSIS — J84112 Idiopathic pulmonary fibrosis: Secondary | ICD-10-CM | POA: Diagnosis not present

## 2017-09-21 DIAGNOSIS — E1129 Type 2 diabetes mellitus with other diabetic kidney complication: Secondary | ICD-10-CM | POA: Diagnosis not present

## 2017-09-21 DIAGNOSIS — I10 Essential (primary) hypertension: Secondary | ICD-10-CM | POA: Diagnosis not present

## 2017-10-14 ENCOUNTER — Inpatient Hospital Stay (HOSPITAL_COMMUNITY): Payer: PPO | Attending: Internal Medicine

## 2017-10-14 ENCOUNTER — Other Ambulatory Visit: Payer: Self-pay

## 2017-10-14 VITALS — BP 122/59 | HR 82 | Temp 97.7°F | Resp 18

## 2017-10-14 DIAGNOSIS — E538 Deficiency of other specified B group vitamins: Secondary | ICD-10-CM | POA: Diagnosis not present

## 2017-10-14 DIAGNOSIS — Z9011 Acquired absence of right breast and nipple: Secondary | ICD-10-CM | POA: Diagnosis not present

## 2017-10-14 DIAGNOSIS — Z79899 Other long term (current) drug therapy: Secondary | ICD-10-CM | POA: Diagnosis not present

## 2017-10-14 DIAGNOSIS — Z853 Personal history of malignant neoplasm of breast: Secondary | ICD-10-CM | POA: Diagnosis not present

## 2017-10-14 DIAGNOSIS — Z9221 Personal history of antineoplastic chemotherapy: Secondary | ICD-10-CM | POA: Diagnosis not present

## 2017-10-14 DIAGNOSIS — D509 Iron deficiency anemia, unspecified: Secondary | ICD-10-CM | POA: Diagnosis not present

## 2017-10-14 MED ORDER — CYANOCOBALAMIN 1000 MCG/ML IJ SOLN
1000.0000 ug | Freq: Once | INTRAMUSCULAR | Status: AC
  Administered 2017-10-14: 1000 ug via INTRAMUSCULAR

## 2017-10-14 NOTE — Progress Notes (Signed)
Tamara [name] presents today for injection per the provider's orders.  B12 administration without incident; see MAR for injection details.  Patient tolerated procedure well and without incident.  No questions or complaints noted at this time.  Discharged ambulatory.

## 2017-11-12 ENCOUNTER — Encounter (HOSPITAL_COMMUNITY): Payer: Self-pay

## 2017-11-12 ENCOUNTER — Inpatient Hospital Stay (HOSPITAL_COMMUNITY): Payer: PPO | Attending: Internal Medicine

## 2017-11-12 VITALS — BP 120/63 | HR 80 | Temp 97.4°F | Resp 18

## 2017-11-12 DIAGNOSIS — D509 Iron deficiency anemia, unspecified: Secondary | ICD-10-CM | POA: Insufficient documentation

## 2017-11-12 DIAGNOSIS — E538 Deficiency of other specified B group vitamins: Secondary | ICD-10-CM | POA: Insufficient documentation

## 2017-11-12 DIAGNOSIS — Z9221 Personal history of antineoplastic chemotherapy: Secondary | ICD-10-CM | POA: Diagnosis not present

## 2017-11-12 DIAGNOSIS — Z853 Personal history of malignant neoplasm of breast: Secondary | ICD-10-CM | POA: Insufficient documentation

## 2017-11-12 DIAGNOSIS — Z79899 Other long term (current) drug therapy: Secondary | ICD-10-CM | POA: Insufficient documentation

## 2017-11-12 DIAGNOSIS — Z9011 Acquired absence of right breast and nipple: Secondary | ICD-10-CM | POA: Diagnosis not present

## 2017-11-12 MED ORDER — CYANOCOBALAMIN 1000 MCG/ML IJ SOLN
1000.0000 ug | Freq: Once | INTRAMUSCULAR | Status: AC
  Administered 2017-11-12: 1000 ug via INTRAMUSCULAR
  Filled 2017-11-12: qty 1

## 2017-11-12 NOTE — Progress Notes (Signed)
Patient tolerated b12 injection with no complaints voiced.  Injection site clean and dry with no bruising or swelling noted at site.  Band aid applied.  VSS with discharge and left ambulatory with no s/s of distress noted.

## 2017-11-12 NOTE — Patient Instructions (Signed)
[facility] at [facility]  Discharge Instructions:  You received a b12 shot today.  _______________________________________________________________  Thank you for choosing [facility] at [facility] to provide your oncology and hematology care.  To afford each patient quality time with our providers, please arrive at least 15 minutes before your scheduled appointment.  You need to re-schedule your appointment if you arrive 10 or more minutes late.  We strive to give you quality time with our providers, and arriving late affects you and other patients whose appointments are after yours.  Also, if you no show three or more times for appointments you may be dismissed from the clinic.  Again, thank you for choosing [facility] at [facility]. Our hope is that these requests will allow you access to exceptional care and in a timely manner. _______________________________________________________________  If you have questions after your visit, please contact our office at [phone] between the hours of 8:30 a.m. and 5:00 p.m. Voicemails left after 4:30 p.m. will not be returned until the following business day. _______________________________________________________________  For prescription refill requests, have your pharmacy contact our office. _______________________________________________________________  Recommendations made by the consultant and any test results will be sent to your referring physician. _______________________________________________________________ 

## 2017-12-13 ENCOUNTER — Other Ambulatory Visit (HOSPITAL_COMMUNITY): Payer: Self-pay | Admitting: Adult Health

## 2017-12-13 DIAGNOSIS — Z170 Estrogen receptor positive status [ER+]: Principal | ICD-10-CM

## 2017-12-13 DIAGNOSIS — C50411 Malignant neoplasm of upper-outer quadrant of right female breast: Secondary | ICD-10-CM

## 2017-12-15 ENCOUNTER — Ambulatory Visit (HOSPITAL_COMMUNITY): Payer: PPO | Admitting: Hematology

## 2017-12-15 ENCOUNTER — Encounter (HOSPITAL_COMMUNITY): Payer: Self-pay

## 2017-12-15 ENCOUNTER — Inpatient Hospital Stay (HOSPITAL_COMMUNITY): Payer: PPO

## 2017-12-15 ENCOUNTER — Inpatient Hospital Stay (HOSPITAL_COMMUNITY): Payer: PPO | Attending: Hematology

## 2017-12-15 ENCOUNTER — Other Ambulatory Visit (HOSPITAL_COMMUNITY): Payer: Self-pay | Admitting: Pharmacist

## 2017-12-15 ENCOUNTER — Other Ambulatory Visit: Payer: Self-pay

## 2017-12-15 VITALS — BP 142/63 | HR 69 | Temp 98.5°F | Resp 16

## 2017-12-15 DIAGNOSIS — Z9221 Personal history of antineoplastic chemotherapy: Secondary | ICD-10-CM | POA: Diagnosis not present

## 2017-12-15 DIAGNOSIS — D509 Iron deficiency anemia, unspecified: Secondary | ICD-10-CM | POA: Insufficient documentation

## 2017-12-15 DIAGNOSIS — C50411 Malignant neoplasm of upper-outer quadrant of right female breast: Secondary | ICD-10-CM

## 2017-12-15 DIAGNOSIS — Z853 Personal history of malignant neoplasm of breast: Secondary | ICD-10-CM | POA: Insufficient documentation

## 2017-12-15 DIAGNOSIS — E538 Deficiency of other specified B group vitamins: Secondary | ICD-10-CM | POA: Insufficient documentation

## 2017-12-15 DIAGNOSIS — Z9011 Acquired absence of right breast and nipple: Secondary | ICD-10-CM | POA: Diagnosis not present

## 2017-12-15 DIAGNOSIS — Z79899 Other long term (current) drug therapy: Secondary | ICD-10-CM | POA: Diagnosis not present

## 2017-12-15 DIAGNOSIS — Z170 Estrogen receptor positive status [ER+]: Secondary | ICD-10-CM

## 2017-12-15 DIAGNOSIS — M810 Age-related osteoporosis without current pathological fracture: Secondary | ICD-10-CM

## 2017-12-15 LAB — COMPREHENSIVE METABOLIC PANEL
ALBUMIN: 3.9 g/dL (ref 3.5–5.0)
ALK PHOS: 68 U/L (ref 38–126)
ALT: 13 U/L — ABNORMAL LOW (ref 14–54)
AST: 17 U/L (ref 15–41)
Anion gap: 8 (ref 5–15)
BILIRUBIN TOTAL: 0.9 mg/dL (ref 0.3–1.2)
BUN: 16 mg/dL (ref 6–20)
CALCIUM: 8.8 mg/dL — AB (ref 8.9–10.3)
CO2: 28 mmol/L (ref 22–32)
Chloride: 102 mmol/L (ref 101–111)
Creatinine, Ser: 0.89 mg/dL (ref 0.44–1.00)
GFR calc Af Amer: >60 mL/min (ref >60)
GFR calc non Af Amer: 59 mL/min — ABNORMAL LOW (ref >60)
Glucose, Bld: 125 mg/dL — ABNORMAL HIGH (ref 65–99)
Potassium: 3.7 mmol/L (ref 3.5–5.1)
SODIUM: 138 mmol/L (ref 135–145)
TOTAL PROTEIN: 7.5 g/dL (ref 6.5–8.1)

## 2017-12-15 LAB — CBC WITH DIFFERENTIAL/PLATELET
BASOS ABS: 0 10*3/uL (ref 0.0–0.1)
BASOS PCT: 0 %
EOS ABS: 0.1 10*3/uL (ref 0.0–0.7)
Eosinophils Relative: 1 %
HEMATOCRIT: 38.4 % (ref 36.0–46.0)
HEMOGLOBIN: 12.7 g/dL (ref 12.0–15.0)
Lymphocytes Relative: 20 %
Lymphs Abs: 1.2 10*3/uL (ref 0.7–4.0)
MCH: 31.3 pg (ref 26.0–34.0)
MCHC: 33.1 g/dL (ref 30.0–36.0)
MCV: 94.6 fL (ref 78.0–100.0)
Monocytes Absolute: 0.7 10*3/uL (ref 0.1–1.0)
Monocytes Relative: 12 %
NEUTROS ABS: 4.1 10*3/uL (ref 1.7–7.7)
NEUTROS PCT: 67 %
Platelets: 187 10*3/uL (ref 150–400)
RBC: 4.06 MIL/uL (ref 3.87–5.11)
RDW: 13.5 % (ref 11.5–15.5)
WBC: 6.1 10*3/uL (ref 4.0–10.5)

## 2017-12-15 LAB — FOLATE: Folate: 28 ng/mL (ref >5.9)

## 2017-12-15 LAB — IRON AND TIBC
IRON: 107 ug/dL (ref 28–170)
Saturation Ratios: 33 % — ABNORMAL HIGH (ref 10.4–31.8)
TIBC: 322 ug/dL (ref 250–450)
UIBC: 215 ug/dL

## 2017-12-15 LAB — FERRITIN: Ferritin: 197 ng/mL (ref 11–307)

## 2017-12-15 LAB — VITAMIN B12: Vitamin B-12: 322 pg/mL (ref 180–914)

## 2017-12-15 MED ORDER — DENOSUMAB 60 MG/ML ~~LOC~~ SOLN
60.0000 mg | Freq: Once | SUBCUTANEOUS | Status: DC
  Filled 2017-12-15: qty 1

## 2017-12-15 MED ORDER — DENOSUMAB 60 MG/ML ~~LOC~~ SOSY
60.0000 mg | PREFILLED_SYRINGE | Freq: Once | SUBCUTANEOUS | Status: AC
  Administered 2017-12-15: 60 mg via SUBCUTANEOUS
  Filled 2017-12-15: qty 1

## 2017-12-15 MED ORDER — CYANOCOBALAMIN 1000 MCG/ML IJ SOLN
1000.0000 ug | Freq: Once | INTRAMUSCULAR | Status: AC
  Administered 2017-12-15: 1000 ug via INTRAMUSCULAR
  Filled 2017-12-15: qty 1

## 2017-12-15 NOTE — Progress Notes (Signed)
Ca++ reviewed with Dr. Walden Field - okay to give Prolia today per MD.  She will see her for f/u on 12/23/17.   Per pt, she is not taking any calcium supplementation at this time.  Instructed pt to pick up and take calcium 1200 mg + D daily.   Versie Starks presents today for injection per the provider's orders.  Vitamin B12 and Prolia administrations without incident; see MAR for injection details.  Patient tolerated procedure well and without incident.  No questions or complaints noted at this time.  Discharged ambulatory.

## 2017-12-21 DIAGNOSIS — G3184 Mild cognitive impairment, so stated: Secondary | ICD-10-CM | POA: Diagnosis not present

## 2017-12-21 DIAGNOSIS — E1122 Type 2 diabetes mellitus with diabetic chronic kidney disease: Secondary | ICD-10-CM | POA: Diagnosis not present

## 2017-12-21 DIAGNOSIS — I10 Essential (primary) hypertension: Secondary | ICD-10-CM | POA: Diagnosis not present

## 2017-12-21 DIAGNOSIS — J84112 Idiopathic pulmonary fibrosis: Secondary | ICD-10-CM | POA: Diagnosis not present

## 2017-12-23 ENCOUNTER — Encounter (HOSPITAL_COMMUNITY): Payer: Self-pay | Admitting: Internal Medicine

## 2017-12-23 ENCOUNTER — Inpatient Hospital Stay (HOSPITAL_COMMUNITY): Payer: PPO | Attending: Internal Medicine | Admitting: Internal Medicine

## 2017-12-23 ENCOUNTER — Other Ambulatory Visit: Payer: Self-pay

## 2017-12-23 VITALS — BP 115/66 | HR 70 | Temp 98.4°F | Resp 16 | Wt 144.6 lb

## 2017-12-23 DIAGNOSIS — I10 Essential (primary) hypertension: Secondary | ICD-10-CM | POA: Diagnosis not present

## 2017-12-23 DIAGNOSIS — Z79811 Long term (current) use of aromatase inhibitors: Secondary | ICD-10-CM | POA: Insufficient documentation

## 2017-12-23 DIAGNOSIS — E538 Deficiency of other specified B group vitamins: Secondary | ICD-10-CM | POA: Diagnosis not present

## 2017-12-23 DIAGNOSIS — D509 Iron deficiency anemia, unspecified: Secondary | ICD-10-CM | POA: Insufficient documentation

## 2017-12-23 DIAGNOSIS — Z9842 Cataract extraction status, left eye: Secondary | ICD-10-CM | POA: Diagnosis not present

## 2017-12-23 DIAGNOSIS — Z79899 Other long term (current) drug therapy: Secondary | ICD-10-CM | POA: Diagnosis not present

## 2017-12-23 DIAGNOSIS — Z170 Estrogen receptor positive status [ER+]: Secondary | ICD-10-CM | POA: Diagnosis not present

## 2017-12-23 DIAGNOSIS — C50411 Malignant neoplasm of upper-outer quadrant of right female breast: Secondary | ICD-10-CM | POA: Diagnosis not present

## 2017-12-23 DIAGNOSIS — Z87891 Personal history of nicotine dependence: Secondary | ICD-10-CM | POA: Insufficient documentation

## 2017-12-23 NOTE — Patient Instructions (Signed)
[place] at [facility] Discharge Instructions  You saw Dr. Walden Field today. Start wearing your support stockings again. Let us know if you want to have your legs checked for clots. (doppler)   Thank you for choosing [place] at [facility] to provide your oncology and hematology care.  To afford each patient quality time with our provider, please arrive at least 15 minutes before your scheduled appointment time.   If you have a lab appointment with the [facility] please come in thru the  Main Entrance and check in at the main information desk  You need to re-schedule your appointment should you arrive 10 or more minutes late.  We strive to give you quality time with our providers, and arriving late affects you and other patients whose appointments are after yours.  Also, if you no show three or more times for appointments you may be dismissed from the clinic at the providers discretion.     Again, thank you for choosing [facility].  Our hope is that these requests will decrease the amount of time that you wait before being seen by our physicians.       _____________________________________________________________  Should you have questions after your visit to [facility], please contact our office at [phone] between the hours of 8:30 a.m. and 4:30 p.m.  Voicemails left after 4:30 p.m. will not be returned until the following business day.  For prescription refill requests, have your pharmacy contact our office.       Resources For Cancer Patients and their Caregivers ? American Cancer Society: Can assist with transportation, wigs, general needs, runs Look Good Feel Better.        785-192-6278 ? Cancer Care: Provides financial assistance, online support groups, medication/co-pay assistance.  1-800-813-HOPE (708)319-3157) ? [place] Assists [place] cancer patients and their  families through emotional , educational and financial support.  478-023-2790 ? [place] DSS Where to apply for food stamps, Medicaid and utility assistance. 775-663-0325 ? [facility]: Transportation to medical appointments. (215) 833-6830 ? Social Security Administration: May apply for disability if have a Stage IV cancer. 510-620-5611 (270) 881-2540 ? [employer], Disability and Transit Services: Assists with nutrition, care and transit needs. [place] Support Programs:   > Cancer Support Group  2nd Tuesday of the month 1pm-2pm, Journey Room   > Creative Journey  3rd Tuesday of the month 1130am-1pm, Journey Room

## 2017-12-23 NOTE — Progress Notes (Signed)
Diagnosis Malignant neoplasm of upper-outer quadrant of right breast in female, estrogen receptor positive (Tamara [name]) - Plan: CBC with Differential/Platelet, Comprehensive metabolic panel, Lactate dehydrogenase, Ferritin, Vitamin B12, MM DIAG BREAST TOMO BILATERAL  Staging Cancer Staging Malignant neoplasm of upper-outer quadrant of RIGHT female breast (Tamara [name]) Staging form: Breast, AJCC 7th Edition - Clinical: Stage IA (T1b, N0, cM0) - Signed by B[facility], PA-C on 04/25/2013   Assessment and Plan:  1.  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  Pt was diagnosed in 02/2013. Treated with lumpectomy with SLNB by Dr. Arnoldo Morale on 03/08/13.  Oncotype DX score 20 with 13% risk of distant recurrence; no adjuvant chemo recommended. Also did not have adjuvant radiation therapy.  Started anti-estrogen therapy with Aromasin in 04/2013.  Breast Cancer Index (BCI) testing revealed LOW risk of recurrence and LOW likelihood of benefit of extension of therapy. She remains on Aromasin  Planned to complete therapy in 04/2018, based on BCI results showing low likelihood of benefit.    Pt had mammogram done 07/2017 that showed right breast asymmetry at lumpectomy site and she was recommended for bilateral diagnostic mammogram in 12/2017.  She has not had imaging done and this is set up today.  If negative, she will be seen for follow-up in 07/2018 with labs.  She should continue Aromasin through rest of year.     2.  Right leg swelling.  This was noted on exam.  She has extensive varicosities noted.  She denies any calf pain.  She was given option of doppler evaluation but does not desire to have that performed.  She should notify the office if any change in symptoms prior to her next visit    3.  Osteoporosis.  Pt had DEXA scan on 04/15/17 revealed osteoporosis.  She is on Prolia every 6 months. She will need repeat Dexa in 03/2019.  Pt should continue Calcium and vitamin D as directed.    4.   Vitamin B12 deficiency.  Pt had work-up in past revealed high-normal intrinsic factor antibody, elevated anti-parietal cell antibody suspicious for possible pernicious anemia.  On lifelong vitamin B12 injections.  Continue monthly vitamin B12 injections.   5.  Iron deficiency anemia.  Labs done 12/15/2017 and reviewed with pt show WBC 6.1 HB 12.7 plts 187,000.  Ferritin 197.  Chemistries WNL.  Pt was last treated with IV iron 12/2016.  She will have repeat labs in 07/2018.    Current Status:  Pt is seen today for follow-up.  She has not had mammogram.  She remains on Aromasin.       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  Her2 negative, ER 100%, PR 85%, Ki-67 17%.      03/08/2013 Surgery    Right lumpectomy with sentinel node biopsy revealing adjacent DCIS (intermediate grade).  Lymph node is negative for disease.  T1b N0.      03/24/2013 Imaging    Bone density-  Osteoporosis      04/26/2013 Survivorship    Prolia every 6 months      04/28/2013 -  Chemotherapy    Aromasin daily.  She will take for 5 years.      04/08/2015 Imaging    Bone density- There has been a statistically significant IMPROVEMENT compared to patient's previous exam of 03/24/2013.      11/14/2015 Pathology Results  BCI- low risk of late recurrence (year 5-10) at 3.7%, low likelihood of benefit from extended endocrine therapy, low risk of overall recurrence (years 0-10) 6.6%.        Problem List Patient Active Problem List   Diagnosis Date Noted  . Iron deficiency anemia [D50.9] 12/15/2016  . B12 deficiency [E53.8] 06/08/2016  . Malignant neoplasm of upper-outer quadrant of RIGHT female breast ([place]) [C50.419] 04/25/2013  . Osteoporosis [M81.0] 08/10/2007  . COUGH, CHRONIC [R05] 06/28/2007    Past Medical History Past Medical History:  Diagnosis Date  . B12 deficiency  06/08/2016  . Breast cancer ([place])   . Breast cancer, right breast ([place]) 04/25/2013  . Cancer ([place])   . Claustrophobia   . Claustrophobia   . Hypertension   . Iron deficiency anemia 12/15/2016  . Malignant neoplasm of upper-outer quadrant of RIGHT female breast ([place]) 04/25/2013   Stage IA (T1b, N0, M0) invasive mucinous breast carcinoma, S/P right breast lumpectomy by Dr. Arnoldo Morale on 03/08/2013 with 1 negative sentinel node. Cancer was identified as ER+ 100%, PR+ 85%, Her2 negative, and Ki-67 marker at 17%. Oncotype Dx score of 20 with 13% 10 year risk of distant recurrence and absolute benefit of chemotherapy at 10 years in this risk category is negligible and less than 1%.  . Osteoporosis     Past Surgical History Past Surgical History:  Procedure Laterality Date  . ABDOMINAL HYSTERECTOMY    . CATARACT EXTRACTION, BILATERAL    . PARTIAL MASTECTOMY WITH NEEDLE LOCALIZATION AND AXILLARY SENTINEL LYMPH NODE BX Right 03/08/2013   Procedure: PARTIAL MASTECTOMY WITH NEEDLE LOCALIZATION AND AXILLARY SENTINEL LYMPH NODE BX;  Surgeon: Jamesetta So, MD;  Location: [facility];  Service: General;  Laterality: Right;  Sentinel Node Bx @ 7:30am Needle Loc @ 8:00am    Family History Family History  Problem Relation Age of Onset  . Diabetes Mother   . Cancer Father   . Cancer Sister   . Cancer Brother      Social History  reports that she quit smoking about 50 years ago. Her smoking use included cigarettes. She has a 20.00 pack-year smoking history. She has never used smokeless tobacco. She reports that she does not drink alcohol or use drugs.  Medications  Current Outpatient Medications:  .  amLODipine (NORVASC) 5 MG tablet, Take 5 mg by mouth daily., Disp: , Rfl:  .  calcium-vitamin D (OSCAL WITH D) 500-200 MG-UNIT per tablet, Take 2 tablets by mouth daily with breakfast., Disp: 60 tablet, Rfl: 3 .  donepezil (ARICEPT) 10 MG tablet, , Disp: , Rfl:  .  exemestane (AROMASIN) 25 MG tablet, TAKE 1  TABLET BY MOUTH ONCE DAILY AFTER  BREAKFAST, Disp: 90 tablet, Rfl: 1 .  losartan (COZAAR) 25 MG tablet, , Disp: , Rfl:  .  olmesartan (BENICAR) 20 MG tablet, , Disp: , Rfl:   Allergies Patient has no known allergies.  Review of Systems Review of Systems - Oncology ROS as per HPI otherwise 12 point ROS is negative.   Physical Exam  Vitals Wt Readings from Last 3 Encounters:  12/23/17 144 lb 9.6 oz (65.6 kg)  07/16/17 147 lb 8 oz (66.9 kg)  06/17/17 146 lb (66.2 kg)   Temp Readings from Last 3 Encounters:  12/23/17 98.4 F (36.9 C) (Oral)  12/15/17 98.5 F (36.9 C) (Oral)  11/12/17 (!) 97.4 F (36.3 C) (Oral)   BP Readings from Last 3 Encounters:  12/23/17 115/66  12/15/17 (!) 142/63  11/12/17 120/63  Pulse Readings from Last 3 Encounters:  12/23/17 70  12/15/17 69  11/12/17 80   Constitutional: Well-developed, well-nourished, and in no distress.   HENT: Head: Normocephalic and atraumatic.  Mouth/Throat: No oropharyngeal exudate. Mucosa moist. Eyes: Pupils are equal, round, and reactive to light. Conjunctivae are normal. No scleral icterus.  Neck: Normal range of motion. Neck supple. No JVD present.  Cardiovascular: Normal rate, regular rhythm and normal heart sounds.  Exam reveals no gallop and no friction rub.   No murmur heard. Pulmonary/Chest: Effort normal and breath sounds normal. No respiratory distress. No wheezes.No rales.  Abdominal: Soft. Bowel sounds are normal. No distension. There is no tenderness. There is no guarding.  Musculoskeletal: No edema or tenderness.  Lymphadenopathy:    No cervical or supraclavicular adenopathy.  Neurological: Alert and oriented to person, place, and time. No cranial nerve deficit.  Skin: Skin is warm and dry. No rash noted. No erythema. No pallor.  Psychiatric: Affect and judgment normal.  Bilateral breast exam:  Chaperone present.  Left lumpectomy.  No palpable dominant masses noted bilaterally.    Labs No visits with  results within 3 Day(s) from this visit.  Latest known visit with results is:  Appointment on 12/15/2017  Component Date Value Ref Range Status  . WBC 12/15/2017 6.1  4.0 - 10.5 K/uL Final  . RBC 12/15/2017 4.06  3.87 - 5.11 MIL/uL Final  . Hemoglobin 12/15/2017 12.7  12.0 - 15.0 g/dL Final  . HCT 12/15/2017 38.4  36.0 - 46.0 % Final  . MCV 12/15/2017 94.6  78.0 - 100.0 fL Final  . MCH 12/15/2017 31.3  26.0 - 34.0 pg Final  . MCHC 12/15/2017 33.1  30.0 - 36.0 g/dL Final  . RDW 12/15/2017 13.5  11.5 - 15.5 % Final  . Platelets 12/15/2017 187  150 - 400 K/uL Final  . Neutrophils Relative % 12/15/2017 67  % Final  . Neutro Abs 12/15/2017 4.1  1.7 - 7.7 K/uL Final  . Lymphocytes Relative 12/15/2017 20  % Final  . Lymphs Abs 12/15/2017 1.2  0.7 - 4.0 K/uL Final  . Monocytes Relative 12/15/2017 12  % Final  . Monocytes Absolute 12/15/2017 0.7  0.1 - 1.0 K/uL Final  . Eosinophils Relative 12/15/2017 1  % Final  . Eosinophils Absolute 12/15/2017 0.1  0.0 - 0.7 K/uL Final  . Basophils Relative 12/15/2017 0  % Final  . Basophils Absolute 12/15/2017 0.0  0.0 - 0.1 K/uL Final   Performed at [facility], [address] [place], [place] [address]  . Sodium 12/15/2017 138  135 - 145 mmol/L Final  . Potassium 12/15/2017 3.7  3.5 - 5.1 mmol/L Final  . Chloride 12/15/2017 102  101 - 111 mmol/L Final  . CO2 12/15/2017 28  22 - 32 mmol/L Final  . Glucose, Bld 12/15/2017 125* 65 - 99 mg/dL Final  . BUN 12/15/2017 16  6 - 20 mg/dL Final  . Creatinine, Ser 12/15/2017 0.89  0.44 - 1.00 mg/dL Final  . Calcium 12/15/2017 8.8* 8.9 - 10.3 mg/dL Final  . Total Protein 12/15/2017 7.5  6.5 - 8.1 g/dL Final  . Albumin 12/15/2017 3.9  3.5 - 5.0 g/dL Final  . AST 12/15/2017 17  15 - 41 U/L Final  . ALT 12/15/2017 13* 14 - 54 U/L Final  . Alkaline Phosphatase 12/15/2017 68  38 - 126 U/L Final  . Total Bilirubin 12/15/2017 0.9  0.3 - 1.2 mg/dL Final  . GFR calc non Af Wyvonnia Lora 12/15/2017  59* >60 mL/min Final  . GFR  calc Af Amer 12/15/2017 >60  >60 mL/min Final   Comment: (NOTE) The eGFR has been calculated using the CKD EPI equation. This calculation has not been validated in all clinical situations. eGFR's persistently <60 mL/min signify possible Chronic Kidney Disease.   Georgiann Hahn gap 12/15/2017 8  5 - 15 Final   Performed at [facility], [address], [place], [place] [address]  . Vitamin B-12 12/15/2017 322  180 - 914 pg/mL Final   Comment: (NOTE) This assay is not validated for testing neonatal or myeloproliferative syndrome specimens for Vitamin B12 levels. Performed at [facility], [place] [address] [place], [place] [address]   . Folate 12/15/2017 28.0  >5.9 ng/mL Final   Performed at [facility], [place] [address], [place], [place] [address]  . Iron 12/15/2017 107  28 - 170 ug/dL Final  . TIBC 12/15/2017 322  250 - 450 ug/dL Final  . Saturation Ratios 12/15/2017 33* 10.4 - 31.8 % Final  . UIBC 12/15/2017 215  ug/dL Final   Performed at [facility], [place] [address] [place], [place] [address]  . Ferritin 12/15/2017 197  11 - 307 ng/mL Final   Performed at [facility], [place] [address] [place], [place] [address]     Pathology Orders Placed This Encounter  Procedures  . MM DIAG BREAST TOMO BILATERAL    JH/AMY   AMY WILL HAVE DR ADD US'S    Standing Status:   Future    Standing Expiration Date:   12/24/2018    Order Specific Question:   Reason for Exam (SYMPTOM  OR DIAGNOSIS REQUIRED)    Answer:   right breast cancer    Order Specific Question:   Preferred imaging location?    Answer:   [facility]  . CBC with Differential/Platelet    Standing Status:   Future    Standing Expiration Date:   12/24/2019  . Comprehensive metabolic panel    Standing Status:   Future    Standing Expiration Date:   12/24/2019  . Lactate dehydrogenase    Standing Status:   Future    Standing Expiration Date:   12/24/2019  . Ferritin    Standing Status:   Future    Standing  Expiration Date:   12/24/2019  . Vitamin B12    Standing Status:   Future    Standing Expiration Date:   12/24/2019       Zoila Shutter MD

## 2018-02-07 ENCOUNTER — Other Ambulatory Visit (HOSPITAL_COMMUNITY): Payer: Self-pay | Admitting: Internal Medicine

## 2018-02-07 DIAGNOSIS — Z853 Personal history of malignant neoplasm of breast: Secondary | ICD-10-CM

## 2018-02-15 ENCOUNTER — Ambulatory Visit (HOSPITAL_COMMUNITY)
Admission: RE | Admit: 2018-02-15 | Discharge: 2018-02-15 | Disposition: A | Discharge status: Home / Self Care | Payer: PPO | Source: Ambulatory Visit | Attending: Internal Medicine | Admitting: Internal Medicine

## 2018-02-15 DIAGNOSIS — C50411 Malignant neoplasm of upper-outer quadrant of right female breast: Secondary | ICD-10-CM

## 2018-02-15 DIAGNOSIS — Z853 Personal history of malignant neoplasm of breast: Secondary | ICD-10-CM | POA: Diagnosis not present

## 2018-02-15 DIAGNOSIS — Z170 Estrogen receptor positive status [ER+]: Secondary | ICD-10-CM | POA: Diagnosis not present

## 2018-02-15 DIAGNOSIS — R928 Other abnormal and inconclusive findings on diagnostic imaging of breast: Secondary | ICD-10-CM | POA: Diagnosis not present

## 2018-03-25 DIAGNOSIS — J84112 Idiopathic pulmonary fibrosis: Secondary | ICD-10-CM | POA: Diagnosis not present

## 2018-03-25 DIAGNOSIS — R413 Other amnesia: Secondary | ICD-10-CM | POA: Diagnosis not present

## 2018-03-25 DIAGNOSIS — Z79899 Other long term (current) drug therapy: Secondary | ICD-10-CM | POA: Diagnosis not present

## 2018-03-25 DIAGNOSIS — E1129 Type 2 diabetes mellitus with other diabetic kidney complication: Secondary | ICD-10-CM | POA: Diagnosis not present

## 2018-03-25 DIAGNOSIS — I10 Essential (primary) hypertension: Secondary | ICD-10-CM | POA: Diagnosis not present

## 2018-03-29 DIAGNOSIS — Z0001 Encounter for general adult medical examination with abnormal findings: Secondary | ICD-10-CM | POA: Diagnosis not present

## 2018-03-29 DIAGNOSIS — I491 Atrial premature depolarization: Secondary | ICD-10-CM | POA: Diagnosis not present

## 2018-03-29 DIAGNOSIS — J84112 Idiopathic pulmonary fibrosis: Secondary | ICD-10-CM | POA: Diagnosis not present

## 2018-03-29 DIAGNOSIS — E1129 Type 2 diabetes mellitus with other diabetic kidney complication: Secondary | ICD-10-CM | POA: Diagnosis not present

## 2018-03-29 DIAGNOSIS — I10 Essential (primary) hypertension: Secondary | ICD-10-CM | POA: Diagnosis not present

## 2018-03-29 DIAGNOSIS — I700 Atherosclerosis of aorta: Secondary | ICD-10-CM | POA: Diagnosis not present

## 2018-04-25 DIAGNOSIS — Z23 Encounter for immunization: Secondary | ICD-10-CM | POA: Diagnosis not present

## 2018-06-08 ENCOUNTER — Other Ambulatory Visit (HOSPITAL_COMMUNITY): Payer: Self-pay | Admitting: Hematology

## 2018-06-08 DIAGNOSIS — Z170 Estrogen receptor positive status [ER+]: Principal | ICD-10-CM

## 2018-06-08 DIAGNOSIS — C50411 Malignant neoplasm of upper-outer quadrant of right female breast: Secondary | ICD-10-CM

## 2018-06-13 ENCOUNTER — Other Ambulatory Visit (HOSPITAL_COMMUNITY): Payer: Self-pay | Admitting: *Deleted

## 2018-06-13 DIAGNOSIS — C50411 Malignant neoplasm of upper-outer quadrant of right female breast: Secondary | ICD-10-CM

## 2018-06-13 DIAGNOSIS — Z170 Estrogen receptor positive status [ER+]: Principal | ICD-10-CM

## 2018-06-13 MED ORDER — EXEMESTANE 25 MG PO TABS: ORAL_TABLET | ORAL | 1 refills | Status: DC

## 2018-07-21 ENCOUNTER — Encounter (HOSPITAL_COMMUNITY): Payer: Self-pay | Admitting: Emergency Medicine

## 2018-07-21 ENCOUNTER — Emergency Department (HOSPITAL_COMMUNITY)
Admission: EM | Admit: 2018-07-21 | Discharge: 2018-07-21 | Disposition: A | Discharge status: Home / Self Care | Payer: PPO | Attending: Emergency Medicine | Admitting: Emergency Medicine

## 2018-07-21 ENCOUNTER — Other Ambulatory Visit: Payer: Self-pay

## 2018-07-21 ENCOUNTER — Emergency Department (HOSPITAL_COMMUNITY): Payer: PPO

## 2018-07-21 DIAGNOSIS — Y999 Unspecified external cause status: Secondary | ICD-10-CM | POA: Insufficient documentation

## 2018-07-21 DIAGNOSIS — F0390 Unspecified dementia without behavioral disturbance: Secondary | ICD-10-CM | POA: Insufficient documentation

## 2018-07-21 DIAGNOSIS — S2241XA Multiple fractures of ribs, right side, initial encounter for closed fracture: Secondary | ICD-10-CM

## 2018-07-21 DIAGNOSIS — W19XXXA Unspecified fall, initial encounter: Secondary | ICD-10-CM | POA: Diagnosis not present

## 2018-07-21 DIAGNOSIS — W010XXA Fall on same level from slipping, tripping and stumbling without subsequent striking against object, initial encounter: Secondary | ICD-10-CM | POA: Diagnosis not present

## 2018-07-21 DIAGNOSIS — R52 Pain, unspecified: Secondary | ICD-10-CM | POA: Diagnosis not present

## 2018-07-21 DIAGNOSIS — I10 Essential (primary) hypertension: Secondary | ICD-10-CM | POA: Insufficient documentation

## 2018-07-21 DIAGNOSIS — Y9389 Activity, other specified: Secondary | ICD-10-CM | POA: Diagnosis not present

## 2018-07-21 DIAGNOSIS — S299XXA Unspecified injury of thorax, initial encounter: Secondary | ICD-10-CM | POA: Diagnosis present

## 2018-07-21 DIAGNOSIS — Z87891 Personal history of nicotine dependence: Secondary | ICD-10-CM | POA: Diagnosis not present

## 2018-07-21 DIAGNOSIS — R9431 Abnormal electrocardiogram [ECG] [EKG]: Secondary | ICD-10-CM | POA: Diagnosis not present

## 2018-07-21 DIAGNOSIS — Y92008 Other place in unspecified non-institutional (private) residence as the place of occurrence of the external cause: Secondary | ICD-10-CM | POA: Insufficient documentation

## 2018-07-21 HISTORY — DX: Unspecified dementia, unspecified severity, without behavioral disturbance, psychotic disturbance, mood disturbance, and anxiety: F03.90

## 2018-07-21 LAB — CBC
HCT: 40.2 % (ref 36.0–46.0)
Hemoglobin: 13 g/dL (ref 12.0–15.0)
MCH: 31.3 pg (ref 26.0–34.0)
MCHC: 32.3 g/dL (ref 30.0–36.0)
MCV: 96.9 fL (ref 80.0–100.0)
NRBC: 0 % (ref 0.0–0.2)
Platelets: 240 10*3/uL (ref 150–400)
RBC: 4.15 MIL/uL (ref 3.87–5.11)
RDW: 13.2 % (ref 11.5–15.5)
WBC: 13.3 10*3/uL — ABNORMAL HIGH (ref 4.0–10.5)

## 2018-07-21 LAB — POCT I-STAT TROPONIN I: Troponin i, poc: 0.01 ng/mL (ref 0.00–0.08)

## 2018-07-21 LAB — BASIC METABOLIC PANEL
ANION GAP: 9 (ref 5–15)
BUN: 21 mg/dL (ref 8–23)
CO2: 25 mmol/L (ref 22–32)
Calcium: 9 mg/dL (ref 8.9–10.3)
Chloride: 105 mmol/L (ref 98–111)
Creatinine, Ser: 0.97 mg/dL (ref 0.44–1.00)
GFR calc non Af Amer: 54 mL/min — ABNORMAL LOW (ref >60)
Glucose, Bld: 143 mg/dL — ABNORMAL HIGH (ref 70–99)
Potassium: 3.6 mmol/L (ref 3.5–5.1)
Sodium: 139 mmol/L (ref 135–145)

## 2018-07-21 MED ORDER — HYDROCODONE-ACETAMINOPHEN 5-325 MG PO TABS
1.0000 | ORAL_TABLET | Freq: Once | ORAL | Status: AC
  Administered 2018-07-21: 1 via ORAL
  Filled 2018-07-21: qty 1

## 2018-07-21 MED ORDER — HYDROCODONE-ACETAMINOPHEN 5-325 MG PO TABS: 1.0000 | ORAL_TABLET | ORAL | 0 refills | Status: DC | PRN

## 2018-07-21 MED ORDER — LIDOCAINE 5 % EX PTCH
1.0000 | MEDICATED_PATCH | CUTANEOUS | Status: DC
  Administered 2018-07-21: 1 via TRANSDERMAL
  Filled 2018-07-21: qty 1

## 2018-07-21 MED ORDER — LIDOCAINE 5 % EX PTCH: 1.0000 | MEDICATED_PATCH | CUTANEOUS | 0 refills | Status: DC

## 2018-07-21 NOTE — ED Provider Notes (Signed)
[facility] Emergency Department Provider Note MRN:  [id number]  Arrival date & time: 07/21/18     Chief Complaint   Fall   History of Present Illness   Tamara [name] is a 85 y.o. year-old female with a history of breast cancer, dementia, osteoporosis presenting to the ED with chief complaint of fall.  Patient was entering the back door of her home and does not remember what happened but all of a sudden she was on the ground.  Does not remember experiencing any dizziness or chest pain prior to the fall, unsure if she missed a step or lost her balance.  Unsure of head trauma.  Currently without complaints with the exception of right sided thoracic back pain.  Denies numbness or weakness, no headache, no vomiting, no chest pain, no shortness of breath, no abdominal pain.  Symptoms are constant, worse with motion.  8 out of 10 in severity.  Review of Systems  A complete 10 system review of systems was obtained and all systems are negative except as noted in the HPI and PMH.   Patient's Health History    Past Medical History:  Diagnosis Date  . B12 deficiency 06/08/2016  . Breast cancer ([place])   . Breast cancer, right breast ([place]) 04/25/2013  . Cancer ([place])   . Claustrophobia   . Claustrophobia   . Dementia ([place])   . Hypertension   . Iron deficiency anemia 12/15/2016  . Malignant neoplasm of upper-outer quadrant of RIGHT female breast ([place]) 04/25/2013   Stage IA (T1b, N0, M0) invasive mucinous breast carcinoma, S/P right breast lumpectomy by Dr. Arnoldo Morale on 03/08/2013 with 1 negative sentinel node. Cancer was identified as ER+ 100%, PR+ 85%, Her2 negative, and Ki-67 marker at 17%. Oncotype Dx score of 20 with 13% 10 year risk of distant recurrence and absolute benefit of chemotherapy at 10 years in this risk category is negligible and less than 1%.  . Osteoporosis     Past Surgical History:  Procedure Laterality Date  . ABDOMINAL HYSTERECTOMY    . CATARACT EXTRACTION,  BILATERAL    . PARTIAL MASTECTOMY WITH NEEDLE LOCALIZATION AND AXILLARY SENTINEL LYMPH NODE BX Right 03/08/2013   Procedure: PARTIAL MASTECTOMY WITH NEEDLE LOCALIZATION AND AXILLARY SENTINEL LYMPH NODE BX;  Surgeon: Jamesetta So, MD;  Location: [facility];  Service: General;  Laterality: Right;  Sentinel Node Bx @ 7:30am Needle Loc @ 8:00am    Family History  Problem Relation Age of Onset  . Diabetes Mother   . Cancer Father   . Cancer Sister   . Cancer Brother     Social History   Socioeconomic History  . Marital status: Widowed    Spouse name: Not on file  . Number of children: Not on file  . Years of education: Not on file  . Highest education level: Not on file  Occupational History  . Not on file  Social Needs  . Financial resource strain: Not on file  . Food insecurity:    Worry: Not on file    Inability: Not on file  . Transportation needs:    Medical: Not on file    Non-medical: Not on file  Tobacco Use  . Smoking status: Former Smoker    Packs/day: 0.50    Years: 40.00    Pack years: 20.00    Types: Cigarettes    Last attempt to quit: 03/03/1967    Years since quitting: 51.4  . Smokeless tobacco: Never Used  Substance and Sexual Activity  . Alcohol use: No  . Drug use: No  . Sexual activity: Yes    Birth control/protection: Surgical  Lifestyle  . Physical activity:    Days per week: Not on file    Minutes per session: Not on file  . Stress: Not on file  Relationships  . Social connections:    Talks on phone: Not on file    Gets together: Not on file    Attends religious service: Not on file    Active member of club or organization: Not on file    Attends meetings of clubs or organizations: Not on file    Relationship status: Not on file  . Intimate partner violence:    Fear of current or ex partner: Not on file    Emotionally abused: Not on file    Physically abused: Not on file    Forced sexual activity: Not on file  Other Topics Concern  . Not on  file  Social History Narrative  . Not on file     Physical Exam  Vital Signs and Nursing Notes reviewed Vitals:   07/21/18 1308 07/21/18 1627  BP: 119/64 (!) 141/62  Pulse: 76 88  Resp: 18 12  Temp: 98.3 F (36.8 C)   SpO2: 100% 98%    CONSTITUTIONAL: Well-appearing, NAD NEURO:  Alert and oriented x 3, no focal deficits EYES:  eyes equal and reactive ENT/NECK:  no LAD, no JVD CARDIO: Regular rate, well-perfused, normal S1 and S2 PULM:  CTAB no wheezing or rhonchi GI/GU:  normal bowel sounds, non-distended, non-tender MSK/SPINE:  No gross deformities, no edema; tenderness to palpation to the right paraspinal thoracic back SKIN:  no rash, atraumatic PSYCH:  Appropriate speech and behavior  Diagnostic and Interventional Summary    EKG Interpretation  Date/Time:  Thursday July 21 2018 13:12:48 EST Ventricular Rate:  76 PR Interval:  196 QRS Duration: 68 QT Interval:  384 QTC Calculation: 432 R Axis:   -9 Text Interpretation:  Sinus rhythm with frequent Premature ventricular complexes Inferior infarct , age undetermined Abnormal ECG No significant change since last tracing Confirmed by Orlie Dakin [phone]) on 07/21/2018 1:30:58 PM      Labs Reviewed  CBC - Abnormal; Notable for the following components:      Result Value   WBC 13.3 (*)    All other components within normal limits  BASIC METABOLIC PANEL - Abnormal; Notable for the following components:   Glucose, Bld 143 (*)    GFR calc non Af Amer 54 (*)    All other components within normal limits  URINALYSIS, ROUTINE W REFLEX MICROSCOPIC  I-STAT TROPONIN, ED  POCT I-STAT TROPONIN I    DG Ribs Unilateral W/Chest Right  Final Result      Medications  lidocaine (LIDODERM) 5 % 1 patch (1 patch Transdermal Patch Applied 07/21/18 1629)  HYDROcodone-acetaminophen (NORCO/VICODIN) 5-325 MG per tablet 1 tablet (1 tablet Oral Given 07/21/18 1629)     Procedures Critical Care  ED Course and Medical Decision Making    I have reviewed the triage vital signs and the nursing notes.  Pertinent labs & imaging results that were available during my care of the patient were reviewed by me and considered in my medical decision making (see below for details).  85 year old female with history of dementia here with fall, unclear if syncopal versus mechanical.  No prodrome that she remembers, though with her dementia she does have occasions where she does not  remember periods of time.  Considering mechanical fall versus metabolic disarray versus less likely cardiac syncope.  EKG with no concerning features, labs pending.  X-ray does reveal 2 rib fractures.  Labs unremarkable, troponin negative.  Patient continues to feel well with the exception of pain related to the rib fractures.  No chest pain, no shortness of breath, no arrhythmia on telemetry during her visit here in the ED.  Favoring mechanical fall.  No evidence of DVT on exam, nothing to suggest pulmonary embolism.  Patient advised to follow-up closely with her primary care provider, strict return precautions.  Provided with incentive spirometer and pain control regimen.  After the discussed management above, the patient was determined to be safe for discharge.  The patient was in agreement with this plan and all questions regarding their care were answered.  ED return precautions were discussed and the patient will return to the ED with any significant worsening of condition.  Barth Kirks. [place] Small, [place] [email or web address]  Final Clinical Impressions(s) / ED Diagnoses     ICD-10-CM   1. Fall, initial encounter W19.XXXA   2. Closed fracture of multiple ribs of right side, initial encounter S22.41XA     ED Discharge Orders         Ordered    HYDROcodone-acetaminophen (NORCO/VICODIN) 5-325 MG tablet  Every 4 hours PRN     07/21/18 1838    lidocaine (LIDODERM) 5 %  Every 24 hours     07/21/18 1838              Maudie Flakes, MD 07/21/18 1840

## 2018-07-21 NOTE — ED Triage Notes (Addendum)
Patient states she was coming in the house and fell. Complaining of right sided back pain. Denies head injury or LOC. Patient states she does not know what made her fall. States "just all of a sudden I was in the floor."

## 2018-07-21 NOTE — ED Notes (Signed)
Have paged respiratory to give incentive spirometer

## 2018-07-21 NOTE — Discharge Instructions (Addendum)
You were evaluated in the Emergency Department and after careful evaluation, we did not find any emergent condition requiring admission or further testing in the hospital.  Your symptoms today seem to be due to broken ribs.  Please take the pain medications provided to help you take deep breaths throughout the day.  Deep breaths will help prevent pneumonia.  Please follow-up closely with your primary care provider.  Please return to the Emergency Department if you experience any worsening of your condition.  We encourage you to follow up with a primary care provider.  Thank you for allowing [place] to be a part of your care.

## 2018-07-28 DIAGNOSIS — S2241XA Multiple fractures of ribs, right side, initial encounter for closed fracture: Secondary | ICD-10-CM | POA: Diagnosis not present

## 2018-07-28 DIAGNOSIS — J84112 Idiopathic pulmonary fibrosis: Secondary | ICD-10-CM | POA: Diagnosis not present

## 2018-08-23 ENCOUNTER — Inpatient Hospital Stay (HOSPITAL_COMMUNITY): Payer: PPO | Attending: Hematology

## 2018-08-23 DIAGNOSIS — Z79899 Other long term (current) drug therapy: Secondary | ICD-10-CM | POA: Insufficient documentation

## 2018-08-23 DIAGNOSIS — E538 Deficiency of other specified B group vitamins: Secondary | ICD-10-CM | POA: Diagnosis not present

## 2018-08-23 DIAGNOSIS — Z170 Estrogen receptor positive status [ER+]: Secondary | ICD-10-CM | POA: Diagnosis not present

## 2018-08-23 DIAGNOSIS — M810 Age-related osteoporosis without current pathological fracture: Secondary | ICD-10-CM | POA: Insufficient documentation

## 2018-08-23 DIAGNOSIS — C50411 Malignant neoplasm of upper-outer quadrant of right female breast: Secondary | ICD-10-CM | POA: Insufficient documentation

## 2018-08-23 LAB — CBC WITH DIFFERENTIAL/PLATELET
Abs Immature Granulocytes: 0.02 10*3/uL (ref 0.00–0.07)
Basophils Absolute: 0 10*3/uL (ref 0.0–0.1)
Basophils Relative: 0 %
Eosinophils Absolute: 0.1 10*3/uL (ref 0.0–0.5)
Eosinophils Relative: 1 %
HCT: 38 % (ref 36.0–46.0)
HEMOGLOBIN: 12 g/dL (ref 12.0–15.0)
Immature Granulocytes: 0 %
LYMPHS ABS: 1.3 10*3/uL (ref 0.7–4.0)
LYMPHS PCT: 17 %
MCH: 30.2 pg (ref 26.0–34.0)
MCHC: 31.6 g/dL (ref 30.0–36.0)
MCV: 95.5 fL (ref 80.0–100.0)
Monocytes Absolute: 0.9 10*3/uL (ref 0.1–1.0)
Monocytes Relative: 12 %
Neutro Abs: 5 10*3/uL (ref 1.7–7.7)
Neutrophils Relative %: 70 %
Platelets: 190 10*3/uL (ref 150–400)
RBC: 3.98 MIL/uL (ref 3.87–5.11)
RDW: 13.9 % (ref 11.5–15.5)
WBC: 7.3 10*3/uL (ref 4.0–10.5)
nRBC: 0 % (ref 0.0–0.2)

## 2018-08-23 LAB — COMPREHENSIVE METABOLIC PANEL
ALT: 11 U/L (ref 0–44)
AST: 16 U/L (ref 15–41)
Albumin: 3.9 g/dL (ref 3.5–5.0)
Alkaline Phosphatase: 73 U/L (ref 38–126)
Anion gap: 8 (ref 5–15)
BUN: 20 mg/dL (ref 8–23)
CO2: 26 mmol/L (ref 22–32)
CREATININE: 0.86 mg/dL (ref 0.44–1.00)
Calcium: 8.9 mg/dL (ref 8.9–10.3)
Chloride: 105 mmol/L (ref 98–111)
GFR calc Af Amer: >60 mL/min (ref >60)
GFR calc non Af Amer: >60 mL/min (ref >60)
Glucose, Bld: 109 mg/dL — ABNORMAL HIGH (ref 70–99)
Potassium: 3.9 mmol/L (ref 3.5–5.1)
Sodium: 139 mmol/L (ref 135–145)
Total Bilirubin: 0.7 mg/dL (ref 0.3–1.2)
Total Protein: 7.2 g/dL (ref 6.5–8.1)

## 2018-08-23 LAB — LACTATE DEHYDROGENASE: LDH: 174 U/L (ref 98–192)

## 2018-08-23 LAB — VITAMIN B12: Vitamin B-12: 171 pg/mL — ABNORMAL LOW (ref 180–914)

## 2018-08-23 LAB — FERRITIN: Ferritin: 178 ng/mL (ref 11–307)

## 2018-08-30 ENCOUNTER — Inpatient Hospital Stay (HOSPITAL_BASED_OUTPATIENT_CLINIC_OR_DEPARTMENT_OTHER): Payer: PPO | Admitting: Hematology

## 2018-08-30 ENCOUNTER — Other Ambulatory Visit: Payer: Self-pay

## 2018-08-30 ENCOUNTER — Encounter (HOSPITAL_COMMUNITY): Payer: Self-pay | Admitting: Hematology

## 2018-08-30 ENCOUNTER — Ambulatory Visit (HOSPITAL_COMMUNITY): Payer: PPO

## 2018-08-30 VITALS — BP 125/59 | HR 81 | Temp 98.1°F | Resp 16 | Wt 148.0 lb

## 2018-08-30 DIAGNOSIS — Z79899 Other long term (current) drug therapy: Secondary | ICD-10-CM

## 2018-08-30 DIAGNOSIS — M810 Age-related osteoporosis without current pathological fracture: Secondary | ICD-10-CM

## 2018-08-30 DIAGNOSIS — C50411 Malignant neoplasm of upper-outer quadrant of right female breast: Secondary | ICD-10-CM | POA: Diagnosis not present

## 2018-08-30 DIAGNOSIS — E538 Deficiency of other specified B group vitamins: Secondary | ICD-10-CM | POA: Diagnosis not present

## 2018-08-30 DIAGNOSIS — Z170 Estrogen receptor positive status [ER+]: Secondary | ICD-10-CM

## 2018-08-30 MED ORDER — CYANOCOBALAMIN 1000 MCG/ML IJ SOLN
1000.0000 ug | Freq: Once | INTRAMUSCULAR | Status: AC
  Administered 2018-08-30: 1000 ug via INTRAMUSCULAR

## 2018-08-30 MED ORDER — DENOSUMAB 60 MG/ML ~~LOC~~ SOSY
60.0000 mg | PREFILLED_SYRINGE | Freq: Once | SUBCUTANEOUS | Status: AC
  Administered 2018-08-30: 60 mg via SUBCUTANEOUS
  Filled 2018-08-30: qty 1

## 2018-08-30 MED ORDER — CYANOCOBALAMIN 1000 MCG/ML IJ SOLN
INTRAMUSCULAR | Status: AC
  Filled 2018-08-30: qty 1

## 2018-08-30 NOTE — Progress Notes (Signed)
[address] [place], [place] [address]   CLINIC:  Medical Oncology/Hematology  PCP:  [name], [address] [address] [phone]   REASON FOR VISIT: Follow-up for Stage IA invasive mucinous carcinoma of (R) breast; ER+/PR+/HER2-.  CURRENT THERAPY: Observation.  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  Her2 negative, ER 100%, PR 85%, Ki-67 17%.    03/08/2013 Surgery    Right lumpectomy with sentinel node biopsy revealing adjacent DCIS (intermediate grade).  Lymph node is negative for disease.  T1b N0.    03/24/2013 Imaging    Bone density-  Osteoporosis    04/26/2013 Survivorship    Prolia every 6 months    04/28/2013 -  Chemotherapy    Aromasin daily.  She will take for 5 years.    04/08/2015 Imaging    Bone density- There has been a statistically significant IMPROVEMENT compared to patient's previous exam of 03/24/2013.    11/14/2015 Pathology Results    BCI- low risk of late recurrence (year 5-10) at 3.7%, low likelihood of benefit from extended endocrine therapy, low risk of overall recurrence (years 0-10) 6.6%.      CANCER STAGING: Cancer Staging Malignant neoplasm of upper-outer quadrant of RIGHT female breast ([place]) Staging form: Breast, AJCC 7th Edition - Clinical: Stage IA (T1b, N0, cM0) - Signed by Baird Cancer, PA-C on 04/25/2013    INTERVAL HISTORY:  Ms. Norland 85 y.o. female returns for routine follow-up for invasive mucinous carcinoma of the right breast. She is doing well since her last visit with no complaints. She is taking her vitamin D and trying to remain active at home. Her next mammogram will be at the end of July. She denies any new lumps or pain. Denies any nausea, vomiting, or diarrhea. Denies any new pains. Had not noticed any  recent bleeding such as epistaxis, hematuria or hematochezia. Denies recent chest pain on exertion, shortness of breath on minimal exertion, pre-syncopal episodes, or palpitations. Denies any numbness or tingling in hands or feet. Denies any recent fevers, infections, or recent hospitalizations. Patient reports appetite at 100% and energy level at 100%. She is eating well and maintaining her weight at this time.   REVIEW OF SYSTEMS:  Review of Systems  All other systems reviewed and are negative.    PAST MEDICAL/SURGICAL HISTORY:  Past Medical History:  Diagnosis Date  . B12 deficiency 06/08/2016  . Breast cancer ([place])   . Breast cancer, right breast ([place]) 04/25/2013  . Cancer ([place])   . Claustrophobia   . Claustrophobia   . Dementia ([place])   . Hypertension   . Iron deficiency anemia 12/15/2016  . Malignant neoplasm of upper-outer quadrant of RIGHT female breast ([place]) 04/25/2013   Stage IA (T1b, N0, M0) invasive mucinous breast carcinoma, S/P right breast lumpectomy by Dr. Arnoldo Morale on 03/08/2013 with 1 negative sentinel node. Cancer was identified as ER+ 100%, PR+ 85%, Her2 negative, and Ki-67 marker at 17%. Oncotype Dx score of 20 with 13% 10 year risk of distant recurrence and absolute benefit of chemotherapy at 10 years in this risk category is negligible and less than 1%.  . Osteoporosis    Past Surgical History:  Procedure Laterality Date  . ABDOMINAL HYSTERECTOMY    . CATARACT EXTRACTION, BILATERAL    . PARTIAL MASTECTOMY WITH  NEEDLE LOCALIZATION AND AXILLARY SENTINEL LYMPH NODE BX Right 03/08/2013   Procedure: PARTIAL MASTECTOMY WITH NEEDLE LOCALIZATION AND AXILLARY SENTINEL LYMPH NODE BX;  Surgeon: Jamesetta So, MD;  Location: [facility];  Service: General;  Laterality: Right;  Sentinel Node Bx @ 7:30am Needle Loc @ 8:00am     SOCIAL HISTORY:  Social History   Socioeconomic History  . Marital status: Widowed    Spouse name: Not on file  . Number of children: Not on file  .  Years of education: Not on file  . Highest education level: Not on file  Occupational History  . Not on file  Social Needs  . Financial resource strain: Not on file  . Food insecurity:    Worry: Not on file    Inability: Not on file  . Transportation needs:    Medical: Not on file    Non-medical: Not on file  Tobacco Use  . Smoking status: Former Smoker    Packs/day: 0.50    Years: 40.00    Pack years: 20.00    Types: Cigarettes    Last attempt to quit: 03/03/1967    Years since quitting: 51.5  . Smokeless tobacco: Never Used  Substance and Sexual Activity  . Alcohol use: No  . Drug use: No  . Sexual activity: Yes    Birth control/protection: Surgical  Lifestyle  . Physical activity:    Days per week: Not on file    Minutes per session: Not on file  . Stress: Not on file  Relationships  . Social connections:    Talks on phone: Not on file    Gets together: Not on file    Attends religious service: Not on file    Active member of club or organization: Not on file    Attends meetings of clubs or organizations: Not on file    Relationship status: Not on file  . Intimate partner violence:    Fear of current or ex partner: Not on file    Emotionally abused: Not on file    Physically abused: Not on file    Forced sexual activity: Not on file  Other Topics Concern  . Not on file  Social History Narrative  . Not on file    FAMILY HISTORY:  Family History  Problem Relation Age of Onset  . Diabetes Mother   . Cancer Father   . Cancer Sister   . Cancer Brother     CURRENT MEDICATIONS:  Outpatient Encounter Medications as of 08/30/2018  Medication Sig  . amLODipine (NORVASC) 5 MG tablet Take 5 mg by mouth daily.  . calcium-vitamin D (OSCAL WITH D) 500-200 MG-UNIT per tablet Take 2 tablets by mouth daily with breakfast.  . donepezil (ARICEPT) 10 MG tablet   . exemestane (AROMASIN) 25 MG tablet TAKE 1 TABLET BY MOUTH ONCE DAILY AFTER  BREAKFAST  .  HYDROcodone-acetaminophen (NORCO/VICODIN) 5-325 MG tablet Take 1 tablet by mouth every 4 (four) hours as needed.  . lidocaine (LIDODERM) 5 % Place 1 patch onto the skin daily. Remove & Discard patch within 12 hours or as directed by MD  . losartan (COZAAR) 25 MG tablet   . olmesartan (BENICAR) 20 MG tablet   . [EXPIRED] cyanocobalamin ((VITAMIN B-12)) injection 1,000 mcg   . [EXPIRED] denosumab (PROLIA) injection 60 mg    No facility-administered encounter medications on file as of 08/30/2018.     ALLERGIES:  No Known Allergies   PHYSICAL EXAM:  ECOG Performance status:  1  Vitals:   08/30/18 1453  BP: (!) 125/59  Pulse: 81  Resp: 16  Temp: 98.1 F (36.7 C)  SpO2: 98%   Filed Weights   08/30/18 1453  Weight: 148 lb (67.1 kg)    Physical Exam Constitutional:      Appearance: Normal appearance. She is normal weight.  Cardiovascular:     Rate and Rhythm: Normal rate and regular rhythm.     Heart sounds: Normal heart sounds.  Pulmonary:     Effort: Pulmonary effort is normal.     Breath sounds: Normal breath sounds.  Abdominal:     General: Abdomen is flat.     Palpations: Abdomen is soft.  Musculoskeletal: Normal range of motion.  Skin:    General: Skin is warm and dry.  Neurological:     Mental Status: She is alert and oriented to person, place, and time. Mental status is at baseline.  Psychiatric:        Mood and Affect: Mood normal.        Behavior: Behavior normal.        Thought Content: Thought content normal.        Judgment: Judgment normal.   Breast: No palpable masses, no skin changes or nipple discharge, no adenopathy.    LABORATORY DATA:  I have reviewed the labs as listed.  CBC    Component Value Date/Time   WBC 7.3 08/23/2018 1101   RBC 3.98 08/23/2018 1101   HGB 12.0 08/23/2018 1101   HCT 38.0 08/23/2018 1101   PLT 190 08/23/2018 1101   MCV 95.5 08/23/2018 1101   MCH 30.2 08/23/2018 1101   MCHC 31.6 08/23/2018 1101   RDW 13.9  08/23/2018 1101   LYMPHSABS 1.3 08/23/2018 1101   MONOABS 0.9 08/23/2018 1101   EOSABS 0.1 08/23/2018 1101   BASOSABS 0.0 08/23/2018 1101   CMP Latest Ref Rng & Units 08/23/2018 07/21/2018 12/15/2017  Glucose 70 - 99 mg/dL 109(H) 143(H) 125(H)  BUN 8 - 23 mg/dL _0 Creatinine 0.44 - 1.00 mg/dL 0.86 0.97 0.89  Sodium 135 - 145 mmol/L 139 139 138  Potassium 3.5 - 5.1 mmol/L 3.9 3.6 3.7  Chloride 98 - 111 mmol/L 105 105 102  CO2 22 - 32 mmol/L _1 Calcium 8.9 - 10.3 mg/dL 8.9 9.0 8.8(L)  Total Protein 6.5 - 8.1 g/dL 7.2 - 7.5  Total Bilirubin 0.3 - 1.2 mg/dL 0.7 - 0.9  Alkaline Phos 38 - 126 U/L 73 - 68  AST 15 - 41 U/L 16 - 17  ALT 0 - 44 U/L 11 - 13(L)       DIAGNOSTIC IMAGING:  I have independently reviewed the scans and discussed with the patient.   I have reviewed Francene Finders, NP's note and agree with the documentation.  I personally performed a face-to-face visit, made revisions and my assessment and plan is as follows.    ASSESSMENT & PLAN:   Malignant neoplasm of upper-outer quadrant of RIGHT female breast ([place]) 1.  Stage Ia (T1b N0 M0) right breast IDC: - Status post lumpectomy and lymph node biopsy on 03/08/2013, invasive mucinous carcinoma, 0.6 cm, grade 1, margins negative, ER/PR positive, HER-2 negative, Ki-67 17%, PT1BPN0 - Oncotype DX shows recurrence score of 20, average rate of distant recurrence of 13%. - Adjuvant chemotherapy was not offered.  Patient declined radiation therapy. -Aromasin was started in October 2014, continue dental October 2019. -BCI showed low risk of recurrence and low benefit  from extended adjuvant endocrine therapy. - Today's physical examination showed right upper outer quadrant lumpectomy scar well-healed.  No palpable mass in bilateral breast.  No palpable axillary adenopathy. -We have also reviewed mammogram dated 02/15/2018 which was BI-RADS Category 2. -We will schedule her for another mammogram in July.  We will see her  back in 6 months for follow-up.  2.  Osteoporosis: -DEXA scan on 04/15/2017 shows T score of -2.5. -She is receiving Prolia every 6 months, last on 11/25/2017.  She missed her follow-up injections after that. -We will restart her back on Prolia today.  She was told to take calcium supplements daily.  3.  B12 deficiency: -She was receiving B12 injections monthly until May 2019. -We have reviewed her B12 level which was in the 170 range.  Hence I have recommended her to go back on B12 injections monthly.  We will start them today.      Orders placed this encounter:  Orders Placed This Encounter  Procedures  . MM Digital Diagnostic Bilat  . US Breast Limited Uni Right Inc Axilla  . US Breast Limited Uni Left Inc Axilla  . CBC with Differential/Platelet  . Comprehensive metabolic panel  . Ferritin  . Iron and TIBC  . Vitamin B12  . VITAMIN D 25 Hydroxy (Vit-D Deficiency, Fractures)  . Folate  . [place], [place] [phone]

## 2018-08-30 NOTE — Patient Instructions (Signed)
[facility] Discharge Instructions  Follow up in 6 months with labs    Thank you for choosing  Cancer Center at [facility] to provide your oncology and hematology care.  To afford each patient quality time with our provider, please arrive at least 15 minutes before your scheduled appointment time.   If you have a lab appointment with the Cancer Center please come in thru the  Main Entrance and check in at the main information desk  You need to re-schedule your appointment should you arrive 10 or more minutes late.  We strive to give you quality time with our providers, and arriving late affects you and other patients whose appointments are after yours.  Also, if you no show three or more times for appointments you may be dismissed from the clinic at the providers discretion.     Again, thank you for choosing [facility].  Our hope is that these requests will decrease the amount of time that you wait before being seen by our physicians.       _____________________________________________________________  Should you have questions after your visit to [facility], please contact our office at [phone] between the hours of 8:00 a.m. and 4:30 p.m.  Voicemails left after 4:00 p.m. will not be returned until the following business day.  For prescription refill requests, have your pharmacy contact our office and allow 72 hours.    Cancer Center Support Programs:   > Cancer Support Group  2nd Tuesday of the month 1pm-2pm, Journey Room    

## 2018-08-30 NOTE — Assessment & Plan Note (Signed)
1.  Stage Ia (T1b N0 M0) right breast IDC: - Status post lumpectomy and lymph node biopsy on 03/08/2013, invasive mucinous carcinoma, 0.6 cm, grade 1, margins negative, ER/PR positive, HER-2 negative, Ki-67 17%, PT1BPN0 - Oncotype DX shows recurrence score of 20, average rate of distant recurrence of 13%. - Adjuvant chemotherapy was not offered.  Patient declined radiation therapy. -Aromasin was started in October 2014, continue dental October 2019. -BCI showed low risk of recurrence and low benefit from extended adjuvant endocrine therapy. - Today's physical examination showed right upper outer quadrant lumpectomy scar well-healed.  No palpable mass in bilateral breast.  No palpable axillary adenopathy. -We have also reviewed mammogram dated 02/15/2018 which was BI-RADS Category 2. -We will schedule her for another mammogram in July.  We will see her back in 6 months for follow-up.  2.  Osteoporosis: -DEXA scan on 04/15/2017 shows T score of -2.5. -She is receiving Prolia every 6 months, last on 11/25/2017.  She missed her follow-up injections after that. -We will restart her back on Prolia today.  She was told to take calcium supplements daily.  3.  B12 deficiency: -She was receiving B12 injections monthly until May 2019. -We have reviewed her B12 level which was in the 170 range.  Hence I have recommended her to go back on B12 injections monthly.  We will start them today.

## 2018-08-30 NOTE — Progress Notes (Signed)
Tamara [name] tolerated Vit B12 and Prolia injections well without complaints or incident. Calcium 8.9 today and pt denied any tooth or jaw pain and no recent or future dental visits. Pt continues to take her Calcium PO as prescribed without issues. Pt discharged self ambulatory in satisfactory condition

## 2018-08-30 NOTE — Patient Instructions (Signed)
[facility] at [facility] Discharge Instructions  Received Vit B12 and Prolia injections today. Follow-up as scheduled. Call clinic for any questions or concerns   Thank you for choosing [facility] at [facility] to provide your oncology and hematology care.  To afford each patient quality time with our provider, please arrive at least 15 minutes before your scheduled appointment time.   If you have a lab appointment with the Cancer Center please come in thru the  Main Entrance and check in at the main information desk  You need to re-schedule your appointment should you arrive 10 or more minutes late.  We strive to give you quality time with our providers, and arriving late affects you and other patients whose appointments are after yours.  Also, if you no show three or more times for appointments you may be dismissed from the clinic at the providers discretion.     Again, thank you for choosing  Cancer Center.  Our hope is that these requests will decrease the amount of time that you wait before being seen by our physicians.       _____________________________________________________________  Should you have questions after your visit to  Cancer Center, please contact our office at [phone] between the hours of 8:00 a.m. and 4:30 p.m.  Voicemails left after 4:00 p.m. will not be returned until the following business day.  For prescription refill requests, have your pharmacy contact our office and allow 72 hours.    Cancer Center Support Programs:   > Cancer Support Group  2nd Tuesday of the month 1pm-2pm, Journey Room   

## 2018-08-31 ENCOUNTER — Other Ambulatory Visit (HOSPITAL_COMMUNITY): Payer: Self-pay | Admitting: Nurse Practitioner

## 2018-08-31 DIAGNOSIS — Z1231 Encounter for screening mammogram for malignant neoplasm of breast: Secondary | ICD-10-CM

## 2018-09-28 ENCOUNTER — Ambulatory Visit (HOSPITAL_COMMUNITY): Payer: PPO

## 2018-09-30 ENCOUNTER — Inpatient Hospital Stay (HOSPITAL_COMMUNITY): Payer: PPO | Attending: Hematology

## 2018-09-30 ENCOUNTER — Other Ambulatory Visit: Payer: Self-pay

## 2018-09-30 VITALS — BP 129/65 | HR 76 | Temp 98.0°F | Resp 16

## 2018-09-30 DIAGNOSIS — M810 Age-related osteoporosis without current pathological fracture: Secondary | ICD-10-CM | POA: Diagnosis not present

## 2018-09-30 DIAGNOSIS — Z79899 Other long term (current) drug therapy: Secondary | ICD-10-CM | POA: Insufficient documentation

## 2018-09-30 DIAGNOSIS — C50411 Malignant neoplasm of upper-outer quadrant of right female breast: Secondary | ICD-10-CM | POA: Diagnosis not present

## 2018-09-30 DIAGNOSIS — Z170 Estrogen receptor positive status [ER+]: Secondary | ICD-10-CM | POA: Insufficient documentation

## 2018-09-30 DIAGNOSIS — E538 Deficiency of other specified B group vitamins: Secondary | ICD-10-CM | POA: Diagnosis not present

## 2018-09-30 MED ORDER — CYANOCOBALAMIN 1000 MCG/ML IJ SOLN
1000.0000 ug | Freq: Once | INTRAMUSCULAR | Status: AC
  Administered 2018-09-30: 1000 ug via INTRAMUSCULAR

## 2018-09-30 MED ORDER — CYANOCOBALAMIN 1000 MCG/ML IJ SOLN
INTRAMUSCULAR | Status: AC
  Filled 2018-09-30: qty 1

## 2018-09-30 NOTE — Progress Notes (Signed)
Tamara [name] presents today for injection per MD orders. B12  administered IM in right deltoid. Administration without incident. Patient tolerated well. MAR reviewed. VSS.    No complaints at this time. Discharged from clinic ambulatory. F/U with [facility] as scheduled.

## 2018-09-30 NOTE — Patient Instructions (Signed)
[facility] at [facility]  Discharge Instructions:   _______________________________________________________________  Thank you for choosing [facility] at [facility] to provide your oncology and hematology care.  To afford each patient quality time with our providers, please arrive at least 15 minutes before your scheduled appointment.  You need to re-schedule your appointment if you arrive 10 or more minutes late.  We strive to give you quality time with our providers, and arriving late affects you and other patients whose appointments are after yours.  Also, if you no show three or more times for appointments you may be dismissed from the clinic.  Again, thank you for choosing [facility] at [facility]. Our hope is that these requests will allow you access to exceptional care and in a timely manner. _______________________________________________________________  If you have questions after your visit, please contact our office at [phone] between the hours of 8:30 a.m. and 5:00 p.m. Voicemails left after 4:30 p.m. will not be returned until the following business day. _______________________________________________________________  For prescription refill requests, have your pharmacy contact our office. _______________________________________________________________  Recommendations made by the consultant and any test results will be sent to your referring physician. _______________________________________________________________ 

## 2018-10-31 ENCOUNTER — Ambulatory Visit (HOSPITAL_COMMUNITY): Payer: PPO

## 2018-11-01 ENCOUNTER — Ambulatory Visit (HOSPITAL_COMMUNITY): Payer: PPO

## 2018-11-28 DIAGNOSIS — G309 Alzheimer's disease, unspecified: Secondary | ICD-10-CM | POA: Diagnosis not present

## 2018-11-28 DIAGNOSIS — J84112 Idiopathic pulmonary fibrosis: Secondary | ICD-10-CM | POA: Diagnosis not present

## 2018-11-28 DIAGNOSIS — Z853 Personal history of malignant neoplasm of breast: Secondary | ICD-10-CM | POA: Diagnosis not present

## 2018-11-30 ENCOUNTER — Inpatient Hospital Stay (HOSPITAL_COMMUNITY): Payer: PPO | Attending: Hematology

## 2018-11-30 ENCOUNTER — Encounter (HOSPITAL_COMMUNITY): Payer: Self-pay

## 2018-11-30 ENCOUNTER — Other Ambulatory Visit: Payer: Self-pay

## 2018-11-30 VITALS — BP 108/56 | HR 77 | Temp 97.9°F | Resp 18

## 2018-11-30 DIAGNOSIS — Z79899 Other long term (current) drug therapy: Secondary | ICD-10-CM | POA: Insufficient documentation

## 2018-11-30 DIAGNOSIS — M810 Age-related osteoporosis without current pathological fracture: Secondary | ICD-10-CM | POA: Insufficient documentation

## 2018-11-30 DIAGNOSIS — E538 Deficiency of other specified B group vitamins: Secondary | ICD-10-CM

## 2018-11-30 DIAGNOSIS — Z170 Estrogen receptor positive status [ER+]: Secondary | ICD-10-CM | POA: Diagnosis not present

## 2018-11-30 DIAGNOSIS — C50411 Malignant neoplasm of upper-outer quadrant of right female breast: Secondary | ICD-10-CM | POA: Insufficient documentation

## 2018-11-30 MED ORDER — CYANOCOBALAMIN 1000 MCG/ML IJ SOLN
1000.0000 ug | Freq: Once | INTRAMUSCULAR | Status: AC
  Administered 2018-11-30: 1000 ug via INTRAMUSCULAR

## 2018-12-09 ENCOUNTER — Other Ambulatory Visit (HOSPITAL_COMMUNITY): Payer: Self-pay | Admitting: Hematology

## 2018-12-09 DIAGNOSIS — C50411 Malignant neoplasm of upper-outer quadrant of right female breast: Secondary | ICD-10-CM

## 2018-12-09 DIAGNOSIS — Z170 Estrogen receptor positive status [ER+]: Secondary | ICD-10-CM

## 2018-12-29 ENCOUNTER — Other Ambulatory Visit: Payer: Self-pay

## 2018-12-30 ENCOUNTER — Encounter (HOSPITAL_COMMUNITY): Payer: Self-pay

## 2018-12-30 ENCOUNTER — Inpatient Hospital Stay (HOSPITAL_COMMUNITY): Payer: PPO | Attending: Hematology

## 2018-12-30 VITALS — BP 118/58 | HR 78 | Temp 98.7°F | Resp 18

## 2018-12-30 DIAGNOSIS — Z170 Estrogen receptor positive status [ER+]: Secondary | ICD-10-CM | POA: Insufficient documentation

## 2018-12-30 DIAGNOSIS — E538 Deficiency of other specified B group vitamins: Secondary | ICD-10-CM | POA: Insufficient documentation

## 2018-12-30 DIAGNOSIS — Z79811 Long term (current) use of aromatase inhibitors: Secondary | ICD-10-CM | POA: Diagnosis not present

## 2018-12-30 DIAGNOSIS — C50411 Malignant neoplasm of upper-outer quadrant of right female breast: Secondary | ICD-10-CM | POA: Diagnosis not present

## 2018-12-30 MED ORDER — CYANOCOBALAMIN 1000 MCG/ML IJ SOLN
1000.0000 ug | Freq: Once | INTRAMUSCULAR | Status: AC
  Administered 2018-12-30: 1000 ug via INTRAMUSCULAR
  Filled 2018-12-30: qty 1

## 2018-12-30 NOTE — Progress Notes (Signed)
Jeanine T Wittler tolerated Vit B12 injection well without complaints or incident. VSS Pt discharged self ambulatory in satisfactory condition

## 2018-12-30 NOTE — Patient Instructions (Signed)
[facility] at [facility] Discharge Instructions  Received Vit B12 injection today. Follow-up as scheduled. Call clinic for any questions or concerns   Thank you for choosing [facility] at [facility] to provide your oncology and hematology care.  To afford each patient quality time with our provider, please arrive at least 15 minutes before your scheduled appointment time.   If you have a lab appointment with the Cancer Center please come in thru the  Main Entrance and check in at the main information desk  You need to re-schedule your appointment should you arrive 10 or more minutes late.  We strive to give you quality time with our providers, and arriving late affects you and other patients whose appointments are after yours.  Also, if you no show three or more times for appointments you may be dismissed from the clinic at the providers discretion.     Again, thank you for choosing [facility].  Our hope is that these requests will decrease the amount of time that you wait before being seen by our physicians.       _____________________________________________________________  Should you have questions after your visit to  Cancer Center, please contact our office at [phone] between the hours of 8:00 a.m. and 4:30 p.m.  Voicemails left after 4:00 p.m. will not be returned until the following business day.  For prescription refill requests, have your pharmacy contact our office and allow 72 hours.    Cancer Center Support Programs:   > Cancer Support Group  2nd Tuesday of the month 1pm-2pm, Journey Room   

## 2019-01-30 ENCOUNTER — Inpatient Hospital Stay (HOSPITAL_COMMUNITY): Payer: PPO | Attending: Hematology

## 2019-01-30 ENCOUNTER — Encounter (HOSPITAL_COMMUNITY): Payer: Self-pay

## 2019-01-30 ENCOUNTER — Other Ambulatory Visit: Payer: Self-pay

## 2019-01-30 VITALS — BP 112/68 | HR 69 | Temp 98.7°F | Resp 18

## 2019-01-30 DIAGNOSIS — Z170 Estrogen receptor positive status [ER+]: Secondary | ICD-10-CM | POA: Insufficient documentation

## 2019-01-30 DIAGNOSIS — C50411 Malignant neoplasm of upper-outer quadrant of right female breast: Secondary | ICD-10-CM | POA: Diagnosis not present

## 2019-01-30 DIAGNOSIS — E538 Deficiency of other specified B group vitamins: Secondary | ICD-10-CM

## 2019-01-30 DIAGNOSIS — Z79811 Long term (current) use of aromatase inhibitors: Secondary | ICD-10-CM | POA: Insufficient documentation

## 2019-01-30 MED ORDER — CYANOCOBALAMIN 1000 MCG/ML IJ SOLN
1000.0000 ug | Freq: Once | INTRAMUSCULAR | Status: AC
  Administered 2019-01-30: 1000 ug via INTRAMUSCULAR

## 2019-01-30 MED ORDER — CYANOCOBALAMIN 1000 MCG/ML IJ SOLN
INTRAMUSCULAR | Status: AC
  Filled 2019-01-30: qty 1

## 2019-01-30 NOTE — Progress Notes (Signed)
Patient tolerated injection with no complaints voiced.  Site clean and dry with no bruising or swelling noted at site.  Band aid applied.  Vss with discharge and left ambulatory with no s/s of distress noted.  

## 2019-02-22 ENCOUNTER — Other Ambulatory Visit (HOSPITAL_COMMUNITY): Payer: PPO

## 2019-02-22 ENCOUNTER — Ambulatory Visit (HOSPITAL_COMMUNITY): Payer: PPO

## 2019-02-24 ENCOUNTER — Inpatient Hospital Stay (HOSPITAL_COMMUNITY): Payer: PPO | Attending: Hematology

## 2019-02-24 ENCOUNTER — Other Ambulatory Visit: Payer: Self-pay

## 2019-02-24 ENCOUNTER — Ambulatory Visit (HOSPITAL_COMMUNITY): Payer: PPO

## 2019-02-24 DIAGNOSIS — C50411 Malignant neoplasm of upper-outer quadrant of right female breast: Secondary | ICD-10-CM

## 2019-02-24 DIAGNOSIS — M810 Age-related osteoporosis without current pathological fracture: Secondary | ICD-10-CM | POA: Insufficient documentation

## 2019-02-24 DIAGNOSIS — Z853 Personal history of malignant neoplasm of breast: Secondary | ICD-10-CM | POA: Insufficient documentation

## 2019-02-24 DIAGNOSIS — E538 Deficiency of other specified B group vitamins: Secondary | ICD-10-CM | POA: Diagnosis not present

## 2019-02-24 DIAGNOSIS — D509 Iron deficiency anemia, unspecified: Secondary | ICD-10-CM | POA: Insufficient documentation

## 2019-02-24 DIAGNOSIS — Z170 Estrogen receptor positive status [ER+]: Secondary | ICD-10-CM

## 2019-02-24 LAB — CBC WITH DIFFERENTIAL/PLATELET
Abs Immature Granulocytes: 0.01 10*3/uL (ref 0.00–0.07)
Basophils Absolute: 0 10*3/uL (ref 0.0–0.1)
Basophils Relative: 0 %
Eosinophils Absolute: 0.1 10*3/uL (ref 0.0–0.5)
Eosinophils Relative: 1 %
HCT: 38.3 % (ref 36.0–46.0)
Hemoglobin: 12.3 g/dL (ref 12.0–15.0)
Immature Granulocytes: 0 %
Lymphocytes Relative: 17 %
Lymphs Abs: 1 10*3/uL (ref 0.7–4.0)
MCH: 31 pg (ref 26.0–34.0)
MCHC: 32.1 g/dL (ref 30.0–36.0)
MCV: 96.5 fL (ref 80.0–100.0)
Monocytes Absolute: 0.8 10*3/uL (ref 0.1–1.0)
Monocytes Relative: 13 %
Neutro Abs: 3.8 10*3/uL (ref 1.7–7.7)
Neutrophils Relative %: 69 %
Platelets: 192 10*3/uL (ref 150–400)
RBC: 3.97 MIL/uL (ref 3.87–5.11)
RDW: 13.1 % (ref 11.5–15.5)
WBC: 5.6 10*3/uL (ref 4.0–10.5)
nRBC: 0 % (ref 0.0–0.2)

## 2019-02-24 LAB — FERRITIN: Ferritin: 174 ng/mL (ref 11–307)

## 2019-02-24 LAB — IRON AND TIBC
Iron: 59 ug/dL (ref 28–170)
Saturation Ratios: 20 % (ref 10.4–31.8)
TIBC: 294 ug/dL (ref 250–450)
UIBC: 235 ug/dL

## 2019-02-24 LAB — VITAMIN B12: Vitamin B-12: 281 pg/mL (ref 180–914)

## 2019-02-24 LAB — COMPREHENSIVE METABOLIC PANEL
ALT: 15 U/L (ref 0–44)
AST: 18 U/L (ref 15–41)
Albumin: 4.1 g/dL (ref 3.5–5.0)
Alkaline Phosphatase: 67 U/L (ref 38–126)
Anion gap: 7 (ref 5–15)
BUN: 21 mg/dL (ref 8–23)
CO2: 27 mmol/L (ref 22–32)
Calcium: 8.7 mg/dL — ABNORMAL LOW (ref 8.9–10.3)
Chloride: 106 mmol/L (ref 98–111)
Creatinine, Ser: 0.82 mg/dL (ref 0.44–1.00)
GFR calc Af Amer: >60 mL/min (ref >60)
GFR calc non Af Amer: >60 mL/min (ref >60)
Glucose, Bld: 92 mg/dL (ref 70–99)
Potassium: 3.9 mmol/L (ref 3.5–5.1)
Sodium: 140 mmol/L (ref 135–145)
Total Bilirubin: 0.6 mg/dL (ref 0.3–1.2)
Total Protein: 7.2 g/dL (ref 6.5–8.1)

## 2019-02-24 LAB — FOLATE: Folate: 33 ng/mL (ref >5.9)

## 2019-02-25 LAB — VITAMIN D 25 HYDROXY (VIT D DEFICIENCY, FRACTURES): Vit D, 25-Hydroxy: 44.7 ng/mL (ref 30.0–100.0)

## 2019-03-01 ENCOUNTER — Other Ambulatory Visit: Payer: Self-pay

## 2019-03-01 ENCOUNTER — Ambulatory Visit (HOSPITAL_COMMUNITY): Payer: PPO

## 2019-03-01 ENCOUNTER — Ambulatory Visit (HOSPITAL_COMMUNITY): Payer: PPO | Admitting: Hematology

## 2019-03-01 ENCOUNTER — Inpatient Hospital Stay (HOSPITAL_COMMUNITY): Payer: PPO

## 2019-03-01 ENCOUNTER — Inpatient Hospital Stay (HOSPITAL_BASED_OUTPATIENT_CLINIC_OR_DEPARTMENT_OTHER): Payer: PPO | Admitting: Nurse Practitioner

## 2019-03-01 VITALS — BP 125/63 | HR 82 | Temp 97.0°F | Resp 18 | Wt 146.0 lb

## 2019-03-01 DIAGNOSIS — M810 Age-related osteoporosis without current pathological fracture: Secondary | ICD-10-CM

## 2019-03-01 DIAGNOSIS — C50411 Malignant neoplasm of upper-outer quadrant of right female breast: Secondary | ICD-10-CM

## 2019-03-01 DIAGNOSIS — Z853 Personal history of malignant neoplasm of breast: Secondary | ICD-10-CM | POA: Diagnosis not present

## 2019-03-01 DIAGNOSIS — E538 Deficiency of other specified B group vitamins: Secondary | ICD-10-CM

## 2019-03-01 DIAGNOSIS — Z170 Estrogen receptor positive status [ER+]: Secondary | ICD-10-CM | POA: Diagnosis not present

## 2019-03-01 MED ORDER — DENOSUMAB 60 MG/ML ~~LOC~~ SOSY
60.0000 mg | PREFILLED_SYRINGE | Freq: Once | SUBCUTANEOUS | Status: AC
  Administered 2019-03-01: 10:00:00 60 mg via SUBCUTANEOUS

## 2019-03-01 MED ORDER — CYANOCOBALAMIN 1000 MCG/ML IJ SOLN
1000.0000 ug | Freq: Once | INTRAMUSCULAR | Status: AC
  Administered 2019-03-01: 1000 ug via INTRAMUSCULAR

## 2019-03-01 MED ORDER — CYANOCOBALAMIN 1000 MCG/ML IJ SOLN
INTRAMUSCULAR | Status: AC
  Filled 2019-03-01: qty 1

## 2019-03-01 MED ORDER — DENOSUMAB 60 MG/ML ~~LOC~~ SOSY
PREFILLED_SYRINGE | SUBCUTANEOUS | Status: AC
  Filled 2019-03-01: qty 1

## 2019-03-01 NOTE — Patient Instructions (Signed)
[place] at [facility] Discharge Instructions  Follow up in 1 year with repeat mammogram and labs   Thank you for choosing [place] at [facility] to provide your oncology and hematology care.  To afford each patient quality time with our provider, please arrive at least 15 minutes before your scheduled appointment time.   If you have a lab appointment with the [facility] please come in thru the  Main Entrance and check in at the main information desk  You need to re-schedule your appointment should you arrive 10 or more minutes late.  We strive to give you quality time with our providers, and arriving late affects you and other patients whose appointments are after yours.  Also, if you no show three or more times for appointments you may be dismissed from the clinic at the providers discretion.     Again, thank you for choosing [facility].  Our hope is that these requests will decrease the amount of time that you wait before being seen by our physicians.       _____________________________________________________________  Should you have questions after your visit to [facility], please contact our office at [phone] between the hours of 8:00 a.m. and 4:30 p.m.  Voicemails left after 4:00 p.m. will not be returned until the following business day.  For prescription refill requests, have your pharmacy contact our office and allow 72 hours.    Cancer Center Support Programs:   > Cancer Support Group  2nd Tuesday of the month 1pm-2pm, Journey Room

## 2019-03-01 NOTE — Progress Notes (Signed)
B12 injection and Prolia injection given per orders. See MAR for details. Consent obtained for Prolia.   Patient tolerated it well without problems. Vitals stable and discharged home from clinic ambulatory. Follow up as scheduled.

## 2019-03-01 NOTE — Addendum Note (Signed)
Addended by: Glennie Isle on: 03/01/2019 10:19 AM   Modules accepted: Orders

## 2019-03-01 NOTE — Progress Notes (Signed)
[address] [place], [place] [address]   CLINIC:  Medical Oncology/Hematology  PCP:  [name], MD [address] 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  Her2 negative, ER 100%, PR 85%, Ki-67 17%.   03/08/2013 Surgery   Right lumpectomy with sentinel node biopsy revealing adjacent DCIS (intermediate grade).  Lymph node is negative for disease.  T1b N0.   03/24/2013 Imaging   Bone density-  Osteoporosis   04/26/2013 Survivorship   Prolia every 6 months   04/28/2013 -  Chemotherapy   Aromasin daily.  She will take for 5 years.   04/08/2015 Imaging   Bone density- There has been a statistically significant IMPROVEMENT compared to patient's previous exam of 03/24/2013.   11/14/2015 Pathology Results   BCI- low risk of late recurrence (year 5-10) at 3.7%, low likelihood of benefit from extended endocrine therapy, low risk of overall recurrence (years 0-10) 6.6%.      CANCER STAGING: Cancer Staging Malignant neoplasm of upper-outer quadrant of RIGHT female breast ([place]) Staging form: Breast, AJCC 7th Edition - Clinical: Stage IA (T1b, N0, cM0) - Signed by B[facility], PA-C on 04/25/2013    INTERVAL HISTORY:  Ms. Hartsock 85 y.o. female returns for routine follow-up for breast cancer.  She reports she has been feeling well since her last visit she has no complaints at this time. Denies any nausea, vomiting, or diarrhea. Denies any new pains. Had not noticed any recent bleeding such as epistaxis, hematuria or hematochezia. Denies recent chest pain on exertion, shortness of breath on minimal exertion, pre-syncopal episodes, or palpitations. Denies any  numbness or tingling in hands or feet. Denies any recent fevers, infections, or recent hospitalizations. Patient reports appetite at 100% and energy level at 100%.  She is eating well maintaining her weight at this time.    REVIEW OF SYSTEMS:  Review of Systems  All other systems reviewed and are negative.    PAST MEDICAL/SURGICAL HISTORY:  Past Medical History:  Diagnosis Date  . B12 deficiency 06/08/2016  . Breast cancer ([place])   . Breast cancer, right breast ([place]) 04/25/2013  . Cancer ([place])   . Claustrophobia   . Claustrophobia   . Dementia ([place])   . Hypertension   . Iron deficiency anemia 12/15/2016  . Malignant neoplasm of upper-outer quadrant of RIGHT female breast ([place]) 04/25/2013   Stage IA (T1b, N0, M0) invasive mucinous breast carcinoma, S/P right breast lumpectomy by Dr. Arnoldo Morale on 03/08/2013 with 1 negative sentinel node. Cancer was identified as ER+ 100%, PR+ 85%, Her2 negative, and Ki-67 marker at 17%. Oncotype Dx score of 20 with 13% 10 year risk of distant recurrence and absolute benefit of chemotherapy at 10 years in this risk category is negligible and less than 1%.  . Osteoporosis    Past Surgical History:  Procedure Laterality Date  . ABDOMINAL HYSTERECTOMY    . CATARACT EXTRACTION, BILATERAL    . PARTIAL MASTECTOMY WITH NEEDLE LOCALIZATION AND AXILLARY SENTINEL LYMPH NODE BX Right 03/08/2013   Procedure: PARTIAL MASTECTOMY WITH NEEDLE LOCALIZATION AND AXILLARY SENTINEL LYMPH NODE BX;  Surgeon: Jamesetta So, MD;  Location: [facility];  Service: General;  Laterality: Right;  Sentinel Node Bx @ 7:30am  Needle Loc @ 8:00am     SOCIAL HISTORY:  Social History   Socioeconomic History  . Marital status: Widowed    Spouse name: Not on file  . Number of children: Not on file  . Years of education: Not on file  . Highest education level: Not on file  Occupational History  . Not on file  Social Needs  . Financial resource strain: Not on file  . Food insecurity     Worry: Not on file    Inability: Not on file  . Transportation needs    Medical: Not on file    Non-medical: Not on file  Tobacco Use  . Smoking status: Former Smoker    Packs/day: 0.50    Years: 40.00    Pack years: 20.00    Types: Cigarettes    Quit date: 03/03/1967    Years since quitting: 52.0  . Smokeless tobacco: Never Used  Substance and Sexual Activity  . Alcohol use: No  . Drug use: No  . Sexual activity: Yes    Birth control/protection: Surgical  Lifestyle  . Physical activity    Days per week: Not on file    Minutes per session: Not on file  . Stress: Not on file  Relationships  . Social Herbalist on phone: Not on file    Gets together: Not on file    Attends religious service: Not on file    Active member of club or organization: Not on file    Attends meetings of clubs or organizations: Not on file    Relationship status: Not on file  . Intimate partner violence    Fear of current or ex partner: Not on file    Emotionally abused: Not on file    Physically abused: Not on file    Forced sexual activity: Not on file  Other Topics Concern  . Not on file  Social History Narrative  . Not on file    FAMILY HISTORY:  Family History  Problem Relation Age of Onset  . Diabetes Mother   . Cancer Father   . Cancer Sister   . Cancer Brother     CURRENT MEDICATIONS:  Outpatient Encounter Medications as of 03/01/2019  Medication Sig  . amLODipine (NORVASC) 5 MG tablet Take 5 mg by mouth daily.  . calcium-vitamin D (OSCAL WITH D) 500-200 MG-UNIT per tablet Take 2 tablets by mouth daily with breakfast.  . donepezil (ARICEPT) 10 MG tablet   . exemestane (AROMASIN) 25 MG tablet TAKE 1 TABLET BY MOUTH ONCE DAILY AFTER BREAKFAST  . HYDROcodone-acetaminophen (NORCO/VICODIN) 5-325 MG tablet Take 1 tablet by mouth every 4 (four) hours as needed.  Marland Kitchen losartan (COZAAR) 25 MG tablet   . olmesartan (BENICAR) 20 MG tablet   . lidocaine (LIDODERM) 5 % Place 1 patch  onto the skin daily. Remove & Discard patch within 12 hours or as directed by MD (Patient not taking: Reported on 03/01/2019)   No facility-administered encounter medications on file as of 03/01/2019.     ALLERGIES:  No Known Allergies   PHYSICAL EXAM:  ECOG Performance status: 1  Vitals:   03/01/19 0947  BP: 125/63  Pulse: 82  Resp: 18  Temp: (!) 97 F (36.1 C)  SpO2: 100%   Filed Weights   03/01/19 0947  Weight: 146 lb (66.2 kg)    Physical Exam Constitutional:      Appearance: Normal appearance. She is normal weight.  Cardiovascular:  Rate and Rhythm: Normal rate and regular rhythm.     Heart sounds: Normal heart sounds.  Pulmonary:     Effort: Pulmonary effort is normal.     Breath sounds: Normal breath sounds.  Abdominal:     General: Bowel sounds are normal.     Palpations: Abdomen is soft.  Musculoskeletal: Normal range of motion.  Skin:    General: Skin is warm and dry.  Neurological:     Mental Status: She is alert and oriented to person, place, and time. Mental status is at baseline.  Psychiatric:        Mood and Affect: Mood normal.        Behavior: Behavior normal.        Thought Content: Thought content normal.        Judgment: Judgment normal.   Breast: No palpable masses, no skin changes or nipple discharge, no adenopathy.   LABORATORY DATA:  I have reviewed the labs as listed.  CBC    Component Value Date/Time   WBC 5.6 02/24/2019 1000   RBC 3.97 02/24/2019 1000   HGB 12.3 02/24/2019 1000   HCT 38.3 02/24/2019 1000   PLT 192 02/24/2019 1000   MCV 96.5 02/24/2019 1000   MCH 31.0 02/24/2019 1000   MCHC 32.1 02/24/2019 1000   RDW 13.1 02/24/2019 1000   LYMPHSABS 1.0 02/24/2019 1000   MONOABS 0.8 02/24/2019 1000   EOSABS 0.1 02/24/2019 1000   BASOSABS 0.0 02/24/2019 1000   CMP Latest Ref Rng & Units 02/24/2019 08/23/2018 07/21/2018  Glucose 70 - 99 mg/dL 92 109(H) 143(H)  BUN 8 - 23 mg/dL _0 Creatinine 0.44 - 1.00 mg/dL 0.82  0.86 0.97  Sodium 135 - 145 mmol/L 140 139 139  Potassium 3.5 - 5.1 mmol/L 3.9 3.9 3.6  Chloride 98 - 111 mmol/L 106 105 105  CO2 22 - 32 mmol/L _1 Calcium 8.9 - 10.3 mg/dL 8.7(L) 8.9 9.0  Total Protein 6.5 - 8.1 g/dL 7.2 7.2 -  Total Bilirubin 0.3 - 1.2 mg/dL 0.6 0.7 -  Alkaline Phos 38 - 126 U/L 67 73 -  AST 15 - 41 U/L 18 16 -  ALT 0 - 44 U/L 15 11 -   I personally performed a face-to-face visit.  All questions were answered to patient's stated satisfaction. Encouraged patient to call with any new concerns or questions before his next visit to the cancer center and we can certain see him sooner, if needed.     ASSESSMENT & PLAN:   Malignant neoplasm of upper-outer quadrant of RIGHT female breast ([place]) 1.  Stage Ia right breast IDC: -Status post lumpectomy and lymph node biopsy on 03/08/2013, which showed invasive mucinous carcinoma, 0.6 cm, grade 1, margins negative, ER/PR positive, HER-2 negative, Ki-67 17% -Oncotype DX shows recurrence score of 20, average rate of distant recurrence of 13% - Adjuvant chemotherapy was not offered.  Patient declined radiation therapy. -Aromasin was started in October 2014, continued till October 2019. -BCI showed low risk of recurrence and low benefit from extending adjuvant endocrine therapy. -Today's physical examination showed right upper outer quadrant lumpectomy scar well-healed.  No palpable masses in either breast and no axillary adenopathy. - Her last mammogram was on 02/15/2018 was B RADS category 2 benign.  Her next mammogram is scheduled tomorrow on 03/02/2019.  She reports she is going to cancel that appointment and reschedule it for next week. -We will see her back in 1 year with  repeat mammogram and repeat labs.  2.  Osteoporosis: -DEXA scan on 04/15/2017 showed T score of -2.5. -She is receiving Prolia every 6 months, last injection on today 03/01/2019 -She is also taking calcium and vitamin D daily - We will repeat her DEXA scan  with her next visit.  3.  Vitamin B12 deficiency: - She is receiving vitamin B12 injections monthly until May 2019, where she stopped coming to get the injections.. - Her last B12 level was 171 hence we recommended her to start back on her vitamin B12 injections monthly. - Labs on 02/24/2019 showed her B12 level has come up to 281. -We will continue monthly injections.      Orders placed this encounter:  Orders Placed This Encounter  Procedures  . DG Bone Density  . Lactate dehydrogenase  . CBC with Differential/Platelet  . Comprehensive metabolic panel  . Ferritin  . Iron and TIBC  . Vitamin B12  . VITAMIN D 25 Hydroxy (Vit-D Deficiency, Fractures)  . Folate      Francene Finders, FNP-C [place] [phone]

## 2019-03-01 NOTE — Assessment & Plan Note (Addendum)
1.  Stage Ia right breast IDC: -Status post lumpectomy and lymph node biopsy on 03/08/2013, which showed invasive mucinous carcinoma, 0.6 cm, grade 1, margins negative, ER/PR positive, HER-2 negative, Ki-67 17% -Oncotype DX shows recurrence score of 20, average rate of distant recurrence of 13% - Adjuvant chemotherapy was not offered.  Patient declined radiation therapy. -Aromasin was started in October 2014, continued till October 2019. -BCI showed low risk of recurrence and low benefit from extending adjuvant endocrine therapy. -Today's physical examination showed right upper outer quadrant lumpectomy scar well-healed.  No palpable masses in either breast and no axillary adenopathy. - Her last mammogram was on 02/15/2018 was B RADS category 2 benign.  Her next mammogram is scheduled tomorrow on 03/02/2019.  She reports she is going to cancel that appointment and reschedule it for next week. -We will see her back in 1 year with repeat mammogram and repeat labs.  2.  Osteoporosis: -DEXA scan on 04/15/2017 showed T score of -2.5. -She is receiving Prolia every 6 months, last injection on today 03/01/2019 -She is also taking calcium and vitamin D daily - We will repeat her DEXA scan with her next visit.  3.  Vitamin B12 deficiency: - She is receiving vitamin B12 injections monthly until May 2019, where she stopped coming to get the injections.. - Her last B12 level was 171 hence we recommended her to start back on her vitamin B12 injections monthly. - Labs on 02/24/2019 showed her B12 level has come up to 281. -We will continue monthly injections.

## 2019-03-02 ENCOUNTER — Inpatient Hospital Stay (HOSPITAL_COMMUNITY): Admission: RE | Admit: 2019-03-02 | Payer: PPO | Source: Ambulatory Visit

## 2019-03-08 ENCOUNTER — Ambulatory Visit (HOSPITAL_COMMUNITY)
Admission: RE | Admit: 2019-03-08 | Discharge: 2019-03-08 | Disposition: A | Discharge status: Home / Self Care | Payer: PPO | Source: Ambulatory Visit | Attending: Nurse Practitioner | Admitting: Nurse Practitioner

## 2019-03-08 ENCOUNTER — Other Ambulatory Visit: Payer: Self-pay

## 2019-03-08 DIAGNOSIS — Z1231 Encounter for screening mammogram for malignant neoplasm of breast: Secondary | ICD-10-CM | POA: Insufficient documentation

## 2019-03-31 ENCOUNTER — Ambulatory Visit (HOSPITAL_COMMUNITY): Payer: PPO

## 2019-04-06 ENCOUNTER — Inpatient Hospital Stay (HOSPITAL_COMMUNITY): Payer: PPO | Attending: Hematology

## 2019-04-06 ENCOUNTER — Other Ambulatory Visit: Payer: Self-pay

## 2019-04-06 VITALS — BP 120/65 | HR 83 | Temp 97.1°F | Resp 18

## 2019-04-06 DIAGNOSIS — E538 Deficiency of other specified B group vitamins: Secondary | ICD-10-CM | POA: Insufficient documentation

## 2019-04-06 MED ORDER — CYANOCOBALAMIN 1000 MCG/ML IJ SOLN
1000.0000 ug | Freq: Once | INTRAMUSCULAR | Status: AC
  Administered 2019-04-06: 1000 ug via INTRAMUSCULAR

## 2019-04-06 MED ORDER — CYANOCOBALAMIN 1000 MCG/ML IJ SOLN
INTRAMUSCULAR | Status: AC
  Filled 2019-04-06: qty 1

## 2019-04-06 NOTE — Patient Instructions (Signed)
[place] at [facility]  Discharge Instructions:  You received your B12 injection today. _______________________________________________________________  Thank you for choosing [place] at [facility] to provide your oncology and hematology care.  To afford each patient quality time with our providers, please arrive at least 15 minutes before your scheduled appointment.  You need to re-schedule your appointment if you arrive 10 or more minutes late.  We strive to give you quality time with our providers, and arriving late affects you and other patients whose appointments are after yours.  Also, if you no show three or more times for appointments you may be dismissed from the clinic.  Again, thank you for choosing [place] at [place] hope is that these requests will allow you access to exceptional care and in a timely manner. _______________________________________________________________  If you have questions after your visit, please contact our office at [phone] between the hours of 8:30 a.m. and 5:00 p.m. Voicemails left after 4:30 p.m. will not be returned until the following business day. _______________________________________________________________  For prescription refill requests, have your pharmacy contact our office. _______________________________________________________________  Recommendations made by the consultant and any test results will be sent to your referring physician. _______________________________________________________________

## 2019-04-06 NOTE — Progress Notes (Signed)
Tamara [name] presents today for B12 injection. VSS. Injection tolerated without incident or complaint. See MAR for details. Patient discharged in satisfactory condition with follow up instructions.

## 2019-04-10 DIAGNOSIS — Z79899 Other long term (current) drug therapy: Secondary | ICD-10-CM | POA: Diagnosis not present

## 2019-04-10 DIAGNOSIS — M810 Age-related osteoporosis without current pathological fracture: Secondary | ICD-10-CM | POA: Diagnosis not present

## 2019-04-10 DIAGNOSIS — D510 Vitamin B12 deficiency anemia due to intrinsic factor deficiency: Secondary | ICD-10-CM | POA: Diagnosis not present

## 2019-04-10 DIAGNOSIS — G309 Alzheimer's disease, unspecified: Secondary | ICD-10-CM | POA: Diagnosis not present

## 2019-04-10 DIAGNOSIS — E1129 Type 2 diabetes mellitus with other diabetic kidney complication: Secondary | ICD-10-CM | POA: Diagnosis not present

## 2019-04-10 DIAGNOSIS — I10 Essential (primary) hypertension: Secondary | ICD-10-CM | POA: Diagnosis not present

## 2019-04-17 DIAGNOSIS — G309 Alzheimer's disease, unspecified: Secondary | ICD-10-CM | POA: Diagnosis not present

## 2019-04-17 DIAGNOSIS — E1129 Type 2 diabetes mellitus with other diabetic kidney complication: Secondary | ICD-10-CM | POA: Diagnosis not present

## 2019-04-17 DIAGNOSIS — I10 Essential (primary) hypertension: Secondary | ICD-10-CM | POA: Diagnosis not present

## 2019-04-17 DIAGNOSIS — Z853 Personal history of malignant neoplasm of breast: Secondary | ICD-10-CM | POA: Diagnosis not present

## 2019-04-17 DIAGNOSIS — J84112 Idiopathic pulmonary fibrosis: Secondary | ICD-10-CM | POA: Diagnosis not present

## 2019-04-17 DIAGNOSIS — M810 Age-related osteoporosis without current pathological fracture: Secondary | ICD-10-CM | POA: Diagnosis not present

## 2019-05-01 ENCOUNTER — Ambulatory Visit (HOSPITAL_COMMUNITY): Payer: PPO

## 2019-05-08 ENCOUNTER — Inpatient Hospital Stay (HOSPITAL_COMMUNITY): Payer: PPO | Attending: Hematology

## 2019-05-08 ENCOUNTER — Other Ambulatory Visit: Payer: Self-pay

## 2019-05-08 ENCOUNTER — Encounter (HOSPITAL_COMMUNITY): Payer: Self-pay

## 2019-05-08 VITALS — BP 121/67 | HR 79 | Temp 97.6°F | Resp 18

## 2019-05-08 DIAGNOSIS — E538 Deficiency of other specified B group vitamins: Secondary | ICD-10-CM | POA: Insufficient documentation

## 2019-05-08 MED ORDER — CYANOCOBALAMIN 1000 MCG/ML IJ SOLN
1000.0000 ug | Freq: Once | INTRAMUSCULAR | Status: AC
  Administered 2019-05-08: 12:00:00 1000 ug via INTRAMUSCULAR

## 2019-05-08 MED ORDER — CYANOCOBALAMIN 1000 MCG/ML IJ SOLN
INTRAMUSCULAR | Status: AC
  Filled 2019-05-08: qty 1

## 2019-05-08 NOTE — Patient Instructions (Signed)
[facility] at [facility] Discharge Instructions  Received Vit B12 injection today. Follow-up as scheduled. Call clinic for any questions or concerns   Thank you for choosing [facility] at [facility] to provide your oncology and hematology care.  To afford each patient quality time with our provider, please arrive at least 15 minutes before your scheduled appointment time.   If you have a lab appointment with the Cancer Center please come in thru the Main Entrance and check in at the main information desk.  You need to re-schedule your appointment should you arrive 10 or more minutes late.  We strive to give you quality time with our providers, and arriving late affects you and other patients whose appointments are after yours.  Also, if you no show three or more times for appointments you may be dismissed from the clinic at the providers discretion.     Again, thank you for choosing [facility].  Our hope is that these requests will decrease the amount of time that you wait before being seen by our physicians.       _____________________________________________________________  Should you have questions after your visit to  Cancer Center, please contact our office at [phone] between the hours of 8:00 a.m. and 4:30 p.m.  Voicemails left after 4:00 p.m. will not be returned until the following business day.  For prescription refill requests, have your pharmacy contact our office and allow 72 hours.    Due to Covid, you will need to wear a mask upon entering the hospital. If you do not have a mask, a mask will be given to you at the Main Entrance upon arrival. For doctor visits, patients may have 1 support person with them. For treatment visits, patients can not have anyone with them due to social distancing guidelines and our immunocompromised population.     

## 2019-05-08 NOTE — Progress Notes (Signed)
Tamara [name] tolerated Vit B12 injection well without complaints or incident. VSS Pt discharged self ambulatory in satisfactory condition 

## 2019-06-01 ENCOUNTER — Ambulatory Visit (HOSPITAL_COMMUNITY): Payer: PPO

## 2019-06-30 ENCOUNTER — Inpatient Hospital Stay (HOSPITAL_COMMUNITY): Payer: PPO | Attending: Hematology

## 2019-06-30 ENCOUNTER — Encounter (HOSPITAL_COMMUNITY): Payer: Self-pay

## 2019-06-30 ENCOUNTER — Other Ambulatory Visit: Payer: Self-pay

## 2019-06-30 VITALS — BP 135/64 | HR 73 | Temp 97.9°F | Resp 18

## 2019-06-30 DIAGNOSIS — E538 Deficiency of other specified B group vitamins: Secondary | ICD-10-CM | POA: Diagnosis not present

## 2019-06-30 MED ORDER — CYANOCOBALAMIN 1000 MCG/ML IJ SOLN
1000.0000 ug | Freq: Once | INTRAMUSCULAR | Status: AC
  Administered 2019-06-30: 11:00:00 1000 ug via INTRAMUSCULAR
  Filled 2019-06-30: qty 1

## 2019-06-30 NOTE — Patient Instructions (Signed)
[facility] at [facility] Discharge Instructions  Received Vit B12 injection today. Follow-up as scheduled. Call clinic for any questions or concerns   Thank you for choosing [facility] at [facility] to provide your oncology and hematology care.  To afford each patient quality time with our provider, please arrive at least 15 minutes before your scheduled appointment time.   If you have a lab appointment with the Cancer Center please come in thru the Main Entrance and check in at the main information desk.  You need to re-schedule your appointment should you arrive 10 or more minutes late.  We strive to give you quality time with our providers, and arriving late affects you and other patients whose appointments are after yours.  Also, if you no show three or more times for appointments you may be dismissed from the clinic at the providers discretion.     Again, thank you for choosing [facility].  Our hope is that these requests will decrease the amount of time that you wait before being seen by our physicians.       _____________________________________________________________  Should you have questions after your visit to [facility], please contact our office at [phone] between the hours of 8:00 a.m. and 4:30 p.m.  Voicemails left after 4:00 p.m. will not be returned until the following business day.  For prescription refill requests, have your pharmacy contact our office and allow 72 hours.    Due to Covid, you will need to wear a mask upon entering the hospital. If you do not have a mask, a mask will be given to you at the Main Entrance upon arrival. For doctor visits, patients may have 1 support person with them. For treatment visits, patients can not have anyone with them due to social distancing guidelines and our immunocompromised population.     

## 2019-06-30 NOTE — Progress Notes (Signed)
Tamara [name] tolerated Vit B12 injection well without complaints or incident. VSS Pt discharged self ambulatory in satisfactory condition

## 2019-07-31 ENCOUNTER — Other Ambulatory Visit: Payer: Self-pay

## 2019-07-31 ENCOUNTER — Inpatient Hospital Stay (HOSPITAL_COMMUNITY): Payer: PPO | Attending: Hematology

## 2019-07-31 VITALS — BP 129/67 | HR 78 | Temp 97.7°F | Resp 18

## 2019-07-31 DIAGNOSIS — E538 Deficiency of other specified B group vitamins: Secondary | ICD-10-CM | POA: Diagnosis not present

## 2019-07-31 MED ORDER — CYANOCOBALAMIN 1000 MCG/ML IJ SOLN
1000.0000 ug | Freq: Once | INTRAMUSCULAR | Status: AC
  Administered 2019-07-31: 10:00:00 1000 ug via INTRAMUSCULAR
  Filled 2019-07-31: qty 1

## 2019-07-31 NOTE — Patient Instructions (Signed)
[facility] at [facility]  Discharge Instructions:  B12 injection received today. _______________________________________________________________  Thank you for choosing [facility] at [facility] to provide your oncology and hematology care.  To afford each patient quality time with our providers, please arrive at least 15 minutes before your scheduled appointment.  You need to re-schedule your appointment if you arrive 10 or more minutes late.  We strive to give you quality time with our providers, and arriving late affects you and other patients whose appointments are after yours.  Also, if you no show three or more times for appointments you may be dismissed from the clinic.  Again, thank you for choosing [facility] at [facility]. Our hope is that these requests will allow you access to exceptional care and in a timely manner. _______________________________________________________________  If you have questions after your visit, please contact our office at [phone] between the hours of 8:30 a.m. and 5:00 p.m. Voicemails left after 4:30 p.m. will not be returned until the following business day. _______________________________________________________________  For prescription refill requests, have your pharmacy contact our office. _______________________________________________________________  Recommendations made by the consultant and any test results will be sent to your referring physician. _______________________________________________________________ 

## 2019-07-31 NOTE — Progress Notes (Signed)
Tamara [name] presents today for B12 injection. VSS. Injection tolerated without incident or complaint. See MAR for details. Patient discharged in satisfactory condition with follow up instructions.

## 2019-09-05 ENCOUNTER — Inpatient Hospital Stay (HOSPITAL_COMMUNITY): Payer: PPO

## 2019-09-05 ENCOUNTER — Ambulatory Visit (HOSPITAL_COMMUNITY): Payer: PPO

## 2019-09-08 ENCOUNTER — Inpatient Hospital Stay (HOSPITAL_COMMUNITY): Payer: PPO

## 2019-09-08 ENCOUNTER — Ambulatory Visit (HOSPITAL_COMMUNITY): Payer: PPO

## 2019-09-15 ENCOUNTER — Inpatient Hospital Stay (HOSPITAL_COMMUNITY): Payer: PPO | Attending: Hematology

## 2019-09-15 ENCOUNTER — Inpatient Hospital Stay (HOSPITAL_COMMUNITY): Payer: PPO

## 2019-09-15 ENCOUNTER — Encounter (HOSPITAL_COMMUNITY): Payer: Self-pay

## 2019-09-15 ENCOUNTER — Other Ambulatory Visit: Payer: Self-pay

## 2019-09-15 VITALS — BP 130/62 | HR 76 | Temp 97.1°F | Resp 18

## 2019-09-15 DIAGNOSIS — Z853 Personal history of malignant neoplasm of breast: Secondary | ICD-10-CM | POA: Insufficient documentation

## 2019-09-15 DIAGNOSIS — E538 Deficiency of other specified B group vitamins: Secondary | ICD-10-CM

## 2019-09-15 DIAGNOSIS — Z170 Estrogen receptor positive status [ER+]: Secondary | ICD-10-CM

## 2019-09-15 DIAGNOSIS — C50411 Malignant neoplasm of upper-outer quadrant of right female breast: Secondary | ICD-10-CM

## 2019-09-15 LAB — COMPREHENSIVE METABOLIC PANEL
ALT: 13 U/L (ref 0–44)
AST: 16 U/L (ref 15–41)
Albumin: 3.9 g/dL (ref 3.5–5.0)
Alkaline Phosphatase: 67 U/L (ref 38–126)
Anion gap: 10 (ref 5–15)
BUN: 18 mg/dL (ref 8–23)
CO2: 28 mmol/L (ref 22–32)
Calcium: 8.8 mg/dL — ABNORMAL LOW (ref 8.9–10.3)
Chloride: 102 mmol/L (ref 98–111)
Creatinine, Ser: 0.91 mg/dL (ref 0.44–1.00)
GFR calc Af Amer: >60 mL/min (ref >60)
GFR calc non Af Amer: 58 mL/min — ABNORMAL LOW (ref >60)
Glucose, Bld: 111 mg/dL — ABNORMAL HIGH (ref 70–99)
Potassium: 3.8 mmol/L (ref 3.5–5.1)
Sodium: 140 mmol/L (ref 135–145)
Total Bilirubin: 0.9 mg/dL (ref 0.3–1.2)
Total Protein: 7.1 g/dL (ref 6.5–8.1)

## 2019-09-15 MED ORDER — CYANOCOBALAMIN 1000 MCG/ML IJ SOLN
1000.0000 ug | Freq: Once | INTRAMUSCULAR | Status: AC
  Administered 2019-09-15: 1000 ug via INTRAMUSCULAR

## 2019-09-15 MED ORDER — CYANOCOBALAMIN 1000 MCG/ML IJ SOLN
INTRAMUSCULAR | Status: AC
  Filled 2019-09-15: qty 1

## 2019-09-15 NOTE — Progress Notes (Signed)
Tamara [name] tolerated Vit B12 injection well without complaints or incident. Pt rescheduled her Prolia injection for next Tuesday,09/19/19, because she didn't have time to wait on her lab results. VSS Pt discharged self ambulatory in satisfactory condition

## 2019-09-15 NOTE — Patient Instructions (Signed)
[facility] at [facility] Discharge Instructions  Received Vit B12 injection today. Follow-up as scheduled. Call clinic for any questions or concerns   Thank you for choosing [facility] at [facility] to provide your oncology and hematology care.  To afford each patient quality time with our provider, please arrive at least 15 minutes before your scheduled appointment time.   If you have a lab appointment with the Cancer Center please come in thru the Main Entrance and check in at the main information desk.  You need to re-schedule your appointment should you arrive 10 or more minutes late.  We strive to give you quality time with our providers, and arriving late affects you and other patients whose appointments are after yours.  Also, if you no show three or more times for appointments you may be dismissed from the clinic at the providers discretion.     Again, thank you for choosing [facility].  Our hope is that these requests will decrease the amount of time that you wait before being seen by our physicians.       _____________________________________________________________  Should you have questions after your visit to [facility], please contact our office at [phone] between the hours of 8:00 a.m. and 4:30 p.m.  Voicemails left after 4:00 p.m. will not be returned until the following business day.  For prescription refill requests, have your pharmacy contact our office and allow 72 hours.    Due to Covid, you will need to wear a mask upon entering the hospital. If you do not have a mask, a mask will be given to you at the Main Entrance upon arrival. For doctor visits, patients may have 1 support person with them. For treatment visits, patients can not have anyone with them due to social distancing guidelines and our immunocompromised population.     

## 2019-09-19 ENCOUNTER — Ambulatory Visit (HOSPITAL_COMMUNITY): Payer: PPO

## 2019-09-26 ENCOUNTER — Other Ambulatory Visit: Payer: Self-pay

## 2019-09-26 ENCOUNTER — Inpatient Hospital Stay (HOSPITAL_COMMUNITY): Payer: PPO | Attending: Hematology

## 2019-09-26 VITALS — BP 127/72 | HR 74 | Temp 96.9°F | Resp 18

## 2019-09-26 DIAGNOSIS — M810 Age-related osteoporosis without current pathological fracture: Secondary | ICD-10-CM

## 2019-09-26 MED ORDER — DENOSUMAB 60 MG/ML ~~LOC~~ SOSY
60.0000 mg | PREFILLED_SYRINGE | Freq: Once | SUBCUTANEOUS | Status: AC
  Administered 2019-09-26: 60 mg via SUBCUTANEOUS

## 2019-09-26 NOTE — Progress Notes (Signed)
Tamara [name] presents today for denosumab injection. Lab work reviewed prior to administration. VSS. Pt reports taking Ca and Vit D as instructed. Pt denies tooth/jaw pain and denies recent or future invasive dental work. Injection tolerated well, see MAR for details. Site clean and dry, band aid applied. Pt discharged in satisfactory condition with follow up instructions.

## 2019-09-26 NOTE — Patient Instructions (Signed)
[facility] at [facility]  Discharge Instructions:  Prolia injection received today. Follow up as previously scheduled. _______________________________________________________________  Thank you for choosing [facility] at [facility] to provide your oncology and hematology care.  To afford each patient quality time with our providers, please arrive at least 15 minutes before your scheduled appointment.  You need to re-schedule your appointment if you arrive 10 or more minutes late.  We strive to give you quality time with our providers, and arriving late affects you and other patients whose appointments are after yours.  Also, if you no show three or more times for appointments you may be dismissed from the clinic.  Again, thank you for choosing [facility] hope is that these requests will allow you access to exceptional care and in a timely manner. _______________________________________________________________  If you have questions after your visit, please contact our office at [phone] between the hours of 8:30 a.m. and 5:00 p.m. Voicemails left after 4:30 p.m. will not be returned until the following business day. _______________________________________________________________  For prescription refill requests, have your pharmacy contact our office. _______________________________________________________________  Recommendations made by the consultant and any test results will be sent to your referring physician. _______________________________________________________________

## 2019-10-13 ENCOUNTER — Ambulatory Visit (HOSPITAL_COMMUNITY): Payer: PPO

## 2019-10-16 DIAGNOSIS — I10 Essential (primary) hypertension: Secondary | ICD-10-CM | POA: Diagnosis not present

## 2019-10-16 DIAGNOSIS — G309 Alzheimer's disease, unspecified: Secondary | ICD-10-CM | POA: Diagnosis not present

## 2019-10-16 DIAGNOSIS — J8401 Alveolar proteinosis: Secondary | ICD-10-CM | POA: Diagnosis not present

## 2019-10-30 ENCOUNTER — Encounter (HOSPITAL_COMMUNITY): Payer: Self-pay

## 2019-10-30 ENCOUNTER — Other Ambulatory Visit: Payer: Self-pay

## 2019-10-30 ENCOUNTER — Inpatient Hospital Stay (HOSPITAL_COMMUNITY): Payer: PPO | Attending: Hematology

## 2019-10-30 VITALS — BP 131/72 | HR 82 | Temp 96.9°F | Resp 18

## 2019-10-30 DIAGNOSIS — E538 Deficiency of other specified B group vitamins: Secondary | ICD-10-CM | POA: Diagnosis not present

## 2019-10-30 MED ORDER — CYANOCOBALAMIN 1000 MCG/ML IJ SOLN
INTRAMUSCULAR | Status: AC
  Filled 2019-10-30: qty 1

## 2019-10-30 MED ORDER — CYANOCOBALAMIN 1000 MCG/ML IJ SOLN
1000.0000 ug | Freq: Once | INTRAMUSCULAR | Status: AC
  Administered 2019-10-30: 1000 ug via INTRAMUSCULAR

## 2019-10-30 NOTE — Progress Notes (Signed)
Patient tolerated B12 injection with no complaints voiced.  Site clean and dry with no bruising or swelling noted at site.  Band aid applied.  Family present for teaching.  All questions asked and answered.  Vss with discharge and left ambulatory with no s/s of distress noted.

## 2019-10-31 ENCOUNTER — Other Ambulatory Visit (HOSPITAL_COMMUNITY): Payer: Self-pay | Admitting: Nurse Practitioner

## 2019-10-31 DIAGNOSIS — E538 Deficiency of other specified B group vitamins: Secondary | ICD-10-CM

## 2019-10-31 MED ORDER — "BD DISP NEEDLES 27G X 1/2"" MISC": 1.0000 | 0 refills | Status: DC

## 2019-10-31 MED ORDER — CYANOCOBALAMIN 1000 MCG/ML IJ SOLN: 1000.0000 ug | INTRAMUSCULAR | 1 refills | Status: DC

## 2019-11-13 ENCOUNTER — Ambulatory Visit (HOSPITAL_COMMUNITY): Payer: PPO

## 2019-11-27 ENCOUNTER — Other Ambulatory Visit (HOSPITAL_COMMUNITY): Payer: Self-pay

## 2019-11-27 DIAGNOSIS — E538 Deficiency of other specified B group vitamins: Secondary | ICD-10-CM

## 2019-11-28 ENCOUNTER — Other Ambulatory Visit (HOSPITAL_COMMUNITY): Payer: Self-pay | Admitting: Nurse Practitioner

## 2019-11-28 DIAGNOSIS — E538 Deficiency of other specified B group vitamins: Secondary | ICD-10-CM

## 2019-11-28 MED ORDER — CYANOCOBALAMIN 1000 MCG/ML IJ SOLN: 1000.0000 ug | INTRAMUSCULAR | 1 refills | Status: DC

## 2019-11-28 MED ORDER — "BD DISP NEEDLES 27G X 1/2"" MISC": 1.0000 | 0 refills | Status: DC

## 2019-12-13 ENCOUNTER — Ambulatory Visit (HOSPITAL_COMMUNITY): Payer: PPO

## 2020-01-12 ENCOUNTER — Ambulatory Visit (HOSPITAL_COMMUNITY): Payer: PPO

## 2020-02-12 ENCOUNTER — Ambulatory Visit (HOSPITAL_COMMUNITY): Payer: PPO

## 2020-03-12 ENCOUNTER — Other Ambulatory Visit (HOSPITAL_COMMUNITY): Payer: PPO

## 2020-03-14 ENCOUNTER — Ambulatory Visit (HOSPITAL_COMMUNITY): Payer: PPO

## 2020-03-19 ENCOUNTER — Ambulatory Visit (HOSPITAL_COMMUNITY): Payer: PPO

## 2020-03-19 ENCOUNTER — Ambulatory Visit (HOSPITAL_COMMUNITY): Payer: PPO | Admitting: Nurse Practitioner

## 2020-04-10 ENCOUNTER — Other Ambulatory Visit (HOSPITAL_COMMUNITY): Payer: Self-pay

## 2020-04-10 DIAGNOSIS — Z170 Estrogen receptor positive status [ER+]: Secondary | ICD-10-CM

## 2020-04-10 DIAGNOSIS — E538 Deficiency of other specified B group vitamins: Secondary | ICD-10-CM

## 2020-04-10 DIAGNOSIS — M810 Age-related osteoporosis without current pathological fracture: Secondary | ICD-10-CM

## 2020-04-11 ENCOUNTER — Inpatient Hospital Stay (HOSPITAL_COMMUNITY): Payer: PPO | Attending: Hematology

## 2020-04-11 ENCOUNTER — Other Ambulatory Visit (HOSPITAL_COMMUNITY): Payer: Self-pay | Admitting: Nurse Practitioner

## 2020-04-11 ENCOUNTER — Inpatient Hospital Stay (HOSPITAL_COMMUNITY): Payer: PPO | Admitting: Nurse Practitioner

## 2020-04-11 ENCOUNTER — Inpatient Hospital Stay (HOSPITAL_COMMUNITY): Payer: PPO

## 2020-04-11 ENCOUNTER — Other Ambulatory Visit: Payer: Self-pay

## 2020-04-11 DIAGNOSIS — Z170 Estrogen receptor positive status [ER+]: Secondary | ICD-10-CM | POA: Diagnosis not present

## 2020-04-11 DIAGNOSIS — E538 Deficiency of other specified B group vitamins: Secondary | ICD-10-CM | POA: Diagnosis not present

## 2020-04-11 DIAGNOSIS — Z1231 Encounter for screening mammogram for malignant neoplasm of breast: Secondary | ICD-10-CM

## 2020-04-11 DIAGNOSIS — M810 Age-related osteoporosis without current pathological fracture: Secondary | ICD-10-CM | POA: Insufficient documentation

## 2020-04-11 DIAGNOSIS — C50411 Malignant neoplasm of upper-outer quadrant of right female breast: Secondary | ICD-10-CM | POA: Insufficient documentation

## 2020-04-11 LAB — CBC WITH DIFFERENTIAL/PLATELET
Abs Immature Granulocytes: 0.02 10*3/uL (ref 0.00–0.07)
Basophils Absolute: 0 10*3/uL (ref 0.0–0.1)
Basophils Relative: 1 %
Eosinophils Absolute: 0 10*3/uL (ref 0.0–0.5)
Eosinophils Relative: 1 %
HCT: 39.2 % (ref 36.0–46.0)
Hemoglobin: 12.4 g/dL (ref 12.0–15.0)
Immature Granulocytes: 0 %
Lymphocytes Relative: 18 %
Lymphs Abs: 1.1 10*3/uL (ref 0.7–4.0)
MCH: 30.7 pg (ref 26.0–34.0)
MCHC: 31.6 g/dL (ref 30.0–36.0)
MCV: 97 fL (ref 80.0–100.0)
Monocytes Absolute: 0.7 10*3/uL (ref 0.1–1.0)
Monocytes Relative: 11 %
Neutro Abs: 4.4 10*3/uL (ref 1.7–7.7)
Neutrophils Relative %: 69 %
Platelets: 206 10*3/uL (ref 150–400)
RBC: 4.04 MIL/uL (ref 3.87–5.11)
RDW: 13.4 % (ref 11.5–15.5)
WBC: 6.3 10*3/uL (ref 4.0–10.5)
nRBC: 0 % (ref 0.0–0.2)

## 2020-04-11 LAB — COMPREHENSIVE METABOLIC PANEL
ALT: 14 U/L (ref 0–44)
AST: 16 U/L (ref 15–41)
Albumin: 4.1 g/dL (ref 3.5–5.0)
Alkaline Phosphatase: 58 U/L (ref 38–126)
Anion gap: 7 (ref 5–15)
BUN: 16 mg/dL (ref 8–23)
CO2: 28 mmol/L (ref 22–32)
Calcium: 9 mg/dL (ref 8.9–10.3)
Chloride: 105 mmol/L (ref 98–111)
Creatinine, Ser: 0.88 mg/dL (ref 0.44–1.00)
GFR calc Af Amer: >60 mL/min (ref >60)
GFR calc non Af Amer: >60 mL/min (ref >60)
Glucose, Bld: 102 mg/dL — ABNORMAL HIGH (ref 70–99)
Potassium: 3.8 mmol/L (ref 3.5–5.1)
Sodium: 140 mmol/L (ref 135–145)
Total Bilirubin: 0.8 mg/dL (ref 0.3–1.2)
Total Protein: 7.3 g/dL (ref 6.5–8.1)

## 2020-04-11 LAB — IRON AND TIBC
Iron: 112 ug/dL (ref 28–170)
Saturation Ratios: 33 % — ABNORMAL HIGH (ref 10.4–31.8)
TIBC: 340 ug/dL (ref 250–450)
UIBC: 228 ug/dL

## 2020-04-11 LAB — LACTATE DEHYDROGENASE: LDH: 166 U/L (ref 98–192)

## 2020-04-11 LAB — FOLATE: Folate: 26.8 ng/mL (ref >5.9)

## 2020-04-11 LAB — VITAMIN D 25 HYDROXY (VIT D DEFICIENCY, FRACTURES): Vit D, 25-Hydroxy: 33.42 ng/mL (ref 30–100)

## 2020-04-11 LAB — VITAMIN B12: Vitamin B-12: 411 pg/mL (ref 180–914)

## 2020-04-11 LAB — FERRITIN: Ferritin: 154 ng/mL (ref 11–307)

## 2020-04-11 NOTE — Progress Notes (Unsigned)
[facility] [place], [place] [address]   CLINIC:  Medical Oncology/Hematology  PCP:  [name], MD [address] 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  Her2 negative, ER 100%, PR 85%, Ki-67 17%.   03/08/2013 Surgery   Right lumpectomy with sentinel node biopsy revealing adjacent DCIS (intermediate grade).  Lymph node is negative for disease.  T1b N0.   03/24/2013 Imaging   Bone density-  Osteoporosis   04/26/2013 Survivorship   Prolia every 6 months   04/28/2013 -  Chemotherapy   Aromasin daily.  She will take for 5 years.   04/08/2015 Imaging   Bone density- There has been a statistically significant IMPROVEMENT compared to patient's previous exam of 03/24/2013.   11/14/2015 Pathology Results   BCI- low risk of late recurrence (year 5-10) at 3.7%, low likelihood of benefit from extended endocrine therapy, low risk of overall recurrence (years 0-10) 6.6%.     CANCER STAGING: Cancer Staging Malignant neoplasm of upper-outer quadrant of RIGHT female breast ([place]) Staging form: Breast, AJCC 7th Edition - Clinical: Stage IA (T1b, N0, cM0) - Signed by Baird Cancer, PA-C on 04/25/2013    INTERVAL HISTORY:  Tamara [name] 86 y.o. female returns for routine follow-up for breast cancer. Denies any nausea, vomiting, or diarrhea. Denies any new pains. Had not noticed any recent bleeding such as epistaxis, hematuria or hematochezia. Denies recent chest pain on exertion, shortness of breath on minimal exertion, pre-syncopal episodes, or palpitations. Denies any numbness or tingling in hands or feet. Denies any recent fevers, infections, or recent  hospitalizations. Patient reports appetite at ***% and energy level at ***%. Patient is eating well and maintaining her weight at this time.      REVIEW OF SYSTEMS:  Review of Systems  All other systems reviewed and are negative.    PAST MEDICAL/SURGICAL HISTORY:  Past Medical History:  Diagnosis Date  . B12 deficiency 06/08/2016  . Breast cancer ([place])   . Breast cancer, right breast ([place]) 04/25/2013  . Cancer ([place])   . Claustrophobia   . Claustrophobia   . Dementia ([place])   . Hypertension   . Iron deficiency anemia 12/15/2016  . Malignant neoplasm of upper-outer quadrant of RIGHT female breast ([place]) 04/25/2013   Stage IA (T1b, N0, M0) invasive mucinous breast carcinoma, S/P right breast lumpectomy by Dr. Arnoldo Morale on 03/08/2013 with 1 negative sentinel node. Cancer was identified as ER+ 100%, PR+ 85%, Her2 negative, and Ki-67 marker at 17%. Oncotype Dx score of 20 with 13% 10 year risk of distant recurrence and absolute benefit of chemotherapy at 10 years in this risk category is negligible and less than 1%.  . Osteoporosis    Past Surgical History:  Procedure Laterality Date  . ABDOMINAL HYSTERECTOMY    . CATARACT EXTRACTION, BILATERAL    . PARTIAL MASTECTOMY WITH NEEDLE LOCALIZATION AND AXILLARY SENTINEL LYMPH NODE BX Right 03/08/2013   Procedure: PARTIAL MASTECTOMY WITH NEEDLE LOCALIZATION AND AXILLARY SENTINEL LYMPH NODE BX;  Surgeon: Jamesetta So, MD;  Location: [facility];  Service: General;  Laterality: Right;  Sentinel Node Bx @ 7:30am Needle Loc @ 8:00am     SOCIAL HISTORY:  Social History   Socioeconomic History  .  Marital status: Widowed    Spouse name: Not on file  . Number of children: Not on file  . Years of education: Not on file  . Highest education level: Not on file  Occupational History  . Not on file  Tobacco Use  . Smoking status: Former Smoker    Packs/day: 0.50    Years: 40.00    Pack years: 20.00    Types: Cigarettes    Quit date: 03/03/1967     Years since quitting: 53.1  . Smokeless tobacco: Never Used  Substance and Sexual Activity  . Alcohol use: No  . Drug use: No  . Sexual activity: Yes    Birth control/protection: Surgical  Other Topics Concern  . Not on file  Social History Narrative  . Not on file   Social Determinants of Health   Financial Resource Strain:   . Difficulty of Paying Living Expenses: Not on file  Food Insecurity:   . Worried About Charity fundraiser in the Last Year: Not on file  . Ran Out of Food in the Last Year: Not on file  Transportation Needs:   . Lack of Transportation (Medical): Not on file  . Lack of Transportation (Non-Medical): Not on file  Physical Activity:   . Days of Exercise per Week: Not on file  . Minutes of Exercise per Session: Not on file  Stress:   . Feeling of Stress : Not on file  Social Connections:   . Frequency of Communication with Friends and Family: Not on file  . Frequency of Social Gatherings with Friends and Family: Not on file  . Attends Religious Services: Not on file  . Active Member of Clubs or Organizations: Not on file  . Attends Archivist Meetings: Not on file  . Marital Status: Not on file  Intimate Partner Violence:   . Fear of Current or Ex-Partner: Not on file  . Emotionally Abused: Not on file  . Physically Abused: Not on file  . Sexually Abused: Not on file    FAMILY HISTORY:  Family History  Problem Relation Age of Onset  . Diabetes Mother   . Cancer Father   . Cancer Sister   . Cancer Brother     CURRENT MEDICATIONS:  Outpatient Encounter Medications as of 04/11/2020  Medication Sig  . amLODipine (NORVASC) 5 MG tablet Take 5 mg by mouth daily.  . calcium-vitamin D (OSCAL WITH D) 500-200 MG-UNIT per tablet Take 2 tablets by mouth daily with breakfast.  . cyanocobalamin (,VITAMIN B-12,) 1000 MCG/ML injection Inject 1 mL (1,000 mcg total) into the skin every 30 (thirty) days.  Marland Kitchen donepezil (ARICEPT) 10 MG tablet   .  exemestane (AROMASIN) 25 MG tablet TAKE 1 TABLET BY MOUTH ONCE DAILY AFTER BREAKFAST  . FLUAD QUADRIVALENT 0.5 ML injection   . HYDROcodone-acetaminophen (NORCO/VICODIN) 5-325 MG tablet Take 1 tablet by mouth every 4 (four) hours as needed.  . lidocaine (LIDODERM) 5 % Place 1 patch onto the skin daily. Remove & Discard patch within 12 hours or as directed by MD  . losartan (COZAAR) 25 MG tablet   . NEEDLE, DISP, 27 G (BD DISP NEEDLES) 27G X 1/2" MISC 1 Syringe by Does not apply route every 30 (thirty) days.  Marland Kitchen olmesartan (BENICAR) 20 MG tablet    No facility-administered encounter medications on file as of 04/11/2020.    ALLERGIES:  No Known Allergies   PHYSICAL EXAM:  ECOG Performance status: 1  There were no  vitals filed for this visit. There were no vitals filed for this visit.    Physical Exam Constitutional:      Appearance: Normal appearance. She is normal weight.  Cardiovascular:     Rate and Rhythm: Normal rate and regular rhythm.     Heart sounds: Normal heart sounds.  Pulmonary:     Effort: Pulmonary effort is normal.     Breath sounds: Normal breath sounds.  Abdominal:     General: Bowel sounds are normal.     Palpations: Abdomen is soft.  Musculoskeletal:        General: Normal range of motion.  Skin:    General: Skin is warm.  Neurological:     Mental Status: She is alert and oriented to person, place, and time. Mental status is at baseline.  Psychiatric:        Mood and Affect: Mood normal.        Behavior: Behavior normal.        Thought Content: Thought content normal.        Judgment: Judgment normal.      LABORATORY DATA:  I have reviewed the labs as listed.  CBC    Component Value Date/Time   WBC 5.6 02/24/2019 1000   RBC 3.97 02/24/2019 1000   HGB 12.3 02/24/2019 1000   HCT 38.3 02/24/2019 1000   PLT 192 02/24/2019 1000   MCV 96.5 02/24/2019 1000   MCH 31.0 02/24/2019 1000   MCHC 32.1 02/24/2019 1000   RDW 13.1 02/24/2019 1000    LYMPHSABS 1.0 02/24/2019 1000   MONOABS 0.8 02/24/2019 1000   EOSABS 0.1 02/24/2019 1000   BASOSABS 0.0 02/24/2019 1000   CMP Latest Ref Rng & Units 09/15/2019 02/24/2019 08/23/2018  Glucose 70 - 99 mg/dL 111(H) 92 109(H)  BUN 8 - 23 mg/dL 18 21 20   Creatinine 0.44 - 1.00 mg/dL 0.91 0.82 0.86  Sodium 135 - 145 mmol/L 140 140 139  Potassium 3.5 - 5.1 mmol/L 3.8 3.9 3.9  Chloride 98 - 111 mmol/L 102 106 105  CO2 22 - 32 mmol/L 28 27 26   Calcium 8.9 - 10.3 mg/dL 8.8(L) 8.7(L) 8.9  Total Protein 6.5 - 8.1 g/dL 7.1 7.2 7.2  Total Bilirubin 0.3 - 1.2 mg/dL 0.9 0.6 0.7  Alkaline Phos 38 - 126 U/L 67 67 73  AST 15 - 41 U/L 16 18 16   ALT 0 - 44 U/L 13 15 11     DIAGNOSTIC IMAGING:  I have independently reviewed the scans and discussed with the patient.  ASSESSMENT & PLAN:  Malignant neoplasm of upper-outer quadrant of RIGHT female breast ([place]) 1.  Stage Ia right breast IDC: -Status post lumpectomy and lymph node biopsy on 03/08/2013, which showed invasive mucinous carcinoma, 0.6 cm, grade 1, margins negative, ER/PR positive, HER-2 negative, Ki-67 17% -Oncotype DX shows recurrence score of 20, average rate of distant recurrence of 13% - Adjuvant chemotherapy was not offered.  Patient declined radiation therapy. -Aromasin was started in October 2014, continued till October 2019. -BCI showed low risk of recurrence and low benefit from extending adjuvant endocrine therapy. -Today's physical examination showed right upper outer quadrant lumpectomy scar well-healed.  No palpable masses in either breast and no axillary adenopathy. - Her last mammogram was on 02/15/2018 was B RADS category 2 benign.  Her next mammogram is scheduled tomorrow on 03/02/2019.  She reports she is going to cancel that appointment and reschedule it for next week. -We will see her back in 1 year  with repeat mammogram and repeat labs.  2.  Osteoporosis: -DEXA scan on 04/15/2017 showed T score of -2.5. -She is receiving Prolia  every 6 months, last injection on today 03/01/2019 -She is also taking calcium and vitamin D daily - We will repeat her DEXA scan with her next visit.  3.  Vitamin B12 deficiency: - She is receiving vitamin B12 injections monthly until May 2019, where she stopped coming to get the injections.. - Her last B12 level was 171 hence we recommended her to start back on her vitamin B12 injections monthly. - Labs on 02/24/2019 showed her B12 level has come up to 281. -We will continue monthly injections.     Orders placed this encounter:  No orders of the defined types were placed in this encounter.     Francene Finders, FNP-C [place] [phone]

## 2020-04-11 NOTE — Progress Notes (Signed)
Mm

## 2020-04-11 NOTE — Assessment & Plan Note (Signed)
1.  Stage Ia right breast IDC: -Status post lumpectomy and lymph node biopsy on 03/08/2013, which showed invasive mucinous carcinoma, 0.6 cm, grade 1, margins negative, ER/PR positive, HER-2 negative, Ki-67 17% -Oncotype DX shows recurrence score of 20, average rate of distant recurrence of 13% - Adjuvant chemotherapy was not offered.  Patient declined radiation therapy. -Aromasin was started in October 2014, continued till October 2019. -BCI showed low risk of recurrence and low benefit from extending adjuvant endocrine therapy. -Today's physical examination showed right upper outer quadrant lumpectomy scar well-healed.  No palpable masses in either breast and no axillary adenopathy. - Her last mammogram was on 02/15/2018 was B RADS category 2 benign.  Her next mammogram is scheduled tomorrow on 03/02/2019.  She reports she is going to cancel that appointment and reschedule it for next week. -We will see her back in 1 year with repeat mammogram and repeat labs.  2.  Osteoporosis: -DEXA scan on 04/15/2017 showed T score of -2.5. -She is receiving Prolia every 6 months, last injection on today 03/01/2019 -She is also taking calcium and vitamin D daily - We will repeat her DEXA scan with her next visit.  3.  Vitamin B12 deficiency: - She is receiving vitamin B12 injections monthly until May 2019, where she stopped coming to get the injections.. - Her last B12 level was 171 hence we recommended her to start back on her vitamin B12 injections monthly. - Labs on 02/24/2019 showed her B12 level has come up to 281. -We will continue monthly injections. 

## 2020-04-15 ENCOUNTER — Other Ambulatory Visit (HOSPITAL_COMMUNITY): Payer: Self-pay | Admitting: Nurse Practitioner

## 2020-04-15 DIAGNOSIS — Z1231 Encounter for screening mammogram for malignant neoplasm of breast: Secondary | ICD-10-CM

## 2020-04-17 ENCOUNTER — Inpatient Hospital Stay (HOSPITAL_COMMUNITY): Payer: PPO

## 2020-04-17 ENCOUNTER — Encounter (HOSPITAL_COMMUNITY): Payer: Self-pay

## 2020-04-17 ENCOUNTER — Other Ambulatory Visit: Payer: Self-pay

## 2020-04-17 ENCOUNTER — Ambulatory Visit (HOSPITAL_COMMUNITY): Payer: PPO

## 2020-04-17 ENCOUNTER — Ambulatory Visit (HOSPITAL_COMMUNITY): Payer: PPO | Admitting: Nurse Practitioner

## 2020-04-17 VITALS — BP 118/72 | HR 72 | Temp 98.6°F | Resp 18

## 2020-04-17 DIAGNOSIS — M810 Age-related osteoporosis without current pathological fracture: Secondary | ICD-10-CM

## 2020-04-17 DIAGNOSIS — C50411 Malignant neoplasm of upper-outer quadrant of right female breast: Secondary | ICD-10-CM | POA: Diagnosis not present

## 2020-04-17 MED ORDER — DENOSUMAB 60 MG/ML ~~LOC~~ SOSY
PREFILLED_SYRINGE | SUBCUTANEOUS | Status: AC
  Filled 2020-04-17: qty 1

## 2020-04-17 MED ORDER — DENOSUMAB 60 MG/ML ~~LOC~~ SOSY
60.0000 mg | PREFILLED_SYRINGE | Freq: Once | SUBCUTANEOUS | Status: AC
  Administered 2020-04-17: 60 mg via SUBCUTANEOUS

## 2020-04-17 NOTE — Patient Instructions (Signed)
[facility] at [facility] Discharge Instructions  Received Prolia injection today. Follow-up as scheduled   Thank you for choosing [facility] at [facility] to provide your oncology and hematology care.  To afford each patient quality time with our provider, please arrive at least 15 minutes before your scheduled appointment time.   If you have a lab appointment with the Cancer Center please come in thru the Main Entrance and check in at the main information desk.  You need to re-schedule your appointment should you arrive 10 or more minutes late.  We strive to give you quality time with our providers, and arriving late affects you and other patients whose appointments are after yours.  Also, if you no show three or more times for appointments you may be dismissed from the clinic at the providers discretion.     Again, thank you for choosing [facility].  Our hope is that these requests will decrease the amount of time that you wait before being seen by our physicians.       _____________________________________________________________  Should you have questions after your visit to  Cancer Center, please contact our office at [phone] and follow the prompts.  Our office hours are 8:00 a.m. and 4:30 p.m. Monday - Friday.  Please note that voicemails left after 4:00 p.m. may not be returned until the following business day.  We are closed weekends and major holidays.  You do have access to a nurse 24-7, just call the main number to the clinic [phone] and do not press any options, hold on the line and a nurse will answer the phone.    For prescription refill requests, have your pharmacy contact our office and allow 72 hours.    Due to Covid, you will need to wear a mask upon entering the hospital. If you do not have a mask, a mask will be given to you at the Main Entrance upon arrival. For doctor visits, patients may have  1 support person age 18 or older with them. For treatment visits, patients can not have anyone with them due to social distancing guidelines and our immunocompromised population.     

## 2020-04-17 NOTE — Progress Notes (Signed)
Tamara [name] tolerated Prolia injection well without complaints or incident. Calcium 9 today and pt denied any tooth or jaw pain and no recent or future dental visits prior to administering this medication. VSS Pt continues to take her Calcium PO as prescribed without issues. Pt discharged self ambulatory in satisfactory condition accompanied by family member

## 2020-04-24 ENCOUNTER — Other Ambulatory Visit: Payer: Self-pay

## 2020-04-24 ENCOUNTER — Ambulatory Visit (HOSPITAL_COMMUNITY)
Admission: RE | Admit: 2020-04-24 | Discharge: 2020-04-24 | Disposition: A | Discharge status: Home / Self Care | Payer: PPO | Source: Ambulatory Visit | Attending: Nurse Practitioner | Admitting: Nurse Practitioner

## 2020-04-24 DIAGNOSIS — Z1231 Encounter for screening mammogram for malignant neoplasm of breast: Secondary | ICD-10-CM | POA: Insufficient documentation

## 2020-04-30 ENCOUNTER — Other Ambulatory Visit (HOSPITAL_COMMUNITY): Payer: PPO

## 2020-04-30 ENCOUNTER — Encounter (HOSPITAL_COMMUNITY): Payer: PPO

## 2020-05-15 ENCOUNTER — Inpatient Hospital Stay (HOSPITAL_COMMUNITY): Payer: PPO | Attending: Hematology | Admitting: Hematology

## 2020-05-15 DIAGNOSIS — Z170 Estrogen receptor positive status [ER+]: Secondary | ICD-10-CM

## 2020-06-28 DIAGNOSIS — M1711 Unilateral primary osteoarthritis, right knee: Secondary | ICD-10-CM | POA: Diagnosis not present

## 2020-08-22 DIAGNOSIS — I10 Essential (primary) hypertension: Secondary | ICD-10-CM | POA: Diagnosis not present

## 2020-08-22 DIAGNOSIS — G309 Alzheimer's disease, unspecified: Secondary | ICD-10-CM | POA: Diagnosis not present

## 2020-08-22 DIAGNOSIS — Z79899 Other long term (current) drug therapy: Secondary | ICD-10-CM | POA: Diagnosis not present

## 2020-08-22 DIAGNOSIS — M810 Age-related osteoporosis without current pathological fracture: Secondary | ICD-10-CM | POA: Diagnosis not present

## 2020-08-22 DIAGNOSIS — I700 Atherosclerosis of aorta: Secondary | ICD-10-CM | POA: Diagnosis not present

## 2020-08-22 DIAGNOSIS — E1129 Type 2 diabetes mellitus with other diabetic kidney complication: Secondary | ICD-10-CM | POA: Diagnosis not present

## 2020-08-22 DIAGNOSIS — D519 Vitamin B12 deficiency anemia, unspecified: Secondary | ICD-10-CM | POA: Diagnosis not present

## 2020-08-30 DIAGNOSIS — I10 Essential (primary) hypertension: Secondary | ICD-10-CM | POA: Diagnosis not present

## 2020-08-30 DIAGNOSIS — G309 Alzheimer's disease, unspecified: Secondary | ICD-10-CM | POA: Diagnosis not present

## 2020-08-30 DIAGNOSIS — E1122 Type 2 diabetes mellitus with diabetic chronic kidney disease: Secondary | ICD-10-CM | POA: Diagnosis not present

## 2020-08-30 DIAGNOSIS — J8410 Pulmonary fibrosis, unspecified: Secondary | ICD-10-CM | POA: Diagnosis not present

## 2020-08-30 DIAGNOSIS — Z23 Encounter for immunization: Secondary | ICD-10-CM | POA: Diagnosis not present

## 2020-10-16 ENCOUNTER — Other Ambulatory Visit (HOSPITAL_COMMUNITY): Payer: Self-pay

## 2020-10-16 DIAGNOSIS — M810 Age-related osteoporosis without current pathological fracture: Secondary | ICD-10-CM

## 2020-10-16 DIAGNOSIS — C50411 Malignant neoplasm of upper-outer quadrant of right female breast: Secondary | ICD-10-CM

## 2020-10-16 DIAGNOSIS — E538 Deficiency of other specified B group vitamins: Secondary | ICD-10-CM

## 2020-10-16 DIAGNOSIS — Z170 Estrogen receptor positive status [ER+]: Secondary | ICD-10-CM

## 2020-10-16 DIAGNOSIS — Z1231 Encounter for screening mammogram for malignant neoplasm of breast: Secondary | ICD-10-CM

## 2020-10-17 ENCOUNTER — Inpatient Hospital Stay (HOSPITAL_COMMUNITY): Payer: PPO | Attending: Hematology

## 2020-10-17 ENCOUNTER — Other Ambulatory Visit: Payer: Self-pay

## 2020-10-17 DIAGNOSIS — E538 Deficiency of other specified B group vitamins: Secondary | ICD-10-CM | POA: Diagnosis not present

## 2020-10-17 DIAGNOSIS — M810 Age-related osteoporosis without current pathological fracture: Secondary | ICD-10-CM | POA: Diagnosis not present

## 2020-10-17 DIAGNOSIS — C50411 Malignant neoplasm of upper-outer quadrant of right female breast: Secondary | ICD-10-CM | POA: Diagnosis not present

## 2020-10-17 LAB — COMPREHENSIVE METABOLIC PANEL
ALT: 13 U/L (ref 0–44)
AST: 17 U/L (ref 15–41)
Albumin: 3.8 g/dL (ref 3.5–5.0)
Alkaline Phosphatase: 69 U/L (ref 38–126)
Anion gap: 7 (ref 5–15)
BUN: 15 mg/dL (ref 8–23)
CO2: 28 mmol/L (ref 22–32)
Calcium: 8.9 mg/dL (ref 8.9–10.3)
Chloride: 105 mmol/L (ref 98–111)
Creatinine, Ser: 0.82 mg/dL (ref 0.44–1.00)
GFR, Estimated: >60 mL/min (ref >60)
Glucose, Bld: 84 mg/dL (ref 70–99)
Potassium: 3.8 mmol/L (ref 3.5–5.1)
Sodium: 140 mmol/L (ref 135–145)
Total Bilirubin: 0.9 mg/dL (ref 0.3–1.2)
Total Protein: 7.3 g/dL (ref 6.5–8.1)

## 2020-10-17 LAB — CBC WITH DIFFERENTIAL/PLATELET
Abs Immature Granulocytes: 0.02 10*3/uL (ref 0.00–0.07)
Basophils Absolute: 0 10*3/uL (ref 0.0–0.1)
Basophils Relative: 0 %
Eosinophils Absolute: 0.1 10*3/uL (ref 0.0–0.5)
Eosinophils Relative: 1 %
HCT: 37.7 % (ref 36.0–46.0)
Hemoglobin: 12.3 g/dL (ref 12.0–15.0)
Immature Granulocytes: 0 %
Lymphocytes Relative: 17 %
Lymphs Abs: 1 10*3/uL (ref 0.7–4.0)
MCH: 31.9 pg (ref 26.0–34.0)
MCHC: 32.6 g/dL (ref 30.0–36.0)
MCV: 97.9 fL (ref 80.0–100.0)
Monocytes Absolute: 0.8 10*3/uL (ref 0.1–1.0)
Monocytes Relative: 13 %
Neutro Abs: 4.1 10*3/uL (ref 1.7–7.7)
Neutrophils Relative %: 69 %
Platelets: 168 10*3/uL (ref 150–400)
RBC: 3.85 MIL/uL — ABNORMAL LOW (ref 3.87–5.11)
RDW: 13.7 % (ref 11.5–15.5)
WBC: 6 10*3/uL (ref 4.0–10.5)
nRBC: 0 % (ref 0.0–0.2)

## 2020-10-17 LAB — IRON AND TIBC
Iron: 74 ug/dL (ref 28–170)
Saturation Ratios: 23 % (ref 10.4–31.8)
TIBC: 327 ug/dL (ref 250–450)
UIBC: 253 ug/dL

## 2020-10-17 LAB — VITAMIN D 25 HYDROXY (VIT D DEFICIENCY, FRACTURES): Vit D, 25-Hydroxy: 39.82 ng/mL (ref 30–100)

## 2020-10-17 LAB — FERRITIN: Ferritin: 142 ng/mL (ref 11–307)

## 2020-10-17 LAB — FOLATE: Folate: 41.2 ng/mL (ref >5.9)

## 2020-10-17 LAB — LACTATE DEHYDROGENASE: LDH: 173 U/L (ref 98–192)

## 2020-10-17 LAB — VITAMIN B12: Vitamin B-12: 142 pg/mL — ABNORMAL LOW (ref 180–914)

## 2020-10-21 ENCOUNTER — Inpatient Hospital Stay (HOSPITAL_COMMUNITY): Payer: PPO | Attending: Hematology | Admitting: Hematology

## 2020-10-21 ENCOUNTER — Other Ambulatory Visit: Payer: Self-pay

## 2020-10-21 ENCOUNTER — Inpatient Hospital Stay (HOSPITAL_COMMUNITY): Payer: PPO

## 2020-10-21 ENCOUNTER — Encounter (HOSPITAL_COMMUNITY): Payer: Self-pay | Admitting: Hematology

## 2020-10-21 VITALS — BP 120/66 | HR 69 | Temp 97.2°F | Resp 18 | Wt 145.2 lb

## 2020-10-21 DIAGNOSIS — M810 Age-related osteoporosis without current pathological fracture: Secondary | ICD-10-CM

## 2020-10-21 DIAGNOSIS — C50411 Malignant neoplasm of upper-outer quadrant of right female breast: Secondary | ICD-10-CM | POA: Diagnosis not present

## 2020-10-21 DIAGNOSIS — Z170 Estrogen receptor positive status [ER+]: Secondary | ICD-10-CM | POA: Diagnosis not present

## 2020-10-21 DIAGNOSIS — E538 Deficiency of other specified B group vitamins: Secondary | ICD-10-CM | POA: Diagnosis not present

## 2020-10-21 DIAGNOSIS — Z853 Personal history of malignant neoplasm of breast: Secondary | ICD-10-CM | POA: Insufficient documentation

## 2020-10-21 MED ORDER — CYANOCOBALAMIN 1000 MCG/ML IJ SOLN: 1000.0000 ug | Freq: Once | INTRAMUSCULAR | Status: DC

## 2020-10-21 MED ORDER — DENOSUMAB 60 MG/ML ~~LOC~~ SOSY
60.0000 mg | PREFILLED_SYRINGE | Freq: Once | SUBCUTANEOUS | Status: AC
  Administered 2020-10-21: 60 mg via SUBCUTANEOUS
  Filled 2020-10-21: qty 1

## 2020-10-21 MED ORDER — CYANOCOBALAMIN 1000 MCG/ML IJ SOLN
1000.0000 ug | Freq: Once | INTRAMUSCULAR | Status: AC
  Administered 2020-10-21: 1000 ug via INTRAMUSCULAR

## 2020-10-21 MED ORDER — CYANOCOBALAMIN 1000 MCG/ML IJ SOLN
INTRAMUSCULAR | Status: AC
  Filled 2020-10-21: qty 1

## 2020-10-21 NOTE — Patient Instructions (Signed)
[place] at [facility]  Discharge Instructions:  You received your B12 and Prolia injections. Return as scheduled. _______________________________________________________________  Thank you for choosing [place] at [facility] to provide your oncology and hematology care.  To afford each patient quality time with our providers, please arrive at least 15 minutes before your scheduled appointment.  You need to re-schedule your appointment if you arrive 10 or more minutes late.  We strive to give you quality time with our providers, and arriving late affects you and other patients whose appointments are after yours.  Also, if you no show three or more times for appointments you may be dismissed from the clinic.  Again, thank you for choosing [place] at [place] hope is that these requests will allow you access to exceptional care and in a timely manner. _______________________________________________________________  If you have questions after your visit, please contact our office at [phone] between the hours of 8:30 a.m. and 5:00 p.m. Voicemails left after 4:30 p.m. will not be returned until the following business day. _______________________________________________________________  For prescription refill requests, have your pharmacy contact our office. _______________________________________________________________  Recommendations made by the consultant and any test results will be sent to your referring physician. _______________________________________________________________

## 2020-10-21 NOTE — Patient Instructions (Signed)
[place] at [facility] Discharge Instructions  You were seen today by Dr. Delton Coombes. He went over your recent results. You received your Prolia and vitamin B12 injections today. Purchase vitamin B12 1,000 mcg over the counter and take 1 tablet daily. Dr. Delton Coombes will see you back in 6 months for labs and follow up.   Thank you for choosing [place] at [facility] to provide your oncology and hematology care.  To afford each patient quality time with our provider, please arrive at least 15 minutes before your scheduled appointment time.   If you have a lab appointment with the [place] please come in thru the Main Entrance and check in at the main information desk  You need to re-schedule your appointment should you arrive 10 or more minutes late.  We strive to give you quality time with our providers, and arriving late affects you and other patients whose appointments are after yours.  Also, if you no show three or more times for appointments you may be dismissed from the clinic at the providers discretion.     Again, thank you for choosing [facility].  Our hope is that these requests will decrease the amount of time that you wait before being seen by our physicians.       _____________________________________________________________  Should you have questions after your visit to [facility], please contact our office at [phone] between the hours of 8:00 a.m. and 4:30 p.m.  Voicemails left after 4:00 p.m. will not be returned until the following business day.  For prescription refill requests, have your pharmacy contact our office and allow 72 hours.    Cancer Center Support Programs:   > Cancer Support Group  2nd Tuesday of the month 1pm-2pm, Journey Room

## 2020-10-21 NOTE — Progress Notes (Signed)
Patient received B12 injection and Prolia. Patient taking calcium as directed. Denied tooth, jaw, and leg pain. No recent or upcoming dental visits. Labs reviewed. Patient tolerated injection with no complaints voiced. See MAR for details. Patient stable during and after injection. Site clean and dry with no bruising or swelling noted. Band aid applied. Vss with discharge and left in satisfactory condition with no s/s of distress.

## 2020-10-21 NOTE — Progress Notes (Signed)
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  Her2 negative, ER 100%, PR 85%, Ki-67 17%.   03/08/2013 Surgery   Right lumpectomy with sentinel node biopsy revealing adjacent DCIS (intermediate grade).  Lymph node is negative for disease.  T1b N0.   03/24/2013 Imaging   Bone density-  Osteoporosis   04/26/2013 Survivorship   Prolia every 6 months   04/28/2013 -  Chemotherapy   Aromasin daily.  She will take for 5 years.   04/08/2015 Imaging   Bone density- There has been a statistically significant IMPROVEMENT compared to patient's previous exam of 03/24/2013.   11/14/2015 Pathology Results   BCI- low risk of late recurrence (year 5-10) at 3.7%, low likelihood of benefit from extended endocrine therapy, low risk of overall recurrence (years 0-10) 6.6%.     CHIEF COMPLIANT: Follow-up for right breast cancer   INTERVAL HISTORY: Tamara [name] is a 87 y.o. female here today for follow up of her right breast cancer. Her last visit was with Francene Finders, NP, on 04/11/2020.   Today she reports feeling well. She stopped taking vitamin B12 injections in April 2021 and has not been taking vitamin B12.   REVIEW OF SYSTEMS:   Review of Systems  Constitutional: Positive for fatigue (75%). Negative for appetite change.  All other systems reviewed and are negative.   I have reviewed the past medical history, past surgical history, social history and family history with the patient and they are unchanged from previous note.   ALLERGIES:   has No Known Allergies.   MEDICATIONS:  Current Outpatient Medications  Medication Sig Dispense Refill  . amLODipine (NORVASC) 5 MG  tablet Take 5 mg by mouth daily.    . calcium-vitamin D (OSCAL WITH D) 500-200 MG-UNIT per tablet Take 2 tablets by mouth daily with breakfast. 60 tablet 3  . cyanocobalamin (,VITAMIN B-12,) 1000 MCG/ML injection Inject 1 mL (1,000 mcg total) into the skin every 30 (thirty) days. 10 mL 1  . donepezil (ARICEPT) 10 MG tablet     . exemestane (AROMASIN) 25 MG tablet TAKE 1 TABLET BY MOUTH ONCE DAILY AFTER BREAKFAST 90 tablet 0  . FLUAD QUADRIVALENT 0.5 ML injection     . HYDROcodone-acetaminophen (NORCO/VICODIN) 5-325 MG tablet Take 1 tablet by mouth every 4 (four) hours as needed. (Patient not taking: Reported on 04/11/2020) 10 tablet 0  . lidocaine (LIDODERM) 5 % Place 1 patch onto the skin daily. Remove & Discard patch within 12 hours or as directed by MD 5 patch 0  . losartan (COZAAR) 25 MG tablet     . NEEDLE, DISP, 27 G (BD DISP NEEDLES) 27G X 1/2" MISC 1 Syringe by Does not apply route every 30 (thirty) days. 50 each 0  . olmesartan (BENICAR) 20 MG tablet      No current facility-administered medications for this visit.   Facility-Administered Medications Ordered in Other Visits  Medication Dose Route Frequency Provider Last Rate Last Admin  . cyanocobalamin ((VITAMIN B-12)) injection 1,000 mcg  1,000 mcg Intramuscular Once Derek Jack, MD      . denosumab (PROLIA) injection 60 mg  60 mg Subcutaneous Once Derek Jack, MD  PHYSICAL EXAMINATION: Performance status (ECOG): 1 - Symptomatic but completely ambulatory  Vitals:   10/21/20 1459  BP: 120/66  Pulse: 69  Resp: 18  Temp: (!) 97.2 F (36.2 C)  SpO2: 100%   Wt Readings from Last 3 Encounters:  10/21/20 145 lb 3.2 oz (65.9 kg)  04/11/20 144 lb 9.6 oz (65.6 kg)  03/01/19 146 lb (66.2 kg)   Physical Exam Vitals reviewed.  Constitutional:      Appearance: Normal appearance.  Cardiovascular:     Rate and Rhythm: Normal rate and regular rhythm.     Pulses: Normal pulses.     Heart sounds: Normal  heart sounds.  Pulmonary:     Effort: Pulmonary effort is normal.     Breath sounds: Normal breath sounds.  Chest:  Breasts:     Right: Normal. No swelling, bleeding, inverted nipple, mass, nipple discharge, skin change (lumpectomy scar healed), tenderness, axillary adenopathy or supraclavicular adenopathy.     Left: Normal. No swelling, bleeding, inverted nipple, mass, nipple discharge, skin change, tenderness, axillary adenopathy or supraclavicular adenopathy.    Abdominal:     Palpations: Abdomen is soft. There is no hepatomegaly or mass.     Tenderness: There is no abdominal tenderness.     Hernia: No hernia is present.  Lymphadenopathy:     Cervical: No cervical adenopathy.     Upper Body:     Right upper body: No supraclavicular, axillary or pectoral adenopathy.     Left upper body: No supraclavicular, axillary or pectoral adenopathy.     Lower Body: No right inguinal adenopathy. No left inguinal adenopathy.  Neurological:     General: No focal deficit present.     Mental Status: She is alert and oriented to person, place, and time.  Psychiatric:        Mood and Affect: Mood normal.        Behavior: Behavior normal.     Breast Exam Chaperone: Milinda Antis, MD     LABORATORY DATA:  I have reviewed the data as listed CMP Latest Ref Rng & Units 10/17/2020 04/11/2020 09/15/2019  Glucose 70 - 99 mg/dL 84 102(H) 111(H)  BUN 8 - 23 mg/dL _0 Creatinine 0.44 - 1.00 mg/dL 0.82 0.88 0.91  Sodium 135 - 145 mmol/L 140 140 140  Potassium 3.5 - 5.1 mmol/L 3.8 3.8 3.8  Chloride 98 - 111 mmol/L 105 105 102  CO2 22 - 32 mmol/L _1 Calcium 8.9 - 10.3 mg/dL 8.9 9.0 8.8(L)  Total Protein 6.5 - 8.1 g/dL 7.3 7.3 7.1  Total Bilirubin 0.3 - 1.2 mg/dL 0.9 0.8 0.9  Alkaline Phos 38 - 126 U/L 69 58 67  AST 15 - 41 U/L _2 ALT 0 - 44 U/L _3 No results found for: [id number] Lab Results  Component Value Date   WBC 6.0 10/17/2020   HGB 12.3 10/17/2020   HCT 37.7  10/17/2020   MCV 97.9 10/17/2020   PLT 168 10/17/2020   NEUTROABS 4.1 10/17/2020   Lab Results  Component Value Date   LDH 173 10/17/2020   LDH 166 04/11/2020   LDH 174 08/23/2018   Lab Results  Component Value Date   VD25OH 39.82 10/17/2020   VD25OH 33.42 04/11/2020   VD25OH 44.7 02/24/2019   Lab Results  Component Value Date   TIBC 327 10/17/2020   TIBC 340 04/11/2020   TIBC 294 02/24/2019   FERRITIN 142 10/17/2020  FERRITIN 154 04/11/2020   FERRITIN 174 02/24/2019   IRONPCTSAT 23 10/17/2020   IRONPCTSAT 33 (H) 04/11/2020   IRONPCTSAT 20 02/24/2019    ASSESSMENT:  1.  Stage Ia right breast IDC: -Status post lumpectomy and lymph node biopsy on 03/08/2013, which showed invasive mucinous carcinoma, 0.6 cm, grade 1, margins negative, ER/PR positive, HER-2 negative, Ki-67 17% -Oncotype DX shows recurrence score of 20, average rate of distant recurrence of 13% - Adjuvant chemotherapy was not offered.  Patient declined radiation therapy. -Aromasin was started in October 2014, continued till October 2019. -BCI showed low risk of recurrence and low benefit from extending adjuvant endocrine therapy. -Mammogram on 04/24/2020, BI-RADS Category 1.  2.  Osteoporosis: -DEXA scan on 04/15/2017 showed T score of -2.5. -Prolia every 6 months started in 2014.   3.  Vitamin B12 deficiency: -She was receiving B12 injections monthly and stopped after April 2021.   PLAN:  1.  Stage Ia right breast IDC: -She does not report any new onset pains.  Physical examination today did not reveal any palpable masses in bilateral breasts. -Reviewed mammogram from 04/24/2020 BI-RADS Category 1. -Reviewed chemistries from 10/17/2020 and CBC which were normal. -We will do yearly visits.  Continue yearly mammograms.  2.  Osteoporosis: -Continue calcium and vitamin D.  Continue Prolia every 6 months.  3.  Vitamin B12 deficiency: -B12 level was low at 142.  She was told to start taking B12 1 mg  tablet daily. -She will receive B12 injection as a loading dose. -RTC 6 months with CBC, vitamin B12 level.   Breast Cancer therapy associated bone loss: I have recommended calcium, Vitamin D and weight bearing exercises.   Orders Placed This Encounter  Procedures  . CBC with Differential/Platelet    Standing Status:   Future    Standing Expiration Date:   10/21/2021    Order Specific Question:   Release to patient    Answer:   Immediate  . Vitamin B12    Standing Status:   Future    Standing Expiration Date:   10/21/2021    Order Specific Question:   Release to patient    Answer:   Immediate   The patient has a good understanding of the overall plan. she agrees with it. she will call with any problems that may develop before the next visit here.    Derek Jack, MD [place] [phone]   I, Milinda Antis, am acting as a scribe for Dr. Sanda Linger.  I, Derek Jack MD, have reviewed the above documentation for accuracy and completeness, and I agree with the above.

## 2021-04-09 DIAGNOSIS — E1129 Type 2 diabetes mellitus with other diabetic kidney complication: Secondary | ICD-10-CM | POA: Diagnosis not present

## 2021-04-14 DIAGNOSIS — J8401 Alveolar proteinosis: Secondary | ICD-10-CM | POA: Diagnosis not present

## 2021-04-14 DIAGNOSIS — I10 Essential (primary) hypertension: Secondary | ICD-10-CM | POA: Diagnosis not present

## 2021-04-14 DIAGNOSIS — Z23 Encounter for immunization: Secondary | ICD-10-CM | POA: Diagnosis not present

## 2021-04-14 DIAGNOSIS — R7309 Other abnormal glucose: Secondary | ICD-10-CM | POA: Diagnosis not present

## 2021-04-14 DIAGNOSIS — E1122 Type 2 diabetes mellitus with diabetic chronic kidney disease: Secondary | ICD-10-CM | POA: Diagnosis not present

## 2021-04-16 ENCOUNTER — Inpatient Hospital Stay (HOSPITAL_COMMUNITY): Payer: PPO | Attending: Hematology

## 2021-04-23 ENCOUNTER — Inpatient Hospital Stay (HOSPITAL_COMMUNITY): Payer: PPO | Attending: Hematology | Admitting: Hematology

## 2021-04-23 ENCOUNTER — Inpatient Hospital Stay (HOSPITAL_COMMUNITY): Payer: PPO

## 2021-04-23 ENCOUNTER — Other Ambulatory Visit: Payer: Self-pay

## 2021-04-23 DIAGNOSIS — C50411 Malignant neoplasm of upper-outer quadrant of right female breast: Secondary | ICD-10-CM

## 2021-04-23 DIAGNOSIS — M810 Age-related osteoporosis without current pathological fracture: Secondary | ICD-10-CM | POA: Diagnosis not present

## 2021-04-23 DIAGNOSIS — Z170 Estrogen receptor positive status [ER+]: Secondary | ICD-10-CM | POA: Insufficient documentation

## 2021-04-23 DIAGNOSIS — E538 Deficiency of other specified B group vitamins: Secondary | ICD-10-CM | POA: Insufficient documentation

## 2021-04-23 DIAGNOSIS — Z79811 Long term (current) use of aromatase inhibitors: Secondary | ICD-10-CM | POA: Diagnosis not present

## 2021-04-23 LAB — CBC WITH DIFFERENTIAL/PLATELET
Abs Immature Granulocytes: 0.02 10*3/uL (ref 0.00–0.07)
Basophils Absolute: 0 10*3/uL (ref 0.0–0.1)
Basophils Relative: 1 %
Eosinophils Absolute: 0 10*3/uL (ref 0.0–0.5)
Eosinophils Relative: 1 %
HCT: 38.3 % (ref 36.0–46.0)
Hemoglobin: 12.4 g/dL (ref 12.0–15.0)
Immature Granulocytes: 0 %
Lymphocytes Relative: 21 %
Lymphs Abs: 1.4 10*3/uL (ref 0.7–4.0)
MCH: 31.2 pg (ref 26.0–34.0)
MCHC: 32.4 g/dL (ref 30.0–36.0)
MCV: 96.5 fL (ref 80.0–100.0)
Monocytes Absolute: 0.9 10*3/uL (ref 0.1–1.0)
Monocytes Relative: 13 %
Neutro Abs: 4.3 10*3/uL (ref 1.7–7.7)
Neutrophils Relative %: 64 %
Platelets: 208 10*3/uL (ref 150–400)
RBC: 3.97 MIL/uL (ref 3.87–5.11)
RDW: 13.4 % (ref 11.5–15.5)
WBC: 6.6 10*3/uL (ref 4.0–10.5)
nRBC: 0 % (ref 0.0–0.2)

## 2021-04-23 LAB — COMPREHENSIVE METABOLIC PANEL
ALT: 14 U/L (ref 0–44)
AST: 19 U/L (ref 15–41)
Albumin: 4.1 g/dL (ref 3.5–5.0)
Alkaline Phosphatase: 78 U/L (ref 38–126)
Anion gap: 8 (ref 5–15)
BUN: 20 mg/dL (ref 8–23)
CO2: 28 mmol/L (ref 22–32)
Calcium: 8.9 mg/dL (ref 8.9–10.3)
Chloride: 104 mmol/L (ref 98–111)
Creatinine, Ser: 1.01 mg/dL — ABNORMAL HIGH (ref 0.44–1.00)
GFR, Estimated: 55 mL/min — ABNORMAL LOW (ref >60)
Glucose, Bld: 93 mg/dL (ref 70–99)
Potassium: 4 mmol/L (ref 3.5–5.1)
Sodium: 140 mmol/L (ref 135–145)
Total Bilirubin: 0.9 mg/dL (ref 0.3–1.2)
Total Protein: 7.6 g/dL (ref 6.5–8.1)

## 2021-04-23 LAB — VITAMIN B12: Vitamin B-12: 187 pg/mL (ref 180–914)

## 2021-05-05 NOTE — Progress Notes (Signed)
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  Her2 negative, ER 100%, PR 85%, Ki-67 17%.   03/08/2013 Surgery   Right lumpectomy with sentinel node biopsy revealing adjacent DCIS (intermediate grade).  Lymph node is negative for disease.  T1b N0.   03/24/2013 Imaging   Bone density-  Osteoporosis   04/26/2013 Survivorship   Prolia every 6 months   04/28/2013 -  Chemotherapy   Aromasin daily.  She will take for 5 years.   04/08/2015 Imaging   Bone density- There has been a statistically significant IMPROVEMENT compared to patient's previous exam of 03/24/2013.   11/14/2015 Pathology Results   BCI- low risk of late recurrence (year 5-10) at 3.7%, low likelihood of benefit from extended endocrine therapy, low risk of overall recurrence (years 0-10) 6.6%.     CHIEF COMPLIANT: Follow-up for right breast cancer   INTERVAL HISTORY: Tamara [name] is a 87 y.o. female here today for follow up of her right breast cancer. Her last visit was on 10/21/2020.   Today she reports feeling good, and she is accompanied by her granddaughter. She denies any current bleeding, black stools, and tingling/numbness. She receives monthly vitamin B-12 injections at home.   REVIEW OF SYSTEMS:   Review of Systems  Constitutional:  Negative for appetite change and fatigue (70%).  HENT:   Negative for nosebleeds.   Respiratory:  Negative for hemoptysis.   Gastrointestinal:  Negative for blood in stool.  Genitourinary:  Negative for hematuria and vaginal bleeding.   Musculoskeletal:  Positive for arthralgias (knee).  Neurological:  Negative for numbness.  Hematological:  Does not bruise/bleed  easily.  All other systems reviewed and are negative.  I have reviewed the past medical history, past surgical history, social history and family history with the patient and they are unchanged from previous note.   ALLERGIES:   has No Known Allergies.   MEDICATIONS:  Current Outpatient Medications  Medication Sig Dispense Refill   amLODipine (NORVASC) 5 MG tablet Take 5 mg by mouth daily.     calcium-vitamin D (OSCAL WITH D) 500-200 MG-UNIT per tablet Take 2 tablets by mouth daily with breakfast. 60 tablet 3   cyanocobalamin (,VITAMIN B-12,) 1000 MCG/ML injection Inject 1 mL (1,000 mcg total) into the skin every 30 (thirty) days. 10 mL 1   donepezil (ARICEPT) 10 MG tablet      HYDROcodone-acetaminophen (NORCO/VICODIN) 5-325 MG tablet Take 1 tablet by mouth every 4 (four) hours as needed. 10 tablet 0   lidocaine (LIDODERM) 5 % Place 1 patch onto the skin daily. Remove & Discard patch within 12 hours or as directed by MD 5 patch 0   losartan (COZAAR) 25 MG tablet      NEEDLE, DISP, 27 G (BD DISP NEEDLES) 27G X 1/2" MISC 1 Syringe by Does not apply route every 30 (thirty) days. 50 each 0   olmesartan (BENICAR) 20 MG tablet      No current facility-administered medications for this visit.     PHYSICAL EXAMINATION: Performance status (ECOG): 1 - Symptomatic but completely ambulatory  Vitals:   05/06/21 1025  BP: 131/72  Pulse: 71  Resp: 18  Temp: (!) 97 F (36.1 C)  SpO2: 97%   Wt Readings from Last 3 Encounters:  05/06/21 142 lb 13.7 oz (64.8 kg)  10/21/20 145 lb 3.2 oz (65.9 kg)  04/11/20 144 lb 9.6 oz (65.6 kg)   Physical Exam Vitals reviewed.  Constitutional:      Appearance: Normal appearance.  Cardiovascular:     Rate and Rhythm: Normal rate and regular rhythm.     Pulses: Normal pulses.     Heart sounds: Normal heart sounds.  Pulmonary:     Effort: Pulmonary effort is normal.     Breath sounds: Normal breath sounds.  Chest:  Breasts:    Right: Normal.      Left: Normal.  Abdominal:     Palpations: Abdomen is soft. There is no hepatomegaly, splenomegaly or mass.     Tenderness: There is no abdominal tenderness.  Lymphadenopathy:     Cervical: No cervical adenopathy.     Right cervical: No superficial, deep or posterior cervical adenopathy.    Left cervical: No superficial, deep or posterior cervical adenopathy.     Upper Body:     Right upper body: No supraclavicular, axillary or pectoral adenopathy.     Left upper body: Axillary adenopathy present. No supraclavicular or pectoral adenopathy.  Neurological:     General: No focal deficit present.     Mental Status: She is alert and oriented to person, place, and time.  Psychiatric:        Mood and Affect: Mood normal.        Behavior: Behavior normal.    Breast Exam Chaperone: Thana Ates     LABORATORY DATA:  I have reviewed the data as listed CMP Latest Ref Rng & Units 04/23/2021 10/17/2020 04/11/2020  Glucose 70 - 99 mg/dL 93 84 102(H)  BUN 8 - 23 mg/dL _0 Creatinine 0.44 - 1.00 mg/dL 1.01(H) 0.82 0.88  Sodium 135 - 145 mmol/L 140 140 140  Potassium 3.5 - 5.1 mmol/L 4.0 3.8 3.8  Chloride 98 - 111 mmol/L 104 105 105  CO2 22 - 32 mmol/L _1 Calcium 8.9 - 10.3 mg/dL 8.9 8.9 9.0  Total Protein 6.5 - 8.1 g/dL 7.6 7.3 7.3  Total Bilirubin 0.3 - 1.2 mg/dL 0.9 0.9 0.8  Alkaline Phos 38 - 126 U/L 78 69 58  AST 15 - 41 U/L _2 ALT 0 - 44 U/L _3 No results found for: [id number] Lab Results  Component Value Date   WBC 6.6 04/23/2021   HGB 12.4 04/23/2021   HCT 38.3 04/23/2021   MCV 96.5 04/23/2021   PLT 208 04/23/2021   NEUTROABS 4.3 04/23/2021    ASSESSMENT:  1.  Stage Ia right breast IDC: -Status post lumpectomy and lymph node biopsy on 03/08/2013, which showed invasive mucinous carcinoma, 0.6 cm, grade 1, margins negative, ER/PR positive, HER-2 negative, Ki-67 17% -Oncotype DX shows recurrence score of 20, average rate of distant recurrence of 13% -  Adjuvant chemotherapy was not offered.  Patient declined radiation therapy. -Aromasin was started in October 2014, continued till October 2019. -BCI showed low risk of recurrence and low benefit from extending adjuvant endocrine therapy. -Mammogram on 04/24/2020, BI-RADS Category 1.   2.  Osteoporosis: -DEXA scan on 04/15/2017 showed T score of -2.5. -Prolia every 6 months started in 2014.     3.  Vitamin B12 deficiency: -She was receiving B12 injections monthly and stopped after April 2021.   PLAN:  1.  Stage Ia  right breast IDC: - Mammogram on 04/24/2020 was BI-RADS Category 1. - Physical examination today revealed palpable lymph node in the left axillary region.  No palpable masses in bilateral breast. - Labs from 04/23/2021 showed normal LFTs and CBC. - Recommend bilateral mammogram and left axillary ultrasound. - Will consider biopsy if the lymph node is confirmed. - RTC after mammogram and ultrasound.   2.  Osteoporosis: - Continue calcium and vitamin D.  Continue Prolia every 6 months.   3.  Vitamin B12 deficiency: - B12 level improved to 187 from 142 previously. - Continue B12 monthly injections.  Breast Cancer therapy associated bone loss: I have recommended calcium, Vitamin D and weight bearing exercises.  Orders placed this encounter:  No orders of the defined types were placed in this encounter.   The patient has a good understanding of the overall plan. She agrees with it. She will call with any problems that may develop before the next visit here.  Derek Jack, MD [place] [phone]   I, Thana Ates, am acting as a scribe for Dr. Derek Jack.  I, Derek Jack MD, have reviewed the above documentation for accuracy and completeness, and I agree with the above.

## 2021-05-06 ENCOUNTER — Inpatient Hospital Stay (HOSPITAL_BASED_OUTPATIENT_CLINIC_OR_DEPARTMENT_OTHER): Payer: PPO | Admitting: Hematology

## 2021-05-06 ENCOUNTER — Other Ambulatory Visit: Payer: Self-pay

## 2021-05-06 ENCOUNTER — Inpatient Hospital Stay (HOSPITAL_COMMUNITY): Payer: PPO

## 2021-05-06 ENCOUNTER — Other Ambulatory Visit (HOSPITAL_COMMUNITY): Payer: Self-pay | Admitting: Hematology

## 2021-05-06 VITALS — BP 131/72 | HR 71 | Temp 97.0°F | Resp 18 | Wt 142.9 lb

## 2021-05-06 DIAGNOSIS — Z9889 Other specified postprocedural states: Secondary | ICD-10-CM

## 2021-05-06 DIAGNOSIS — M810 Age-related osteoporosis without current pathological fracture: Secondary | ICD-10-CM

## 2021-05-06 DIAGNOSIS — C50411 Malignant neoplasm of upper-outer quadrant of right female breast: Secondary | ICD-10-CM | POA: Diagnosis not present

## 2021-05-06 DIAGNOSIS — Z170 Estrogen receptor positive status [ER+]: Secondary | ICD-10-CM

## 2021-05-06 MED ORDER — DENOSUMAB 60 MG/ML ~~LOC~~ SOSY
60.0000 mg | PREFILLED_SYRINGE | Freq: Once | SUBCUTANEOUS | Status: AC
  Administered 2021-05-06: 60 mg via SUBCUTANEOUS
  Filled 2021-05-06: qty 1

## 2021-05-06 NOTE — Patient Instructions (Signed)
[facility]  Discharge Instructions: ?Thank you for choosing [facility] to provide your oncology and hematology care.  ?If you have a lab appointment with the Cancer Center, please come in thru the Main Entrance and check in at the main information desk. ? ?Wear comfortable clothing and clothing appropriate for easy access to any Portacath or PICC line.  ? ?We strive to give you quality time with your provider. You may need to reschedule your appointment if you arrive late (15 or more minutes).  Arriving late affects you and other patients whose appointments are after yours.  Also, if you miss three or more appointments without notifying the office, you may be dismissed from the clinic at the provider?s discretion.    ?  ?For prescription refill requests, have your pharmacy contact our office and allow 72 hours for refills to be completed.   ? ?Today you received the following chemotherapy and/or immunotherapy agents Prolia    ?  ?To help prevent nausea and vomiting after your treatment, we encourage you to take your nausea medication as directed. ? ?BELOW ARE SYMPTOMS THAT SHOULD BE REPORTED IMMEDIATELY: ?*FEVER GREATER THAN 100.4 F (38 ?C) OR HIGHER ?*CHILLS OR SWEATING ?*NAUSEA AND VOMITING THAT IS NOT CONTROLLED WITH YOUR NAUSEA MEDICATION ?*UNUSUAL SHORTNESS OF BREATH ?*UNUSUAL BRUISING OR BLEEDING ?*URINARY PROBLEMS (pain or burning when urinating, or frequent urination) ?*BOWEL PROBLEMS (unusual diarrhea, constipation, pain near the anus) ?TENDERNESS IN MOUTH AND THROAT WITH OR WITHOUT PRESENCE OF ULCERS (sore throat, sores in mouth, or a toothache) ?UNUSUAL RASH, SWELLING OR PAIN  ?UNUSUAL VAGINAL DISCHARGE OR ITCHING  ? ?Items with * indicate a potential emergency and should be followed up as soon as possible or go to the Emergency Department if any problems should occur. ? ?Please show the CHEMOTHERAPY ALERT CARD or IMMUNOTHERAPY ALERT CARD at check-in to the Emergency  Department and triage nurse. ? ?Should you have questions after your visit or need to cancel or reschedule your appointment, please contact [facility] [phone]  and follow the prompts.  Office hours are 8:00 a.m. to 4:30 p.m. Monday - Friday. Please note that voicemails left after 4:00 p.m. may not be returned until the following business day.  We are closed weekends and major holidays. You have access to a nurse at all times for urgent questions. Please call the main number to the clinic [phone] and follow the prompts. ? ?For any non-urgent questions, you may also contact your provider using MyChart. We now offer e-Visits for anyone 18 and older to request care online for non-urgent symptoms. For details visit [email or web address]. ?  ?Also download the MyChart app! Go to the app store, search "MyChart", open the app, select [facility], and log in with your MyChart username and password. ? ?Due to Covid, a mask is required upon entering the hospital/clinic. If you do not have a mask, one will be given to you upon arrival. For doctor visits, patients may have 1 support person aged 18 or older with them. For treatment visits, patients cannot have anyone with them due to current Covid guidelines and our immunocompromised population.  ?

## 2021-05-06 NOTE — Patient Instructions (Addendum)
[place] at [facility] Discharge Instructions  You were seen and examined today by Dr. Delton Coombes. Mammogram and ultrasound of breast as scheduled. Prolia injection today. Return as scheduled after mammogram.    Thank you for choosing [place] at [facility] to provide your oncology and hematology care.  To afford each patient quality time with our provider, please arrive at least 15 minutes before your scheduled appointment time.   If you have a lab appointment with the [facility] please come in thru the Main Entrance and check in at the main information desk.  You need to re-schedule your appointment should you arrive 10 or more minutes late.  We strive to give you quality time with our providers, and arriving late affects you and other patients whose appointments are after yours.  Also, if you no show three or more times for appointments you may be dismissed from the clinic at the providers discretion.     Again, thank you for choosing [facility].  Our hope is that these requests will decrease the amount of time that you wait before being seen by our physicians.       _____________________________________________________________  Should you have questions after your visit to [facility], please contact our office at [phone] and follow the prompts.  Our office hours are 8:00 a.m. and 4:30 p.m. Monday - Friday.  Please note that voicemails left after 4:00 p.m. may not be returned until the following business day.  We are closed weekends and major holidays.  You do have access to a nurse 24-7, just call the main number to the clinic [phone] and do not press any options, hold on the line and a nurse will answer the phone.    For prescription refill requests, have your pharmacy contact our office and allow 72 hours.    Due to Covid, you will need to wear a mask upon entering the hospital. If you do not  have a mask, a mask will be given to you at the Main Entrance upon arrival. For doctor visits, patients may have 1 support person age 66 or older with them. For treatment visits, patients can not have anyone with them due to social distancing guidelines and our immunocompromised population.

## 2021-05-06 NOTE — Progress Notes (Signed)
Tamara [name] presents today for Prolia injection per the provider's orders.  Stable during administration without incident; injection site WNL; see MAR for injection details.  Patient tolerated procedure well and without incident.  No questions or complaints noted at this time.  Discharge from clinic ambulatory in stable condition.  Alert and oriented X 3.  Follow up with [facility] as scheduled.

## 2021-05-07 ENCOUNTER — Encounter (HOSPITAL_COMMUNITY): Payer: Self-pay | Admitting: Hematology

## 2021-05-22 ENCOUNTER — Ambulatory Visit (HOSPITAL_COMMUNITY): Payer: PPO

## 2021-05-22 ENCOUNTER — Encounter (HOSPITAL_COMMUNITY): Payer: PPO

## 2021-06-03 ENCOUNTER — Other Ambulatory Visit: Payer: Self-pay

## 2021-06-03 ENCOUNTER — Encounter (HOSPITAL_COMMUNITY): Payer: PPO

## 2021-06-03 ENCOUNTER — Other Ambulatory Visit (HOSPITAL_COMMUNITY): Payer: PPO

## 2021-06-03 ENCOUNTER — Ambulatory Visit (HOSPITAL_COMMUNITY)
Admission: RE | Admit: 2021-06-03 | Discharge: 2021-06-03 | Disposition: A | Discharge status: Home / Self Care | Payer: PPO | Source: Ambulatory Visit | Attending: Hematology | Admitting: Hematology

## 2021-06-03 DIAGNOSIS — Z170 Estrogen receptor positive status [ER+]: Secondary | ICD-10-CM

## 2021-06-03 DIAGNOSIS — Z9889 Other specified postprocedural states: Secondary | ICD-10-CM | POA: Diagnosis not present

## 2021-06-03 DIAGNOSIS — C50411 Malignant neoplasm of upper-outer quadrant of right female breast: Secondary | ICD-10-CM | POA: Diagnosis not present

## 2021-06-03 DIAGNOSIS — R922 Inconclusive mammogram: Secondary | ICD-10-CM | POA: Diagnosis not present

## 2021-06-03 NOTE — Progress Notes (Signed)
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  Her2 negative, ER 100%, PR 85%, Ki-67 17%.   03/08/2013 Surgery   Right lumpectomy with sentinel node biopsy revealing adjacent DCIS (intermediate grade).  Lymph node is negative for disease.  T1b N0.   03/24/2013 Imaging   Bone density-  Osteoporosis   04/26/2013 Survivorship   Prolia every 6 months   04/28/2013 -  Chemotherapy   Aromasin daily.  She will take for 5 years.   04/08/2015 Imaging   Bone density- There has been a statistically significant IMPROVEMENT compared to patient's previous exam of 03/24/2013.   11/14/2015 Pathology Results   BCI- low risk of late recurrence (year 5-10) at 3.7%, low likelihood of benefit from extended endocrine therapy, low risk of overall recurrence (years 0-10) 6.6%.     CHIEF COMPLIANT: Follow-up for right breast cancer   INTERVAL HISTORY: [name] is a 87 y.o. female here today for follow up of her right breast cancer. Her last visit was on 05/06/2021.   Today she reports feeling good. She denies any pain in her left axilla. She is taking calcium + vitamin D BID.  REVIEW OF SYSTEMS:   Review of Systems  Constitutional:  Negative for appetite change and fatigue.  Respiratory:  Positive for shortness of breath.   Neurological:  Positive for dizziness and headaches.  All other systems reviewed and are negative.  I have reviewed the past medical history, past surgical history, social history and family history with the patient and they are unchanged from previous note.   ALLERGIES:   has No Known Allergies.   MEDICATIONS:  Current Outpatient Medications  Medication  Sig Dispense Refill   amLODipine (NORVASC) 5 MG tablet Take 5 mg by mouth daily.     calcium-vitamin D (OSCAL WITH D) 500-200 MG-UNIT per tablet Take 2 tablets by mouth daily with breakfast. 60 tablet 3   cyanocobalamin (,VITAMIN B-12,) 1000 MCG/ML injection Inject 1 mL (1,000 mcg total) into the skin every 30 (thirty) days. 10 mL 1   donepezil (ARICEPT) 10 MG tablet      HYDROcodone-acetaminophen (NORCO/VICODIN) 5-325 MG tablet Take 1 tablet by mouth every 4 (four) hours as needed. 10 tablet 0   lidocaine (LIDODERM) 5 % Place 1 patch onto the skin daily. Remove & Discard patch within 12 hours or as directed by MD 5 patch 0   losartan (COZAAR) 25 MG tablet      NEEDLE, DISP, 27 G (BD DISP NEEDLES) 27G X 1/2" MISC 1 Syringe by Does not apply route every 30 (thirty) days. 50 each 0   olmesartan (BENICAR) 20 MG tablet      No current facility-administered medications for this visit.     PHYSICAL EXAMINATION: Performance status (ECOG): 1 - Symptomatic but completely ambulatory  Vitals:   06/04/21 1105  BP: 119/66  Pulse: 68  Resp: 16  Temp: 98.4 F (36.9 C)  SpO2: 100%   Wt Readings from Last 3 Encounters:  06/04/21 142 lb 3.2 oz (64.5 kg)  05/06/21 142 lb 13.7 oz (64.8 kg)  10/21/20 145 lb 3.2 oz (65.9 kg)   Physical Exam Vitals reviewed.  Constitutional:  Appearance: Normal appearance.  Cardiovascular:     Rate and Rhythm: Normal rate and regular rhythm.     Pulses: Normal pulses.     Heart sounds: Normal heart sounds.  Pulmonary:     Effort: Pulmonary effort is normal.     Breath sounds: Normal breath sounds.  Lymphadenopathy:     Upper Body:     Left upper body: Axillary adenopathy present.  Neurological:     General: No focal deficit present.     Mental Status: She is alert and oriented to person, place, and time.  Psychiatric:        Mood and Affect: Mood normal.        Behavior: Behavior normal.    Breast Exam Chaperone: Tamara [name]     LABORATORY  DATA:  I have reviewed the data as listed CMP Latest Ref Rng & Units 04/23/2021 10/17/2020 04/11/2020  Glucose 70 - 99 mg/dL 93 84 102(H)  BUN 8 - 23 mg/dL 20 15 16   Creatinine 0.44 - 1.00 mg/dL 1.01(H) 0.82 0.88  Sodium 135 - 145 mmol/L 140 140 140  Potassium 3.5 - 5.1 mmol/L 4.0 3.8 3.8  Chloride 98 - 111 mmol/L 104 105 105  CO2 22 - 32 mmol/L 28 28 28   Calcium 8.9 - 10.3 mg/dL 8.9 8.9 9.0  Total Protein 6.5 - 8.1 g/dL 7.6 7.3 7.3  Total Bilirubin 0.3 - 1.2 mg/dL 0.9 0.9 0.8  Alkaline Phos 38 - 126 U/L 78 69 58  AST 15 - 41 U/L 19 17 16   ALT 0 - 44 U/L 14 13 14    No results found for: EBR830 Lab Results  Component Value Date   WBC 6.6 04/23/2021   HGB 12.4 04/23/2021   HCT 38.3 04/23/2021   MCV 96.5 04/23/2021   PLT 208 04/23/2021   NEUTROABS 4.3 04/23/2021    ASSESSMENT:  1.  Stage Ia right breast IDC: -Status post lumpectomy and lymph node biopsy on 03/08/2013, which showed invasive mucinous carcinoma, 0.6 cm, grade 1, margins negative, ER/PR positive, HER-2 negative, Ki-67 17% -Oncotype DX shows recurrence score of 20, average rate of distant recurrence of 13% - Adjuvant chemotherapy was not offered.  Patient declined radiation therapy. -Aromasin was started in October 2014, continued till October 2019. -BCI showed low risk of recurrence and low benefit from extending adjuvant endocrine therapy. -Mammogram on 04/24/2020, BI-RADS Category 1.   2.  Osteoporosis: -DEXA scan on 04/15/2017 showed T score of -2.5. -Prolia every 6 months started in 2014.     3.  Vitamin B12 deficiency: -She was receiving B12 injections monthly and stopped after April 2021.   PLAN:  1.  Stage Ia right breast IDC: - Last physical exam showed prominence in the left axillary region. - We have done mammogram and ultrasound.  I have reviewed from 06/03/2021 which was BI-RADS Category 2. - Recommend follow-up in 1 year with repeat mammogram and labs.   2.  Osteoporosis: - Continue calcium and  vitamin D.  Continue Prolia every 6 months.   3.  Vitamin B12 deficiency: - Continue B12 injections monthly.  B12 level is 187.  Breast Cancer therapy associated bone loss: I have recommended calcium, Vitamin D and weight bearing exercises.  Orders placed this encounter:  No orders of the defined types were placed in this encounter.   The patient has a good understanding of the overall plan. She agrees with it. She will call with any problems that may develop before the next visit  here.  Derek Jack, MD [place] [phone]   I, Tamara [name], am acting as a scribe for Dr. Derek Jack.  I, Derek Jack MD, have reviewed the above documentation for accuracy and completeness, and I agree with the above.

## 2021-06-04 ENCOUNTER — Inpatient Hospital Stay (HOSPITAL_COMMUNITY): Payer: PPO | Attending: Hematology | Admitting: Hematology

## 2021-06-04 VITALS — BP 119/66 | HR 68 | Temp 98.4°F | Resp 16 | Wt 142.2 lb

## 2021-06-04 DIAGNOSIS — Z853 Personal history of malignant neoplasm of breast: Secondary | ICD-10-CM | POA: Insufficient documentation

## 2021-06-04 DIAGNOSIS — Z170 Estrogen receptor positive status [ER+]: Secondary | ICD-10-CM

## 2021-06-04 DIAGNOSIS — E538 Deficiency of other specified B group vitamins: Secondary | ICD-10-CM

## 2021-06-04 DIAGNOSIS — C50411 Malignant neoplasm of upper-outer quadrant of right female breast: Secondary | ICD-10-CM | POA: Diagnosis not present

## 2021-06-04 DIAGNOSIS — M810 Age-related osteoporosis without current pathological fracture: Secondary | ICD-10-CM | POA: Insufficient documentation

## 2021-06-05 MED ORDER — CYANOCOBALAMIN 1000 MCG/ML IJ SOLN: 1000.0000 ug | INTRAMUSCULAR | 1 refills | Status: DC

## 2021-06-05 NOTE — Addendum Note (Signed)
Addended by: Joie Bimler on: 06/05/2021 10:42 AM   Modules accepted: Orders

## 2021-11-04 ENCOUNTER — Ambulatory Visit (HOSPITAL_COMMUNITY): Payer: PPO

## 2021-11-04 ENCOUNTER — Inpatient Hospital Stay (HOSPITAL_COMMUNITY): Payer: PPO

## 2021-11-04 ENCOUNTER — Other Ambulatory Visit (HOSPITAL_COMMUNITY): Payer: PPO

## 2021-11-04 ENCOUNTER — Inpatient Hospital Stay (HOSPITAL_COMMUNITY): Payer: PPO | Attending: Hematology

## 2022-03-05 ENCOUNTER — Other Ambulatory Visit: Payer: Self-pay

## 2022-03-05 ENCOUNTER — Emergency Department (HOSPITAL_COMMUNITY): Payer: PPO

## 2022-03-05 ENCOUNTER — Encounter (HOSPITAL_COMMUNITY): Payer: Self-pay | Admitting: Hematology

## 2022-03-05 ENCOUNTER — Encounter (HOSPITAL_COMMUNITY): Payer: Self-pay

## 2022-03-05 ENCOUNTER — Emergency Department (HOSPITAL_COMMUNITY)
Admission: EM | Admit: 2022-03-05 | Discharge: 2022-03-06 | Disposition: A | Discharge status: Home / Self Care | Payer: PPO | Attending: Student | Admitting: Student

## 2022-03-05 DIAGNOSIS — Y92009 Unspecified place in unspecified non-institutional (private) residence as the place of occurrence of the external cause: Secondary | ICD-10-CM | POA: Diagnosis not present

## 2022-03-05 DIAGNOSIS — S52602A Unspecified fracture of lower end of left ulna, initial encounter for closed fracture: Secondary | ICD-10-CM

## 2022-03-05 DIAGNOSIS — W010XXA Fall on same level from slipping, tripping and stumbling without subsequent striking against object, initial encounter: Secondary | ICD-10-CM | POA: Insufficient documentation

## 2022-03-05 DIAGNOSIS — M7989 Other specified soft tissue disorders: Secondary | ICD-10-CM | POA: Diagnosis not present

## 2022-03-05 DIAGNOSIS — Y9301 Activity, walking, marching and hiking: Secondary | ICD-10-CM | POA: Diagnosis not present

## 2022-03-05 DIAGNOSIS — S52235A Nondisplaced oblique fracture of shaft of left ulna, initial encounter for closed fracture: Secondary | ICD-10-CM | POA: Insufficient documentation

## 2022-03-05 DIAGNOSIS — M79602 Pain in left arm: Secondary | ICD-10-CM | POA: Insufficient documentation

## 2022-03-05 DIAGNOSIS — F0390 Unspecified dementia without behavioral disturbance: Secondary | ICD-10-CM | POA: Diagnosis not present

## 2022-03-05 DIAGNOSIS — S59912A Unspecified injury of left forearm, initial encounter: Secondary | ICD-10-CM | POA: Diagnosis present

## 2022-03-05 DIAGNOSIS — S52502A Unspecified fracture of the lower end of left radius, initial encounter for closed fracture: Secondary | ICD-10-CM | POA: Diagnosis not present

## 2022-03-05 NOTE — ED Notes (Signed)
Tamara [name], [place] at bedside

## 2022-03-05 NOTE — ED Triage Notes (Signed)
Pt presents to ED from home with daughter, whom pt lives with, daughter says pt had walked to her neighbors house while she was at work and fell, unsure mechanism of fall as it was not witnessed and pt has dementia, unable to reports events of fall. Pt C/o left arm pain, abrasion noted to left forearm from fall. Pt ambulatory with full range of motion, cognitively pt at baseline per daughter.

## 2022-03-05 NOTE — ED Provider Notes (Signed)
[facility] EMERGENCY DEPARTMENT Provider Note   CSN: [id number] Arrival date & time: 03/05/22  2054     History Chief Complaint  Patient presents with   Fall    C/o left arm pain    PATRISHA HAUSMANN is a 88 y.o. female patient who presents to the emergency department after mechanical trip and fall that occurred prior to arrival.  Patient does have a history of dementia.  Daughter at bedside provides most of the history.  Patient was over at the neighbors house and upon walking home she fell.  She did not hit her head or lose consciousness.  She fell onto her left arm.  She has been complaining of pain since then.  Denies any other injury.   Fall       Home Medications Prior to Admission medications   Medication Sig Start Date End Date Taking? Authorizing Provider  amLODipine (NORVASC) 5 MG tablet Take 5 mg by mouth daily.    [provider]  calcium-vitamin D (OSCAL WITH D) 500-200 MG-UNIT per tablet Take 2 tablets by mouth daily with breakfast. 04/25/13   Baird Cancer, PA-C  cyanocobalamin (,VITAMIN B-12,) 1000 MCG/ML injection Inject 1 mL (1,000 mcg total) into the skin every 30 (thirty) days. 06/05/21   Derek Jack, MD  donepezil (ARICEPT) 10 MG tablet  06/09/17   [provider]  HYDROcodone-acetaminophen (NORCO/VICODIN) 5-325 MG tablet Take 1 tablet by mouth every 4 (four) hours as needed. 07/21/18   Maudie Flakes, MD  lidocaine (LIDODERM) 5 % Place 1 patch onto the skin daily. Remove & Discard patch within 12 hours or as directed by MD 07/21/18   Maudie Flakes, MD  losartan (COZAAR) 25 MG tablet  12/09/16   [provider]  NEEDLE, DISP, 27 G (BD DISP NEEDLES) 27G X 1/2" MISC 1 Syringe by Does not apply route every 30 (thirty) days. 11/28/19   Glennie Isle, NP-C  olmesartan (BENICAR) 20 MG tablet  09/28/17   [provider]      Allergies    Patient has no known allergies.    Review of Systems   Review of Systems  All  other systems reviewed and are negative.   Physical Exam Updated Vital Signs BP (!) 159/83   Pulse 93   Temp 98.1 F (36.7 C)   Resp 16   Ht '5\' 2"'$  (1.575 m)   Wt 64.5 kg   SpO2 97%   BMI 26.01 kg/m  Physical Exam Vitals and nursing note reviewed.  Constitutional:      Appearance: Normal appearance.  HENT:     Head: Normocephalic and atraumatic.  Eyes:     General:        Right eye: No discharge.        Left eye: No discharge.     Conjunctiva/sclera: Conjunctivae normal.  Pulmonary:     Effort: Pulmonary effort is normal.  Musculoskeletal:     Comments: Tenderness over the left forearm.  She has a strong 2+ radial pulse felt on the left.  Neurovascular intact distally.  Good cap refill in the fingers on the left hand.  Skin:    General: Skin is warm and dry.     Findings: No rash.  Neurological:     General: No focal deficit present.     Mental Status: She is alert.  Psychiatric:        Mood and Affect: Mood normal.  Behavior: Behavior normal.     ED Results / Procedures / Treatments   Labs (all labs ordered are listed, but only abnormal results are displayed) Labs Reviewed - No data to display  EKG None  Radiology DG Forearm Left  Result Date: 03/05/2022 CLINICAL DATA:  Fall. EXAM: LEFT FOREARM - 2 VIEW COMPARISON:  None Available. FINDINGS: There is an acute oblique nondisplaced fracture through the distal ulnar diaphysis. This is nonangulated. There is surrounding soft tissue swelling. No dislocation. IMPRESSION: Acute nondisplaced fracture of the distal ulnar diaphysis. Electronically Signed   By: Ronney Asters M.D.   On: 03/05/2022 21:30    Procedures Procedures   Medications Ordered in ED Medications - No data to display  ED Course/ Medical Decision Making/ A&P                           Medical Decision Making KINLEY DOZIER is a 88 y.o. female patient who presents to the emergency department for further evaluation of left arm pain.  X-ray was  ordered in triage and I personally interpreted this image and there is evidence of a minimally displaced left ulnar fracture.  This is likely the source of her pain.  Do not feel that she needs manipulation.  Bones are in good alignment.  We will splint with sugar-tong and plan to have her follow-up with Ortho.  I will have her take 600 mg of ibuprofen every 6 hours as needed for pain.  She was encouraged to return to the emergency room for any worsening symptoms.  She is safe for discharge.  Amount and/or Complexity of Data Reviewed Radiology: ordered.   Final Clinical Impression(s) / ED Diagnoses Final diagnoses:  Closed fracture of distal end of left ulna, unspecified fracture morphology, initial encounter    Rx / DC Orders ED Discharge Orders     None         Cherrie Gauze 03/05/22 2304    Teressa Lower, MD 03/06/22 1946

## 2022-03-05 NOTE — Discharge Instructions (Signed)
Please take 600 mg ibuprofen every 6 hours as needed for pain.  You can also take Tylenol if you are unable to take ibuprofen.  Please follow-up with orthopedics in 1 week for further evaluation.  Please return to the emergency department for worsening symptoms.

## 2022-03-10 ENCOUNTER — Ambulatory Visit: Payer: PPO | Admitting: Orthopedic Surgery

## 2022-03-10 ENCOUNTER — Encounter: Payer: Self-pay | Admitting: Orthopedic Surgery

## 2022-03-10 VITALS — Ht 62.0 in | Wt 142.0 lb

## 2022-03-10 DIAGNOSIS — S52225A Nondisplaced transverse fracture of shaft of left ulna, initial encounter for closed fracture: Secondary | ICD-10-CM

## 2022-03-10 NOTE — Patient Instructions (Signed)

## 2022-03-10 NOTE — Progress Notes (Incomplete)
ast

## 2022-03-24 ENCOUNTER — Ambulatory Visit (INDEPENDENT_AMBULATORY_CARE_PROVIDER_SITE_OTHER): Payer: PPO | Admitting: Orthopedic Surgery

## 2022-03-24 ENCOUNTER — Ambulatory Visit (INDEPENDENT_AMBULATORY_CARE_PROVIDER_SITE_OTHER): Payer: PPO

## 2022-03-24 ENCOUNTER — Encounter: Payer: Self-pay | Admitting: Orthopedic Surgery

## 2022-03-24 DIAGNOSIS — S52225A Nondisplaced transverse fracture of shaft of left ulna, initial encounter for closed fracture: Secondary | ICD-10-CM

## 2022-03-24 DIAGNOSIS — J8410 Pulmonary fibrosis, unspecified: Secondary | ICD-10-CM | POA: Insufficient documentation

## 2022-03-24 DIAGNOSIS — E119 Type 2 diabetes mellitus without complications: Secondary | ICD-10-CM | POA: Insufficient documentation

## 2022-03-24 DIAGNOSIS — R7303 Prediabetes: Secondary | ICD-10-CM | POA: Insufficient documentation

## 2022-03-24 DIAGNOSIS — I059 Rheumatic mitral valve disease, unspecified: Secondary | ICD-10-CM | POA: Insufficient documentation

## 2022-03-24 DIAGNOSIS — I350 Nonrheumatic aortic (valve) stenosis: Secondary | ICD-10-CM | POA: Insufficient documentation

## 2022-03-24 DIAGNOSIS — I10 Essential (primary) hypertension: Secondary | ICD-10-CM | POA: Insufficient documentation

## 2022-03-24 DIAGNOSIS — E118 Type 2 diabetes mellitus with unspecified complications: Secondary | ICD-10-CM | POA: Insufficient documentation

## 2022-03-24 NOTE — Patient Instructions (Signed)

## 2022-03-24 NOTE — Progress Notes (Signed)
Return patient Visit  Assessment: Tamara [name] is a 88 y.o. female with the following: 1. Closed nondisplaced transverse fracture of shaft of left ulna, subsequent encounter  Plan: Tamara [name] sustained a nondisplaced fracture of the left ulna.  Radiographs remained stable.  There is some evidence of healing.  Her pain is controlled.  Nothing concerning on physical exam.  Transition to 1 more cast.  At the next visit, we will assess, and consider another cast versus bracing.  Cast application - left short arm cast   Verbal consent was obtained and the correct extremity was identified. A well padded, appropriately molded short arm cast was applied to the left arm Fingers remained warm and well perfused.   There were no sharp edges Patient tolerated the procedure well Cast care instructions were provided  We provided her with a sling for comfort.    Follow-up: Return in about 2 weeks (around 04/07/2022).  Subjective:  Chief Complaint  Patient presents with   Follow-up    L ulna shaft fracture, minimal pain    History of Present Illness: Tamara [name] is a 88 y.o. female who returns for evaluation of left forearm pain.  She sustained a left ulna shaft fracture 2+ weeks ago.  She has been in a cast.  She has tolerated this well.  Occasional over-the-counter pain medication.  No numbness or tingling.  Review of Systems: No fevers or chills No numbness or tingling No chest pain No shortness of breath No bowel or bladder dysfunction No GI distress No headaches  Objective: There were no vitals taken for this visit.  Physical Exam:  General: Elderly female. and No acute distress. Gait: Slow, steady gait.  Left elbow swelling.  No bruising.  Some superficial abrasions.  These are healthy appearing.  Tenderness palpation over the mid forearm area.  Fingers warm and well-perfused.  Sensation is intact throughout the left hand.  IMAGING: I personally reviewed images previously  obtained from the ED  X-rays of the left forearm are obtained in clinic today.  There is a midshaft ulna fracture.  This is nondisplaced.  No angulation.  There is some evidence of callus formation.  No additional injuries are noted.  No dislocations.  Impression: Healing left ulnar shaft fracture  New Medications:  No orders of the defined types were placed in this encounter.     Mordecai Rasmussen, MD  03/24/2022 10:14 AM

## 2022-04-07 ENCOUNTER — Ambulatory Visit (INDEPENDENT_AMBULATORY_CARE_PROVIDER_SITE_OTHER): Payer: PPO | Admitting: Orthopedic Surgery

## 2022-04-07 ENCOUNTER — Ambulatory Visit (INDEPENDENT_AMBULATORY_CARE_PROVIDER_SITE_OTHER): Payer: PPO

## 2022-04-07 ENCOUNTER — Encounter: Payer: Self-pay | Admitting: Orthopedic Surgery

## 2022-04-07 VITALS — Ht 62.0 in | Wt 142.0 lb

## 2022-04-07 DIAGNOSIS — S52225A Nondisplaced transverse fracture of shaft of left ulna, initial encounter for closed fracture: Secondary | ICD-10-CM

## 2022-04-07 DIAGNOSIS — S52225S Nondisplaced transverse fracture of shaft of left ulna, sequela: Secondary | ICD-10-CM | POA: Diagnosis not present

## 2022-04-07 NOTE — Progress Notes (Signed)
Return patient Visit  Assessment: Tamara [name] is a 88 y.o. female with the following: 1. Closed nondisplaced transverse fracture of shaft of left ulna, subsequent encounter  Plan: Versie Starks sustained a nondisplaced fracture of the left ulna.  There is callus formation on x-rays.  This is palpable on physical exam.  She has minimal pain.  I am pleased with her progress today.  I think the bone is healed sufficiently enough to allow her to continue with a removable wrist brace.  She should use the brace for the next 2 weeks.  After that, she can increase her level of activity.  I will see her back in approximately 1 month.   Follow-up: Return in about 4 weeks (around 05/05/2022).  Subjective:  Chief Complaint  Patient presents with   Fracture    Lt forearm  DOS 03/05/22    History of Present Illness: Tamara [name] is a 88 y.o. female who returns for evaluation of left forearm pain.  She sustained a left ulna shaft fracture 4-5 weeks ago.  She has tolerated immobilization.  She is not complaining of pain.  She does have some dementia, but is not having any issues.  She is at home throughout the day by herself.  No issues with the cast.   Review of Systems: No fevers or chills No numbness or tingling No chest pain No shortness of breath No bowel or bladder dysfunction No GI distress No headaches  Objective: Ht '5\' 2"'$  (1.575 m)   Wt 142 lb (64.4 kg)   BMI 25.97 kg/m   Physical Exam:  General: Elderly female. and No acute distress. Gait: Slow, steady gait.  Evaluation of the left forearm demonstrates no bruising.  No swelling is appreciated.  Superficial lacerations are healing well.  Fracture site and callus formation is palpable.  No tenderness to palpation at the fracture site.  Fingers are warm and well-perfused.  2+ radial pulse.  IMAGING: I personally reviewed images previously obtained from the ED  X-rays of the left forearm were obtained in clinic today.  These  are compared to prior x-rays.  No acute injuries are noted.  There is callus formation at the fracture site.  No interval displacement.  Overall alignment remains unchanged.  Impression: Healing left ulnar shaft fracture   New Medications:  No orders of the defined types were placed in this encounter.     Mordecai Rasmussen, MD  04/07/2022 1:43 PM

## 2022-04-07 NOTE — Patient Instructions (Signed)
Refill times for the next 2 weeks.  Lifting greater than a coffee cup.  Okay to remove for hygiene.  In 2 weeks, can remove the brace, gradually increase level of activity.  Follow-up in 4 weeks.  Call the clinic if you have issues before then.

## 2022-05-05 ENCOUNTER — Ambulatory Visit (INDEPENDENT_AMBULATORY_CARE_PROVIDER_SITE_OTHER): Payer: PPO | Admitting: Orthopedic Surgery

## 2022-05-05 ENCOUNTER — Encounter: Payer: Self-pay | Admitting: Orthopedic Surgery

## 2022-05-05 DIAGNOSIS — S52225D Nondisplaced transverse fracture of shaft of left ulna, subsequent encounter for closed fracture with routine healing: Secondary | ICD-10-CM

## 2022-05-05 NOTE — Patient Instructions (Signed)
Stop using the brace on the left breast  Gradual return to previous level of activities  Be cautious as the left arm has probably lost some strength.  Follow-up as needed

## 2022-05-05 NOTE — Progress Notes (Signed)
Return patient Visit  Assessment: Tamara [name] is a 88 y.o. female with the following: 1. Closed nondisplaced transverse fracture of shaft of left ulna, subsequent encounter  Plan: Versie Starks has healed and left ulnar shaft fracture.  She has no pain at the fracture site.  She has near full range of motion.  Stressed the importance of transitioning out of the wrist brace.  She states understanding.  Gradual return to previous level of activity.  Follow-up as needed.   Follow-up: Return if symptoms worsen or fail to improve.  Subjective:  Chief Complaint  Patient presents with   Arm Injury    Left forearm fracture 03/05/22    History of Present Illness: Tamara [name] is a 88 y.o. female who returns for evaluation of left forearm pain.  She sustained a left ulna shaft fracture 2 months ago.  She has been using a removable wrist brace since I last saw her in clinic.  She is not complaining of pain.  She is gradually using the left arm on a more frequent basis.  She does continue to use the brace.  According to her daughter, she will have occasional sharp pains, but these are not consistent.   Review of Systems: No fevers or chills No numbness or tingling No chest pain No shortness of breath No bowel or bladder dysfunction No GI distress No headaches  Objective: There were no vitals taken for this visit.  Physical Exam:  General: Elderly female. and No acute distress. Gait: Slow, steady gait.  Left forearm without bruising.  No tenderness to palpation.  Full range of motion of the left elbow, as well as the wrist.  She has near full supination.  Fingers are warm and well-perfused.  Sensation is intact throughout the left hand.  IMAGING: I personally reviewed images previously obtained from the ED  No new imaging obtained today.  New Medications:  No orders of the defined types were placed in this encounter.     Mordecai Rasmussen, MD  05/05/2022 1:22 PM

## 2022-05-06 ENCOUNTER — Inpatient Hospital Stay: Payer: PPO

## 2022-05-08 ENCOUNTER — Inpatient Hospital Stay: Payer: PPO

## 2022-05-08 ENCOUNTER — Inpatient Hospital Stay: Payer: PPO | Attending: Hematology

## 2022-05-08 DIAGNOSIS — M8580 Other specified disorders of bone density and structure, unspecified site: Secondary | ICD-10-CM | POA: Insufficient documentation

## 2022-05-08 DIAGNOSIS — E538 Deficiency of other specified B group vitamins: Secondary | ICD-10-CM | POA: Insufficient documentation

## 2022-05-08 DIAGNOSIS — Z170 Estrogen receptor positive status [ER+]: Secondary | ICD-10-CM

## 2022-05-08 DIAGNOSIS — Z853 Personal history of malignant neoplasm of breast: Secondary | ICD-10-CM | POA: Diagnosis not present

## 2022-05-08 LAB — BASIC METABOLIC PANEL
Anion gap: 7 (ref 5–15)
BUN: 24 mg/dL — ABNORMAL HIGH (ref 8–23)
CO2: 27 mmol/L (ref 22–32)
Calcium: 8.7 mg/dL — ABNORMAL LOW (ref 8.9–10.3)
Chloride: 108 mmol/L (ref 98–111)
Creatinine, Ser: 0.96 mg/dL (ref 0.44–1.00)
GFR, Estimated: 58 mL/min — ABNORMAL LOW (ref >60)
Glucose, Bld: 113 mg/dL — ABNORMAL HIGH (ref 70–99)
Potassium: 4.2 mmol/L (ref 3.5–5.1)
Sodium: 142 mmol/L (ref 135–145)

## 2022-05-08 NOTE — Progress Notes (Signed)
No injection today. Patient has not seen provider in a year. Patient will follow up as scheduled.

## 2022-05-16 ENCOUNTER — Encounter (HOSPITAL_COMMUNITY): Payer: Self-pay | Admitting: Emergency Medicine

## 2022-05-16 ENCOUNTER — Emergency Department (HOSPITAL_COMMUNITY): Payer: PPO

## 2022-05-16 ENCOUNTER — Other Ambulatory Visit: Payer: Self-pay

## 2022-05-16 ENCOUNTER — Emergency Department (HOSPITAL_COMMUNITY)
Admission: EM | Admit: 2022-05-16 | Discharge: 2022-05-16 | Disposition: A | Discharge status: Home / Self Care | Payer: PPO | Attending: Emergency Medicine | Admitting: Emergency Medicine

## 2022-05-16 DIAGNOSIS — F0390 Unspecified dementia without behavioral disturbance: Secondary | ICD-10-CM | POA: Insufficient documentation

## 2022-05-16 DIAGNOSIS — R42 Dizziness and giddiness: Secondary | ICD-10-CM | POA: Diagnosis not present

## 2022-05-16 DIAGNOSIS — R519 Headache, unspecified: Secondary | ICD-10-CM | POA: Diagnosis not present

## 2022-05-16 LAB — CBC WITH DIFFERENTIAL/PLATELET
Abs Immature Granulocytes: 0.01 10*3/uL (ref 0.00–0.07)
Basophils Absolute: 0 10*3/uL (ref 0.0–0.1)
Basophils Relative: 0 %
Eosinophils Absolute: 0 10*3/uL (ref 0.0–0.5)
Eosinophils Relative: 1 %
HCT: 35.2 % — ABNORMAL LOW (ref 36.0–46.0)
Hemoglobin: 11.6 g/dL — ABNORMAL LOW (ref 12.0–15.0)
Immature Granulocytes: 0 %
Lymphocytes Relative: 14 %
Lymphs Abs: 0.8 10*3/uL (ref 0.7–4.0)
MCH: 31.1 pg (ref 26.0–34.0)
MCHC: 33 g/dL (ref 30.0–36.0)
MCV: 94.4 fL (ref 80.0–100.0)
Monocytes Absolute: 0.6 10*3/uL (ref 0.1–1.0)
Monocytes Relative: 11 %
Neutro Abs: 4.1 10*3/uL (ref 1.7–7.7)
Neutrophils Relative %: 74 %
Platelets: 187 10*3/uL (ref 150–400)
RBC: 3.73 MIL/uL — ABNORMAL LOW (ref 3.87–5.11)
RDW: 13.2 % (ref 11.5–15.5)
WBC: 5.5 10*3/uL (ref 4.0–10.5)
nRBC: 0 % (ref 0.0–0.2)

## 2022-05-16 LAB — BASIC METABOLIC PANEL
Anion gap: 3 — ABNORMAL LOW (ref 5–15)
BUN: 25 mg/dL — ABNORMAL HIGH (ref 8–23)
CO2: 27 mmol/L (ref 22–32)
Calcium: 8.6 mg/dL — ABNORMAL LOW (ref 8.9–10.3)
Chloride: 108 mmol/L (ref 98–111)
Creatinine, Ser: 0.93 mg/dL (ref 0.44–1.00)
GFR, Estimated: >60 mL/min (ref >60)
Glucose, Bld: 175 mg/dL — ABNORMAL HIGH (ref 70–99)
Potassium: 3.8 mmol/L (ref 3.5–5.1)
Sodium: 138 mmol/L (ref 135–145)

## 2022-05-16 MED ORDER — LACTATED RINGERS IV BOLUS
1000.0000 mL | Freq: Once | INTRAVENOUS | Status: AC
Start: 2022-05-16 — End: 2022-05-16
  Administered 2022-05-16: 1000 mL via INTRAVENOUS

## 2022-05-16 MED ORDER — MECLIZINE HCL 50 MG PO TABS: 50.0000 mg | ORAL_TABLET | Freq: Three times a day (TID) | ORAL | 0 refills | Status: DC | PRN

## 2022-05-16 MED ORDER — MECLIZINE HCL 12.5 MG PO TABS
25.0000 mg | ORAL_TABLET | Freq: Once | ORAL | Status: AC
Start: 2022-05-16 — End: 2022-05-16
  Administered 2022-05-16: 25 mg via ORAL
  Filled 2022-05-16: qty 2

## 2022-05-16 NOTE — ED Triage Notes (Signed)
Pt c/o dizziness that started today and a headache

## 2022-05-16 NOTE — ED Provider Notes (Signed)
[name] Provider Note   CSN: [id number] Arrival date & time: 05/16/22  1107     History Chief Complaint  Patient presents with   Dizziness    HPI Tamara [name] is a 88 y.o. female presenting for episode of dizziness.  Patient is a 88 year old female with a history of dementia.  She woke up this morning with dizziness per her son and daughter.  She has no acute concerns at this time and does not remember being dizzy.  Family endorses that when they tried to get her out of bed she endorsed spinning sensation.  They state when they tried to get her out of the truck she had a similar episode.  No history of similar in the past.  No history of strokes.  Patient is otherwise ambulatory tolerating p.o. intake.  Currently asymptomatic resting in bed.   Patient's recorded medical, surgical, social, medication list and allergies were reviewed in the Snapshot window as part of the initial history.   Review of Systems   Review of Systems  Constitutional:  Negative for chills and fever.  HENT:  Negative for ear pain and sore throat.   Eyes:  Negative for pain and visual disturbance.  Respiratory:  Negative for cough and shortness of breath.   Cardiovascular:  Negative for chest pain and palpitations.  Gastrointestinal:  Negative for abdominal pain and vomiting.  Genitourinary:  Negative for dysuria and hematuria.  Musculoskeletal:  Negative for arthralgias and back pain.  Skin:  Negative for color change and rash.  Neurological:  Positive for dizziness. Negative for seizures, syncope and facial asymmetry.  All other systems reviewed and are negative.   Physical Exam Updated Vital Signs BP 122/69 (BP Location: Right Arm)   Pulse 60   Temp 97.7 F (36.5 C) (Oral)   Resp 19   Ht '5\' 2"'$  (1.575 m)   Wt 64.4 kg   SpO2 96%   BMI 25.97 kg/m  Physical Exam Vitals and nursing note reviewed.  Constitutional:      General: She is not in acute distress.    Appearance:  She is well-developed.  HENT:     Head: Normocephalic and atraumatic.  Eyes:     Conjunctiva/sclera: Conjunctivae normal.  Cardiovascular:     Rate and Rhythm: Normal rate and regular rhythm.     Heart sounds: No murmur heard. Pulmonary:     Effort: Pulmonary effort is normal. No respiratory distress.     Breath sounds: Normal breath sounds.  Abdominal:     General: There is no distension.     Palpations: Abdomen is soft.     Tenderness: There is no abdominal tenderness. There is no right CVA tenderness or left CVA tenderness.  Musculoskeletal:        General: No swelling or tenderness. Normal range of motion.     Cervical back: Neck supple.  Skin:    General: Skin is warm and dry.  Neurological:     General: No focal deficit present.     Mental Status: She is alert and oriented to person, place, and time. Mental status is at baseline.     Cranial Nerves: No cranial nerve deficit.     Motor: No weakness.     Coordination: Coordination normal.     Comments: Head impulse test with a corrective saccade to the left. Nystagmus present with leftward beats on rightward gaze Test of skew negative      ED Course/ Medical Decision Making/ A&P  Procedures Procedures   Medications Ordered in ED Medications  lactated ringers bolus 1,000 mL (0 mLs Intravenous Stopped 05/16/22 1358)  meclizine (ANTIVERT) tablet 25 mg (25 mg Oral Given 05/16/22 1212)    Medical Decision Making:    Tamara [name] is a 88 y.o. female who presented to the ED today with dizziness detailed above.     Patient's presentation is complicated by their history of multiple comorbid medical probems.  Patient placed on continuous vitals and telemetry monitoring while in ED which was reviewed periodically.   Complete initial physical exam performed, notably the patient  was HDS in NAD.      Reviewed and confirmed nursing documentation for past medical history, family history, social history.    Initial  Assessment:   With the patient's presentation of dizziness, most likely diagnosis is vertigo. Other diagnoses were considered including (but not limited to) CVA, intracranial hemorrhage, meningitis, encephalitis, metabolic disturbance. These are considered less likely due to history of present illness and physical exam findings.   This is most consistent with an acute life/limb threatening illness complicated by underlying chronic conditions.  Initial Plan:  CT head without contrast to evaluate for intracranial structural pathology Screening labs including CBC and Metabolic panel to evaluate for infectious or metabolic etiology of disease.  EKG to evaluate for cardiac pathology. Objective evaluation as below reviewed with plan for close reassessment  Initial Study Results:   Laboratory  All laboratory results reviewed without evidence of clinically relevant pathology.     EKG EKG was reviewed independently. Rate, rhythm, axis, intervals all examined and without medically relevant abnormality. ST segments without concerns for elevations.    Radiology  All images reviewed independently. Agree with radiology report at this time.   CT HEAD WO CONTRAST (5MM)  Result Date: 05/16/2022 CLINICAL DATA:  Dizziness.  Headache. EXAM: CT HEAD WITHOUT CONTRAST TECHNIQUE: Contiguous axial images were obtained from the base of the skull through the vertex without intravenous contrast. RADIATION DOSE REDUCTION: This exam was performed according to the departmental dose-optimization program which includes automated exposure control, adjustment of the mA and/or kV according to patient size and/or use of iterative reconstruction technique. COMPARISON:  02/09/2017 FINDINGS: Brain: There is no evidence for acute hemorrhage, hydrocephalus, mass lesion, or abnormal extra-axial fluid collection. No definite CT evidence for acute infarction. Diffuse loss of parenchymal volume is consistent with atrophy. Patchy low  attenuation in the deep hemispheric and periventricular white matter is nonspecific, but likely reflects chronic microvascular ischemic demyelination. Vascular: No hyperdense vessel or unexpected calcification. Skull: No evidence for fracture. No worrisome lytic or sclerotic lesion. Sinuses/Orbits: The visualized paranasal sinuses and mastoid air cells are clear. Visualized portions of the globes and intraorbital fat are unremarkable. Other: None. IMPRESSION: 1. Stable.  No acute intracranial abnormality. 2. Atrophy with chronic small vessel ischemic disease. Electronically Signed   By: Misty Stanley M.D.   On: 05/16/2022 12:05     Final Assessment and Plan:   On reassessment, patient was asymptomatic at rest.  Stood patient up with nursing assistance and patient did have another paroxysm of dizziness.  She was able to ambulate to the bathroom with assistance.  Repeat neurologic exam demonstrates no focal neurologic deficits no ataxia but she does have present leftward beats of nystagmus again. Given severity of symptoms, had a shared medical decision making with patient's daughter at bedside.  Offered transfer to [facility] for MRI, further stroke work-up given severity of symptoms.  Family preferred outpatient care  and management with an outpatient neurologic follow-up given duration of symptoms and trial of meclizine as well.  Overall this is reasonable.  We discussed sequelae of a missed stroke and consequences and family stressed understanding and would prefer to follow-up with the primary care and neurology in the outpatient setting.     Disposition:  I have considered need for hospitalization, however, patient's current presentation favors discharge at this time.  Patient/family educated about specific return precautions for given chief complaint and symptoms.  Patient/family educated about follow-up with PCP.     Patient/family expressed understanding of return precautions and need for  follow-up. Patient spoken to regarding all imaging and laboratory results and appropriate follow up for these results. All education provided in verbal form with additional information in written form. Time was allowed for answering of patient questions. Patient discharged.    Emergency Department Medication Summary:   Medications  lactated ringers bolus 1,000 mL (0 mLs Intravenous Stopped 05/16/22 1358)  meclizine (ANTIVERT) tablet 25 mg (25 mg Oral Given 05/16/22 1212)         Clinical Impression:  1. Vertigo   2. Dizziness      Discharge   Final Clinical Impression(s) / ED Diagnoses Final diagnoses:  Vertigo  Dizziness    Rx / DC Orders ED Discharge Orders          Ordered    meclizine (ANTIVERT) 50 MG tablet  3 times daily PRN        05/16/22 1356              Tretha Sciara, MD 05/16/22 1401

## 2022-05-16 NOTE — ED Notes (Signed)
Pt has a Hx of dementia per daughter; However, pt is aware of where she is and situation

## 2022-05-16 NOTE — ED Notes (Signed)
Pt was able to ambulate several feet with minimal assistance

## 2022-06-04 ENCOUNTER — Other Ambulatory Visit (HOSPITAL_COMMUNITY): Payer: Self-pay | Admitting: Hematology

## 2022-06-04 ENCOUNTER — Inpatient Hospital Stay (HOSPITAL_COMMUNITY): Admission: RE | Admit: 2022-06-04 | Payer: PPO | Source: Ambulatory Visit

## 2022-06-04 ENCOUNTER — Inpatient Hospital Stay: Payer: PPO

## 2022-06-04 DIAGNOSIS — Z1231 Encounter for screening mammogram for malignant neoplasm of breast: Secondary | ICD-10-CM

## 2022-06-08 ENCOUNTER — Inpatient Hospital Stay: Payer: PPO | Attending: Hematology

## 2022-06-08 DIAGNOSIS — Z170 Estrogen receptor positive status [ER+]: Secondary | ICD-10-CM

## 2022-06-08 DIAGNOSIS — E538 Deficiency of other specified B group vitamins: Secondary | ICD-10-CM

## 2022-06-08 DIAGNOSIS — Z853 Personal history of malignant neoplasm of breast: Secondary | ICD-10-CM | POA: Diagnosis not present

## 2022-06-08 LAB — COMPREHENSIVE METABOLIC PANEL
ALT: 14 U/L (ref 0–44)
AST: 19 U/L (ref 15–41)
Albumin: 3.7 g/dL (ref 3.5–5.0)
Alkaline Phosphatase: 76 U/L (ref 38–126)
Anion gap: 4 — ABNORMAL LOW (ref 5–15)
BUN: 21 mg/dL (ref 8–23)
CO2: 28 mmol/L (ref 22–32)
Calcium: 8.6 mg/dL — ABNORMAL LOW (ref 8.9–10.3)
Chloride: 109 mmol/L (ref 98–111)
Creatinine, Ser: 0.85 mg/dL (ref 0.44–1.00)
GFR, Estimated: >60 mL/min (ref >60)
Glucose, Bld: 99 mg/dL (ref 70–99)
Potassium: 4.1 mmol/L (ref 3.5–5.1)
Sodium: 141 mmol/L (ref 135–145)
Total Bilirubin: 0.5 mg/dL (ref 0.3–1.2)
Total Protein: 7.2 g/dL (ref 6.5–8.1)

## 2022-06-08 LAB — CBC WITH DIFFERENTIAL/PLATELET
Abs Immature Granulocytes: 0.02 10*3/uL (ref 0.00–0.07)
Basophils Absolute: 0 10*3/uL (ref 0.0–0.1)
Basophils Relative: 1 %
Eosinophils Absolute: 0.1 10*3/uL (ref 0.0–0.5)
Eosinophils Relative: 1 %
HCT: 36.4 % (ref 36.0–46.0)
Hemoglobin: 11.8 g/dL — ABNORMAL LOW (ref 12.0–15.0)
Immature Granulocytes: 0 %
Lymphocytes Relative: 18 %
Lymphs Abs: 1.1 10*3/uL (ref 0.7–4.0)
MCH: 31.4 pg (ref 26.0–34.0)
MCHC: 32.4 g/dL (ref 30.0–36.0)
MCV: 96.8 fL (ref 80.0–100.0)
Monocytes Absolute: 0.7 10*3/uL (ref 0.1–1.0)
Monocytes Relative: 12 %
Neutro Abs: 4.3 10*3/uL (ref 1.7–7.7)
Neutrophils Relative %: 68 %
Platelets: 204 10*3/uL (ref 150–400)
RBC: 3.76 MIL/uL — ABNORMAL LOW (ref 3.87–5.11)
RDW: 13.7 % (ref 11.5–15.5)
WBC: 6.2 10*3/uL (ref 4.0–10.5)
nRBC: 0 % (ref 0.0–0.2)

## 2022-06-08 LAB — VITAMIN B12: Vitamin B-12: 105 pg/mL — ABNORMAL LOW (ref 180–914)

## 2022-06-10 ENCOUNTER — Inpatient Hospital Stay: Payer: PPO | Admitting: Hematology

## 2022-06-10 ENCOUNTER — Inpatient Hospital Stay: Payer: PPO

## 2022-06-10 ENCOUNTER — Ambulatory Visit: Payer: PPO | Admitting: Hematology

## 2022-06-10 ENCOUNTER — Ambulatory Visit: Payer: PPO

## 2022-06-19 ENCOUNTER — Ambulatory Visit (HOSPITAL_COMMUNITY): Payer: PPO

## 2022-07-07 ENCOUNTER — Inpatient Hospital Stay: Payer: PPO

## 2022-07-07 ENCOUNTER — Inpatient Hospital Stay: Payer: PPO | Attending: Hematology | Admitting: Hematology

## 2022-09-17 ENCOUNTER — Encounter: Payer: Self-pay | Admitting: Radiology

## 2022-10-08 ENCOUNTER — Encounter (HOSPITAL_COMMUNITY): Payer: Self-pay

## 2022-10-08 ENCOUNTER — Other Ambulatory Visit: Payer: Self-pay

## 2022-10-08 ENCOUNTER — Emergency Department (HOSPITAL_COMMUNITY): Payer: PPO

## 2022-10-08 ENCOUNTER — Inpatient Hospital Stay (HOSPITAL_COMMUNITY)
Admission: EM | Admit: 2022-10-08 | Discharge: 2022-10-14 | DRG: 522 | Disposition: A | Discharge status: Skilled Nursing Facility | Payer: PPO | Attending: Family Medicine | Admitting: Family Medicine

## 2022-10-08 DIAGNOSIS — R488 Other symbolic dysfunctions: Secondary | ICD-10-CM | POA: Diagnosis not present

## 2022-10-08 DIAGNOSIS — Z79899 Other long term (current) drug therapy: Secondary | ICD-10-CM

## 2022-10-08 DIAGNOSIS — Z809 Family history of malignant neoplasm, unspecified: Secondary | ICD-10-CM | POA: Diagnosis not present

## 2022-10-08 DIAGNOSIS — J8410 Pulmonary fibrosis, unspecified: Secondary | ICD-10-CM | POA: Diagnosis not present

## 2022-10-08 DIAGNOSIS — S72001D Fracture of unspecified part of neck of right femur, subsequent encounter for closed fracture with routine healing: Secondary | ICD-10-CM | POA: Diagnosis not present

## 2022-10-08 DIAGNOSIS — I152 Hypertension secondary to endocrine disorders: Secondary | ICD-10-CM | POA: Diagnosis not present

## 2022-10-08 DIAGNOSIS — S72001A Fracture of unspecified part of neck of right femur, initial encounter for closed fracture: Principal | ICD-10-CM | POA: Diagnosis present

## 2022-10-08 DIAGNOSIS — W19XXXA Unspecified fall, initial encounter: Secondary | ICD-10-CM | POA: Diagnosis not present

## 2022-10-08 DIAGNOSIS — I10 Essential (primary) hypertension: Secondary | ICD-10-CM | POA: Diagnosis not present

## 2022-10-08 DIAGNOSIS — R262 Difficulty in walking, not elsewhere classified: Secondary | ICD-10-CM | POA: Diagnosis not present

## 2022-10-08 DIAGNOSIS — Z9181 History of falling: Secondary | ICD-10-CM | POA: Diagnosis not present

## 2022-10-08 DIAGNOSIS — E119 Type 2 diabetes mellitus without complications: Secondary | ICD-10-CM | POA: Diagnosis not present

## 2022-10-08 DIAGNOSIS — Z87891 Personal history of nicotine dependence: Secondary | ICD-10-CM

## 2022-10-08 DIAGNOSIS — Z833 Family history of diabetes mellitus: Secondary | ICD-10-CM | POA: Diagnosis not present

## 2022-10-08 DIAGNOSIS — E1159 Type 2 diabetes mellitus with other circulatory complications: Secondary | ICD-10-CM | POA: Diagnosis not present

## 2022-10-08 DIAGNOSIS — R0902 Hypoxemia: Secondary | ICD-10-CM | POA: Diagnosis not present

## 2022-10-08 DIAGNOSIS — Z853 Personal history of malignant neoplasm of breast: Secondary | ICD-10-CM | POA: Diagnosis not present

## 2022-10-08 DIAGNOSIS — Y92009 Unspecified place in unspecified non-institutional (private) residence as the place of occurrence of the external cause: Secondary | ICD-10-CM

## 2022-10-08 DIAGNOSIS — E118 Type 2 diabetes mellitus with unspecified complications: Secondary | ICD-10-CM | POA: Diagnosis not present

## 2022-10-08 DIAGNOSIS — F05 Delirium due to known physiological condition: Secondary | ICD-10-CM | POA: Diagnosis not present

## 2022-10-08 DIAGNOSIS — R2681 Unsteadiness on feet: Secondary | ICD-10-CM | POA: Diagnosis not present

## 2022-10-08 DIAGNOSIS — Z043 Encounter for examination and observation following other accident: Secondary | ICD-10-CM | POA: Diagnosis not present

## 2022-10-08 DIAGNOSIS — E538 Deficiency of other specified B group vitamins: Secondary | ICD-10-CM | POA: Diagnosis not present

## 2022-10-08 DIAGNOSIS — N3091 Cystitis, unspecified with hematuria: Secondary | ICD-10-CM

## 2022-10-08 DIAGNOSIS — M810 Age-related osteoporosis without current pathological fracture: Secondary | ICD-10-CM | POA: Diagnosis present

## 2022-10-08 DIAGNOSIS — D509 Iron deficiency anemia, unspecified: Secondary | ICD-10-CM | POA: Diagnosis not present

## 2022-10-08 DIAGNOSIS — R5082 Postprocedural fever: Secondary | ICD-10-CM

## 2022-10-08 DIAGNOSIS — J90 Pleural effusion, not elsewhere classified: Secondary | ICD-10-CM | POA: Diagnosis not present

## 2022-10-08 DIAGNOSIS — Z1152 Encounter for screening for COVID-19: Secondary | ICD-10-CM | POA: Diagnosis not present

## 2022-10-08 DIAGNOSIS — Z96642 Presence of left artificial hip joint: Secondary | ICD-10-CM | POA: Diagnosis not present

## 2022-10-08 DIAGNOSIS — R410 Disorientation, unspecified: Secondary | ICD-10-CM | POA: Diagnosis not present

## 2022-10-08 DIAGNOSIS — R7303 Prediabetes: Secondary | ICD-10-CM

## 2022-10-08 DIAGNOSIS — M1711 Unilateral primary osteoarthritis, right knee: Secondary | ICD-10-CM | POA: Diagnosis not present

## 2022-10-08 DIAGNOSIS — I700 Atherosclerosis of aorta: Secondary | ICD-10-CM | POA: Diagnosis not present

## 2022-10-08 DIAGNOSIS — Z7984 Long term (current) use of oral hypoglycemic drugs: Secondary | ICD-10-CM | POA: Diagnosis not present

## 2022-10-08 DIAGNOSIS — F0390 Unspecified dementia without behavioral disturbance: Secondary | ICD-10-CM | POA: Diagnosis not present

## 2022-10-08 DIAGNOSIS — Z471 Aftercare following joint replacement surgery: Secondary | ICD-10-CM | POA: Diagnosis not present

## 2022-10-08 DIAGNOSIS — M79604 Pain in right leg: Secondary | ICD-10-CM | POA: Diagnosis not present

## 2022-10-08 DIAGNOSIS — R079 Chest pain, unspecified: Secondary | ICD-10-CM | POA: Diagnosis not present

## 2022-10-08 DIAGNOSIS — N3001 Acute cystitis with hematuria: Secondary | ICD-10-CM | POA: Diagnosis not present

## 2022-10-08 DIAGNOSIS — M6281 Muscle weakness (generalized): Secondary | ICD-10-CM | POA: Diagnosis not present

## 2022-10-08 DIAGNOSIS — Z96641 Presence of right artificial hip joint: Secondary | ICD-10-CM | POA: Diagnosis not present

## 2022-10-08 LAB — CBC WITH DIFFERENTIAL/PLATELET
Abs Immature Granulocytes: 0.07 10*3/uL (ref 0.00–0.07)
Basophils Absolute: 0 10*3/uL (ref 0.0–0.1)
Basophils Relative: 0 %
Eosinophils Absolute: 0 10*3/uL (ref 0.0–0.5)
Eosinophils Relative: 0 %
HCT: 36.6 % (ref 36.0–46.0)
Hemoglobin: 12.2 g/dL (ref 12.0–15.0)
Immature Granulocytes: 1 %
Lymphocytes Relative: 5 %
Lymphs Abs: 0.6 10*3/uL — ABNORMAL LOW (ref 0.7–4.0)
MCH: 31.6 pg (ref 26.0–34.0)
MCHC: 33.3 g/dL (ref 30.0–36.0)
MCV: 94.8 fL (ref 80.0–100.0)
Monocytes Absolute: 0.9 10*3/uL (ref 0.1–1.0)
Monocytes Relative: 8 %
Neutro Abs: 10.3 10*3/uL — ABNORMAL HIGH (ref 1.7–7.7)
Neutrophils Relative %: 86 %
Platelets: 195 10*3/uL (ref 150–400)
RBC: 3.86 MIL/uL — ABNORMAL LOW (ref 3.87–5.11)
RDW: 13.9 % (ref 11.5–15.5)
WBC: 12 10*3/uL — ABNORMAL HIGH (ref 4.0–10.5)
nRBC: 0 % (ref 0.0–0.2)

## 2022-10-08 LAB — PROTIME-INR
INR: 1.1 (ref 0.8–1.2)
Prothrombin Time: 13.7 seconds (ref 11.4–15.2)

## 2022-10-08 LAB — BASIC METABOLIC PANEL
Anion gap: 10 (ref 5–15)
BUN: 23 mg/dL (ref 8–23)
CO2: 23 mmol/L (ref 22–32)
Calcium: 8.6 mg/dL — ABNORMAL LOW (ref 8.9–10.3)
Chloride: 104 mmol/L (ref 98–111)
Creatinine, Ser: 0.93 mg/dL (ref 0.44–1.00)
GFR, Estimated: 60 mL/min — ABNORMAL LOW (ref >60)
Glucose, Bld: 120 mg/dL — ABNORMAL HIGH (ref 70–99)
Potassium: 4.3 mmol/L (ref 3.5–5.1)
Sodium: 137 mmol/L (ref 135–145)

## 2022-10-08 LAB — TYPE AND SCREEN
ABO/RH(D): O POS
Antibody Screen: NEGATIVE

## 2022-10-08 MED ORDER — MORPHINE SULFATE (PF) 2 MG/ML IV SOLN
1.0000 mg | INTRAVENOUS | Status: DC | PRN
  Administered 2022-10-08 – 2022-10-11 (×6): 1 mg via INTRAVENOUS
  Filled 2022-10-08 (×6): qty 1

## 2022-10-08 MED ORDER — ONDANSETRON HCL 4 MG/2ML IJ SOLN
4.0000 mg | Freq: Four times a day (QID) | INTRAMUSCULAR | Status: DC | PRN
  Administered 2022-10-09: 4 mg via INTRAVENOUS

## 2022-10-08 MED ORDER — HEPARIN SODIUM (PORCINE) 5000 UNIT/ML IJ SOLN
5000.0000 [IU] | Freq: Once | INTRAMUSCULAR | Status: AC
  Administered 2022-10-08: 5000 [IU] via SUBCUTANEOUS
  Filled 2022-10-08: qty 1

## 2022-10-08 MED ORDER — FENTANYL CITRATE PF 50 MCG/ML IJ SOSY
50.0000 ug | PREFILLED_SYRINGE | INTRAMUSCULAR | Status: AC | PRN
  Administered 2022-10-08 (×2): 50 ug via INTRAVENOUS
  Filled 2022-10-08 (×2): qty 1

## 2022-10-08 MED ORDER — ONDANSETRON HCL 4 MG PO TABS: 4.0000 mg | ORAL_TABLET | Freq: Four times a day (QID) | ORAL | Status: DC | PRN

## 2022-10-08 MED ORDER — TRAZODONE HCL 50 MG PO TABS
50.0000 mg | ORAL_TABLET | Freq: Every evening | ORAL | Status: DC | PRN
  Administered 2022-10-10: 50 mg via ORAL
  Filled 2022-10-08: qty 1

## 2022-10-08 MED ORDER — ACETAMINOPHEN 325 MG PO TABS
650.0000 mg | ORAL_TABLET | Freq: Four times a day (QID) | ORAL | Status: DC | PRN
  Administered 2022-10-10 – 2022-10-11 (×2): 650 mg via ORAL
  Filled 2022-10-08 (×2): qty 2

## 2022-10-08 MED ORDER — POLYETHYLENE GLYCOL 3350 17 G PO PACK: 17.0000 g | PACK | Freq: Every day | ORAL | Status: DC | PRN

## 2022-10-08 MED ORDER — SODIUM CHLORIDE 0.9 % IV SOLN: INTRAVENOUS | Status: AC

## 2022-10-08 MED ORDER — ACETAMINOPHEN 650 MG RE SUPP: 650.0000 mg | Freq: Four times a day (QID) | RECTAL | Status: DC | PRN

## 2022-10-08 NOTE — Consult Note (Signed)
Reason for Consult: fracture femoral neck.  Referring Physician: Celesta Aver  Tamara [name] is an 89 y.o. female.  HPI:    HPI: Tamara [name] is a 89 y.o. female with medical history significant for  DM, HTN, lung Fibrosis, Dementia.  Patient was brought to the ED for possible fall, right leg pain.   On my evaluation, patient is awake alert oriented and able to answer some simple questions but speech intermittently confused.  Daughter- Helene Kelp is at bedside.  Patient lives with her.  She reports patient was sweeping with a broom, when she fell. Daughter showed me a picture of patient lying on her right side on the floor, with the broom on the side.  Patient denies difficulty breathing, no chest pain.  She has had stable oral intake, no vomiting no loose stools.  No dizziness.  At baseline patient ambulates without assistance or assistive devices.  Past Medical History:  Diagnosis Date   B12 deficiency 06/08/2016   Breast cancer ([place])    Breast cancer, right breast ([place]) 04/25/2013   Cancer ([place])    Claustrophobia    Claustrophobia    Dementia ([place])    Hypertension    Iron deficiency anemia 12/15/2016   Malignant neoplasm of upper-outer quadrant of RIGHT female breast ([place]) 04/25/2013   Stage IA (T1b, N0, M0) invasive mucinous breast carcinoma, S/P right breast lumpectomy by Dr. Arnoldo Morale on 03/08/2013 with 1 negative sentinel node. Cancer was identified as ER+ 100%, PR+ 85%, Her2 negative, and Ki-67 marker at 17%. Oncotype Dx score of 20 with 13% 10 year risk of distant recurrence and absolute benefit of chemotherapy at 10 years in this risk category is negligible and less than 1%.   Osteoporosis     Past Surgical History:  Procedure Laterality Date   ABDOMINAL HYSTERECTOMY     CATARACT EXTRACTION, BILATERAL     PARTIAL MASTECTOMY WITH NEEDLE LOCALIZATION AND AXILLARY SENTINEL LYMPH NODE BX Right 03/08/2013   Procedure: PARTIAL MASTECTOMY WITH NEEDLE LOCALIZATION AND AXILLARY SENTINEL LYMPH  NODE BX;  Surgeon: Jamesetta So, MD;  Location: [facility];  Service: General;  Laterality: Right;  Sentinel Node Bx @ 7:30am Needle Loc @ 8:00am    Family History  Problem Relation Age of Onset   Diabetes Mother    Cancer Father    Cancer Sister    Cancer Brother     Social History:  reports that she quit smoking about 55 years ago. Her smoking use included cigarettes. She has a 20.00 pack-year smoking history. She has never used smokeless tobacco. She reports that she does not drink alcohol and does not use drugs.  Allergies: No Known Allergies  Medications:  Current Facility-Administered Medications:    0.9 %  sodium chloride infusion, , Intravenous, Continuous, Carole Civil, MD, Last Rate: 100 mL/hr at 10/09/22 1144, Restarted at 10/09/22 1144   [MAR Hold] acetaminophen (TYLENOL) tablet 650 mg, 650 mg, Oral, Q6H PRN **OR** [MAR Hold] acetaminophen (TYLENOL) suppository 650 mg, 650 mg, Rectal, Q6H PRN, Emokpae, Ejiroghene E, MD   ceFAZolin (ANCEF) 2-4 GM/100ML-% IVPB, , , ,    [START ON 10/10/2022] ceFAZolin (ANCEF) IVPB 2g/100 mL premix, 2 g, Intravenous, On Call to OR, Carole Civil, MD   chlorhexidine (HIBICLENS) 4 % liquid 4 Application, 60 mL, Topical, Once, Carole Civil, MD   lactated ringers infusion, , Intravenous, Continuous, Battula, Rajamani C, MD, Last Rate: 50 mL/hr at 10/09/22 1253, Rate Change at 10/09/22 1253   [facility]  Hold] morphine (PF) 2 MG/ML injection 1 mg, 1 mg, Intravenous, Q4H PRN, Emokpae, Ejiroghene E, MD, 1 mg at 10/09/22 0551   [MAR Hold] ondansetron (ZOFRAN) tablet 4 mg, 4 mg, Oral, Q6H PRN **OR** [MAR Hold] ondansetron (ZOFRAN) injection 4 mg, 4 mg, Intravenous, Q6H PRN, Emokpae, Ejiroghene E, MD   [MAR Hold] polyethylene glycol (MIRALAX / GLYCOLAX) packet 17 g, 17 g, Oral, Daily PRN, Emokpae, Ejiroghene E, MD   tranexamic acid (CYKLOKAPRON) 1000MG /164mL IVPB, , , ,    tranexamic acid (CYKLOKAPRON) IVPB 1,000 mg, 1,000 mg, Intravenous, To OR,  Carole Civil, MD   [MAR Hold] traZODone (DESYREL) tablet 50 mg, 50 mg, Oral, QHS PRN, Emokpae, Ejiroghene E, MD   Results for orders placed or performed during the hospital encounter of 10/08/22 (from the past 48 hour(s))  Basic metabolic panel     Status: Abnormal   Collection Time: 10/08/22  4:12 PM  Result Value Ref Range   Sodium 137 135 - 145 mmol/L   Potassium 4.3 3.5 - 5.1 mmol/L   Chloride 104 98 - 111 mmol/L   CO2 23 22 - 32 mmol/L   Glucose, Bld 120 (H) 70 - 99 mg/dL    Comment: Glucose reference range applies only to samples taken after fasting for at least 8 hours.   BUN 23 8 - 23 mg/dL   Creatinine, Ser 0.93 0.44 - 1.00 mg/dL   Calcium 8.6 (L) 8.9 - 10.3 mg/dL   GFR, Estimated 60 (L) >60 mL/min    Comment: (NOTE) Calculated using the CKD-EPI Creatinine Equation (2021)    Anion gap 10 5 - 15    Comment: Performed at [facility], [address], [place], [place] [address]  CBC with Differential     Status: Abnormal   Collection Time: 10/08/22  4:12 PM  Result Value Ref Range   WBC 12.0 (H) 4.0 - 10.5 K/uL   RBC 3.86 (L) 3.87 - 5.11 MIL/uL   Hemoglobin 12.2 12.0 - 15.0 g/dL   HCT 36.6 36.0 - 46.0 %   MCV 94.8 80.0 - 100.0 fL   MCH 31.6 26.0 - 34.0 pg   MCHC 33.3 30.0 - 36.0 g/dL   RDW 13.9 11.5 - 15.5 %   Platelets 195 150 - 400 K/uL   nRBC 0.0 0.0 - 0.2 %   Neutrophils Relative % 86 %   Neutro Abs 10.3 (H) 1.7 - 7.7 K/uL   Lymphocytes Relative 5 %   Lymphs Abs 0.6 (L) 0.7 - 4.0 K/uL   Monocytes Relative 8 %   Monocytes Absolute 0.9 0.1 - 1.0 K/uL   Eosinophils Relative 0 %   Eosinophils Absolute 0.0 0.0 - 0.5 K/uL   Basophils Relative 0 %   Basophils Absolute 0.0 0.0 - 0.1 K/uL   Immature Granulocytes 1 %   Abs Immature Granulocytes 0.07 0.00 - 0.07 K/uL    Comment: Performed at [facility], [address] [place], [place] [address]  Protime-INR     Status: None   Collection Time: 10/08/22  4:12 PM  Result Value Ref Range   Prothrombin Time 13.7  11.4 - 15.2 seconds   INR 1.1 0.8 - 1.2    Comment: (NOTE) INR goal varies based on device and disease states. Performed at [facility], [address] [place], [place] [address]   Type and screen Ordered by PROVIDER DEFAULT     Status: None   Collection Time: 10/08/22  4:41 PM  Result Value Ref Range  ABO/RH(D) O POS    Antibody Screen NEG    Sample Expiration      10/11/2022,2359 Performed at [facility], [address], [place], [place] [address]     CT Head Wo Contrast  Result Date: 10/08/2022 CLINICAL DATA:  [place] [address] onto floor, dimension EXAM: CT HEAD WITHOUT CONTRAST TECHNIQUE: Contiguous axial images were obtained from the base of the skull through the vertex without intravenous contrast. RADIATION DOSE REDUCTION: This exam was performed according to the departmental dose-optimization program which includes automated exposure control, adjustment of the mA and/or kV according to patient size and/or use of iterative reconstruction technique. COMPARISON:  05/16/2022 FINDINGS: Brain: No acute infarct or hemorrhage. Stable chronic small-vessel ischemic changes throughout the periventricular white matter. Lateral ventricles and midline structures are unremarkable. No acute extra-axial fluid collections. No mass effect. Vascular: No hyperdense vessel or unexpected calcification. Skull: Normal. Negative for fracture or focal lesion. Sinuses/Orbits: Chronic sphenoid sinus mucosal thickening. No gas fluid levels. Other: None. IMPRESSION: 1. Stable head CT, no acute intracranial process. Electronically Signed   By: Randa Ngo M.D.   On: 10/08/2022 18:17   DG Knee 2 Views Right  Result Date: 10/08/2022 CLINICAL DATA:  fall pain EXAM: RIGHT KNEE - 2 VIEW COMPARISON:  None Available. FINDINGS: Osseous structures are osteopenic. No evidence of fracture, dislocation, or joint effusion. There is tricompartmental joint space narrowing, sclerosis and osteophytes consistent with degenerative joint  disease. No evidence of effusion. IMPRESSION: Degenerative changes. Osteopenia. Electronically Signed   By: Sammie Bench M.D.   On: 10/08/2022 17:45   DG Hip Unilat With Pelvis 2-3 Views Right  Result Date: 10/08/2022 CLINICAL DATA:  Fall, pain EXAM: DG HIP (WITH OR WITHOUT PELVIS) 2-3V RIGHT COMPARISON:  None Available. FINDINGS: Right femoral neck subcapital fracture with varus angulation. Osseous structures are osteopenic. Pelvic ring appears intact PAC. Lumbosacral degenerative changes. IMPRESSION: Right femoral neck fracture. Electronically Signed   By: Sammie Bench M.D.   On: 10/08/2022 17:44   DG Chest 1 View  Result Date: 10/08/2022 CLINICAL DATA:  Fall, pain EXAM: CHEST  1 VIEW COMPARISON:  07/21/2018 FINDINGS: The heart size and mediastinal contours are within normal limits. Both lungs are clear. Aorta is calcified. Osseous structures are not well evaluated. IMPRESSION: No active disease. Electronically Signed   By: Sammie Bench M.D.   On: 10/08/2022 17:42    Review of Systems  Unable to perform ROS: Dementia   Blood pressure 129/68, pulse 92, temperature 98.1 F (36.7 C), temperature source Oral, resp. rate 18, weight 64.4 kg, SpO2 91 %. Physical Exam Vitals and nursing note reviewed.  Constitutional:      General: She is not in acute distress.    Appearance: Normal appearance. She is normal weight. She is not ill-appearing, toxic-appearing or diaphoretic.  HENT:     Head: Normocephalic and atraumatic.     Right Ear: External ear normal.     Left Ear: External ear normal.     Nose: Nose normal. No congestion or rhinorrhea.     Mouth/Throat:     Mouth: Mucous membranes are moist.     Pharynx: No oropharyngeal exudate or posterior oropharyngeal erythema.  Eyes:     General: No scleral icterus.       Right eye: No discharge.        Left eye: No discharge.     Extraocular Movements: Extraocular movements intact.     Conjunctiva/sclera: Conjunctivae normal.      Pupils: Pupils are equal,  round, and reactive to light.  Cardiovascular:     Rate and Rhythm: Normal rate and regular rhythm.     Heart sounds: Normal heart sounds.  Pulmonary:     Effort: Pulmonary effort is normal. No respiratory distress.     Breath sounds: Normal breath sounds. No stridor.  Chest:     Chest wall: No tenderness.  Abdominal:     General: Abdomen is flat. Bowel sounds are normal. There is no distension.     Palpations: Abdomen is soft. There is no mass.     Tenderness: There is no abdominal tenderness.  Musculoskeletal:     Cervical back: Normal range of motion and neck supple. No rigidity or tenderness.     Comments: I do not find any problems with her upper extremities there is no tenderness the range of motion is normal there is no instability there is no joint subluxation muscle tone is normal without tremor  On the left side in the lower extremity she has no tenderness or pain to palpation range of motion is normal in the hip knee and ankle there is no instability or subluxation of those joints and the muscle tone is normal there is no tremor  On the right side I find a shortened externally rotated right lower extremity with tenderness around the hip and greater trochanter.  Stability tests of the knee and ankle were deferred in the hip is fractured.  Muscle tone is normal there is no tremor  Lymphadenopathy:     Cervical: No cervical adenopathy.  Skin:    General: Skin is warm and dry.     Capillary Refill: Capillary refill takes less than 2 seconds.  Neurological:     General: No focal deficit present.     Mental Status: She is alert and oriented to person, place, and time.     Cranial Nerves: No cranial nerve deficit.     Sensory: No sensory deficit.     Motor: No weakness.     Gait: Gait abnormal.     Deep Tendon Reflexes: Reflexes normal.  Psychiatric:        Mood and Affect: Mood normal.        Behavior: Behavior normal.        Thought Content: Thought  content normal.        Judgment: Judgment normal.     Assessment/Plan:  The patient is noted to have dementia I spoke to her daughter relayed to her the risk and benefits of surgery versus nonoperative treatment  The procedure has been fully reviewed with the patient; The risks and benefits of surgery have been discussed and explained and understood. Alternative treatment has also been reviewed, questions were encouraged and answered. The postoperative plan is also been reviewed.   Our plan is to a partial hip replacement on the right for right hip fracture of the femoral neck and this 89 year old female who has dementia but was previously ambulatory Arther Abbott 10/08/2022, 8:06 PM

## 2022-10-08 NOTE — Assessment & Plan Note (Signed)
Stable. -Resume losartan and Norvasc in a.m.

## 2022-10-08 NOTE — H&P (Signed)
History and Physical    Tamara [name] [id number] DOB: 1933-11-05 DOA: 10/08/2022  PCP: [name], MD    Patient coming from: Home  I have personally briefly reviewed patient's old medical records in [place]  Chief Complaint: Fall, Right hip pain  HPI: Tamara [name] is a 89 y.o. female with medical history significant for  DM, HTN, lung Fibrosis, Dementia.  Patient was brought to the ED for possible fall, right leg pain.   On my evaluation, patient is awake alert oriented and able to answer some simple questions but speech intermittently confused.  Daughter- Helene Kelp is at bedside.  Patient lives with her.  She reports patient was sweeping with a broom, when she fell. Daughter showed me a picture of patient lying on her right side on the floor, with the broom on the side.  Patient denies difficulty breathing, no chest pain.  She has had stable oral intake, no vomiting no loose stools.  No dizziness.  At baseline patient ambulates without assistance or assistive devices.  ED Course: Stable vitals.  WBC 12. Head CT negative for acute abnormality, right knee x-ray negative.  Pelvic x-ray shows right femoral neck fracture. EDP Dr. Aline Brochure, recommended admission, n.p.o. midnight, unknown at this time if surgery tomorrow.  Review of Systems: As per HPI all other systems reviewed and negative.  Past Medical History:  Diagnosis Date   B12 deficiency 06/08/2016   Breast cancer ([place])    Breast cancer, right breast ([place]) 04/25/2013   Cancer ([place])    Claustrophobia    Claustrophobia    Dementia ([place])    Hypertension    Iron deficiency anemia 12/15/2016   Malignant neoplasm of upper-outer quadrant of RIGHT female breast ([place]) 04/25/2013   Stage IA (T1b, N0, M0) invasive mucinous breast carcinoma, S/P right breast lumpectomy by Dr. Arnoldo Morale on 03/08/2013 with 1 negative sentinel node. Cancer was identified as ER+ 100%, PR+ 85%, Her2 negative, and Ki-67 marker at 17%. Oncotype Dx score of 20  with 13% 10 year risk of distant recurrence and absolute benefit of chemotherapy at 10 years in this risk category is negligible and less than 1%.   Osteoporosis     Past Surgical History:  Procedure Laterality Date   ABDOMINAL HYSTERECTOMY     CATARACT EXTRACTION, BILATERAL     PARTIAL MASTECTOMY WITH NEEDLE LOCALIZATION AND AXILLARY SENTINEL LYMPH NODE BX Right 03/08/2013   Procedure: PARTIAL MASTECTOMY WITH NEEDLE LOCALIZATION AND AXILLARY SENTINEL LYMPH NODE BX;  Surgeon: Jamesetta So, MD;  Location: [facility];  Service: General;  Laterality: Right;  Sentinel Node Bx @ 7:30am Needle Loc @ 8:00am     reports that she quit smoking about 55 years ago. Her smoking use included cigarettes. She has a 20.00 pack-year smoking history. She has never used smokeless tobacco. She reports that she does not drink alcohol and does not use drugs.  No Known Allergies  Family History  Problem Relation Age of Onset   Diabetes Mother    Cancer Father    Cancer Sister    Cancer Brother     Prior to Admission medications   Medication Sig Start Date End Date Taking? Authorizing Provider  amLODipine (NORVASC) 5 MG tablet Take 5 mg by mouth daily.    [provider]  calcium-vitamin D (OSCAL WITH D) 500-200 MG-UNIT per tablet Take 2 tablets by mouth daily with breakfast. 04/25/13   Baird Cancer, PA-C  donepezil (ARICEPT) 10 MG tablet Take 10 mg  by mouth daily. 06/09/17   [provider]  losartan (COZAAR) 25 MG tablet Take 25 mg by mouth daily. 12/09/16   [provider]  meclizine (ANTIVERT) 50 MG tablet Take 1 tablet (50 mg total) by mouth 3 (three) times daily as needed. 05/16/22   Tretha Sciara, MD  NEEDLE, DISP, 27 G (BD DISP NEEDLES) 27G X 1/2" MISC 1 Syringe by Does not apply route every 30 (thirty) days. 11/28/19   Glennie Isle, NP-C    Physical Exam: Vitals:   10/08/22 1730 10/08/22 1752 10/08/22 1800 10/08/22 1830  BP: 132/70 125/69 109/64 111/72  Pulse:  92 87 83 94  Resp: 18 14 14 17   Temp:      TempSrc:      SpO2: 94% 94% 93% 93%  Weight:        Constitutional: NAD, calm, comfortable Vitals:   10/08/22 1730 10/08/22 1752 10/08/22 1800 10/08/22 1830  BP: 132/70 125/69 109/64 111/72  Pulse: 92 87 83 94  Resp: 18 14 14 17   Temp:      TempSrc:      SpO2: 94% 94% 93% 93%  Weight:       Eyes: PERRL, lids and conjunctivae normal ENMT: Mucous membranes are mildly dry  Neck: normal, supple, no masses, no thyromegaly Respiratory: clear to auscultation bilaterally, no wheezing, no crackles. Normal respiratory effort. No accessory muscle use.  Cardiovascular: Regular rate and rhythm, no murmurs / rubs / gallops. No extremity edema.  Extremities warm. Abdomen: no tenderness, no masses palpated. No hepatosplenomegaly. Bowel sounds positive.  Musculoskeletal: no clubbing / cyanosis. No joint deformity upper and lower extremities.  Right lower extremity shortened and externally rotated. Skin: no rashes, lesions, ulcers. No induration Neurologic: No facial asymmetry, speech fluent without aphasia, able to move upper extremities, left lower extremity limited motion due to pain in right hip.Marland Kitchen  Psychiatric: Awake, alert oriented to person and place.  Speech intermittently confused..   Labs on Admission: I have personally reviewed following labs and imaging studies  CBC: Recent Labs  Lab 10/08/22 1612  WBC 12.0*  NEUTROABS 10.3*  HGB 12.2  HCT 36.6  MCV 94.8  PLT 0000000   Basic Metabolic Panel: Recent Labs  Lab 10/08/22 1612  NA 137  K 4.3  CL 104  CO2 23  GLUCOSE 120*  BUN 23  CREATININE 0.93  CALCIUM 8.6*   Coagulation Profile: Recent Labs  Lab 10/08/22 1612  INR 1.1    Radiological Exams on Admission: CT Head Wo Contrast  Result Date: 10/08/2022 CLINICAL DATA:  [place] onto floor, dimension EXAM: CT HEAD WITHOUT CONTRAST TECHNIQUE: Contiguous axial images were obtained from the base of the skull through the vertex without  intravenous contrast. RADIATION DOSE REDUCTION: This exam was performed according to the departmental dose-optimization program which includes automated exposure control, adjustment of the mA and/or kV according to patient size and/or use of iterative reconstruction technique. COMPARISON:  05/16/2022 FINDINGS: Brain: No acute infarct or hemorrhage. Stable chronic small-vessel ischemic changes throughout the periventricular white matter. Lateral ventricles and midline structures are unremarkable. No acute extra-axial fluid collections. No mass effect. Vascular: No hyperdense vessel or unexpected calcification. Skull: Normal. Negative for fracture or focal lesion. Sinuses/Orbits: Chronic sphenoid sinus mucosal thickening. No gas fluid levels. Other: None. IMPRESSION: 1. Stable head CT, no acute intracranial process. Electronically Signed   By: Randa Ngo M.D.   On: 10/08/2022 18:17   DG Knee 2 Views Right  Result Date: 10/08/2022 CLINICAL DATA:  fall pain EXAM: RIGHT KNEE - 2 VIEW COMPARISON:  None Available. FINDINGS: Osseous structures are osteopenic. No evidence of fracture, dislocation, or joint effusion. There is tricompartmental joint space narrowing, sclerosis and osteophytes consistent with degenerative joint disease. No evidence of effusion. IMPRESSION: Degenerative changes. Osteopenia. Electronically Signed   By: Sammie Bench M.D.   On: 10/08/2022 17:45   DG Hip Unilat With Pelvis 2-3 Views Right  Result Date: 10/08/2022 CLINICAL DATA:  Fall, pain EXAM: DG HIP (WITH OR WITHOUT PELVIS) 2-3V RIGHT COMPARISON:  None Available. FINDINGS: Right femoral neck subcapital fracture with varus angulation. Osseous structures are osteopenic. Pelvic ring appears intact PAC. Lumbosacral degenerative changes. IMPRESSION: Right femoral neck fracture. Electronically Signed   By: Sammie Bench M.D.   On: 10/08/2022 17:44   DG Chest 1 View  Result Date: 10/08/2022 CLINICAL DATA:  Fall, pain EXAM: CHEST  1  VIEW COMPARISON:  07/21/2018 FINDINGS: The heart size and mediastinal contours are within normal limits. Both lungs are clear. Aorta is calcified. Osseous structures are not well evaluated. IMPRESSION: No active disease. Electronically Signed   By: Sammie Bench M.D.   On: 10/08/2022 17:42    EKG: Independently reviewed.  Sinus rhythm rate 79, QTc 451.  No significant change from prior.  Assessment/Plan Principal Problem:   Closed displaced fracture of right femoral neck (HCC) Active Problems:   Fibrosis lung (HCC)   Controlled diabetes mellitus type II without complication (HCC)   Essential (primary) hypertension   Dementia (HCC)   Fall at home, initial encounter    Assessment and Plan: * Closed displaced fracture of right femoral neck (HCC) Status post mechanical fall, while patient was sleeping.  Sustaining right femoral neck fracture. - EDP to Dr. Aline Brochure, will see in consult in a.m, at this time unknown if surgery will be tomorrow -N.p.o. midnight -IV morphine 1 mg every 4 hourly as needed for now, okay to increase dose if uncontrolled pain - N/s 75cc/hr x 15hrs  Fibrosis lung ([place]) On room air.  No dyspnea.  Chest x-ray clear.  Fall at home, initial encounter Unwitnessed fall.  Sustaining right femoral neck fracture.  Head CT, right knee x-ray negative for acute abnormality.  Dementia [facility]) Patient able to answer questions, recognizes family.  Lives with daughter.  Ambulates without assistance or assistive devices.  Daughter reports episodes of sundowning confusion, some evenings. -Resume donepezil  Essential (primary) hypertension Stable. -Resume losartan and Norvasc in a.m.  Controlled diabetes mellitus type II without complication (HCC) Not on medication. - Hgab1c - Daily fasting CBG   DVT prophylaxis: Heparin X 1 dose tonight,continue SCDs pending Ortho plans of surgery Code Status:  FULL Code- Confirmed with daughterHelene Kelp at bedside. Family  Communication: Daughter- Clarene Critchley is HCPOA.  Disposition Plan:  ~ 2 days Consults called:  Ortho Admission status: Inpt Med-surg I certify that at the point of admission it is my clinical judgment that the patient will require inpatient hospital care spanning beyond 2 midnights from the point of admission due to high intensity of service, high risk for further deterioration and high frequency of surveillance required.    Author: Bethena Roys, MD 10/08/2022 7:30 PM  For on call review [email or web address].

## 2022-10-08 NOTE — ED Triage Notes (Signed)
Pt c/o  right leg pain. EMS reports pt fell at home while sweeping. Right leg appears shortened and rotated out.

## 2022-10-08 NOTE — Assessment & Plan Note (Signed)
Patient able to answer questions, recognizes family.  Lives with daughter.  Ambulates without assistance or assistive devices.  Daughter reports episodes of sundowning confusion, some evenings. -Resume donepezil

## 2022-10-08 NOTE — Assessment & Plan Note (Signed)
On room air.  No dyspnea.  Chest x-ray clear.

## 2022-10-08 NOTE — Assessment & Plan Note (Addendum)
Status post mechanical fall, while patient was sleeping.  Sustaining right femoral neck fracture. - EDP to Dr. Aline Brochure, will see in consult in a.m, at this time unknown if surgery will be tomorrow -N.p.o. midnight -IV morphine 1 mg every 4 hourly as needed for now, okay to increase dose if uncontrolled pain - N/s 75cc/hr x 15hrs

## 2022-10-08 NOTE — Assessment & Plan Note (Signed)
Unwitnessed fall.  Sustaining right femoral neck fracture.  Head CT, right knee x-ray negative for acute abnormality.

## 2022-10-08 NOTE — H&P (View-Only) (Signed)
Reason for Consult: fracture femoral neck.  Referring Physician: Celesta Aver  Tamara [name] is an 89 y.o. female.  HPI:    HPI: Tamara [name] is a 89 y.o. female with medical history significant for  DM, HTN, lung Fibrosis, Dementia.  Patient was brought to the ED for possible fall, right leg pain.   On my evaluation, patient is awake alert oriented and able to answer some simple questions but speech intermittently confused.  Daughter- Helene Kelp is at bedside.  Patient lives with her.  She reports patient was sweeping with a broom, when she fell. Daughter showed me a picture of patient lying on her right side on the floor, with the broom on the side.  Patient denies difficulty breathing, no chest pain.  She has had stable oral intake, no vomiting no loose stools.  No dizziness.  At baseline patient ambulates without assistance or assistive devices.  Past Medical History:  Diagnosis Date   B12 deficiency 06/08/2016   Breast cancer ([place])    Breast cancer, right breast ([place]) 04/25/2013   Cancer ([place])    Claustrophobia    Claustrophobia    Dementia ([place])    Hypertension    Iron deficiency anemia 12/15/2016   Malignant neoplasm of upper-outer quadrant of RIGHT female breast ([place]) 04/25/2013   Stage IA (T1b, N0, M0) invasive mucinous breast carcinoma, S/P right breast lumpectomy by Dr. Arnoldo Morale on 03/08/2013 with 1 negative sentinel node. Cancer was identified as ER+ 100%, PR+ 85%, Her2 negative, and Ki-67 marker at 17%. Oncotype Dx score of 20 with 13% 10 year risk of distant recurrence and absolute benefit of chemotherapy at 10 years in this risk category is negligible and less than 1%.   Osteoporosis     Past Surgical History:  Procedure Laterality Date   ABDOMINAL HYSTERECTOMY     CATARACT EXTRACTION, BILATERAL     PARTIAL MASTECTOMY WITH NEEDLE LOCALIZATION AND AXILLARY SENTINEL LYMPH NODE BX Right 03/08/2013   Procedure: PARTIAL MASTECTOMY WITH NEEDLE LOCALIZATION AND AXILLARY SENTINEL LYMPH  NODE BX;  Surgeon: Jamesetta So, MD;  Location: [facility];  Service: General;  Laterality: Right;  Sentinel Node Bx @ 7:30am Needle Loc @ 8:00am    Family History  Problem Relation Age of Onset   Diabetes Mother    Cancer Father    Cancer Sister    Cancer Brother     Social History:  reports that she quit smoking about 55 years ago. Her smoking use included cigarettes. She has a 20.00 pack-year smoking history. She has never used smokeless tobacco. She reports that she does not drink alcohol and does not use drugs.  Allergies: No Known Allergies  Medications:  Current Facility-Administered Medications:    0.9 %  sodium chloride infusion, , Intravenous, Continuous, Carole Civil, MD, Last Rate: 100 mL/hr at 10/09/22 1144, Restarted at 10/09/22 1144   [MAR Hold] acetaminophen (TYLENOL) tablet 650 mg, 650 mg, Oral, Q6H PRN **OR** [MAR Hold] acetaminophen (TYLENOL) suppository 650 mg, 650 mg, Rectal, Q6H PRN, Emokpae, Ejiroghene E, MD   ceFAZolin (ANCEF) 2-4 GM/100ML-% IVPB, , , ,    [START ON 10/10/2022] ceFAZolin (ANCEF) IVPB 2g/100 mL premix, 2 g, Intravenous, On Call to OR, Carole Civil, MD   chlorhexidine (HIBICLENS) 4 % liquid 4 Application, 60 mL, Topical, Once, Carole Civil, MD   lactated ringers infusion, , Intravenous, Continuous, Battula, Rajamani C, MD, Last Rate: 50 mL/hr at 10/09/22 1253, Rate Change at 10/09/22 1253   [facility]  Hold] morphine (PF) 2 MG/ML injection 1 mg, 1 mg, Intravenous, Q4H PRN, Emokpae, Ejiroghene E, MD, 1 mg at 10/09/22 0551   [MAR Hold] ondansetron (ZOFRAN) tablet 4 mg, 4 mg, Oral, Q6H PRN **OR** [MAR Hold] ondansetron (ZOFRAN) injection 4 mg, 4 mg, Intravenous, Q6H PRN, Emokpae, Ejiroghene E, MD   [MAR Hold] polyethylene glycol (MIRALAX / GLYCOLAX) packet 17 g, 17 g, Oral, Daily PRN, Emokpae, Ejiroghene E, MD   tranexamic acid (CYKLOKAPRON) 1000MG /176mL IVPB, , , ,    tranexamic acid (CYKLOKAPRON) IVPB 1,000 mg, 1,000 mg, Intravenous, To OR,  Carole Civil, MD   [MAR Hold] traZODone (DESYREL) tablet 50 mg, 50 mg, Oral, QHS PRN, Emokpae, Ejiroghene E, MD   Results for orders placed or performed during the hospital encounter of 10/08/22 (from the past 48 hour(s))  Basic metabolic panel     Status: Abnormal   Collection Time: 10/08/22  4:12 PM  Result Value Ref Range   Sodium 137 135 - 145 mmol/L   Potassium 4.3 3.5 - 5.1 mmol/L   Chloride 104 98 - 111 mmol/L   CO2 23 22 - 32 mmol/L   Glucose, Bld 120 (H) 70 - 99 mg/dL    Comment: Glucose reference range applies only to samples taken after fasting for at least 8 hours.   BUN 23 8 - 23 mg/dL   Creatinine, Ser 0.93 0.44 - 1.00 mg/dL   Calcium 8.6 (L) 8.9 - 10.3 mg/dL   GFR, Estimated 60 (L) >60 mL/min    Comment: (NOTE) Calculated using the CKD-EPI Creatinine Equation (2021)    Anion gap 10 5 - 15    Comment: Performed at [facility], [address] [place], [place] [address]  CBC with Differential     Status: Abnormal   Collection Time: 10/08/22  4:12 PM  Result Value Ref Range   WBC 12.0 (H) 4.0 - 10.5 K/uL   RBC 3.86 (L) 3.87 - 5.11 MIL/uL   Hemoglobin 12.2 12.0 - 15.0 g/dL   HCT 36.6 36.0 - 46.0 %   MCV 94.8 80.0 - 100.0 fL   MCH 31.6 26.0 - 34.0 pg   MCHC 33.3 30.0 - 36.0 g/dL   RDW 13.9 11.5 - 15.5 %   Platelets 195 150 - 400 K/uL   nRBC 0.0 0.0 - 0.2 %   Neutrophils Relative % 86 %   Neutro Abs 10.3 (H) 1.7 - 7.7 K/uL   Lymphocytes Relative 5 %   Lymphs Abs 0.6 (L) 0.7 - 4.0 K/uL   Monocytes Relative 8 %   Monocytes Absolute 0.9 0.1 - 1.0 K/uL   Eosinophils Relative 0 %   Eosinophils Absolute 0.0 0.0 - 0.5 K/uL   Basophils Relative 0 %   Basophils Absolute 0.0 0.0 - 0.1 K/uL   Immature Granulocytes 1 %   Abs Immature Granulocytes 0.07 0.00 - 0.07 K/uL    Comment: Performed at [facility], [address] [place], [place] [address]  Protime-INR     Status: None   Collection Time: 10/08/22  4:12 PM  Result Value Ref Range   Prothrombin Time 13.7  11.4 - 15.2 seconds   INR 1.1 0.8 - 1.2    Comment: (NOTE) INR goal varies based on device and disease states. Performed at [facility], [address], [place], [place] [address]   Type and screen Ordered by PROVIDER DEFAULT     Status: None   Collection Time: 10/08/22  4:41 PM  Result Value Ref Range  ABO/RH(D) O POS    Antibody Screen NEG    Sample Expiration      10/11/2022,2359 Performed at [facility], [address] [place],  [address]     CT Head Wo Contrast  Result Date: 10/08/2022 CLINICAL DATA:  [place] onto floor, dimension EXAM: CT HEAD WITHOUT CONTRAST TECHNIQUE: Contiguous axial images were obtained from the base of the skull through the vertex without intravenous contrast. RADIATION DOSE REDUCTION: This exam was performed according to the departmental dose-optimization program which includes automated exposure control, adjustment of the mA and/or kV according to patient size and/or use of iterative reconstruction technique. COMPARISON:  05/16/2022 FINDINGS: Brain: No acute infarct or hemorrhage. Stable chronic small-vessel ischemic changes throughout the periventricular white matter. Lateral ventricles and midline structures are unremarkable. No acute extra-axial fluid collections. No mass effect. Vascular: No hyperdense vessel or unexpected calcification. Skull: Normal. Negative for fracture or focal lesion. Sinuses/Orbits: Chronic sphenoid sinus mucosal thickening. No gas fluid levels. Other: None. IMPRESSION: 1. Stable head CT, no acute intracranial process. Electronically Signed   By: Randa Ngo M.D.   On: 10/08/2022 18:17   DG Knee 2 Views Right  Result Date: 10/08/2022 CLINICAL DATA:  fall pain EXAM: RIGHT KNEE - 2 VIEW COMPARISON:  None Available. FINDINGS: Osseous structures are osteopenic. No evidence of fracture, dislocation, or joint effusion. There is tricompartmental joint space narrowing, sclerosis and osteophytes consistent with degenerative joint  disease. No evidence of effusion. IMPRESSION: Degenerative changes. Osteopenia. Electronically Signed   By: Sammie Bench M.D.   On: 10/08/2022 17:45   DG Hip Unilat With Pelvis 2-3 Views Right  Result Date: 10/08/2022 CLINICAL DATA:  Fall, pain EXAM: DG HIP (WITH OR WITHOUT PELVIS) 2-3V RIGHT COMPARISON:  None Available. FINDINGS: Right femoral neck subcapital fracture with varus angulation. Osseous structures are osteopenic. Pelvic ring appears intact PAC. Lumbosacral degenerative changes. IMPRESSION: Right femoral neck fracture. Electronically Signed   By: Sammie Bench M.D.   On: 10/08/2022 17:44   DG Chest 1 View  Result Date: 10/08/2022 CLINICAL DATA:  Fall, pain EXAM: CHEST  1 VIEW COMPARISON:  07/21/2018 FINDINGS: The heart size and mediastinal contours are within normal limits. Both lungs are clear. Aorta is calcified. Osseous structures are not well evaluated. IMPRESSION: No active disease. Electronically Signed   By: Sammie Bench M.D.   On: 10/08/2022 17:42    Review of Systems  Unable to perform ROS: Dementia   Blood pressure 129/68, pulse 92, temperature 98.1 F (36.7 C), temperature source Oral, resp. rate 18, weight 64.4 kg, SpO2 91 %. Physical Exam Vitals and nursing note reviewed.  Constitutional:      General: She is not in acute distress.    Appearance: Normal appearance. She is normal weight. She is not ill-appearing, toxic-appearing or diaphoretic.  HENT:     Head: Normocephalic and atraumatic.     Right Ear: External ear normal.     Left Ear: External ear normal.     Nose: Nose normal. No congestion or rhinorrhea.     Mouth/Throat:     Mouth: Mucous membranes are moist.     Pharynx: No oropharyngeal exudate or posterior oropharyngeal erythema.  Eyes:     General: No scleral icterus.       Right eye: No discharge.        Left eye: No discharge.     Extraocular Movements: Extraocular movements intact.     Conjunctiva/sclera: Conjunctivae normal.      Pupils: Pupils are equal,  round, and reactive to light.  Cardiovascular:     Rate and Rhythm: Normal rate and regular rhythm.     Heart sounds: Normal heart sounds.  Pulmonary:     Effort: Pulmonary effort is normal. No respiratory distress.     Breath sounds: Normal breath sounds. No stridor.  Chest:     Chest wall: No tenderness.  Abdominal:     General: Abdomen is flat. Bowel sounds are normal. There is no distension.     Palpations: Abdomen is soft. There is no mass.     Tenderness: There is no abdominal tenderness.  Musculoskeletal:     Cervical back: Normal range of motion and neck supple. No rigidity or tenderness.     Comments: I do not find any problems with her upper extremities there is no tenderness the range of motion is normal there is no instability there is no joint subluxation muscle tone is normal without tremor  On the left side in the lower extremity she has no tenderness or pain to palpation range of motion is normal in the hip knee and ankle there is no instability or subluxation of those joints and the muscle tone is normal there is no tremor  On the right side I find a shortened externally rotated right lower extremity with tenderness around the hip and greater trochanter.  Stability tests of the knee and ankle were deferred in the hip is fractured.  Muscle tone is normal there is no tremor  Lymphadenopathy:     Cervical: No cervical adenopathy.  Skin:    General: Skin is warm and dry.     Capillary Refill: Capillary refill takes less than 2 seconds.  Neurological:     General: No focal deficit present.     Mental Status: She is alert and oriented to person, place, and time.     Cranial Nerves: No cranial nerve deficit.     Sensory: No sensory deficit.     Motor: No weakness.     Gait: Gait abnormal.     Deep Tendon Reflexes: Reflexes normal.  Psychiatric:        Mood and Affect: Mood normal.        Behavior: Behavior normal.        Thought Content: Thought  content normal.        Judgment: Judgment normal.     Assessment/Plan:  The patient is noted to have dementia I spoke to her daughter relayed to her the risk and benefits of surgery versus nonoperative treatment  The procedure has been fully reviewed with the patient; The risks and benefits of surgery have been discussed and explained and understood. Alternative treatment has also been reviewed, questions were encouraged and answered. The postoperative plan is also been reviewed.   Our plan is to a partial hip replacement on the right for right hip fracture of the femoral neck and this 89 year old female who has dementia but was previously ambulatory Arther Abbott 10/08/2022, 8:06 PM

## 2022-10-08 NOTE — Assessment & Plan Note (Signed)
Not on medication. - Hgab1c - Daily fasting CBG

## 2022-10-08 NOTE — ED Triage Notes (Signed)
Pt bib ems from home with reports of falling onto floor with complaints of R inner thigh pain. Pt with hx dementia. VSS with ems. Unknown if on anticoags. Per EMS, no obvious signs of trauma.

## 2022-10-08 NOTE — ED Provider Notes (Signed)
[facility] Provider Note   CSN: [id number] Arrival date & time: 10/08/22  1547     History  Chief Complaint  Patient presents with   Tamara [name] is a 89 y.o. female.  Level 5 caveat secondary to dementia.  Patient brought in by EMS from home after fall unwitnessed.  Patient does not recall the event.  She states she lives with her daughter who is on her way up.  Complaining of pain in her groin bilaterally.  She denies loss of consciousness and denies head and neck pain back pain chest pain abdominal pain.  The history is provided by the patient and the EMS personnel.  Fall This is a new problem. The problem has not changed since onset.Pertinent negatives include no chest pain, no abdominal pain, no headaches and no shortness of breath. Associated symptoms comments: Groin pain bilateral. The symptoms are aggravated by bending and twisting. She has tried nothing for the symptoms. The treatment provided no relief.       Home Medications Prior to Admission medications   Medication Sig Start Date End Date Taking? Authorizing Provider  amLODipine (NORVASC) 5 MG tablet Take 5 mg by mouth daily.    [provider]  calcium-vitamin D (OSCAL WITH D) 500-200 MG-UNIT per tablet Take 2 tablets by mouth daily with breakfast. 04/25/13   Baird Cancer, PA-C  donepezil (ARICEPT) 10 MG tablet Take 10 mg by mouth daily. 06/09/17   [provider]  losartan (COZAAR) 25 MG tablet Take 25 mg by mouth daily. 12/09/16   [provider]  meclizine (ANTIVERT) 50 MG tablet Take 1 tablet (50 mg total) by mouth 3 (three) times daily as needed. 05/16/22   Tretha Sciara, MD  NEEDLE, DISP, 27 G (BD DISP NEEDLES) 27G X 1/2" MISC 1 Syringe by Does not apply route every 30 (thirty) days. 11/28/19   Glennie Isle, NP-C      Allergies    Patient has no known allergies.    Review of Systems   Review of Systems  Unable to  perform ROS: Dementia  Respiratory:  Negative for shortness of breath.   Cardiovascular:  Negative for chest pain.  Gastrointestinal:  Negative for abdominal pain.  Neurological:  Negative for headaches.    Physical Exam Updated Vital Signs BP (!) 120/56 (BP Location: Right Arm)   Pulse 83   Temp 99.1 F (37.3 C)   Resp 20   Wt 64.4 kg   SpO2 93%   BMI 25.97 kg/m  Physical Exam Vitals and nursing note reviewed.  Constitutional:      General: She is not in acute distress.    Appearance: Normal appearance. She is well-developed.  HENT:     Head: Normocephalic and atraumatic.  Eyes:     Conjunctiva/sclera: Conjunctivae normal.  Cardiovascular:     Rate and Rhythm: Normal rate and regular rhythm.     Heart sounds: No murmur heard. Pulmonary:     Effort: Pulmonary effort is normal. No respiratory distress.     Breath sounds: Normal breath sounds.  Abdominal:     Palpations: Abdomen is soft.     Tenderness: There is no abdominal tenderness.  Musculoskeletal:        General: Tenderness and deformity present.     Cervical back: Neck supple.     Comments: Right lower extremity is externally rotated and shortened a little bit.  She is diffusely tender  about her proximal thigh and pelvis on the right.  Some mild tenderness of her right knee.  Left lower extremity full range of motion.  Bilateral upper extremities full range of motion.  No cervical thoracic or lumbar tenderness.  Skin:    General: Skin is warm and dry.     Capillary Refill: Capillary refill takes less than 2 seconds.  Neurological:     General: No focal deficit present.     Mental Status: She is alert. She is disoriented.     Sensory: No sensory deficit.     Motor: No weakness.     ED Results / Procedures / Treatments   Labs (all labs ordered are listed, but only abnormal results are displayed) Labs Reviewed  BASIC METABOLIC PANEL - Abnormal; Notable for the following components:      Result Value    Glucose, Bld 120 (*)    Calcium 8.6 (*)    GFR, Estimated 60 (*)    All other components within normal limits  CBC WITH DIFFERENTIAL/PLATELET - Abnormal; Notable for the following components:   WBC 12.0 (*)    RBC 3.86 (*)    Neutro Abs 10.3 (*)    Lymphs Abs 0.6 (*)    All other components within normal limits  BASIC METABOLIC PANEL - Abnormal; Notable for the following components:   Glucose, Bld 128 (*)    Calcium 8.5 (*)    All other components within normal limits  CBC - Abnormal; Notable for the following components:   WBC 11.7 (*)    All other components within normal limits  PROTIME-INR  HEMOGLOBIN A1C  TYPE AND SCREEN    EKG EKG Interpretation  Date/Time:  Thursday October 08 2022 16:03:59 EDT Ventricular Rate:  79 PR Interval:  200 QRS Duration: 87 QT Interval:  393 QTC Calculation: 451 R Axis:   7 Text Interpretation: Sinus rhythm Low voltage, precordial leads No significant change since prior 10/23 Confirmed by Aletta Edouard [phone]) on 10/08/2022 4:12:02 PM  Radiology CT Head Wo Contrast  Result Date: 10/08/2022 CLINICAL DATA:  [place] onto floor, dimension EXAM: CT HEAD WITHOUT CONTRAST TECHNIQUE: Contiguous axial images were obtained from the base of the skull through the vertex without intravenous contrast. RADIATION DOSE REDUCTION: This exam was performed according to the departmental dose-optimization program which includes automated exposure control, adjustment of the mA and/or kV according to patient size and/or use of iterative reconstruction technique. COMPARISON:  05/16/2022 FINDINGS: Brain: No acute infarct or hemorrhage. Stable chronic small-vessel ischemic changes throughout the periventricular white matter. Lateral ventricles and midline structures are unremarkable. No acute extra-axial fluid collections. No mass effect. Vascular: No hyperdense vessel or unexpected calcification. Skull: Normal. Negative for fracture or focal lesion. Sinuses/Orbits: Chronic  sphenoid sinus mucosal thickening. No gas fluid levels. Other: None. IMPRESSION: 1. Stable head CT, no acute intracranial process. Electronically Signed   By: Randa Ngo M.D.   On: 10/08/2022 18:17   DG Knee 2 Views Right  Result Date: 10/08/2022 CLINICAL DATA:  fall pain EXAM: RIGHT KNEE - 2 VIEW COMPARISON:  None Available. FINDINGS: Osseous structures are osteopenic. No evidence of fracture, dislocation, or joint effusion. There is tricompartmental joint space narrowing, sclerosis and osteophytes consistent with degenerative joint disease. No evidence of effusion. IMPRESSION: Degenerative changes. Osteopenia. Electronically Signed   By: Sammie Bench M.D.   On: 10/08/2022 17:45   DG Hip Unilat With Pelvis 2-3 Views Right  Result Date: 10/08/2022 CLINICAL DATA:  Fall, pain EXAM:  DG HIP (WITH OR WITHOUT PELVIS) 2-3V RIGHT COMPARISON:  None Available. FINDINGS: Right femoral neck subcapital fracture with varus angulation. Osseous structures are osteopenic. Pelvic ring appears intact PAC. Lumbosacral degenerative changes. IMPRESSION: Right femoral neck fracture. Electronically Signed   By: Sammie Bench M.D.   On: 10/08/2022 17:44   DG Chest 1 View  Result Date: 10/08/2022 CLINICAL DATA:  Fall, pain EXAM: CHEST  1 VIEW COMPARISON:  07/21/2018 FINDINGS: The heart size and mediastinal contours are within normal limits. Both lungs are clear. Aorta is calcified. Osseous structures are not well evaluated. IMPRESSION: No active disease. Electronically Signed   By: Sammie Bench M.D.   On: 10/08/2022 17:42    Procedures Procedures    Medications Ordered in ED Medications  0.9 %  sodium chloride infusion ( Intravenous New Bag/Given 10/08/22 2157)  acetaminophen (TYLENOL) tablet 650 mg ( Oral MAR Hold 10/09/22 1225)    Or  acetaminophen (TYLENOL) suppository 650 mg ( Rectal MAR Hold 10/09/22 1225)  ondansetron (ZOFRAN) tablet 4 mg ( Oral MAR Hold 10/09/22 1225)    Or  ondansetron (ZOFRAN)  injection 4 mg ( Intravenous MAR Hold 10/09/22 1225)  polyethylene glycol (MIRALAX / GLYCOLAX) packet 17 g ( Oral MAR Hold 10/09/22 1225)  morphine (PF) 2 MG/ML injection 1 mg ( Intravenous MAR Hold 10/09/22 1225)  traZODone (DESYREL) tablet 50 mg ( Oral MAR Hold 10/09/22 1225)  chlorhexidine (HIBICLENS) 4 % liquid 4 Application (has no administration in time range)  0.9 %  sodium chloride infusion ( Intravenous Restarted 10/09/22 1144)  ceFAZolin (ANCEF) IVPB 2g/100 mL premix (has no administration in time range)  tranexamic acid (CYKLOKAPRON) IVPB 1,000 mg (has no administration in time range)  lactated ringers infusion ( Intravenous Rate/Dose Change 10/09/22 1253)  tranexamic acid (CYKLOKAPRON) 1000MG /124mL IVPB (has no administration in time range)  ceFAZolin (ANCEF) 2-4 GM/100ML-% IVPB (has no administration in time range)  fentaNYL (SUBLIMAZE) injection 50 mcg (50 mcg Intravenous Given 10/08/22 1755)  heparin injection 5,000 Units (5,000 Units Subcutaneous Given 10/08/22 2146)  povidone-iodine 10 % swab 2 Application (2 Applications Topical Given 10/09/22 1246)  chlorhexidine (PERIDEX) 0.12 % solution 15 mL (15 mLs Mouth/Throat Given 10/09/22 1245)    Or  Oral care mouth rinse ( Mouth Rinse See Alternative 10/09/22 1245)    ED Course/ Medical Decision Making/ A&P Clinical Course as of 10/09/22 1416  Thu Oct 08, 2022  1750 X-rays interpreted by me as right femoral neck fracture.  Awaiting radiology reading. [MB]  [id number] I updated patient and his daughter on status of imaging and her need for admission.  Discussed with Dr. Aline Brochure orthopedics.  He is not sure if he is going to be able to operate on her tomorrow due to staffing but asked to keep her n.p.o. after midnight just in case.  Hospitalist has been paged. [MB]  [id number] Reviewed case with Dr. Denton Brick [facility] hospitalist will evaluate patient for admission. [MB]    Clinical Course User Index [MB] Hayden Rasmussen, MD                              Medical Decision Making Amount and/or Complexity of Data Reviewed Labs: ordered. Radiology: ordered.  Risk Prescription drug management. Decision regarding hospitalization.   This patient complains of fall hip and groin pain; this involves an extensive number of treatment Options and is a complaint that carries with it a high risk of complications and morbidity.  The differential includes fracture, contusion, bleed, syncope, arrhythmia, metabolic derangement  I ordered, reviewed and interpreted labs, which included CBC with mildly elevated white count stable hemoglobin, chemistries fairly normal, coags normal I ordered medication IV pain medication and reviewed PMP when indicated. I ordered imaging studies which included CT head, x-rays of pelvis right hip right knee and chest and I independently    visualized and interpreted imaging which showed acute right femoral neck fracture Additional history obtained from EMS and patient's daughter Previous records obtained and reviewed in epic no recent admissions I consulted orthopedics Dr. Aline Brochure and [facility] hospitalist Dr. Denton Brick I and discussed lab and imaging findings and discussed disposition.  Cardiac monitoring reviewed, normal sinus rhythm Social determinants considered, dementia no other significant barriers Critical Interventions: None  After the interventions stated above, I reevaluated the patient and found patient's pain to be adequately controlled Admission and further testing considered, she would benefit from mission to the hospital for orthopedic evaluation and likely operative repair.  Daughter is in agreement with plan for admission         Final Clinical Impression(s) / ED Diagnoses Final diagnoses:  Fall, initial encounter  Closed displaced fracture of right femoral neck ([place])    Rx / DC Orders ED Discharge Orders     None         Hayden Rasmussen, MD 10/09/22 1420

## 2022-10-09 ENCOUNTER — Inpatient Hospital Stay (HOSPITAL_COMMUNITY): Payer: PPO | Admitting: Certified Registered Nurse Anesthetist

## 2022-10-09 ENCOUNTER — Encounter (HOSPITAL_COMMUNITY): Payer: Self-pay | Admitting: Internal Medicine

## 2022-10-09 ENCOUNTER — Inpatient Hospital Stay (HOSPITAL_COMMUNITY): Payer: PPO

## 2022-10-09 ENCOUNTER — Encounter (HOSPITAL_COMMUNITY)
Admission: EM | Disposition: A | Discharge status: Skilled Nursing Facility | Payer: Self-pay | Source: Home / Self Care | Attending: Internal Medicine

## 2022-10-09 DIAGNOSIS — S72001A Fracture of unspecified part of neck of right femur, initial encounter for closed fracture: Secondary | ICD-10-CM | POA: Diagnosis not present

## 2022-10-09 DIAGNOSIS — Z7984 Long term (current) use of oral hypoglycemic drugs: Secondary | ICD-10-CM

## 2022-10-09 DIAGNOSIS — I10 Essential (primary) hypertension: Secondary | ICD-10-CM

## 2022-10-09 DIAGNOSIS — F0390 Unspecified dementia without behavioral disturbance: Secondary | ICD-10-CM | POA: Diagnosis not present

## 2022-10-09 DIAGNOSIS — E119 Type 2 diabetes mellitus without complications: Secondary | ICD-10-CM

## 2022-10-09 DIAGNOSIS — Z87891 Personal history of nicotine dependence: Secondary | ICD-10-CM

## 2022-10-09 HISTORY — PX: HIP ARTHROPLASTY: SHX981

## 2022-10-09 LAB — BASIC METABOLIC PANEL
Anion gap: 11 (ref 5–15)
BUN: 20 mg/dL (ref 8–23)
CO2: 23 mmol/L (ref 22–32)
Calcium: 8.5 mg/dL — ABNORMAL LOW (ref 8.9–10.3)
Chloride: 102 mmol/L (ref 98–111)
Creatinine, Ser: 0.89 mg/dL (ref 0.44–1.00)
GFR, Estimated: >60 mL/min (ref >60)
Glucose, Bld: 128 mg/dL — ABNORMAL HIGH (ref 70–99)
Potassium: 3.5 mmol/L (ref 3.5–5.1)
Sodium: 136 mmol/L (ref 135–145)

## 2022-10-09 LAB — CBC
HCT: 38.6 % (ref 36.0–46.0)
Hemoglobin: 12.9 g/dL (ref 12.0–15.0)
MCH: 31.8 pg (ref 26.0–34.0)
MCHC: 33.4 g/dL (ref 30.0–36.0)
MCV: 95.1 fL (ref 80.0–100.0)
Platelets: 197 10*3/uL (ref 150–400)
RBC: 4.06 MIL/uL (ref 3.87–5.11)
RDW: 13.7 % (ref 11.5–15.5)
WBC: 11.7 10*3/uL — ABNORMAL HIGH (ref 4.0–10.5)
nRBC: 0 % (ref 0.0–0.2)

## 2022-10-09 SURGERY — ARTHROPLASTY BIPOLAR HIP (HEMIARTHROPLASTY)
Anesthesia: Spinal | Site: Hip | Laterality: Right

## 2022-10-09 MED ORDER — PROPOFOL 500 MG/50ML IV EMUL
INTRAVENOUS | Status: AC
  Filled 2022-10-09: qty 100

## 2022-10-09 MED ORDER — PHENYLEPHRINE 80 MCG/ML (10ML) SYRINGE FOR IV PUSH (FOR BLOOD PRESSURE SUPPORT)
PREFILLED_SYRINGE | INTRAVENOUS | Status: AC
  Filled 2022-10-09: qty 10

## 2022-10-09 MED ORDER — SODIUM CHLORIDE 0.9 % IV SOLN: INTRAVENOUS | Status: AC

## 2022-10-09 MED ORDER — PROPOFOL 500 MG/50ML IV EMUL
INTRAVENOUS | Status: DC | PRN
  Administered 2022-10-09: 50 ug/kg/min via INTRAVENOUS
  Administered 2022-10-09: 30 mg via INTRAVENOUS

## 2022-10-09 MED ORDER — CEFAZOLIN SODIUM-DEXTROSE 2-4 GM/100ML-% IV SOLN
2.0000 g | INTRAVENOUS | Status: AC
  Administered 2022-10-09: 2 g via INTRAVENOUS

## 2022-10-09 MED ORDER — POVIDONE-IODINE 10 % EX SWAB
2.0000 | Freq: Once | CUTANEOUS | Status: AC
  Administered 2022-10-09: 2 via TOPICAL

## 2022-10-09 MED ORDER — HYDROMORPHONE HCL 1 MG/ML IJ SOLN: 0.2500 mg | INTRAMUSCULAR | Status: DC | PRN

## 2022-10-09 MED ORDER — ORAL CARE MOUTH RINSE: 15.0000 mL | Freq: Once | OROMUCOSAL | Status: AC

## 2022-10-09 MED ORDER — CHLORHEXIDINE GLUCONATE 0.12 % MT SOLN
OROMUCOSAL | Status: AC
  Administered 2022-10-09: 15 mL via OROMUCOSAL
  Filled 2022-10-09: qty 15

## 2022-10-09 MED ORDER — 0.9 % SODIUM CHLORIDE (POUR BTL) OPTIME
TOPICAL | Status: DC | PRN
  Administered 2022-10-09: 1000 mL

## 2022-10-09 MED ORDER — PROPOFOL 10 MG/ML IV BOLUS
INTRAVENOUS | Status: AC
  Filled 2022-10-09: qty 20

## 2022-10-09 MED ORDER — SODIUM CHLORIDE 0.9 % IV SOLN: INTRAVENOUS | Status: DC

## 2022-10-09 MED ORDER — SEVOFLURANE IN SOLN
RESPIRATORY_TRACT | Status: AC
  Filled 2022-10-09: qty 250

## 2022-10-09 MED ORDER — TRANEXAMIC ACID-NACL 1000-0.7 MG/100ML-% IV SOLN
1000.0000 mg | INTRAVENOUS | Status: AC
  Administered 2022-10-09: 1000 mg via INTRAVENOUS

## 2022-10-09 MED ORDER — BUPIVACAINE-EPINEPHRINE (PF) 0.5% -1:200000 IJ SOLN
INTRAMUSCULAR | Status: DC | PRN
  Administered 2022-10-09: 30 mL via PERINEURAL

## 2022-10-09 MED ORDER — BUPIVACAINE-EPINEPHRINE (PF) 0.5% -1:200000 IJ SOLN
INTRAMUSCULAR | Status: AC
  Filled 2022-10-09: qty 30

## 2022-10-09 MED ORDER — KETAMINE HCL 50 MG/5ML IJ SOSY
PREFILLED_SYRINGE | INTRAMUSCULAR | Status: AC
  Filled 2022-10-09: qty 5

## 2022-10-09 MED ORDER — CHLORHEXIDINE GLUCONATE 0.12 % MT SOLN: 15.0000 mL | Freq: Once | OROMUCOSAL | Status: AC

## 2022-10-09 MED ORDER — DEXMEDETOMIDINE HCL IN NACL 80 MCG/20ML IV SOLN
INTRAVENOUS | Status: DC | PRN
  Administered 2022-10-09: 10 ug via BUCCAL

## 2022-10-09 MED ORDER — STERILE WATER FOR IRRIGATION IR SOLN
Status: DC | PRN
  Administered 2022-10-09: 1000 mL

## 2022-10-09 MED ORDER — TRANEXAMIC ACID-NACL 1000-0.7 MG/100ML-% IV SOLN
INTRAVENOUS | Status: AC
  Filled 2022-10-09: qty 100

## 2022-10-09 MED ORDER — DEXMEDETOMIDINE HCL IN NACL 80 MCG/20ML IV SOLN
INTRAVENOUS | Status: AC
  Filled 2022-10-09: qty 20

## 2022-10-09 MED ORDER — ONDANSETRON HCL 4 MG/2ML IJ SOLN
INTRAMUSCULAR | Status: AC
  Filled 2022-10-09: qty 2

## 2022-10-09 MED ORDER — LACTATED RINGERS IV SOLN: INTRAVENOUS | Status: DC

## 2022-10-09 MED ORDER — PHENYLEPHRINE HCL (PRESSORS) 10 MG/ML IV SOLN
INTRAVENOUS | Status: AC
  Filled 2022-10-09: qty 1

## 2022-10-09 MED ORDER — PHENYLEPHRINE HCL-NACL 20-0.9 MG/250ML-% IV SOLN
INTRAVENOUS | Status: DC | PRN
  Administered 2022-10-09: 30 ug/min via INTRAVENOUS

## 2022-10-09 MED ORDER — EPHEDRINE 5 MG/ML INJ
INTRAVENOUS | Status: AC
  Filled 2022-10-09: qty 5

## 2022-10-09 MED ORDER — KETOROLAC TROMETHAMINE 15 MG/ML IJ SOLN
15.0000 mg | Freq: Once | INTRAMUSCULAR | Status: AC
  Administered 2022-10-09: 15 mg via INTRAVENOUS
  Filled 2022-10-09: qty 1

## 2022-10-09 MED ORDER — SODIUM CHLORIDE 0.9 % IR SOLN
Status: DC | PRN
  Administered 2022-10-09: 3000 mL

## 2022-10-09 MED ORDER — CHLORHEXIDINE GLUCONATE 4 % EX LIQD
60.0000 mL | Freq: Once | CUTANEOUS | Status: DC
  Administered 2022-10-09: 4 via TOPICAL

## 2022-10-09 MED ORDER — SODIUM CHLORIDE 0.9 % IV BOLUS
500.0000 mL | Freq: Once | INTRAVENOUS | Status: AC
  Administered 2022-10-09: 500 mL via INTRAVENOUS

## 2022-10-09 MED ORDER — CEFAZOLIN SODIUM-DEXTROSE 2-4 GM/100ML-% IV SOLN
INTRAVENOUS | Status: AC
  Filled 2022-10-09: qty 100

## 2022-10-09 SURGICAL SUPPLY — 52 items
APL PRP STRL LF DISP 70% ISPRP (MISCELLANEOUS) ×2
APL SKNCLS STERI-STRIP NONHPOA (GAUZE/BANDAGES/DRESSINGS) ×1
BENZOIN TINCTURE PRP APPL 2/3 (GAUZE/BANDAGES/DRESSINGS) ×1
BIPOLAR PROS AML 45 (Hips) ×1 IMPLANT
BIT DRILL 2.8X128 (BIT) ×1
BLADE SAGITTAL 25.0X1.27X90 (BLADE) ×1
CHLORAPREP W/TINT 26 (MISCELLANEOUS) ×2
CLOTH BEACON ORANGE TIMEOUT ST (SAFETY) ×1
COVER LIGHT HANDLE STERIS (MISCELLANEOUS) ×2
DECANTER SPIKE VIAL GLASS SM (MISCELLANEOUS) ×1
DRAPE HIP W/POCKET STRL (MISCELLANEOUS) ×1
DRAPE U-SHAPE 47X51 STRL (DRAPES) ×1
DRSG MEPILEX POST OP 4X12 (GAUZE/BANDAGES/DRESSINGS) ×1
DRSG MEPILEX SACRM 8.7X9.8 (GAUZE/BANDAGES/DRESSINGS) ×1
ELECT REM PT RETURN 9FT ADLT (ELECTROSURGICAL) ×1
GLOVE BIO SURGEON STRL SZ7 (GLOVE) ×1
GLOVE BIOGEL PI IND STRL 7.0 (GLOVE) ×3
GLOVE BIOGEL PI IND STRL 7.5 (GLOVE) ×2
GLOVE SS N UNI LF 8.5 STRL (GLOVE) ×1
GLOVE SURG POLYISO LF SZ8 (GLOVE) ×2
GLOVE SURG SS PI 7.0 STRL IVOR (GLOVE) ×1
GOWN STRL REUS W/TWL LRG LVL3 (GOWN DISPOSABLE) ×3
GOWN STRL REUS W/TWL XL LVL3 (GOWN DISPOSABLE) ×1
HANDPIECE INTERPULSE COAX TIP (DISPOSABLE) ×1
HEAD FEM STD 28X+1.5 STRL (Hips) ×1 IMPLANT
INST SET MAJOR BONE (KITS) ×1
KIT BLADEGUARD II DBL (SET/KITS/TRAYS/PACK) ×1
KIT TURNOVER KIT A (KITS) ×1
MANIFOLD NEPTUNE II (INSTRUMENTS) ×1
MARKER SKIN DUAL TIP RULER LAB (MISCELLANEOUS) ×1
NDL HYPO 21X1.5 SAFETY (NEEDLE) ×1 IMPLANT
NEEDLE HYPO 21X1.5 SAFETY (NEEDLE) ×1
NS IRRIG 1000ML POUR BTL (IV SOLUTION) ×1
PACK TOTAL JOINT (CUSTOM PROCEDURE TRAY) ×1
PAD ARMBOARD 7.5X6 YLW CONV (MISCELLANEOUS) ×1
PASSER SUT SWANSON 36MM LOOP (INSTRUMENTS) ×1
PIN STMN SNGL STERILE 9X3.6MM (PIN) ×2
SET BASIN LINEN APH (SET/KITS/TRAYS/PACK) ×1
SET HNDPC FAN SPRY TIP SCT (DISPOSABLE) ×1
STAPLER VISISTAT 35W (STAPLE) ×1
STEM SUMMIT BASIC PRESSFIT SZ5 (Hips) ×1 IMPLANT
STRIP CLOSURE SKIN 1/2X4 (GAUZE/BANDAGES/DRESSINGS) ×2
SUT BRALON NAB BRD #1 30IN (SUTURE) ×2
SUT ETHIBOND 5 LR DA (SUTURE) ×1
SUT MNCRL 0 VIOLET CTX 36 (SUTURE) ×1
SUT MON AB 2-0 CT1 36 (SUTURE) ×1
SUT MONOCRYL 0 CTX 36 (SUTURE) ×1
SUT VIC AB 1 CT1 27 (SUTURE) ×6
SUT VIC AB 1 CT1 27XBRD ANTBC (SUTURE) ×6
SYR BULB IRRIG 60ML STRL (SYRINGE) ×1
WATER STERILE IRR 1000ML POUR (IV SOLUTION) ×1
YANKAUER SUCT 12FT TUBE ARGYLE (SUCTIONS) ×1

## 2022-10-09 NOTE — Progress Notes (Signed)
   10/09/22 2002  Assess: MEWS Score  Temp (!) 102.8 F (39.3 C)  BP (!) 150/122  MAP (mmHg) 133  Pulse Rate (!) 109  Resp 18  SpO2 92 %  O2 Device Room Air  Assess: MEWS Score  MEWS Temp 2  MEWS Systolic 0  MEWS Pulse 1  MEWS RR 0  MEWS LOC 0  MEWS Score 3  MEWS Score Color Yellow  Assess: if the MEWS score is Yellow or Red  Were vital signs taken at a resting state? Yes  Focused Assessment Change from prior assessment (see assessment flowsheet)  Does the patient meet 2 or more of the SIRS criteria? No  Does the patient have a confirmed or suspected source of infection? No  Provider and Rapid Response Notified? No  MEWS guidelines implemented  Yes, yellow  Treat  MEWS Interventions Considered administering scheduled or prn medications/treatments as ordered  Take Vital Signs  Increase Vital Sign Frequency  Yellow: Q2hr x1, continue Q4hrs until patient remains green for 12hrs  Escalate  MEWS: Escalate Yellow: Discuss with charge nurse and consider notifying provider and/or RRT  Notify: Charge Nurse/RN  Name of Charge Nurse/RN Notified LeTina, RN  Provider Notification  Provider Name/Title Josephine Cables, MD  Date Provider Notified 10/09/22  Time Provider Notified 2010  Method of Notification  (secure chat)  Notification Reason Change in status  Provider response See new orders  Date of Provider Response 10/09/22  Time of Provider Response 2015  Assess: SIRS CRITERIA  SIRS Temperature  1  SIRS Pulse 1  SIRS Respirations  0  SIRS WBC 0  SIRS Score Sum  2

## 2022-10-09 NOTE — Progress Notes (Signed)
   10/09/22 2143  Assess: MEWS Score  Temp (!) 102.7 F (39.3 C)  BP (!) 91/54  MAP (mmHg) 65  Pulse Rate (!) 103  Resp 20  SpO2 93 %  O2 Device Room Air  Assess: MEWS Score  MEWS Temp 2  MEWS Systolic 1  MEWS Pulse 1  MEWS RR 0  MEWS LOC 0  MEWS Score 4  MEWS Score Color Red  Assess: if the MEWS score is Yellow or Red  Were vital signs taken at a resting state? Yes  Focused Assessment Change from prior assessment (see assessment flowsheet)  Does the patient meet 2 or more of the SIRS criteria? No  Does the patient have a confirmed or suspected source of infection? No  Provider and Rapid Response Notified? No  MEWS guidelines implemented  Yes, red  Treat  MEWS Interventions Considered administering scheduled or prn medications/treatments as ordered  Take Vital Signs  Increase Vital Sign Frequency  Red: Q1hr x2, continue Q4hrs until patient remains green for 12hrs  Escalate  MEWS: Escalate Red: Discuss with charge nurse and notify provider. Consider notifying RRT. If remains red for 2 hours consider need for higher level of care  Notify: Charge Nurse/RN  Name of Charge Nurse/RN Notified Donney Rankins, RN  Provider Notification  Provider Name/Title Josephine Cables, MD  Date Provider Notified 10/09/22  Time Provider Notified 2145  Method of Notification  (secure chat)  Notification Reason Change in status  Provider response See new orders  Date of Provider Response 10/09/22  Time of Provider Response 2150  Assess: SIRS CRITERIA  SIRS Temperature  1  SIRS Pulse 1  SIRS Respirations  0  SIRS WBC 0  SIRS Score Sum  2

## 2022-10-09 NOTE — Progress Notes (Signed)
PROGRESS NOTE  [name] [id number] DOB: [birth year] DOA: 10/08/2022 PCP: [name], MD  Brief History:  89 year old female with a history of dementia, diabetes mellitus, hypertension, right breast cancer, and B12 deficiency presenting after an unwitnessed fall with right leg pain.  Apparently, the patient was sleeping some cereal that showed a spilled near the kitchen when she sustained a fall.  She subsequently crawled and made her way to a cellular phone and contacted her daughter.  Her daughter came home from work and found the patient laying on her right side awake and alert.  It was felt that patient likely tripped when she was sweeping up some cereal.  She did not hit her head.  Unfortunately, the patient is able to provide history secondary to her dementia.  EMS was activated and the patient was brought to emergency department. In the ED, the patient had low-grade temperature of 99.6.  She was hemodynamically stable with oxygen saturation 95% room air.  WBC 12.0, hemoglobin 12.2, platelets 197,000.  Sodium 136, potassium 3.5, bicarbonate 23, serum creatinine 0.89.  EKG shows sinus rhythm without concerning ST ST wave changes.  X-ray of the right pelvis showed a right femoral neck fracture.  Orthopedics, Dr. Aline Brochure was consulted.   Assessment/Plan: Right femoral neck fracture -At baseline, patient ambulates without assistance -Orthopedics, Dr. Aline Brochure consulted -Remain n.p.o. -Judicious opioids  Dementia without behavioral disturbance -Continue Aricept  Essential hypertension -Holding amlodipine and losartan temporarily -BP well-controlled presently  Diabetes mellitus type 2, controlled -Not on any medications as an outpatient -Check hemoglobin A1c  Right breast cancer -Currently in remission -Status post lumpectomy and lymph node dissection August 2014 -Patient declined radiation therapy -s/p Aromasin October 2014, continued till October 2019.  -Follow-up  Dr. Delton Coombes       Family Communication:  daughter updated 3/22  Consultants:  ortho--[place]  Code Status:  FULL   DVT Prophylaxis:  [place] Heparin on hold for surgery   Procedures: As Listed in Progress Note Above  Antibiotics: None        Subjective: Patient denies fevers, chills, headache, chest pain, dyspnea, nausea, vomiting, diarrhea, abdominal pain, dysuria, hematuria, hematochezia, and melena.   Objective: Vitals:   10/08/22 2016 10/08/22 2022 10/09/22 0037 10/09/22 0503  BP: (!) 115/92  128/73 132/71  Pulse: 93  97 94  Resp: 20  20 20   Temp: 98.5 F (36.9 C)  99.6 F (37.6 C) 98.4 F (36.9 C)  TempSrc:   Oral Oral  SpO2: 94% 94% 96% 95%  Weight:       No intake or output data in the 24 hours ending 10/09/22 0736 Weight change:  Exam:  General:  Pt is alert, follows commands appropriately, not in acute distress HEENT: No icterus, No thrush, No neck mass, [place]/AT Cardiovascular: RRR, S1/S2, no rubs, no gallops Respiratory: bibasilar crackles. No wheeze Abdomen: Soft/+BS, non tender, non distended, no guarding Extremities: No edema, No lymphangitis, No petechiae, No rashes, no synovitis   Data Reviewed: I have personally reviewed following labs and imaging studies Basic Metabolic Panel: Recent Labs  Lab 10/08/22 1612 10/09/22 0451  NA 137 136  K 4.3 3.5  CL 104 102  CO2 23 23  GLUCOSE 120* 128*  BUN 23 20  CREATININE 0.93 0.89  CALCIUM 8.6* 8.5*   Liver Function Tests: No results for input(s): "AST", "ALT", "ALKPHOS", "BILITOT", "PROT", "ALBUMIN" in the last 168 hours. No results for input(s): "LIPASE", "AMYLASE" in  the last 168 hours. No results for input(s): "AMMONIA" in the last 168 hours. Coagulation Profile: Recent Labs  Lab 10/08/22 1612  INR 1.1   CBC: Recent Labs  Lab 10/08/22 1612 10/09/22 0451  WBC 12.0* 11.7*  NEUTROABS 10.3*  --   HGB 12.2 12.9  HCT 36.6 38.6  MCV 94.8 95.1  PLT 195 197   Cardiac  Enzymes: No results for input(s): "CKTOTAL", "CKMB", "CKMBINDEX", "TROPONINI" in the last 168 hours. BNP: Invalid input(s): "POCBNP" CBG: No results for input(s): "GLUCAP" in the last 168 hours. HbA1C: No results for input(s): "HGBA1C" in the last 72 hours. Urine analysis: No results found for: "COLORURINE", "APPEARANCEUR", "LABSPEC", "PHURINE", "GLUCOSEU", "HGBUR", "BILIRUBINUR", "KETONESUR", "PROTEINUR", "UROBILINOGEN", "NITRITE", "LEUKOCYTESUR" Sepsis Labs: @LABRCNTIP (procalcitonin:4,lacticidven:4) )No results found for this or any previous visit (from the past 240 hour(s)).   Scheduled Meds: Continuous Infusions:  sodium chloride 75 mL/hr at 10/08/22 2157    Procedures/Studies: CT Head Wo Contrast  Result Date: 10/08/2022 CLINICAL DATA:  [place] onto floor, dimension EXAM: CT HEAD WITHOUT CONTRAST TECHNIQUE: Contiguous axial images were obtained from the base of the skull through the vertex without intravenous contrast. RADIATION DOSE REDUCTION: This exam was performed according to the departmental dose-optimization program which includes automated exposure control, adjustment of the mA and/or kV according to patient size and/or use of iterative reconstruction technique. COMPARISON:  05/16/2022 FINDINGS: Brain: No acute infarct or hemorrhage. Stable chronic small-vessel ischemic changes throughout the periventricular white matter. Lateral ventricles and midline structures are unremarkable. No acute extra-axial fluid collections. No mass effect. Vascular: No hyperdense vessel or unexpected calcification. Skull: Normal. Negative for fracture or focal lesion. Sinuses/Orbits: Chronic sphenoid sinus mucosal thickening. No gas fluid levels. Other: None. IMPRESSION: 1. Stable head CT, no acute intracranial process. Electronically Signed   By: Randa Ngo M.D.   On: 10/08/2022 18:17   DG Knee 2 Views Right  Result Date: 10/08/2022 CLINICAL DATA:  fall pain EXAM: RIGHT KNEE - 2 VIEW COMPARISON:   None Available. FINDINGS: Osseous structures are osteopenic. No evidence of fracture, dislocation, or joint effusion. There is tricompartmental joint space narrowing, sclerosis and osteophytes consistent with degenerative joint disease. No evidence of effusion. IMPRESSION: Degenerative changes. Osteopenia. Electronically Signed   By: Sammie Bench M.D.   On: 10/08/2022 17:45   DG Hip Unilat With Pelvis 2-3 Views Right  Result Date: 10/08/2022 CLINICAL DATA:  Fall, pain EXAM: DG HIP (WITH OR WITHOUT PELVIS) 2-3V RIGHT COMPARISON:  None Available. FINDINGS: Right femoral neck subcapital fracture with varus angulation. Osseous structures are osteopenic. Pelvic ring appears intact PAC. Lumbosacral degenerative changes. IMPRESSION: Right femoral neck fracture. Electronically Signed   By: Sammie Bench M.D.   On: 10/08/2022 17:44   DG Chest 1 View  Result Date: 10/08/2022 CLINICAL DATA:  Fall, pain EXAM: CHEST  1 VIEW COMPARISON:  07/21/2018 FINDINGS: The heart size and mediastinal contours are within normal limits. Both lungs are clear. Aorta is calcified. Osseous structures are not well evaluated. IMPRESSION: No active disease. Electronically Signed   By: Sammie Bench M.D.   On: 10/08/2022 17:42    Orson Eva, DO  [facility]  If 7PM-7AM, please contact night-coverage [email or web address] Password [facility] 10/09/2022, 7:36 AM   LOS: 1 day

## 2022-10-09 NOTE — Hospital Course (Addendum)
89 year old female with a history of dementia, diabetes mellitus, hypertension, right breast cancer, and B12 deficiency presenting after an unwitnessed fall with right leg pain.  Apparently, the patient was sleeping some cereal that showed a spilled near the kitchen when she sustained a fall.  She subsequently crawled and made her way to a cellular phone and contacted her daughter.  Her daughter came home from work and found the patient laying on her right side awake and alert.  It was felt that patient likely tripped when she was sweeping up some cereal.  She did not hit her head.  Unfortunately, the patient is able to provide history secondary to her dementia.  EMS was activated and the patient was brought to emergency department. In the ED, the patient had low-grade temperature of 99.6.  She was hemodynamically stable with oxygen saturation 95% room air.  WBC 12.0, hemoglobin 12.2, platelets 197,000.  Sodium 136, potassium 3.5, bicarbonate 23, serum creatinine 0.89.  EKG shows sinus rhythm without concerning ST ST wave changes.  X-ray of the right pelvis showed a right femoral neck fracture.  Orthopedics, Dr. Aline Brochure was consulted.  At baseline, the patient ambulates independently without any assistance.  She was able to perform all her activities of daily living, but does need some assistance with bathing to prevent falls.  Prior to her injury, the patient had not complained of any new complaints.  There is no fevers, chills, chest pain, shortness breath, nausea, vomit, diarrhea, abdominal pain, hematochezia, melena Pt underwent Right hip bipolar arthroplasty on 10/09/22 by Dr. Aline Brochure.  She developed a postop fever up to 102.8 on the evening 102.8

## 2022-10-09 NOTE — Progress Notes (Signed)
Pt returned to room 314 via bed from PACU s/p right hip arthroplasty. DDI to right hip with no drainage noted. Ice pack intact to op-site. Pt with cap refill < 3 sec, feet/toes warm, pink and pt with good movement and sensation. Pt alert, oriented to self only, denies c/o at present. VSS. Saline lock intact to left forearm. Foley cath intact draining clear yellow urine. Daughter at bedside. Bed alarm on, call bell within reach. Advised pt and daughter to call for needs, both state understanding.

## 2022-10-09 NOTE — Anesthesia Preprocedure Evaluation (Signed)
Anesthesia Evaluation  Patient identified by MRN, date of birth, ID band Patient awake    Reviewed: Allergy & Precautions, H&P , NPO status , Patient's Chart, lab work & pertinent test results  Airway Mallampati: II  TM Distance: >3 FB Neck ROM: Full    Dental  (+) Lower Dentures, Upper Dentures   Pulmonary neg pulmonary ROS, former smoker   Pulmonary exam normal breath sounds clear to auscultation       Cardiovascular Exercise Tolerance: Good hypertension, Pt. on medications Normal cardiovascular exam+ Valvular Problems/Murmurs  Rhythm:Regular Rate:Normal     Neuro/Psych  PSYCHIATRIC DISORDERS Anxiety    Dementia negative neurological ROS     GI/Hepatic negative GI ROS, Neg liver ROS,,,  Endo/Other  diabetes, Well Controlled, Type 2, Oral Hypoglycemic Agents    Renal/GU negative Renal ROS  negative genitourinary   Musculoskeletal negative musculoskeletal ROS (+)    Abdominal   Peds negative pediatric ROS (+)  Hematology  (+) Blood dyscrasia, anemia   Anesthesia Other Findings Right breast cancer  Reproductive/Obstetrics negative OB ROS                              Anesthesia Physical Anesthesia Plan  ASA: 3  Anesthesia Plan: Spinal   Post-op Pain Management: Dilaudid IV   Induction: Intravenous  PONV Risk Score and Plan:   Airway Management Planned: Nasal Cannula, Natural Airway and Oral ETT  Additional Equipment:   Intra-op Plan:   Post-operative Plan: Extubation in OR  Informed Consent: I have reviewed the patients History and Physical, chart, labs and discussed the procedure including the risks, benefits and alternatives for the proposed anesthesia with the patient or authorized representative who has indicated his/her understanding and acceptance.     Dental advisory given  Plan Discussed with: CRNA and Surgeon  Anesthesia Plan Comments:           Anesthesia Quick Evaluation

## 2022-10-09 NOTE — Anesthesia Postprocedure Evaluation (Signed)
Anesthesia Post Note  Patient: Tamara [name]  Procedure(s) Performed: ARTHROPLASTY BIPOLAR HIP (HEMIARTHROPLASTY) (Right: Hip)  Patient location during evaluation: PACU Anesthesia Type: Spinal Level of consciousness: awake and alert Pain management: pain level controlled Vital Signs Assessment: post-procedure vital signs reviewed and stable Respiratory status: spontaneous breathing, nonlabored ventilation, respiratory function stable and patient connected to nasal cannula oxygen Cardiovascular status: blood pressure returned to baseline and stable Postop Assessment: no apparent nausea or vomiting Anesthetic complications: no  No notable events documented.   Last Vitals:  Vitals:   10/09/22 1645 10/09/22 1700  BP: (!) 108/57 (!) 118/50  Pulse: 91 94  Resp: (!) 21 20  Temp: 36.4 C 37.5 C  SpO2: 100% 94%    Last Pain:  Vitals:   10/09/22 1700  TempSrc: Oral  PainSc:                  Pedro Oldenburg C Samnang Shugars

## 2022-10-09 NOTE — TOC Initial Note (Signed)
Transition of Care [facility]) - Initial/Assessment Note    Patient Details  Name: Tamara [name] MRN: [id number] Date of Birth: 05-22-1934  [facility] CM/SW Contact:    Iona Beard, [place] Phone Number: 10/09/2022, 9:48 AM  Clinical Narrative:                 CSW spoke with pts daughter who states she lives with pt. Pt is mostly independent in completing ADLs. Pts daughter or son are able to provide transportation to appointments when needed. Pt has not had [place] services. Pt does not use any DME. CSW spoke with pts daughter about SNF at D/C if recommended at D/C. Pts daughter states they will be agreeable if this is recommending. CSW explained that TOC will follow up after PT has seen pt. TOC to follow.   Expected Discharge Plan: Skilled Nursing Facility Barriers to Discharge: Continued Medical Work up   Patient Goals and CMS Choice Patient states their goals for this hospitalization and ongoing recovery are:: get better CMS Medicare.gov Compare Post Acute Care list provided to:: Patient Represenative (must comment) Choice offered to / list presented to : Adult Children, Patient [place] ownership interest in [facility].provided to:: Adult Children    Expected Discharge Plan and Services In-house Referral: Clinical Social Work Discharge Planning Services: CM Consult   Living arrangements for the past 2 months: Single Family Home                                      Prior Living Arrangements/Services Living arrangements for the past 2 months: Single Family Home Lives with:: Adult Children Patient language and need for interpreter reviewed:: Yes Do you feel safe going back to the place where you live?: Yes      Need for Family Participation in Patient Care: Yes (Comment) Care giver support system in place?: Yes (comment)   Criminal Activity/Legal Involvement Pertinent to Current Situation/Hospitalization: No - Comment as needed  Activities of  Daily Living Home Assistive Devices/Equipment: None ADL Screening (condition at time of admission) Patient's cognitive ability adequate to safely complete daily activities?: Yes Is the patient deaf or have difficulty hearing?: No Does the patient have difficulty seeing, even when wearing glasses/contacts?: No Does the patient have difficulty concentrating, remembering, or making decisions?: Yes Patient able to express need for assistance with ADLs?: Yes Does the patient have difficulty dressing or bathing?: No Independently performs ADLs?: No Communication: Independent Dressing (OT): Needs assistance Is this a change from baseline?: Change from baseline, expected to last <3days Grooming: Needs assistance Is this a change from baseline?: Change from baseline, expected to last <3 days Feeding: Independent Bathing: Needs assistance Is this a change from baseline?: Change from baseline, expected to last <3 days Toileting: Needs assistance Is this a change from baseline?: Change from baseline, expected to last <3 days In/Out Bed: Needs assistance Is this a change from baseline?: Change from baseline, expected to last <3 days Walks in Home: Needs assistance Is this a change from baseline?: Change from baseline, expected to last <3 days Does the patient have difficulty walking or climbing stairs?: Yes Weakness of Legs: Both Weakness of Arms/Hands: None  Permission Sought/Granted                  Emotional Assessment Appearance:: Appears stated age       Alcohol / Substance Use: Not Applicable  Psych Involvement: No (comment)  Admission diagnosis:  Closed displaced fracture of right femoral neck ([place]) [S72.001A] Patient Active Problem List   Diagnosis Date Noted   Closed displaced fracture of right femoral neck ([place]) 10/08/2022   Dementia without behavioral disturbance ([place]) 10/08/2022   Fall at home, initial encounter 10/08/2022   Aortic valve stenosis, nonrheumatic  03/24/2022   Controlled diabetes mellitus type II without complication ([place]) [id number]   Disorder of mitral valve 03/24/2022   Essential (primary) hypertension 03/24/2022   Fibrosis lung ([place]) 03/24/2022   Iron deficiency anemia 12/15/2016   B12 deficiency 06/08/2016   Breast CA ([place]) 06/26/2014   Malignant neoplasm of upper-outer quadrant of RIGHT female breast ([place]) 04/25/2013   Osteoporosis 08/10/2007   COUGH, CHRONIC 06/28/2007   PCP:  [name], MD Pharmacy:   [facility] [place] [facility] [id number] [place] [address] Phone: [phone] Fax: [phone]     Social Determinants of Health (SDOH) Social History: SDOH Screenings   Food Insecurity: No Food Insecurity (10/08/2022)  Housing: Low Risk  (10/08/2022)  Transportation Needs: No Transportation Needs (10/08/2022)  Utilities: Not At Risk (10/08/2022)  Tobacco Use: Medium Risk (10/08/2022)   SDOH Interventions:     Readmission Risk Interventions     No data to display

## 2022-10-09 NOTE — Anesthesia Procedure Notes (Signed)
Procedure Name: MAC Date/Time: 10/09/2022 2:01 PM  Performed by: Maude Leriche, CRNAPre-anesthesia Checklist: Patient identified, Emergency Drugs available, Suction available, Patient being monitored and Timeout performed Oxygen Delivery Method: Nasal cannula Placement Confirmation: positive ETCO2 Dental Injury: Teeth and Oropharynx as per pre-operative assessment

## 2022-10-09 NOTE — Interval H&P Note (Signed)
History and Physical Interval Note:  10/09/2022 1:40 PM  Tamara [name]  has presented today for surgery, with the diagnosis of right hip fracture.  The various methods of treatment have been discussed with the patient and family. After consideration of risks, benefits and other options for treatment, the patient has consented to  Procedure(s): ARTHROPLASTY BIPOLAR HIP (HEMIARTHROPLASTY) (Right) as a surgical intervention.  The patient's history has been reviewed, patient examined, no change in status, stable for surgery.  I have reviewed the patient's chart and labs.  Questions were answered to the patient's satisfaction.     Arther Abbott

## 2022-10-09 NOTE — Brief Op Note (Signed)
10/09/2022  4:10 PM  PATIENT:  Tamara [name]  89 y.o. female  PRE-OPERATIVE DIAGNOSIS:  right hip fracture  POST-OPERATIVE DIAGNOSIS:  right hip fracture  PROCEDURE:  Procedure(s): ARTHROPLASTY BIPOLAR HIP (HEMIARTHROPLASTY) (Right)      Findings complete fracture right femoral neck  Implant DePuy press-fit 5 stem, 1.5 neck, 45 head  Approach direct lateral  SURGEON:  Surgeon(s) and Role:    Carole Civil, MD - Primary  PHYSICIAN ASSISTANT:   ASSISTANTS: Vevelyn Royals    ANESTHESIA:   spinal  EBL:  100 mL   BLOOD ADMINISTERED:none  DRAINS: none   LOCAL MEDICATIONS USED:  MARCAINE     SPECIMEN:  No Specimen  DISPOSITION OF SPECIMEN:  N/A  COUNTS:  YES  TOURNIQUET:  * No tourniquets in log *  DICTATION: .Dragon Dictation  PLAN OF CARE: Admit to inpatient   PATIENT DISPOSITION:  PACU - hemodynamically stable.   Delay start of Pharmacological VTE agent (>24hrs) due to surgical blood loss or risk of bleeding: yes

## 2022-10-09 NOTE — Op Note (Signed)
ARTHROPLASTY BIPOLAR HIP (HEMIARTHROPLASTY) (Right)      Findings complete fracture right femoral neck  Implant DePuy press-fit 5 stem, 1.5 neck, 45 head  Implant Name Type Inv. Item Serial No. Manufacturer Lot No. LRB No. Used Action  HEAD FEM STD 28X+1.5 STRL - [id number] Hips HEAD FEM STD 28X+1.5 STRL  [employer] [id number] Right 1 Implanted  BIPOLAR PROS AML 45 - [id number] Hips BIPOLAR PROS AML 45  DEPUY ORTH[employer] [id number] Right 1 Implanted  STEM SUM[place] BASIC PRESSFIT SZ5 - [id number] Hips STEM [employer] BASIC PRESSFIT SZ5  DEPUY ORTHOPA[employer] [id number] Right 1 Implanted      Approach direct lateral  Details of procedure the patient was seen in the preop area reviewed and examined and cleared for surgery.  Surgical site was marked.  Patient was taken to the operating room.  She already had a Foley catheter in place.  Dr. Charna Elizabeth placed a spinal anesthetic she was then placed in the lateral decubitus position right side up the hip positioner was used to hold her in place.  She was prepped and draped sterilely we did use an axillary roll we padded her legs  Timeout was completed images were available implants had already been checked.  The incision was made centered over the greater trochanter a direct lateral approach was used the subcutaneous tissue was divided down to the fascia.  The fascia was split in line with the skin incision and retracted.  A greater trochanteric bursectomy was performed and the abductors were peeled from the greater trochanter in continuity with the vastus lateralis the branch of the anterior femoral circumflex artery was coagulated.  The underlying capsule was dissected sharply from the overlying musculature and a T capsulotomy was performed and the capsule was preserved intact with Vicryl sutures  The acetabulum was inspected after removal of the femoral head which measured 45 mm  After irrigation and removal of bone fragments the hip was  dislocated anteriorly and the hip was prepared in the proximal region in the following manner:  Box osteotome  Starter reamer  Trochanteric reamer  Serial broaching.  We used a +1.5 , 45 neck with a 5 broach and the hip was too tight  I then tried a unipolar -3 neck length and it was still too tight  I then cut the femoral neck with a neck cutting guide.  Note I did not cut the femoral neck initially because the fracture essentially made a almost perfect 45 degree angle cut at the neck.  We removed approximately 3 mm of bone rebroach the femur used a 1.5 neck and 45 head and got an excellent reduction with good leg length restoration hip flexion.  We checked the sleeping position it was stable external rotation and extension was also stable  We irrigated the wound copiously we injected the capsule and soft tissues with Marcaine with epinephrine we closed the capsule with #1 Vicryl we closed the vastus lateralis gluteus musculature sling with #1 Vicryl and multiple sutures  We closed the fascia with #1 Braylon and the subcu was closed with 0 Monocryl and a running subcuticular 2-0 Monocryl  Steri-Strips and sterile bandage was   The patient was placed back on a regular bed    Postop plan  Weightbearing as tolerated I do not really have any hip precautions other than extreme extension external rotation avoid that DVT prevention for 35 days Follow-up in 2 weeks to check the wound   SURGEON:  Surgeon(s) and Role:    *  Carole Civil, MD - Primary  PHYSICIAN ASSISTANT:   ASSISTANTS: Vevelyn Royals    ANESTHESIA:   spinal  EBL:  100 mL   BLOOD ADMINISTERED:none  DRAINS: none   LOCAL MEDICATIONS USED:  MARCAINE     SPECIMEN:  No Specimen  DISPOSITION OF SPECIMEN:  N/A  COUNTS:  YES  TOURNIQUET:  * No tourniquets in log *  DICTATION: .Dragon Dictation  PLAN OF CARE: Admit to inpatient   PATIENT DISPOSITION:  PACU - hemodynamically stable.   Delay start of  Pharmacological VTE agent (>24hrs) due to surgical blood loss or risk of bleeding: yes

## 2022-10-09 NOTE — Transfer of Care (Signed)
Immediate Anesthesia Transfer of Care Note  Patient: Tamara [name]  Procedure(s) Performed: ARTHROPLASTY BIPOLAR HIP (HEMIARTHROPLASTY) (Right: Hip)  Patient Location: PACU  Anesthesia Type:General  Level of Consciousness: awake and drowsy  Airway & Oxygen Therapy: Patient Spontanous Breathing and Patient connected to nasal cannula oxygen  Post-op Assessment: Report given to RN and Post -op Vital signs reviewed and stable  Post vital signs: Reviewed and stable  Last Vitals:  Vitals Value Taken Time  BP 138/79 10/09/22 1613  Temp 97.6 10/09/22   16 16   Pulse 95 10/09/22 1613  Resp 19 10/09/22 1614  SpO2 90 % 10/09/22 1613  Vitals shown include unvalidated device data.  Last Pain:  Vitals:   10/09/22 1243  TempSrc:   PainSc: 0-No pain      Patients Stated Pain Goal: 0 ([id number])  Complications: No notable events documented.

## 2022-10-10 ENCOUNTER — Inpatient Hospital Stay (HOSPITAL_COMMUNITY): Payer: PPO

## 2022-10-10 DIAGNOSIS — F0390 Unspecified dementia without behavioral disturbance: Secondary | ICD-10-CM | POA: Diagnosis not present

## 2022-10-10 DIAGNOSIS — R5082 Postprocedural fever: Secondary | ICD-10-CM | POA: Diagnosis not present

## 2022-10-10 DIAGNOSIS — S72001A Fracture of unspecified part of neck of right femur, initial encounter for closed fracture: Secondary | ICD-10-CM | POA: Diagnosis not present

## 2022-10-10 DIAGNOSIS — E119 Type 2 diabetes mellitus without complications: Secondary | ICD-10-CM | POA: Diagnosis not present

## 2022-10-10 LAB — URINALYSIS, W/ REFLEX TO CULTURE (INFECTION SUSPECTED)
Bilirubin Urine: NEGATIVE
Glucose, UA: NEGATIVE mg/dL
Ketones, ur: 20 mg/dL — AB
Nitrite: NEGATIVE
Protein, ur: 100 mg/dL — AB
Specific Gravity, Urine: 1.023 (ref 1.005–1.030)
WBC, UA: >50 WBC/hpf (ref 0–5)
pH: 5 (ref 5.0–8.0)

## 2022-10-10 LAB — CBC
HCT: 29.6 % — ABNORMAL LOW (ref 36.0–46.0)
Hemoglobin: 9.6 g/dL — ABNORMAL LOW (ref 12.0–15.0)
MCH: 31.2 pg (ref 26.0–34.0)
MCHC: 32.4 g/dL (ref 30.0–36.0)
MCV: 96.1 fL (ref 80.0–100.0)
Platelets: 132 10*3/uL — ABNORMAL LOW (ref 150–400)
RBC: 3.08 MIL/uL — ABNORMAL LOW (ref 3.87–5.11)
RDW: 14.2 % (ref 11.5–15.5)
WBC: 9.4 10*3/uL (ref 4.0–10.5)
nRBC: 0 % (ref 0.0–0.2)

## 2022-10-10 LAB — COMPREHENSIVE METABOLIC PANEL
ALT: 19 U/L (ref 0–44)
AST: 43 U/L — ABNORMAL HIGH (ref 15–41)
Albumin: 2.8 g/dL — ABNORMAL LOW (ref 3.5–5.0)
Alkaline Phosphatase: 56 U/L (ref 38–126)
Anion gap: 7 (ref 5–15)
BUN: 23 mg/dL (ref 8–23)
CO2: 22 mmol/L (ref 22–32)
Calcium: 7.3 mg/dL — ABNORMAL LOW (ref 8.9–10.3)
Chloride: 110 mmol/L (ref 98–111)
Creatinine, Ser: 1.11 mg/dL — ABNORMAL HIGH (ref 0.44–1.00)
GFR, Estimated: 48 mL/min — ABNORMAL LOW (ref >60)
Glucose, Bld: 110 mg/dL — ABNORMAL HIGH (ref 70–99)
Potassium: 3.7 mmol/L (ref 3.5–5.1)
Sodium: 139 mmol/L (ref 135–145)
Total Bilirubin: 1.5 mg/dL — ABNORMAL HIGH (ref 0.3–1.2)
Total Protein: 5.6 g/dL — ABNORMAL LOW (ref 6.5–8.1)

## 2022-10-10 LAB — HEMOGLOBIN A1C
Hgb A1c MFr Bld: 5.9 % — ABNORMAL HIGH (ref 4.8–5.6)
Mean Plasma Glucose: 123 mg/dL

## 2022-10-10 LAB — PROCALCITONIN: Procalcitonin: 0.41 ng/mL

## 2022-10-10 MED ORDER — LACTATED RINGERS IV BOLUS
1000.0000 mL | Freq: Once | INTRAVENOUS | Status: AC
  Administered 2022-10-10: 1000 mL via INTRAVENOUS

## 2022-10-10 MED ORDER — DONEPEZIL HCL 5 MG PO TABS
10.0000 mg | ORAL_TABLET | Freq: Every day | ORAL | Status: DC
  Administered 2022-10-10 – 2022-10-13 (×4): 10 mg via ORAL
  Filled 2022-10-10 (×5): qty 2

## 2022-10-10 MED ORDER — SODIUM CHLORIDE 0.9 % IV SOLN
1.0000 g | INTRAVENOUS | Status: AC
  Administered 2022-10-10 – 2022-10-13 (×4): 1 g via INTRAVENOUS
  Filled 2022-10-10 (×4): qty 10

## 2022-10-10 NOTE — Progress Notes (Signed)
Subjective: 1 Day Post-Op Procedure(s) (LRB): ARTHROPLASTY BIPOLAR HIP (HEMIARTHROPLASTY) (Right) Patient reports pain as  mild She was confused I reoriented her she did not know that she had had surgery or that she was in the hospital.    Objective: Vital signs in last 24 hours: Temp:  [97.6 F (36.4 C)-102.8 F (39.3 C)] 100.2 F (37.9 C) (03/23 0806) Pulse Rate:  [83-109] 90 (03/23 1025) Resp:  [18-21] 18 (03/23 0806) BP: (88-150)/(46-122) 103/55 (03/23 0806) SpO2:  [88 %-100 %] 95 % (03/23 1025) Weight:  [64.4 kg] 64.4 kg (03/22 1244)  Intake/Output from previous day: 03/22 0701 - 03/23 0700 In: 1695 [I.V.:1495; IV Piggyback:200] Out: 200 [Urine:100; Blood:100] Intake/Output this shift: No intake/output data recorded.  Recent Labs    10/08/22 1612 10/09/22 0451 10/10/22 0926  HGB 12.2 12.9 9.6*   Recent Labs    10/09/22 0451 10/10/22 0926  WBC 11.7* 9.4  RBC 4.06 3.08*  HCT 38.6 29.6*  PLT 197 132*   Recent Labs    10/09/22 0451 10/10/22 0926  NA 136 139  K 3.5 3.7  CL 102 110  CO2 23 22  BUN 20 23  CREATININE 0.89 1.11*  GLUCOSE 128* 110*  CALCIUM 8.5* 7.3*   Recent Labs    10/08/22 1612  INR 1.1    She was asleep I woke her up she responded easily.  Her dressing is dry thigh is minimal swelling her calf is soft and supple   Assessment/Plan: 1 Day Post-Op Procedure(s) (LRB): ARTHROPLASTY BIPOLAR HIP (HEMIARTHROPLASTY) (Right) Advance diet Up with therapy Discharge to SNF when stable medically Monitor hemoglobin which is 9.6 Weight-bear as tolerated Follow-up 2 weeks after surgery for dressing change and wound check    Arther Abbott 10/10/2022, 12:02 PM

## 2022-10-10 NOTE — TOC Progression Note (Signed)
Transition of Care [facility]) - Progression Note    Patient Details  Name: Tamara [name] MRN: [id number] Date of Birth: 07/31/1933  [facility]) CM/SW Contact  Boneta Lucks, RN Phone Number: 10/10/2022, 4:16 PM  Clinical Narrative:   Cm spoke with Daughter, Helene Kelp. PT is recommending SNF. They want [facility]. FL2 completed and sent out for bed offers. TOC following.    Expected Discharge Plan: [place] Barriers to Discharge: Continued Medical Work up  Expected Discharge Plan and Services In-house Referral: Clinical Social Work Discharge Planning Services: CM Consult   Living arrangements for the past 2 months: Single Family Home                    Social Determinants of Health (SDOH) Interventions SDOH Screenings   Food Insecurity: No Food Insecurity (10/08/2022)  Housing: Low Risk  (10/08/2022)  Transportation Needs: No Transportation Needs (10/08/2022)  Utilities: Not At Risk (10/08/2022)  Tobacco Use: Medium Risk (10/09/2022)    Readmission Risk Interventions     No data to display

## 2022-10-10 NOTE — Plan of Care (Signed)
  Problem: Acute Rehab PT Goals(only PT should resolve) Goal: Pt Will Go Supine/Side To Sit Flowsheets (Taken 10/10/2022 1304) Pt will go Supine/Side to Sit: with min guard assist Goal: Pt Will Go Sit To Supine/Side Flowsheets (Taken 10/10/2022 1304) Pt will go Sit to Supine/Side: with min guard assist Goal: Pt Will Transfer Bed To Chair/Chair To Bed Flowsheets (Taken 10/10/2022 1304) Pt will Transfer Bed to Chair/Chair to Bed: with mod assist Goal: Pt Will Ambulate Flowsheets (Taken 10/10/2022 1304) Pt will Ambulate:  25 feet  with moderate assist  with rolling walker

## 2022-10-10 NOTE — Progress Notes (Signed)
Pt oriented to self. Disoriented to place time situation. fever 100.2 this morning tylenol given. O2 was 88-90 on RA so I put her on 2L for now to keep her above 92%. foley catheter still present MD informed no new orders placed for removal yet. Orders placed for UA/UC, blood cultures, and chest xray. Patient denies any pain at this present time.

## 2022-10-10 NOTE — Evaluation (Signed)
Physical Therapy Evaluation Patient Details Name: Tamara [name] MRN: [id number] DOB: 10/15/1933 Today's Date: 10/10/2022  History of Present Illness  Tamara [name] is a 89 y.o. female.  Level 5 caveat secondary to dementia.  Patient brought in by EMS from home after fall unwitnessed.  Patient does not recall the event.  She states she lives with her daughter.  Pt sustained a Rt hip fx and underwent Bipolar repair on 10/08/22.  Clinical Impression  Pt agreeable to therapy. Pt needs mod to max assist for bed mobility and transfers and will benefit from skilled therapy to improve her functional ability as pt was I with ambulation without assisted device prior to fall.        Recommendations for follow up therapy are one component of a multi-disciplinary discharge planning process, led by the attending physician.  Recommendations may be updated based on patient status, additional functional criteria and insurance authorization.  Follow Up Recommendations Skilled nursing-short term rehab (<3 hours/day) Can patient physically be transported by private vehicle: No    Assistance Recommended at Discharge Frequent or constant Supervision/Assistance  Patient can return home with the following  A lot of help with walking and/or transfers;A lot of help with bathing/dressing/bathroom;Assistance with cooking/housework;Direct supervision/assist for medications management    Equipment Recommendations None recommended by PT  Recommendations for Other Services       Functional Status Assessment Patient has had a recent decline in their functional status and demonstrates the ability to make significant improvements in function in a reasonable and predictable amount of time.     Precautions / Restrictions Precautions Precaution Comments: no excessive extension or ER on Rt hip Restrictions Weight Bearing Restrictions: Yes RLE Weight Bearing: Weight bearing as tolerated      Mobility  Bed  Mobility Overal bed mobility: Needs Assistance Bed Mobility: Sidelying to Sit, Supine to Sit   Sidelying to sit: Max assist            Transfers Overall transfer level: Needs assistance Equipment used: Rolling walker (2 wheels) Transfers: Sit to/from Stand, Bed to chair/wheelchair/BSC Sit to Stand: Max assist Stand pivot transfers: Mod assist              Ambulation/Gait   Gait Distance (Feet): 0 Feet                 Balance                                             Pertinent Vitals/Pain Pain Assessment Pain Assessment: Faces Faces Pain Scale: Hurts little more Pain Descriptors / Indicators: Aching Pain Intervention(s): Limited activity within patient's tolerance    Home Living                          Prior Function                       Hand Dominance        Extremity/Trunk Assessment        Lower Extremity Assessment Lower Extremity Assessment: Generalized weakness       Communication      Cognition Arousal/Alertness: Awake/alert Behavior During Therapy: WFL for tasks assessed/performed Overall Cognitive Status: Within Functional Limits for tasks assessed  General Comments      Exercises Total Joint Exercises Ankle Circles/Pumps: Both, 5 reps Quad Sets: Both, 10 reps Heel Slides: AAROM, Both Hip ABduction/ADduction: Both, 5 reps, AAROM Straight Leg Raises: Both   Assessment/Plan    PT Assessment Patient needs continued PT services  PT Problem List Decreased strength;Decreased activity tolerance;Decreased balance;Decreased mobility;Pain       PT Treatment Interventions DME instruction;Gait training;Functional mobility training;Therapeutic activities;Therapeutic exercise;Balance training;Patient/family education    PT Goals (Current goals can be found in the Care Plan section)       Frequency Min 4X/week        AM-PAC  PT "6 Clicks" Mobility  Outcome Measure Help needed turning from your back to your side while in a flat bed without using bedrails?: A Lot Help needed moving from lying on your back to sitting on the side of a flat bed without using bedrails?: A Lot Help needed moving to and from a bed to a chair (including a wheelchair)?: A Lot Help needed standing up from a chair using your arms (e.g., wheelchair or bedside chair)?: A Lot Help needed to walk in hospital room?: A Lot Help needed climbing 3-5 steps with a railing? : Total 6 Click Score: 11    End of Session Equipment Utilized During Treatment: Gait belt Activity Tolerance: Patient limited by pain Patient left: in chair;with call bell/phone within reach;with family/visitor present Nurse Communication: Mobility status PT Visit Diagnosis: Unsteadiness on feet (R26.81);Muscle weakness (generalized) (M62.81);Difficulty in walking, not elsewhere classified (R26.2)    Time: 1230-1300 PT Time Calculation (min) (ACUTE ONLY): 30 min   Charges:   PT Evaluation $PT Eval Low Complexity: 1 Low PT Treatments $Therapeutic Exercise: 8-22 mins        Rayetta Humphrey, PT CLT [phone]  10/10/2022, 1:06 PM

## 2022-10-10 NOTE — [place] FL2 (Signed)
[place] MEDICAID FL2 LEVEL OF CARE FORM     IDENTIFICATION  Patient Name: Tamara [name] Birthdate: 03/18/1934 Sex: female Admission Date (Current Location): 10/08/2022  [facility] and [place] Number:  [employer] and Address:  [place] [address] [place]      Provider Number: [id number]  Attending Physician Name and Address:  Orson Eva, MD  Relative Name and Phone Number:  Binnie Kand (Daughter) [phone]    Current Level of Care: Hospital Recommended Level of Care: [place] Prior Approval Number:    Date Approved/Denied:   PASRR Number: [id number]  Discharge Plan: SNF    Current Diagnoses: Patient Active Problem List   Diagnosis Date Noted   Postoperative fever 10/10/2022   Closed displaced fracture of right femoral neck ([place]) 10/08/2022   Dementia without behavioral disturbance ([place]) 10/08/2022   Fall at home, initial encounter 10/08/2022   Aortic valve stenosis, nonrheumatic 03/24/2022   Controlled diabetes mellitus type II without complication ([place]) [id number]   Disorder of mitral valve 03/24/2022   Essential (primary) hypertension 03/24/2022   Fibrosis lung ([place]) 03/24/2022   Iron deficiency anemia 12/15/2016   B12 deficiency 06/08/2016   Breast CA ([place]) 06/26/2014   Malignant neoplasm of upper-outer quadrant of RIGHT female breast ([place]) 04/25/2013   Osteoporosis 08/10/2007   COUGH, CHRONIC 06/28/2007    Orientation RESPIRATION BLADDER Height & Weight     Self  Other (Comment) (See DC summary) External catheter Weight: 64.4 kg Height:     BEHAVIORAL SYMPTOMS/MOOD NEUROLOGICAL BOWEL NUTRITION STATUS      Continent Diet (See DC summary)  AMBULATORY STATUS COMMUNICATION OF NEEDS Skin   Extensive Assist Verbally Surgical wounds                       Personal Care Assistance Level of Assistance  Bathing, Feeding, Dressing Bathing Assistance: Maximum assistance Feeding assistance:  Limited assistance Dressing Assistance: Maximum assistance     Functional Limitations Info  Sight, Hearing, Speech Sight Info: Impaired Hearing Info: Impaired Speech Info: Adequate    SPECIAL CARE FACTORS FREQUENCY  PT (By licensed PT)     PT Frequency: 5 times a week              Contractures Contractures Info: Not present    Additional Factors Info  Code Status, Allergies Code Status Info: FULL Allergies Info: NKDA           Current Medications (10/10/2022):  This is the current hospital active medication list Current Facility-Administered Medications  Medication Dose Route Frequency Provider Last Rate Last Admin   acetaminophen (TYLENOL) tablet 650 mg  650 mg Oral Q6H PRN Emokpae, Ejiroghene E, MD   650 mg at 10/10/22 0810   Or   acetaminophen (TYLENOL) suppository 650 mg  650 mg Rectal Q6H PRN Emokpae, Ejiroghene E, MD       morphine (PF) 2 MG/ML injection 1 mg  1 mg Intravenous Q4H PRN Emokpae, Ejiroghene E, MD   1 mg at 10/10/22 1021   ondansetron (ZOFRAN) tablet 4 mg  4 mg Oral Q6H PRN Emokpae, Ejiroghene E, MD       Or   ondansetron (ZOFRAN) injection 4 mg  4 mg Intravenous Q6H PRN Emokpae, Ejiroghene E, MD   4 mg at 10/09/22 1438   polyethylene glycol (MIRALAX / GLYCOLAX) packet 17 g  17 g Oral Daily PRN Emokpae, Ejiroghene E, MD       traZODone ([place])  tablet 50 mg  50 mg Oral QHS PRN Emokpae, Ejiroghene E, MD         Discharge Medications: Please see discharge summary for a list of discharge medications.  Relevant Imaging Results:  Relevant Lab Results:   Additional Information SS# [id number]  Boneta Lucks, RN

## 2022-10-10 NOTE — Progress Notes (Signed)
PROGRESS NOTE  Tamara [name] [id number] DOB: Feb 09, 1934 DOA: 10/08/2022 PCP: [name], MD  Brief History:  89 year old female with a history of dementia, diabetes mellitus, hypertension, right breast cancer, and B12 deficiency presenting after an unwitnessed fall with right leg pain.  Apparently, the patient was sleeping some cereal that showed a spilled near the kitchen when she sustained a fall.  She subsequently crawled and made her way to a cellular phone and contacted her daughter.  Her daughter came home from work and found the patient laying on her right side awake and alert.  It was felt that patient likely tripped when she was sweeping up some cereal.  She did not hit her head.  Unfortunately, the patient is able to provide history secondary to her dementia.  EMS was activated and the patient was brought to emergency department. In the ED, the patient had low-grade temperature of 99.6.  She was hemodynamically stable with oxygen saturation 95% room air.  WBC 12.0, hemoglobin 12.2, platelets 197,000.  Sodium 136, potassium 3.5, bicarbonate 23, serum creatinine 0.89.  EKG shows sinus rhythm without concerning ST ST wave changes.  X-ray of the right pelvis showed a right femoral neck fracture.  Orthopedics, Dr. Aline Brochure was consulted.  At baseline, the patient ambulates independently without any assistance.  She was able to perform all her activities of daily living, but does need some assistance with bathing to prevent falls.  Prior to her injury, the patient had not complained of any new complaints.  There is no fevers, chills, chest pain, shortness breath, nausea, vomit, diarrhea, abdominal pain, hematochezia, melena Pt underwent Right hip bipolar arthroplasty on 10/09/22 by Dr. Aline Brochure.  She developed a postop fever up to 102.8 on the evening 102.8   Assessment/Plan: Right femoral neck fracture -At baseline, patient ambulates without assistance -10/09/22--Right bipolar hip  arthroplasty-Dr. Aline Brochure -Judicious opioids -PT/OT  Post-op Fever -10/09/22 evening--fever 102.8 -obtain blood cultures x 2 -UA/Culture -CXR -PCT   Dementia without behavioral disturbance -Continue Aricept   Essential hypertension -Holding amlodipine and losartan temporarily -BP soft   Diabetes mellitus type 2, controlled -Not on any medications as an outpatient -Check hemoglobin A1c   Right breast cancer -Currently in remission -Status post lumpectomy and lymph node dissection August 2014 -Patient declined radiation therapy -s/p Aromasin October 2014, continued till October 2019.  -Follow-up Dr. Delton Coombes             Family Communication:  daughter updated 3/23   Consultants:  ortho--[place]   Code Status:  FULL    DVT Prophylaxis:  [place] Heparin on hold for surgery     Procedures: As Listed in Progress Note Above   Antibiotics: None          Subjective: Pt complains of pain in right leg.  Denies headache, cp, sob, n/v/d,abd pain  Objective: Vitals:   10/09/22 2305 10/10/22 0325 10/10/22 0806 10/10/22 0810  BP: 100/70 (!) 112/53 (!) 103/55   Pulse: 91 96 95   Resp: 20 20 18    Temp: (!) 101.6 F (38.7 C) 99.9 F (37.7 C) 100.2 F (37.9 C)   TempSrc: Oral Oral Oral   SpO2:  91% (!) 88% 98%  Weight:        Intake/Output Summary (Last 24 hours) at 10/10/2022 0844 Last data filed at 10/09/2022 1741 Gross per 24 hour  Intake 1695 ml  Output 200 ml  Net 1495 ml   Weight change: 0  kg Exam:  General:  Pt is alert, follows commands appropriately, not in acute distress HEENT: No icterus, No thrush, No neck mass, [place]/AT Cardiovascular: RRR, S1/S2, no rubs, no gallops Respiratory: bibasilar crackles, R>L.  No wheeze Abdomen: Soft/+BS, non tender, non distended, no guarding Extremities: No edema, No lymphangitis, No petechiae, No rashes, no synovitis   Data Reviewed: I have personally reviewed following labs and imaging studies Basic  Metabolic Panel: Recent Labs  Lab 10/08/22 1612 10/09/22 0451  NA 137 136  K 4.3 3.5  CL 104 102  CO2 23 23  GLUCOSE 120* 128*  BUN 23 20  CREATININE 0.93 0.89  CALCIUM 8.6* 8.5*   Liver Function Tests: No results for input(s): "AST", "ALT", "ALKPHOS", "BILITOT", "PROT", "ALBUMIN" in the last 168 hours. No results for input(s): "LIPASE", "AMYLASE" in the last 168 hours. No results for input(s): "AMMONIA" in the last 168 hours. Coagulation Profile: Recent Labs  Lab 10/08/22 1612  INR 1.1   CBC: Recent Labs  Lab 10/08/22 1612 10/09/22 0451  WBC 12.0* 11.7*  NEUTROABS 10.3*  --   HGB 12.2 12.9  HCT 36.6 38.6  MCV 94.8 95.1  PLT 195 197   Cardiac Enzymes: No results for input(s): "CKTOTAL", "CKMB", "CKMBINDEX", "TROPONINI" in the last 168 hours. BNP: Invalid input(s): "POCBNP" CBG: No results for input(s): "GLUCAP" in the last 168 hours. HbA1C: Recent Labs    10/08/22 1612  HGBA1C 5.9*   Urine analysis: No results found for: "COLORURINE", "APPEARANCEUR", "LABSPEC", "PHURINE", "GLUCOSEU", "HGBUR", "BILIRUBINUR", "KETONESUR", "PROTEINUR", "UROBILINOGEN", "NITRITE", "LEUKOCYTESUR" Sepsis Labs: @LABRCNTIP (procalcitonin:4,lacticidven:4) )No results found for this or any previous visit (from the past 240 hour(s)).   Scheduled Meds: Continuous Infusions:  sodium chloride 75 mL/hr at 10/09/22 2303    Procedures/Studies: DG Pelvis Portable  Result Date: 10/09/2022 CLINICAL DATA:  Postop EXAM: PORTABLE PELVIS 2 VIEWS COMPARISON:  None Available. FINDINGS: Patient is status post right total hip arthroplasty. Pelvic ring appears to be intact. The osseous structures are osteopenic. There is anatomic alignment. No acute fracture is seen. IMPRESSION: Postop right hip arthroplasty. Electronically Signed   By: Sammie Bench M.D.   On: 10/09/2022 16:33   CT Head Wo Contrast  Result Date: 10/08/2022 CLINICAL DATA:  [place] onto floor, dimension EXAM: CT HEAD WITHOUT  CONTRAST TECHNIQUE: Contiguous axial images were obtained from the base of the skull through the vertex without intravenous contrast. RADIATION DOSE REDUCTION: This exam was performed according to the departmental dose-optimization program which includes automated exposure control, adjustment of the mA and/or kV according to patient size and/or use of iterative reconstruction technique. COMPARISON:  05/16/2022 FINDINGS: Brain: No acute infarct or hemorrhage. Stable chronic small-vessel ischemic changes throughout the periventricular white matter. Lateral ventricles and midline structures are unremarkable. No acute extra-axial fluid collections. No mass effect. Vascular: No hyperdense vessel or unexpected calcification. Skull: Normal. Negative for fracture or focal lesion. Sinuses/Orbits: Chronic sphenoid sinus mucosal thickening. No gas fluid levels. Other: None. IMPRESSION: 1. Stable head CT, no acute intracranial process. Electronically Signed   By: Randa Ngo M.D.   On: 10/08/2022 18:17   DG Knee 2 Views Right  Result Date: 10/08/2022 CLINICAL DATA:  fall pain EXAM: RIGHT KNEE - 2 VIEW COMPARISON:  None Available. FINDINGS: Osseous structures are osteopenic. No evidence of fracture, dislocation, or joint effusion. There is tricompartmental joint space narrowing, sclerosis and osteophytes consistent with degenerative joint disease. No evidence of effusion. IMPRESSION: Degenerative changes. Osteopenia. Electronically Signed   By: Samuel Germany.D.  On: 10/08/2022 17:45   DG Hip Unilat With Pelvis 2-3 Views Right  Result Date: 10/08/2022 CLINICAL DATA:  Fall, pain EXAM: DG HIP (WITH OR WITHOUT PELVIS) 2-3V RIGHT COMPARISON:  None Available. FINDINGS: Right femoral neck subcapital fracture with varus angulation. Osseous structures are osteopenic. Pelvic ring appears intact PAC. Lumbosacral degenerative changes. IMPRESSION: Right femoral neck fracture. Electronically Signed   By: Sammie Bench M.D.    On: 10/08/2022 17:44   DG Chest 1 View  Result Date: 10/08/2022 CLINICAL DATA:  Fall, pain EXAM: CHEST  1 VIEW COMPARISON:  07/21/2018 FINDINGS: The heart size and mediastinal contours are within normal limits. Both lungs are clear. Aorta is calcified. Osseous structures are not well evaluated. IMPRESSION: No active disease. Electronically Signed   By: Sammie Bench M.D.   On: 10/08/2022 17:42    Orson Eva, DO  [facility]  If 7PM-7AM, please contact night-coverage [email or web address] Password [facility] 10/10/2022, 8:44 AM   LOS: 2 days

## 2022-10-10 NOTE — Progress Notes (Signed)
Pt turned yellow mews due to temp and elevated bp. MD aware. New orders given. Pt turned red mews at next vital check due to elevated bp. MD aware. New orders given. Pt has now turned back to yellow mews. Mews protocol still in place. Pt has no signs of pain or distress. Alert and pleasant with confusion. Foley cath draining clear yellow urine. Bed alarm on and fall mat in place. Plan of care ongoing.

## 2022-10-10 NOTE — Progress Notes (Signed)
Clarified DC foley with Aline Brochure. States ok to remove. Foley catheter removed. Pt tolerated well. Drained 400 ml from foley bag. Peri care provided. Pur wick placed.

## 2022-10-11 ENCOUNTER — Inpatient Hospital Stay (HOSPITAL_COMMUNITY): Payer: PPO

## 2022-10-11 DIAGNOSIS — N3091 Cystitis, unspecified with hematuria: Secondary | ICD-10-CM

## 2022-10-11 DIAGNOSIS — E119 Type 2 diabetes mellitus without complications: Secondary | ICD-10-CM | POA: Diagnosis not present

## 2022-10-11 DIAGNOSIS — S72001A Fracture of unspecified part of neck of right femur, initial encounter for closed fracture: Secondary | ICD-10-CM | POA: Diagnosis not present

## 2022-10-11 DIAGNOSIS — F0390 Unspecified dementia without behavioral disturbance: Secondary | ICD-10-CM | POA: Diagnosis not present

## 2022-10-11 LAB — CBC
HCT: 28.7 % — ABNORMAL LOW (ref 36.0–46.0)
Hemoglobin: 9.4 g/dL — ABNORMAL LOW (ref 12.0–15.0)
MCH: 31.5 pg (ref 26.0–34.0)
MCHC: 32.8 g/dL (ref 30.0–36.0)
MCV: 96.3 fL (ref 80.0–100.0)
Platelets: 123 10*3/uL — ABNORMAL LOW (ref 150–400)
RBC: 2.98 MIL/uL — ABNORMAL LOW (ref 3.87–5.11)
RDW: 14.1 % (ref 11.5–15.5)
WBC: 8.1 10*3/uL (ref 4.0–10.5)
nRBC: 0 % (ref 0.0–0.2)

## 2022-10-11 LAB — COMPREHENSIVE METABOLIC PANEL
ALT: 19 U/L (ref 0–44)
AST: 30 U/L (ref 15–41)
Albumin: 2.6 g/dL — ABNORMAL LOW (ref 3.5–5.0)
Alkaline Phosphatase: 55 U/L (ref 38–126)
Anion gap: 7 (ref 5–15)
BUN: 18 mg/dL (ref 8–23)
CO2: 21 mmol/L — ABNORMAL LOW (ref 22–32)
Calcium: 7.3 mg/dL — ABNORMAL LOW (ref 8.9–10.3)
Chloride: 106 mmol/L (ref 98–111)
Creatinine, Ser: 0.83 mg/dL (ref 0.44–1.00)
GFR, Estimated: >60 mL/min (ref >60)
Glucose, Bld: 143 mg/dL — ABNORMAL HIGH (ref 70–99)
Potassium: 3.6 mmol/L (ref 3.5–5.1)
Sodium: 134 mmol/L — ABNORMAL LOW (ref 135–145)
Total Bilirubin: 0.9 mg/dL (ref 0.3–1.2)
Total Protein: 5.5 g/dL — ABNORMAL LOW (ref 6.5–8.1)

## 2022-10-11 LAB — BRAIN NATRIURETIC PEPTIDE: B Natriuretic Peptide: 237 pg/mL — ABNORMAL HIGH (ref 0.0–100.0)

## 2022-10-11 LAB — PROCALCITONIN: Procalcitonin: 0.29 ng/mL

## 2022-10-11 LAB — MAGNESIUM: Magnesium: 2 mg/dL (ref 1.7–2.4)

## 2022-10-11 MED ORDER — HYDROCODONE-ACETAMINOPHEN 5-325 MG PO TABS
1.0000 | ORAL_TABLET | Freq: Four times a day (QID) | ORAL | Status: DC | PRN
  Administered 2022-10-12 – 2022-10-13 (×5): 1 via ORAL
  Filled 2022-10-11 (×6): qty 1

## 2022-10-11 NOTE — Progress Notes (Signed)
PROGRESS NOTE  Tamara [name] [id number] DOB: 05-26-1934 DOA: 10/08/2022 PCP: [name], MD  Brief History:  89 year old female with a history of dementia, diabetes mellitus, hypertension, right breast cancer, and B12 deficiency presenting after an unwitnessed fall with right leg pain.  Apparently, the patient was sleeping some cereal that showed a spilled near the kitchen when she sustained a fall.  She subsequently crawled and made her way to a cellular phone and contacted her daughter.  Her daughter came home from work and found the patient laying on her right side awake and alert.  It was felt that patient likely tripped when she was sweeping up some cereal.  She did not hit her head.  Unfortunately, the patient is able to provide history secondary to her dementia.  EMS was activated and the patient was brought to emergency department. In the ED, the patient had low-grade temperature of 99.6.  She was hemodynamically stable with oxygen saturation 95% room air.  WBC 12.0, hemoglobin 12.2, platelets 197,000.  Sodium 136, potassium 3.5, bicarbonate 23, serum creatinine 0.89.  EKG shows sinus rhythm without concerning ST ST wave changes.  X-ray of the right pelvis showed a right femoral neck fracture.  Orthopedics, Dr. Aline Brochure was consulted.  At baseline, the patient ambulates independently without any assistance.  She was able to perform all her activities of daily living, but does need some assistance with bathing to prevent falls.  Prior to her injury, the patient had not complained of any new complaints.  There is no fevers, chills, chest pain, shortness breath, nausea, vomit, diarrhea, abdominal pain, hematochezia, melena Pt underwent Right hip bipolar arthroplasty on 10/09/22 by Dr. Aline Brochure.  She developed a postop fever up to 102.8 on the evening 102.8   Assessment/Plan: Right femoral neck fracture -At baseline, patient ambulates without assistance -10/09/22--Right bipolar hip  arthroplasty-Dr. Aline Brochure -Judicious opioids -PT/OT>>SNF   Post-op Fever -10/09/22 evening--fever 102.8 -obtain blood cultures x 2--neg to date -UA/>50 WBC -CXR--personally reviewed--poor inspiration, bilateral patchy opacity -PCT 0.41 -CT chest  Pyuria -UA>50 WBC -urine culture was not sent even though it was ordered -continue empiric ceftriaxone   Dementia without behavioral disturbance -Continue Aricept   Essential hypertension -Holding amlodipine and losartan temporarily -BP soft   Diabetes mellitus type 2, controlled -Not on any medications as an outpatient -3/21 hemoglobin A1c--5.9   Right breast cancer -Currently in remission -Status post lumpectomy and lymph node dissection August 2014 -Patient declined radiation therapy -s/p Aromasin October 2014, continued till October 2019.  -Follow-up Dr. Delton Coombes             Family Communication:  daughter updated 3/24   Consultants:  ortho--[place]   Code Status:  FULL    DVT Prophylaxis:  SCDs     Procedures: As Listed in Progress Note Above   Antibiotics: Ceftriaxone 3/23>>        Subjective: Pt denies f/c, cp, sob, n/v/d, abd pain  Objective: Vitals:   10/10/22 1025 10/10/22 1358 10/10/22 2109 10/11/22 1027  BP:  (!) 82/46 126/67 (!) 98/53  Pulse: 90 74 80 80  Resp:  17 18 17   Temp:  98.2 F (36.8 C) 98.6 F (37 C) 99.7 F (37.6 C)  TempSrc:  Oral Oral Oral  SpO2: 95% 100% 100% 98%  Weight:        Intake/Output Summary (Last 24 hours) at 10/11/2022 1637 Last data filed at 10/11/2022 0451 Gross per 24 hour  Intake  90.96 ml  Output 650 ml  Net -559.04 ml   Weight change:  Exam:  General:  Pt is alert, follows commands appropriately, not in acute distress HEENT: No icterus, No thrush, No neck mass, [place]/AT Cardiovascular: RRR, S1/S2, no rubs, no gallops Respiratory: bibasilar rales. No wheeze Abdomen: Soft/+BS, non tender, non distended, no guarding Extremities: trace LE edema, No  lymphangitis, No petechiae, No rashes, no synovitis   Data Reviewed: I have personally reviewed following labs and imaging studies Basic Metabolic Panel: Recent Labs  Lab 10/08/22 1612 10/09/22 0451 10/10/22 0926 10/11/22 1430  NA 137 136 139 134*  K 4.3 3.5 3.7 3.6  CL 104 102 110 106  CO2 23 23 22  21*  GLUCOSE 120* 128* 110* 143*  BUN 23 20 23 18   CREATININE 0.93 0.89 1.11* 0.83  CALCIUM 8.6* 8.5* 7.3* 7.3*  MG  --   --   --  2.0   Liver Function Tests: Recent Labs  Lab 10/10/22 0926 10/11/22 1430  AST 43* 30  ALT 19 19  ALKPHOS 56 55  BILITOT 1.5* 0.9  PROT 5.6* 5.5*  ALBUMIN 2.8* 2.6*   No results for input(s): "LIPASE", "AMYLASE" in the last 168 hours. No results for input(s): "AMMONIA" in the last 168 hours. Coagulation Profile: Recent Labs  Lab 10/08/22 1612  INR 1.1   CBC: Recent Labs  Lab 10/08/22 1612 10/09/22 0451 10/10/22 0926 10/11/22 1430  WBC 12.0* 11.7* 9.4 8.1  NEUTROABS 10.3*  --   --   --   HGB 12.2 12.9 9.6* 9.4*  HCT 36.6 38.6 29.6* 28.7*  MCV 94.8 95.1 96.1 96.3  PLT 195 197 132* 123*   Cardiac Enzymes: No results for input(s): "CKTOTAL", "CKMB", "CKMBINDEX", "TROPONINI" in the last 168 hours. BNP: Invalid input(s): "POCBNP" CBG: No results for input(s): "GLUCAP" in the last 168 hours. HbA1C: No results for input(s): "HGBA1C" in the last 72 hours. Urine analysis:    Component Value Date/Time   COLORURINE AMBER (A) 10/10/2022 0900   APPEARANCEUR TURBID (A) 10/10/2022 0900   LABSPEC 1.023 10/10/2022 0900   PHURINE 5.0 10/10/2022 0900   GLUCOSEU NEGATIVE 10/10/2022 0900   HGBUR MODERATE (A) 10/10/2022 0900   BILIRUBINUR NEGATIVE 10/10/2022 0900   KETONESUR 20 (A) 10/10/2022 0900   PROTEINUR 100 (A) 10/10/2022 0900   NITRITE NEGATIVE 10/10/2022 0900   LEUKOCYTESUR MODERATE (A) 10/10/2022 0900   Sepsis Labs: @LABRCNTIP (procalcitonin:4,lacticidven:4) ) Recent Results (from the past 240 hour(s))  Culture, blood (Routine  X 2) w Reflex to ID Panel     Status: None (Preliminary result)   Collection Time: 10/10/22  9:26 AM   Specimen: BLOOD  Result Value Ref Range Status   Specimen Description BLOOD BLOOD LEFT ARM  Final   Special Requests   Final    Blood Culture results may not be optimal due to an excessive volume of blood received in culture bottles   Culture   Final    NO GROWTH < 24 HOURS Performed at [facility], [address] [place], [place] [address]    Report Status PENDING  Incomplete  Culture, blood (Routine X 2) w Reflex to ID Panel     Status: None (Preliminary result)   Collection Time: 10/10/22  9:26 AM   Specimen: BLOOD  Result Value Ref Range Status   Specimen Description BLOOD BLOOD RIGHT ARM  Final   Special Requests   Final    Blood Culture results may not be optimal due to an excessive  volume of blood received in culture bottles   Culture   Final    NO GROWTH < 24 HOURS Performed at [facility], [address] [place], [place] [address]    Report Status PENDING  Incomplete     Scheduled Meds:  donepezil  10 mg Oral QHS   Continuous Infusions:  cefTRIAXone (ROCEPHIN)  IV Stopped (10/10/22 2209)    Procedures/Studies: DG CHEST PORT 1 VIEW  Result Date: 10/10/2022 CLINICAL DATA:  Table formatting from the original note was not included. Hypoxia EXAM: PORTABLE CHEST - 1 VIEW COMPARISON:  10/08/2022 FINDINGS: Worsening scattered interstitial and coarse airspace opacities throughout both lungs. Mild cardiomegaly.  Aortic Atherosclerosis (ICD10-170.0). Blunting of left lateral costophrenic angle. Visualized bones unremarkable. IMPRESSION: 1. Worsening scattered interstitial and airspace opacities. 2. Mild cardiomegaly. Electronically Signed   By: Lucrezia Europe M.D.   On: 10/10/2022 09:49   DG Pelvis Portable  Result Date: 10/09/2022 CLINICAL DATA:  Postop EXAM: PORTABLE PELVIS 2 VIEWS COMPARISON:  None Available. FINDINGS: Patient is status post right total hip arthroplasty.  Pelvic ring appears to be intact. The osseous structures are osteopenic. There is anatomic alignment. No acute fracture is seen. IMPRESSION: Postop right hip arthroplasty. Electronically Signed   By: Sammie Bench M.D.   On: 10/09/2022 16:33   CT Head Wo Contrast  Result Date: 10/08/2022 CLINICAL DATA:  [place] onto floor, dimension EXAM: CT HEAD WITHOUT CONTRAST TECHNIQUE: Contiguous axial images were obtained from the base of the skull through the vertex without intravenous contrast. RADIATION DOSE REDUCTION: This exam was performed according to the departmental dose-optimization program which includes automated exposure control, adjustment of the mA and/or kV according to patient size and/or use of iterative reconstruction technique. COMPARISON:  05/16/2022 FINDINGS: Brain: No acute infarct or hemorrhage. Stable chronic small-vessel ischemic changes throughout the periventricular white matter. Lateral ventricles and midline structures are unremarkable. No acute extra-axial fluid collections. No mass effect. Vascular: No hyperdense vessel or unexpected calcification. Skull: Normal. Negative for fracture or focal lesion. Sinuses/Orbits: Chronic sphenoid sinus mucosal thickening. No gas fluid levels. Other: None. IMPRESSION: 1. Stable head CT, no acute intracranial process. Electronically Signed   By: Randa Ngo M.D.   On: 10/08/2022 18:17   DG Knee 2 Views Right  Result Date: 10/08/2022 CLINICAL DATA:  fall pain EXAM: RIGHT KNEE - 2 VIEW COMPARISON:  None Available. FINDINGS: Osseous structures are osteopenic. No evidence of fracture, dislocation, or joint effusion. There is tricompartmental joint space narrowing, sclerosis and osteophytes consistent with degenerative joint disease. No evidence of effusion. IMPRESSION: Degenerative changes. Osteopenia. Electronically Signed   By: Sammie Bench M.D.   On: 10/08/2022 17:45   DG Hip Unilat With Pelvis 2-3 Views Right  Result Date:  10/08/2022 CLINICAL DATA:  Fall, pain EXAM: DG HIP (WITH OR WITHOUT PELVIS) 2-3V RIGHT COMPARISON:  None Available. FINDINGS: Right femoral neck subcapital fracture with varus angulation. Osseous structures are osteopenic. Pelvic ring appears intact PAC. Lumbosacral degenerative changes. IMPRESSION: Right femoral neck fracture. Electronically Signed   By: Sammie Bench M.D.   On: 10/08/2022 17:44   DG Chest 1 View  Result Date: 10/08/2022 CLINICAL DATA:  Fall, pain EXAM: CHEST  1 VIEW COMPARISON:  07/21/2018 FINDINGS: The heart size and mediastinal contours are within normal limits. Both lungs are clear. Aorta is calcified. Osseous structures are not well evaluated. IMPRESSION: No active disease. Electronically Signed   By: Sammie Bench M.D.   On: 10/08/2022 17:42    Orson Eva, DO  [facility]  If 7PM-7AM, please contact night-coverage [email or web address] Password [facility] 10/11/2022, 4:37 PM   LOS: 3 days

## 2022-10-11 NOTE — Progress Notes (Signed)
Last in and out done at 0630. Bladder scanned @ 0930 patient only showing 158cc in bladder.

## 2022-10-11 NOTE — Progress Notes (Signed)
Bladder scanned 238cc MD informed.

## 2022-10-11 NOTE — Progress Notes (Signed)
Pt restless beginning of night but after Trazodone and Morphine she was able to rest. Bladder scan performed due to no urinary output post LR bolus. Bladder scan showed 497cc. In and out catheterization performed with output of 650. SCD's in place. No other acute events overnight. Bryson Corona Edd Fabian

## 2022-10-11 NOTE — Progress Notes (Signed)
Patient ID: Tamara [name], female   DOB: 04/09/1934, 89 y.o.   MRN: [id number]  POD 2 rt bipolar   Hg 9.6  Seems to be having fever and resp issues see cxr   Hopefully will get some resp tx   Fwb   Fu 2 weeks from surgery

## 2022-10-11 NOTE — Progress Notes (Addendum)
LCSWA spoke to [place] @ [facility] to start Authorization and EMS.

## 2022-10-12 DIAGNOSIS — S72001A Fracture of unspecified part of neck of right femur, initial encounter for closed fracture: Secondary | ICD-10-CM | POA: Diagnosis not present

## 2022-10-12 DIAGNOSIS — N3091 Cystitis, unspecified with hematuria: Secondary | ICD-10-CM | POA: Diagnosis not present

## 2022-10-12 DIAGNOSIS — F0390 Unspecified dementia without behavioral disturbance: Secondary | ICD-10-CM | POA: Diagnosis not present

## 2022-10-12 LAB — BASIC METABOLIC PANEL
Anion gap: 7 (ref 5–15)
BUN: 17 mg/dL (ref 8–23)
CO2: 24 mmol/L (ref 22–32)
Calcium: 7.4 mg/dL — ABNORMAL LOW (ref 8.9–10.3)
Chloride: 105 mmol/L (ref 98–111)
Creatinine, Ser: 0.84 mg/dL (ref 0.44–1.00)
GFR, Estimated: >60 mL/min (ref >60)
Glucose, Bld: 101 mg/dL — ABNORMAL HIGH (ref 70–99)
Potassium: 3.8 mmol/L (ref 3.5–5.1)
Sodium: 136 mmol/L (ref 135–145)

## 2022-10-12 MED ORDER — CHLORHEXIDINE GLUCONATE CLOTH 2 % EX PADS
6.0000 | MEDICATED_PAD | Freq: Every day | CUTANEOUS | Status: DC
  Administered 2022-10-12 – 2022-10-14 (×3): 6 via TOPICAL

## 2022-10-12 NOTE — Progress Notes (Signed)
Assisted Zenaida Deed, RN with foley insertion. 16 fr. Pt tolerated well with good urine return. Will continue to monitor.

## 2022-10-12 NOTE — Progress Notes (Signed)
Pt has not had any urine output. Bladder scanned with >400 showing. MD notified. Will continue to monitor.

## 2022-10-12 NOTE — TOC Progression Note (Signed)
Transition of Care [facility]) - Progression Note    Patient Details  Name: Tamara [name] MRN: [id number] Date of Birth: Jun 11, 1934  [facility] CM/SW Contact  Boneta Lucks, RN Phone Number: 10/12/2022, 2:17 PM  Clinical Narrative:   Discussed bed offers with Helene Kelp, they want [facility]. CM called [facility] to update with bed choice. MD updated. TOC following.    Expected Discharge Plan: [place] Barriers to Discharge: Continued Medical Work up  Expected Discharge Plan and Services In-house Referral: Clinical Social Work Discharge Planning Services: CM Consult   Living arrangements for the past 2 months: Single Family Home                     Social Determinants of Health (SDOH) Interventions SDOH Screenings   Food Insecurity: No Food Insecurity (10/08/2022)  Housing: Low Risk  (10/08/2022)  Transportation Needs: No Transportation Needs (10/08/2022)  Utilities: Not At Risk (10/08/2022)  Tobacco Use: Medium Risk (10/09/2022)    Readmission Risk Interventions     No data to display

## 2022-10-12 NOTE — Progress Notes (Signed)
Pt required in and out catheterization overnight with output of 600cc. Pt has not been able to void on own. No other acute events. Joan Flores, RN

## 2022-10-12 NOTE — Progress Notes (Signed)
PROGRESS NOTE  Tamara [name] [id number] DOB: [birth year] DOA: 10/08/2022 PCP: [name], MD  Brief History:  89 year old female with a history of dementia, diabetes mellitus, hypertension, right breast cancer, and B12 deficiency presenting after an unwitnessed fall with right leg pain.  Apparently, the patient was sleeping some cereal that showed a spilled near the kitchen when she sustained a fall.  She subsequently crawled and made her way to a cellular phone and contacted her daughter.  Her daughter came home from work and found the patient laying on her right side awake and alert.  It was felt that patient likely tripped when she was sweeping up some cereal.  She did not hit her head.  Unfortunately, the patient is able to provide history secondary to her dementia.  EMS was activated and the patient was brought to emergency department. In the ED, the patient had low-grade temperature of 99.6.  She was hemodynamically stable with oxygen saturation 95% room air.  WBC 12.0, hemoglobin 12.2, platelets 197,000.  Sodium 136, potassium 3.5, bicarbonate 23, serum creatinine 0.89.  EKG shows sinus rhythm without concerning ST ST wave changes.  X-ray of the right pelvis showed a right femoral neck fracture.  Orthopedics, Dr. Aline Brochure was consulted.  At baseline, the patient ambulates independently without any assistance.  She was able to perform all her activities of daily living, but does need some assistance with bathing to prevent falls.  Prior to her injury, the patient had not complained of any new complaints.  There is no fevers, chills, chest pain, shortness breath, nausea, vomit, diarrhea, abdominal pain, hematochezia, melena Pt underwent Right hip bipolar arthroplasty on 10/09/22 by Dr. Aline Brochure.  She developed a postop fever up to 102.8 on the evening 102.8   Assessment/Plan:  Right femoral neck fracture -At baseline, patient ambulates without assistance -10/09/22--Right bipolar hip  arthroplasty-Dr. Aline Brochure -Judicious opioids -PT/OT>>SNF   Post-op Fever -10/09/22 evening--fever 102.8 -obtain blood cultures x 2--neg to date -UA/>50 WBC -CXR--personally reviewed--poor inspiration, bilateral patchy opacity -PCT 0.41>>0.29 -CT chest-scattered nonspecific peribronchial opacity. Atelectatic changes to the hilar airways. No overt lung consolidation. appearance is suspicious for Chronic Lung Disease with Developing Fibrosis.Superimposed viral/atypical Respiratory Infection is difficult to exclude. -currently stable on RA>>99-100%   Pyuria -UA>50 WBC -urine culture was not sent even though it was ordered -continue empiric ceftriaxone   Dementia without behavioral disturbance -Continue Aricept   Essential hypertension -Holding amlodipine and losartan temporarily -BP soft   Diabetes mellitus type 2, controlled -Not on any medications as an outpatient -3/21 hemoglobin A1c--5.9   Right breast cancer -Currently in remission -Status post lumpectomy and lymph node dissection August 2014 -Patient declined radiation therapy -s/p Aromasin October 2014, continued till October 2019.  -Follow-up Dr. Delton Coombes             Family Communication:  son updated 3/25   Consultants:  ortho--[place]   Code Status:  FULL    DVT Prophylaxis:  SCDs     Procedures: As Listed in Progress Note Above   Antibiotics: Ceftriaxone 3/23>>               Subjective: Patient denies fevers, chills, headache, chest pain, dyspnea, nausea, vomiting, diarrhea, abdominal pain, dysuria, hematuria, hematochezia, and melena.   Objective: Vitals:   10/11/22 1027 10/11/22 2001 10/12/22 0434 10/12/22 1552  BP: (!) 98/53 (!) 96/52 (!) 101/54 130/66  Pulse: 80 69 70 76  Resp: 17 16 16  18  Temp: 99.7 F (37.6 C) 98.2 F (36.8 C) 99.1 F (37.3 C)   TempSrc: Oral Oral Axillary   SpO2: 98% 100% 100% 100%  Weight:        Intake/Output Summary (Last 24 hours) at 10/12/2022  1818 Last data filed at 10/12/2022 0900 Gross per 24 hour  Intake 340 ml  Output 600 ml  Net -260 ml   Weight change:  Exam:  General:  Pt is alert, follows commands appropriately, not in acute distress HEENT: No icterus, No thrush, No neck mass, [place]/AT Cardiovascular: RRR, S1/S2, no rubs, no gallops Respiratory: fine bibasilar crackles.  No wheeze Abdomen: Soft/+BS, non tender, non distended, no guarding Extremities: No edema, No lymphangitis, No petechiae, No rashes, no synovitis   Data Reviewed: I have personally reviewed following labs and imaging studies Basic Metabolic Panel: Recent Labs  Lab 10/08/22 1612 10/09/22 0451 10/10/22 0926 10/11/22 1430 10/12/22 0432  NA 137 136 139 134* 136  K 4.3 3.5 3.7 3.6 3.8  CL 104 102 110 106 105  CO2 23 23 22  21* 24  GLUCOSE 120* 128* 110* 143* 101*  BUN 23 20 23 18 17   CREATININE 0.93 0.89 1.11* 0.83 0.84  CALCIUM 8.6* 8.5* 7.3* 7.3* 7.4*  MG  --   --   --  2.0  --    Liver Function Tests: Recent Labs  Lab 10/10/22 0926 10/11/22 1430  AST 43* 30  ALT 19 19  ALKPHOS 56 55  BILITOT 1.5* 0.9  PROT 5.6* 5.5*  ALBUMIN 2.8* 2.6*   No results for input(s): "LIPASE", "AMYLASE" in the last 168 hours. No results for input(s): "AMMONIA" in the last 168 hours. Coagulation Profile: Recent Labs  Lab 10/08/22 1612  INR 1.1   CBC: Recent Labs  Lab 10/08/22 1612 10/09/22 0451 10/10/22 0926 10/11/22 1430  WBC 12.0* 11.7* 9.4 8.1  NEUTROABS 10.3*  --   --   --   HGB 12.2 12.9 9.6* 9.4*  HCT 36.6 38.6 29.6* 28.7*  MCV 94.8 95.1 96.1 96.3  PLT 195 197 132* 123*   Cardiac Enzymes: No results for input(s): "CKTOTAL", "CKMB", "CKMBINDEX", "TROPONINI" in the last 168 hours. BNP: Invalid input(s): "POCBNP" CBG: No results for input(s): "GLUCAP" in the last 168 hours. HbA1C: No results for input(s): "HGBA1C" in the last 72 hours. Urine analysis:    Component Value Date/Time   COLORURINE AMBER (A) 10/10/2022 0900    APPEARANCEUR TURBID (A) 10/10/2022 0900   LABSPEC 1.023 10/10/2022 0900   PHURINE 5.0 10/10/2022 0900   GLUCOSEU NEGATIVE 10/10/2022 0900   HGBUR MODERATE (A) 10/10/2022 0900   BILIRUBINUR NEGATIVE 10/10/2022 0900   KETONESUR 20 (A) 10/10/2022 0900   PROTEINUR 100 (A) 10/10/2022 0900   NITRITE NEGATIVE 10/10/2022 0900   LEUKOCYTESUR MODERATE (A) 10/10/2022 0900   Sepsis Labs: @LABRCNTIP (procalcitonin:4,lacticidven:4) ) Recent Results (from the past 240 hour(s))  Culture, blood (Routine X 2) w Reflex to ID Panel     Status: None (Preliminary result)   Collection Time: 10/10/22  9:26 AM   Specimen: BLOOD  Result Value Ref Range Status   Specimen Description BLOOD BLOOD LEFT ARM  Final   Special Requests   Final    Blood Culture results may not be optimal due to an excessive volume of blood received in culture bottles   Culture   Final    NO GROWTH 2 DAYS Performed at [facility] [address] [place], [place] [address]    Report Status PENDING  Incomplete  Culture, blood (Routine X 2) w Reflex to ID Panel     Status: None (Preliminary result)   Collection Time: 10/10/22  9:26 AM   Specimen: BLOOD  Result Value Ref Range Status   Specimen Description BLOOD BLOOD RIGHT ARM  Final   Special Requests   Final    Blood Culture results may not be optimal due to an excessive volume of blood received in culture bottles   Culture   Final    NO GROWTH 2 DAYS Performed at [facility], [address] [place], [place] [address]    Report Status PENDING  Incomplete     Scheduled Meds:  donepezil  10 mg Oral QHS   Continuous Infusions:  cefTRIAXone (ROCEPHIN)  IV 1 g (10/12/22 1757)    Procedures/Studies: CT CHEST WO CONTRAST  Result Date: 10/12/2022 CLINICAL DATA:  89 year old female with respiratory illness. Cough. Nondiagnostic x-ray. EXAM: CT CHEST WITHOUT CONTRAST TECHNIQUE: Multidetector CT imaging of the chest was performed following the standard protocol without IV  contrast. RADIATION DOSE REDUCTION: This exam was performed according to the departmental dose-optimization program which includes automated exposure control, adjustment of the mA and/or kV according to patient size and/or use of iterative reconstruction technique. COMPARISON:  Portable chest 10/10/2022. previous CT Abdomen and Pelvis 11/05/2015. FINDINGS: Cardiovascular: Cardiac size is stable since 2017, upper limits of normal to borderline enlarged. No pericardial effusion. Calcified coronary artery atherosclerosis on series 2, image 70. Extensive Calcified aortic atherosclerosis. Mild tortuosity of the thoracic aorta. Vascular patency is not evaluated in the absence of IV contrast. Mediastinum/Nodes: Indistinct but probably reactive right paratracheal lymph nodes measuring up to 14 mm diameter on series 2, image 42. No discrete mediastinal mass. Lungs/Pleura: Small volume bilateral dependent pleural effusions. Lower lung volumes compared to 2017. Widespread and confluent subpleural reticular and streaky lung opacity. Mild superimposed respiratory motion. Atelectatic changes to the bilateral major airways. Trachea and carina remain patent. Scattered areas of patchy peribronchial opacity which are nonspecific. No primary area of lung consolidation. Upper Abdomen: Extensive cholelithiasis on series 2, image 138 is chronic. No obvious pericholecystic inflammation. Otherwise noncontrast liver, spleen, visible pancreas, adrenal glands and bowel appear negative. Chronic benign left renal upper pole cyst with simple fluid density (no follow-up imaging recommended). Right nephrolithiasis versus vascular calcifications. Musculoskeletal: Osteopenia. Chronic T9 and T10 compression fractures were present in 2017. Partially healed chronic mid sternal fracture series 7, image 102. Chronic rib fractures. No acute or suspicious osseous lesion identified. IMPRESSION: 1. Widespread subpleural lung opacity, with lower lung volumes  since 2017, trace pleural effusions, and scattered nonspecific peribronchial opacity. Atelectatic changes to the hilar airways. No overt lung consolidation. The appearance is suspicious for Chronic Lung Disease with Developing Fibrosis. Superimposed viral/atypical Respiratory Infection is difficult to exclude. And there are reactive appearing right paratracheal lymph nodes. 2. Chronic mild cardiomegaly is stable since 2017. Calcified coronary artery and Aortic Atherosclerosis (ICD10-I70.0). 3. Chronic cholelithiasis. Right nephrolithiasis versus vascular calcifications. Electronically Signed   By: Genevie Ann M.D.   On: 10/12/2022 05:02   DG CHEST PORT 1 VIEW  Result Date: 10/10/2022 CLINICAL DATA:  Table formatting from the original note was not included. Hypoxia EXAM: PORTABLE CHEST - 1 VIEW COMPARISON:  10/08/2022 FINDINGS: Worsening scattered interstitial and coarse airspace opacities throughout both lungs. Mild cardiomegaly.  Aortic Atherosclerosis (ICD10-170.0). Blunting of left lateral costophrenic angle. Visualized bones unremarkable. IMPRESSION: 1. Worsening scattered interstitial and airspace opacities. 2. Mild cardiomegaly. Electronically Signed   By: Lucrezia Europe  M.D.   On: 10/10/2022 09:49   DG Pelvis Portable  Result Date: 10/09/2022 CLINICAL DATA:  Postop EXAM: PORTABLE PELVIS 2 VIEWS COMPARISON:  None Available. FINDINGS: Patient is status post right total hip arthroplasty. Pelvic ring appears to be intact. The osseous structures are osteopenic. There is anatomic alignment. No acute fracture is seen. IMPRESSION: Postop right hip arthroplasty. Electronically Signed   By: Sammie Bench M.D.   On: 10/09/2022 16:33   CT Head Wo Contrast  Result Date: 10/08/2022 CLINICAL DATA:  [place] onto floor, dimension EXAM: CT HEAD WITHOUT CONTRAST TECHNIQUE: Contiguous axial images were obtained from the base of the skull through the vertex without intravenous contrast. RADIATION DOSE REDUCTION: This exam was  performed according to the departmental dose-optimization program which includes automated exposure control, adjustment of the mA and/or kV according to patient size and/or use of iterative reconstruction technique. COMPARISON:  05/16/2022 FINDINGS: Brain: No acute infarct or hemorrhage. Stable chronic small-vessel ischemic changes throughout the periventricular white matter. Lateral ventricles and midline structures are unremarkable. No acute extra-axial fluid collections. No mass effect. Vascular: No hyperdense vessel or unexpected calcification. Skull: Normal. Negative for fracture or focal lesion. Sinuses/Orbits: Chronic sphenoid sinus mucosal thickening. No gas fluid levels. Other: None. IMPRESSION: 1. Stable head CT, no acute intracranial process. Electronically Signed   By: Randa Ngo M.D.   On: 10/08/2022 18:17   DG Knee 2 Views Right  Result Date: 10/08/2022 CLINICAL DATA:  fall pain EXAM: RIGHT KNEE - 2 VIEW COMPARISON:  None Available. FINDINGS: Osseous structures are osteopenic. No evidence of fracture, dislocation, or joint effusion. There is tricompartmental joint space narrowing, sclerosis and osteophytes consistent with degenerative joint disease. No evidence of effusion. IMPRESSION: Degenerative changes. Osteopenia. Electronically Signed   By: Sammie Bench M.D.   On: 10/08/2022 17:45   DG Hip Unilat With Pelvis 2-3 Views Right  Result Date: 10/08/2022 CLINICAL DATA:  Fall, pain EXAM: DG HIP (WITH OR WITHOUT PELVIS) 2-3V RIGHT COMPARISON:  None Available. FINDINGS: Right femoral neck subcapital fracture with varus angulation. Osseous structures are osteopenic. Pelvic ring appears intact PAC. Lumbosacral degenerative changes. IMPRESSION: Right femoral neck fracture. Electronically Signed   By: Sammie Bench M.D.   On: 10/08/2022 17:44   DG Chest 1 View  Result Date: 10/08/2022 CLINICAL DATA:  Fall, pain EXAM: CHEST  1 VIEW COMPARISON:  07/21/2018 FINDINGS: The heart size and  mediastinal contours are within normal limits. Both lungs are clear. Aorta is calcified. Osseous structures are not well evaluated. IMPRESSION: No active disease. Electronically Signed   By: Sammie Bench M.D.   On: 10/08/2022 17:42    Orson Eva, DO  [facility]  If 7PM-7AM, please contact night-coverage [email or web address] Password [email or web address] 10/12/2022, 6:18 PM   LOS: 4 days

## 2022-10-12 NOTE — Progress Notes (Signed)
Physical Therapy Treatment Patient Details Name: Tamara [name] MRN: [id number] DOB: 1934-03-17 Today's Date: 10/12/2022   History of Present Illness Tamara [name] is a 89 y.o. female.  Level 5 caveat secondary to dementia.  Patient brought in by EMS from home after fall unwitnessed.  Patient does not recall the event.  She states she lives with her daughter.  Pt sustained a Rt hip fx and underwent Bipolar repair on 10/08/22.    PT Comments    Patient apprehensive to get up, but agreeable after encouragement.  Patient had difficulty sitting up at bedside requiring Max assist, once seated demonstrates fair carryover for completing BLE requiring tactile assistance for completing RLE marching in plance.  Patient very unsteady on feet and limited to a few side steps with mostly shuffling of RLE due to c/o increased pain.  Patient tolerated sitting up in chair after therapy - nursing staff notified.  Patient will benefit from continued skilled physical therapy in hospital and recommended venue below to increase strength, balance, endurance for safe ADLs and gait.    Recommendations for follow up therapy are one component of a multi-disciplinary discharge planning process, led by the attending physician.  Recommendations may be updated based on patient status, additional functional criteria and insurance authorization.  Follow Up Recommendations  Can patient physically be transported by private vehicle: No    Assistance Recommended at Discharge Intermittent Supervision/Assistance  Patient can return home with the following A lot of help with walking and/or transfers;A lot of help with bathing/dressing/bathroom;Assistance with cooking/housework   Equipment Recommendations  None recommended by PT    Recommendations for Other Services       Precautions / Restrictions Precautions Precautions: Fall Restrictions Weight Bearing Restrictions: Yes RLE Weight Bearing: Weight bearing as tolerated      Mobility  Bed Mobility Overal bed mobility: Needs Assistance Bed Mobility: Supine to Sit   Sidelying to sit: Max assist       General bed mobility comments: increased time, labored movement, poor tolerance for moving RLE due to increased pain    Transfers Overall transfer level: Needs assistance Equipment used: Rolling walker (2 wheels) Transfers: Sit to/from Stand, Bed to chair/wheelchair/BSC Sit to Stand: Mod assist Stand pivot transfers: Mod assist         General transfer comment: unsteady labored movement, very apprehensive due to fear of falling    Ambulation/Gait Ambulation/Gait assistance: Mod assist, Max assist Gait Distance (Feet): 4 Feet Assistive device: Rolling walker (2 wheels) Gait Pattern/deviations: Decreased step length - right, Decreased step length - left, Decreased stride length, Antalgic, Decreased stance time - right, Shuffle Gait velocity: slow     General Gait Details: limited to a few slow labored unsteady side steps with mostly shuffling of RLE due to hip pain   Stairs             Wheelchair Mobility    Modified Rankin (Stroke Patients Only)       Balance Overall balance assessment: Needs assistance Sitting-balance support: Feet supported, No upper extremity supported Sitting balance-Leahy Scale: Fair Sitting balance - Comments: fair/good seated at EOB   Standing balance support: During functional activity, Bilateral upper extremity supported Standing balance-Leahy Scale: Poor Standing balance comment: using RW                            Cognition Arousal/Alertness: Awake/alert Behavior During Therapy: WFL for tasks assessed/performed Overall Cognitive Status: History of cognitive impairments -  at baseline                                          Exercises General Exercises - Lower Extremity Long Arc Quad: Seated, AROM, Strengthening, Both, 10 reps Hip Flexion/Marching: Seated, AROM,  Strengthening, AAROM, Both, 10 reps Toe Raises: Seated, AROM, Strengthening, Both, 10 reps Heel Raises: Seated, AROM, Strengthening, Both, 10 reps    General Comments        Pertinent Vitals/Pain Pain Assessment Pain Assessment: Faces Faces Pain Scale: Hurts even more Pain Location: right hip with movement Pain Descriptors / Indicators: Sore, Discomfort, Grimacing, Guarding Pain Intervention(s): Limited activity within patient's tolerance, Monitored during session, Repositioned    Home Living                          Prior Function            PT Goals (current goals can now be found in the care plan section) Progress towards PT goals: Progressing toward goals    Frequency    Min 4X/week      PT Plan Current plan remains appropriate    Co-evaluation              AM-PAC PT "6 Clicks" Mobility   Outcome Measure  Help needed turning from your back to your side while in a flat bed without using bedrails?: A Lot Help needed moving from lying on your back to sitting on the side of a flat bed without using bedrails?: A Lot Help needed moving to and from a bed to a chair (including a wheelchair)?: A Lot Help needed standing up from a chair using your arms (e.g., wheelchair or bedside chair)?: A Lot Help needed to walk in hospital room?: A Lot Help needed climbing 3-5 steps with a railing? : Total 6 Click Score: 11    End of Session   Activity Tolerance: Patient tolerated treatment well;Patient limited by fatigue;Patient limited by pain Patient left: in chair;with call bell/phone within reach Nurse Communication: Mobility status PT Visit Diagnosis: Unsteadiness on feet (R26.81);Muscle weakness (generalized) (M62.81);Difficulty in walking, not elsewhere classified (R26.2)     Time: [id number] PT Time Calculation (min) (ACUTE ONLY): 20 min  Charges:  $Therapeutic Exercise: 8-22 mins $Therapeutic Activity: 8-22 mins                     2:25 PM,  10/12/22 Lonell Grandchild, MPT Physical Therapist with [facility] [phone] office [phone] mobile phone

## 2022-10-13 DIAGNOSIS — I10 Essential (primary) hypertension: Secondary | ICD-10-CM | POA: Diagnosis not present

## 2022-10-13 DIAGNOSIS — N3091 Cystitis, unspecified with hematuria: Secondary | ICD-10-CM | POA: Diagnosis not present

## 2022-10-13 DIAGNOSIS — S72001A Fracture of unspecified part of neck of right femur, initial encounter for closed fracture: Secondary | ICD-10-CM | POA: Diagnosis not present

## 2022-10-13 LAB — URINALYSIS, COMPLETE (UACMP) WITH MICROSCOPIC
Bilirubin Urine: NEGATIVE
Glucose, UA: NEGATIVE mg/dL
Ketones, ur: NEGATIVE mg/dL
Nitrite: NEGATIVE
Protein, ur: 30 mg/dL — AB
Specific Gravity, Urine: 1.013 (ref 1.005–1.030)
pH: 5 (ref 5.0–8.0)

## 2022-10-13 LAB — SARS CORONAVIRUS 2 BY RT PCR: SARS Coronavirus 2 by RT PCR: NEGATIVE

## 2022-10-13 MED ORDER — FOSFOMYCIN TROMETHAMINE 3 G PO PACK
3.0000 g | PACK | Freq: Once | ORAL | Status: AC
  Administered 2022-10-14: 3 g via ORAL
  Filled 2022-10-13: qty 3

## 2022-10-13 NOTE — Progress Notes (Signed)
PROGRESS NOTE  Tamara [name] [id number] DOB: 09-23-1933 DOA: 10/08/2022 PCP: [name], MD  Brief History:  89 year old female with a history of dementia, diabetes mellitus, hypertension, right breast cancer, and B12 deficiency presenting after an unwitnessed fall with right leg pain.  Apparently, the patient was sleeping some cereal that showed a spilled near the kitchen when she sustained a fall.  She subsequently crawled and made her way to a cellular phone and contacted her daughter.  Her daughter came home from work and found the patient laying on her right side awake and alert.  It was felt that patient likely tripped when she was sweeping up some cereal.  She did not hit her head.  Unfortunately, the patient is able to provide history secondary to her dementia.  EMS was activated and the patient was brought to emergency department. In the ED, the patient had low-grade temperature of 99.6.  She was hemodynamically stable with oxygen saturation 95% room air.  WBC 12.0, hemoglobin 12.2, platelets 197,000.  Sodium 136, potassium 3.5, bicarbonate 23, serum creatinine 0.89.  EKG shows sinus rhythm without concerning ST ST wave changes.  X-ray of the right pelvis showed a right femoral neck fracture.  Orthopedics, Dr. Aline Brochure was consulted.  At baseline, the patient ambulates independently without any assistance.  She was able to perform all her activities of daily living, but does need some assistance with bathing to prevent falls.  Prior to her injury, the patient had not complained of any new complaints.  There is no fevers, chills, chest pain, shortness breath, nausea, vomit, diarrhea, abdominal pain, hematochezia, melena Pt underwent Right hip bipolar arthroplasty on 10/09/22 by Dr. Aline Brochure.  She developed a postop fever up to 102.8 on the evening 102.8   Assessment/Plan:  Right femoral neck fracture -At baseline, patient ambulates without assistance -10/09/22--Right bipolar hip  arthroplasty-Dr. Aline Brochure -Judicious opioids -PT/OT>>SNF   Post-op Fever--resolved -10/09/22 evening--fever 102.8 -obtain blood cultures x 2--neg to date -3/23 UA/>50 WBC -CXR--personally reviewed--poor inspiration, bilateral patchy opacity -PCT 0.41>>0.29 -CT chest-scattered nonspecific peribronchial opacity. Atelectatic changes to the hilar airways. No overt lung consolidation. appearance is suspicious for Chronic Lung Disease with Developing Fibrosis.Superimposed viral/atypical Respiratory Infection is difficult to exclude. -currently stable on RA>>99-100%   Pyuria -UA>50 WBC -urine culture was not sent even though it was ordered -continue empiric ceftriaxone>>fosfomycin to finish course   Dementia without behavioral disturbance -Continue Aricept   Essential hypertension -Holding amlodipine and losartan temporarily -BP soft>>improved, remains well controlled off anti-HTN meds   Diabetes mellitus type 2, controlled -Not on any medications as an outpatient -3/21 hemoglobin A1c--5.9   Right breast cancer -Currently in remission -Status post lumpectomy and lymph node dissection August 2014 -Patient declined radiation therapy -s/p Aromasin October 2014, continued till October 2019.  -Follow-up Dr. Dereck Ligas Delirium -having waxing and waning symptoms -opioids/UTI also contributing         Family Communication:  son updated 3/26   Consultants:  ortho--[place]   Code Status:  FULL    DVT Prophylaxis:  SCDs     Procedures: As Listed in Progress Note Above   Antibiotics: Ceftriaxone 3/23>>3/26 -fosfomycin 3/27 x 1                Subjective: Patient denies fevers, chills, headache, chest pain, dyspnea, nausea, vomiting, diarrhea, abdominal pain, dysuria, hematuria, hematochezia, and melena.   Objective: Vitals:   10/12/22 1552 10/12/22 [id number] 10/13/22 [id number] 10/13/22  1259  BP: 130/66 109/60 117/63 112/60  Pulse: 76 72 73 73  Resp: 18 18  16 17   Temp:  98.8 F (37.1 C) 98.3 F (36.8 C)   TempSrc:  Oral Oral   SpO2: 100% 98% 96% 99%  Weight:        Intake/Output Summary (Last 24 hours) at 10/13/2022 1717 Last data filed at 10/13/2022 0900 Gross per 24 hour  Intake 480 ml  Output 750 ml  Net -270 ml   Weight change:  Exam:  General:  Pt is alert, follows commands appropriately, not in acute distress HEENT: No icterus, No thrush, No neck mass, [place]/AT Cardiovascular: RRR, S1/S2, no rubs, no gallops Respiratory: bibasilar  crackles. No wheeze Abdomen: Soft/+BS, non tender, non distended, no guarding Extremities: No edema, No lymphangitis, No petechiae, No rashes, no synovitis   Data Reviewed: I have personally reviewed following labs and imaging studies Basic Metabolic Panel: Recent Labs  Lab 10/08/22 1612 10/09/22 0451 10/10/22 0926 10/11/22 1430 10/12/22 0432  NA 137 136 139 134* 136  K 4.3 3.5 3.7 3.6 3.8  CL 104 102 110 106 105  CO2 23 23 22  21* 24  GLUCOSE 120* 128* 110* 143* 101*  BUN 23 20 23 18 17   CREATININE 0.93 0.89 1.11* 0.83 0.84  CALCIUM 8.6* 8.5* 7.3* 7.3* 7.4*  MG  --   --   --  2.0  --    Liver Function Tests: Recent Labs  Lab 10/10/22 0926 10/11/22 1430  AST 43* 30  ALT 19 19  ALKPHOS 56 55  BILITOT 1.5* 0.9  PROT 5.6* 5.5*  ALBUMIN 2.8* 2.6*   No results for input(s): "LIPASE", "AMYLASE" in the last 168 hours. No results for input(s): "AMMONIA" in the last 168 hours. Coagulation Profile: Recent Labs  Lab 10/08/22 1612  INR 1.1   CBC: Recent Labs  Lab 10/08/22 1612 10/09/22 0451 10/10/22 0926 10/11/22 1430  WBC 12.0* 11.7* 9.4 8.1  NEUTROABS 10.3*  --   --   --   HGB 12.2 12.9 9.6* 9.4*  HCT 36.6 38.6 29.6* 28.7*  MCV 94.8 95.1 96.1 96.3  PLT 195 197 132* 123*   Cardiac Enzymes: No results for input(s): "CKTOTAL", "CKMB", "CKMBINDEX", "TROPONINI" in the last 168 hours. BNP: Invalid input(s): "POCBNP" CBG: No results for input(s): "GLUCAP" in the last 168  hours. HbA1C: No results for input(s): "HGBA1C" in the last 72 hours. Urine analysis:    Component Value Date/Time   COLORURINE AMBER (A) 10/10/2022 0900   APPEARANCEUR TURBID (A) 10/10/2022 0900   LABSPEC 1.023 10/10/2022 0900   PHURINE 5.0 10/10/2022 0900   GLUCOSEU NEGATIVE 10/10/2022 0900   HGBUR MODERATE (A) 10/10/2022 0900   BILIRUBINUR NEGATIVE 10/10/2022 0900   KETONESUR 20 (A) 10/10/2022 0900   PROTEINUR 100 (A) 10/10/2022 0900   NITRITE NEGATIVE 10/10/2022 0900   LEUKOCYTESUR MODERATE (A) 10/10/2022 0900   Sepsis Labs: @LABRCNTIP (procalcitonin:4,lacticidven:4) ) Recent Results (from the past 240 hour(s))  Culture, blood (Routine X 2) w Reflex to ID Panel     Status: None (Preliminary result)   Collection Time: 10/10/22  9:26 AM   Specimen: BLOOD  Result Value Ref Range Status   Specimen Description BLOOD BLOOD LEFT ARM  Final   Special Requests   Final    Blood Culture results may not be optimal due to an excessive volume of blood received in culture bottles   Culture   Final    NO GROWTH 3 DAYS Performed at [facility]  [facility], [address], [place], [place] [address]    Report Status PENDING  Incomplete  Culture, blood (Routine X 2) w Reflex to ID Panel     Status: None (Preliminary result)   Collection Time: 10/10/22  9:26 AM   Specimen: BLOOD  Result Value Ref Range Status   Specimen Description BLOOD BLOOD RIGHT ARM  Final   Special Requests   Final    Blood Culture results may not be optimal due to an excessive volume of blood received in culture bottles   Culture   Final    NO GROWTH 3 DAYS Performed at [facility], [address] [place], [place] [address]    Report Status PENDING  Incomplete  SARS Coronavirus 2 by RT PCR (hospital order, performed in [place] hospital lab) *cepheid single result test* Anterior Nasal Swab     Status: None   Collection Time: 10/13/22 11:06 AM   Specimen: Anterior Nasal Swab  Result Value Ref Range Status   SARS  Coronavirus 2 by RT PCR NEGATIVE NEGATIVE Final    Comment: (NOTE) SARS-CoV-2 target nucleic acids are NOT DETECTED.  The SARS-CoV-2 RNA is generally detectable in upper and lower respiratory specimens during the acute phase of infection. The lowest concentration of SARS-CoV-2 viral copies this assay can detect is 250 copies / mL. A negative result does not preclude SARS-CoV-2 infection and should not be used as the sole basis for treatment or other patient management decisions.  A negative result may occur with improper specimen collection / handling, submission of specimen other than nasopharyngeal swab, presence of viral mutation(s) within the areas targeted by this assay, and inadequate number of viral copies (<250 copies / mL). A negative result must be combined with clinical observations, patient history, and epidemiological information.  Fact Sheet for Patients:   https://www.patel.info/  Fact Sheet for Healthcare Providers: https://hall.com/  This test is not yet approved or  cleared by the [place] FDA and has been authorized for detection and/or diagnosis of SARS-CoV-2 by FDA under an Emergency Use Authorization (EUA).  This EUA will remain in effect (meaning this test can be used) for the duration of the COVID-19 declaration under Section 564(b)(1) of the Act, 21 U.S.C. section 360bbb-3(b)(1), unless the authorization is terminated or revoked sooner.  Performed at [facility], [address], [place], [place] [address]      Scheduled Meds:  Chlorhexidine Gluconate Cloth  6 each Topical Daily   donepezil  10 mg Oral QHS   Continuous Infusions:  cefTRIAXone (ROCEPHIN)  IV 1 g (10/12/22 1757)    Procedures/Studies: CT CHEST WO CONTRAST  Result Date: 10/12/2022 CLINICAL DATA:  89 year old female with respiratory illness. Cough. Nondiagnostic x-ray. EXAM: CT CHEST WITHOUT CONTRAST TECHNIQUE: Multidetector CT imaging of  the chest was performed following the standard protocol without IV contrast. RADIATION DOSE REDUCTION: This exam was performed according to the departmental dose-optimization program which includes automated exposure control, adjustment of the mA and/or kV according to patient size and/or use of iterative reconstruction technique. COMPARISON:  Portable chest 10/10/2022. previous CT Abdomen and Pelvis 11/05/2015. FINDINGS: Cardiovascular: Cardiac size is stable since 2017, upper limits of normal to borderline enlarged. No pericardial effusion. Calcified coronary artery atherosclerosis on series 2, image 70. Extensive Calcified aortic atherosclerosis. Mild tortuosity of the thoracic aorta. Vascular patency is not evaluated in the absence of IV contrast. Mediastinum/Nodes: Indistinct but probably reactive right paratracheal lymph nodes measuring up to 14 mm diameter on series 2, image 42.  No discrete mediastinal mass. Lungs/Pleura: Small volume bilateral dependent pleural effusions. Lower lung volumes compared to 2017. Widespread and confluent subpleural reticular and streaky lung opacity. Mild superimposed respiratory motion. Atelectatic changes to the bilateral major airways. Trachea and carina remain patent. Scattered areas of patchy peribronchial opacity which are nonspecific. No primary area of lung consolidation. Upper Abdomen: Extensive cholelithiasis on series 2, image 138 is chronic. No obvious pericholecystic inflammation. Otherwise noncontrast liver, spleen, visible pancreas, adrenal glands and bowel appear negative. Chronic benign left renal upper pole cyst with simple fluid density (no follow-up imaging recommended). Right nephrolithiasis versus vascular calcifications. Musculoskeletal: Osteopenia. Chronic T9 and T10 compression fractures were present in 2017. Partially healed chronic mid sternal fracture series 7, image 102. Chronic rib fractures. No acute or suspicious osseous lesion identified.  IMPRESSION: 1. Widespread subpleural lung opacity, with lower lung volumes since 2017, trace pleural effusions, and scattered nonspecific peribronchial opacity. Atelectatic changes to the hilar airways. No overt lung consolidation. The appearance is suspicious for Chronic Lung Disease with Developing Fibrosis. Superimposed viral/atypical Respiratory Infection is difficult to exclude. And there are reactive appearing right paratracheal lymph nodes. 2. Chronic mild cardiomegaly is stable since 2017. Calcified coronary artery and Aortic Atherosclerosis (ICD10-I70.0). 3. Chronic cholelithiasis. Right nephrolithiasis versus vascular calcifications. Electronically Signed   By: Genevie Ann M.D.   On: 10/12/2022 05:02   DG CHEST PORT 1 VIEW  Result Date: 10/10/2022 CLINICAL DATA:  Table formatting from the original note was not included. Hypoxia EXAM: PORTABLE CHEST - 1 VIEW COMPARISON:  10/08/2022 FINDINGS: Worsening scattered interstitial and coarse airspace opacities throughout both lungs. Mild cardiomegaly.  Aortic Atherosclerosis (ICD10-170.0). Blunting of left lateral costophrenic angle. Visualized bones unremarkable. IMPRESSION: 1. Worsening scattered interstitial and airspace opacities. 2. Mild cardiomegaly. Electronically Signed   By: Lucrezia Europe M.D.   On: 10/10/2022 09:49   DG Pelvis Portable  Result Date: 10/09/2022 CLINICAL DATA:  Postop EXAM: PORTABLE PELVIS 2 VIEWS COMPARISON:  None Available. FINDINGS: Patient is status post right total hip arthroplasty. Pelvic ring appears to be intact. The osseous structures are osteopenic. There is anatomic alignment. No acute fracture is seen. IMPRESSION: Postop right hip arthroplasty. Electronically Signed   By: Sammie Bench M.D.   On: 10/09/2022 16:33   CT Head Wo Contrast  Result Date: 10/08/2022 CLINICAL DATA:  [place] onto floor, dimension EXAM: CT HEAD WITHOUT CONTRAST TECHNIQUE: Contiguous axial images were obtained from the base of the skull through the  vertex without intravenous contrast. RADIATION DOSE REDUCTION: This exam was performed according to the departmental dose-optimization program which includes automated exposure control, adjustment of the mA and/or kV according to patient size and/or use of iterative reconstruction technique. COMPARISON:  05/16/2022 FINDINGS: Brain: No acute infarct or hemorrhage. Stable chronic small-vessel ischemic changes throughout the periventricular white matter. Lateral ventricles and midline structures are unremarkable. No acute extra-axial fluid collections. No mass effect. Vascular: No hyperdense vessel or unexpected calcification. Skull: Normal. Negative for fracture or focal lesion. Sinuses/Orbits: Chronic sphenoid sinus mucosal thickening. No gas fluid levels. Other: None. IMPRESSION: 1. Stable head CT, no acute intracranial process. Electronically Signed   By: Randa Ngo M.D.   On: 10/08/2022 18:17   DG Knee 2 Views Right  Result Date: 10/08/2022 CLINICAL DATA:  fall pain EXAM: RIGHT KNEE - 2 VIEW COMPARISON:  None Available. FINDINGS: Osseous structures are osteopenic. No evidence of fracture, dislocation, or joint effusion. There is tricompartmental joint space narrowing, sclerosis and osteophytes consistent with degenerative joint disease. No  evidence of effusion. IMPRESSION: Degenerative changes. Osteopenia. Electronically Signed   By: Sammie Bench M.D.   On: 10/08/2022 17:45   DG Hip Unilat With Pelvis 2-3 Views Right  Result Date: 10/08/2022 CLINICAL DATA:  Fall, pain EXAM: DG HIP (WITH OR WITHOUT PELVIS) 2-3V RIGHT COMPARISON:  None Available. FINDINGS: Right femoral neck subcapital fracture with varus angulation. Osseous structures are osteopenic. Pelvic ring appears intact PAC. Lumbosacral degenerative changes. IMPRESSION: Right femoral neck fracture. Electronically Signed   By: Sammie Bench M.D.   On: 10/08/2022 17:44   DG Chest 1 View  Result Date: 10/08/2022 CLINICAL DATA:  Fall, pain  EXAM: CHEST  1 VIEW COMPARISON:  07/21/2018 FINDINGS: The heart size and mediastinal contours are within normal limits. Both lungs are clear. Aorta is calcified. Osseous structures are not well evaluated. IMPRESSION: No active disease. Electronically Signed   By: Sammie Bench M.D.   On: 10/08/2022 17:42    Orson Eva, DO  [facility]  If 7PM-7AM, please contact night-coverage [email or web address] Password [facility] 10/13/2022, 5:17 PM   LOS: 5 days

## 2022-10-13 NOTE — Care Management Important Message (Signed)
Important Message  Patient Details  Name: Tamara [name] MRN: [id number] Date of Birth: [birth year]   Medicare Important Message Given:  Yes     Tommy Medal 10/13/2022, 10:46 AM

## 2022-10-13 NOTE — Progress Notes (Signed)
Mobility Specialist Progress Note:    10/13/22 0915  Mobility  Activity Transferred to/from [facility]  Level of Assistance +2 (takes two people)  Information systems manager Ambulated (ft) 3 ft  RLE Weight Bearing WBAT  Activity Response Tolerated well  Mobility Referral Yes  $Mobility charge 1 Mobility   Pt agreeable to mobility session, needed max encouragement. Tolerated well, c/o hip pain throughout. Required ModA +2 to transfer B>C. Left pt in chair, alarm on, all needs met, call bell in reach.   Royetta Crochet Mobility Specialist Please contact via Solicitor or  Rehab office at [phone]

## 2022-10-13 NOTE — TOC Progression Note (Signed)
Transition of Care [facility]) - Progression Note    Patient Details  Name: Tamara [name] MRN: [id number] Date of Birth: 28-Oct-1933  Transition of Care [facility] CM/SW Contact  Boneta Lucks, RN Phone Number: 10/13/2022, 1:20 PM  Clinical Narrative:   CM called HTA for authorization updated. Patient COVID test is negative, she is ready for discharge to [facility].    Expected Discharge Plan: Skilled Nursing Facility Barriers to Discharge: Continued Medical Work up  Expected Discharge Plan and Services In-house Referral: Clinical Social Work Discharge Planning Services: CM Consult   Living arrangements for the past 2 months: [place]

## 2022-10-14 ENCOUNTER — Non-Acute Institutional Stay (SKILLED_NURSING_FACILITY): Payer: PPO | Admitting: Adult Health

## 2022-10-14 ENCOUNTER — Encounter: Payer: Self-pay | Admitting: Adult Health

## 2022-10-14 ENCOUNTER — Telehealth: Payer: Self-pay | Admitting: Radiology

## 2022-10-14 DIAGNOSIS — E118 Type 2 diabetes mellitus with unspecified complications: Secondary | ICD-10-CM

## 2022-10-14 DIAGNOSIS — S72001D Fracture of unspecified part of neck of right femur, subsequent encounter for closed fracture with routine healing: Secondary | ICD-10-CM | POA: Diagnosis not present

## 2022-10-14 DIAGNOSIS — E1159 Type 2 diabetes mellitus with other circulatory complications: Secondary | ICD-10-CM

## 2022-10-14 DIAGNOSIS — S72001A Fracture of unspecified part of neck of right femur, initial encounter for closed fracture: Secondary | ICD-10-CM | POA: Diagnosis not present

## 2022-10-14 DIAGNOSIS — Z471 Aftercare following joint replacement surgery: Secondary | ICD-10-CM | POA: Diagnosis not present

## 2022-10-14 DIAGNOSIS — R7303 Prediabetes: Secondary | ICD-10-CM | POA: Diagnosis not present

## 2022-10-14 DIAGNOSIS — I700 Atherosclerosis of aorta: Secondary | ICD-10-CM

## 2022-10-14 DIAGNOSIS — E538 Deficiency of other specified B group vitamins: Secondary | ICD-10-CM | POA: Diagnosis not present

## 2022-10-14 DIAGNOSIS — Z96642 Presence of left artificial hip joint: Secondary | ICD-10-CM | POA: Diagnosis not present

## 2022-10-14 DIAGNOSIS — E611 Iron deficiency: Secondary | ICD-10-CM | POA: Diagnosis not present

## 2022-10-14 DIAGNOSIS — D62 Acute posthemorrhagic anemia: Secondary | ICD-10-CM | POA: Diagnosis not present

## 2022-10-14 DIAGNOSIS — F0390 Unspecified dementia without behavioral disturbance: Secondary | ICD-10-CM | POA: Diagnosis not present

## 2022-10-14 DIAGNOSIS — J8410 Pulmonary fibrosis, unspecified: Secondary | ICD-10-CM

## 2022-10-14 DIAGNOSIS — M6281 Muscle weakness (generalized): Secondary | ICD-10-CM | POA: Diagnosis not present

## 2022-10-14 DIAGNOSIS — R2681 Unsteadiness on feet: Secondary | ICD-10-CM | POA: Diagnosis not present

## 2022-10-14 DIAGNOSIS — I152 Hypertension secondary to endocrine disorders: Secondary | ICD-10-CM

## 2022-10-14 DIAGNOSIS — R488 Other symbolic dysfunctions: Secondary | ICD-10-CM | POA: Diagnosis not present

## 2022-10-14 DIAGNOSIS — N3091 Cystitis, unspecified with hematuria: Secondary | ICD-10-CM | POA: Diagnosis not present

## 2022-10-14 DIAGNOSIS — S72001S Fracture of unspecified part of neck of right femur, sequela: Secondary | ICD-10-CM | POA: Diagnosis not present

## 2022-10-14 DIAGNOSIS — D509 Iron deficiency anemia, unspecified: Secondary | ICD-10-CM

## 2022-10-14 DIAGNOSIS — N3001 Acute cystitis with hematuria: Secondary | ICD-10-CM | POA: Diagnosis not present

## 2022-10-14 DIAGNOSIS — R262 Difficulty in walking, not elsewhere classified: Secondary | ICD-10-CM | POA: Diagnosis not present

## 2022-10-14 DIAGNOSIS — Z9181 History of falling: Secondary | ICD-10-CM | POA: Diagnosis not present

## 2022-10-14 LAB — BASIC METABOLIC PANEL
Anion gap: 6 (ref 5–15)
BUN: 14 mg/dL (ref 8–23)
CO2: 25 mmol/L (ref 22–32)
Calcium: 7.7 mg/dL — ABNORMAL LOW (ref 8.9–10.3)
Chloride: 105 mmol/L (ref 98–111)
Creatinine, Ser: 0.69 mg/dL (ref 0.44–1.00)
GFR, Estimated: >60 mL/min (ref >60)
Glucose, Bld: 105 mg/dL — ABNORMAL HIGH (ref 70–99)
Potassium: 3.5 mmol/L (ref 3.5–5.1)
Sodium: 136 mmol/L (ref 135–145)

## 2022-10-14 LAB — VITAMIN B12: Vitamin B-12: 105 pg/mL — ABNORMAL LOW (ref 180–914)

## 2022-10-14 LAB — URINE CULTURE: Culture: NO GROWTH

## 2022-10-14 LAB — TSH: TSH: 2.064 u[IU]/mL (ref 0.350–4.500)

## 2022-10-14 LAB — T4, FREE: Free T4: 1.35 ng/dL — ABNORMAL HIGH (ref 0.61–1.12)

## 2022-10-14 LAB — FOLATE: Folate: 15.8 ng/mL (ref >5.9)

## 2022-10-14 MED ORDER — ACETAMINOPHEN 325 MG PO TABS: 650.0000 mg | ORAL_TABLET | Freq: Four times a day (QID) | ORAL | Status: DC | PRN

## 2022-10-14 MED ORDER — HYDROCODONE-ACETAMINOPHEN 5-325 MG PO TABS: 1.0000 | ORAL_TABLET | Freq: Four times a day (QID) | ORAL | 0 refills | Status: DC | PRN

## 2022-10-14 NOTE — TOC Transition Note (Signed)
Transition of Care [facility]) - CM/SW Discharge Note   Patient Details  Name: TALOR WANDLING MRN: [id number] Date of Birth: Mar 19, 1934  Transition [facility] CM/SW Contact:  Shade Flood, LCSW Phone Number: 10/14/2022, 9:54 AM   Clinical Narrative:     Pt stable for dc to SNF today. She has auth. Kerri at [facility] says pt can admit there today. Updated family. RN to call report.   No other TOC needs for dc.  Final next level of care: Skilled Nursing Facility Barriers to Discharge: Continued Medical Work up   Patient Goals and CMS Choice CMS Medicare.gov Compare Post Acute Care list provided to:: Patient Represenative (must comment) Choice offered to / list presented to : Adult Children, Patient  Discharge Placement                Patient [facility] bed at: [facility] Patient to be transferred to facility by: w/c Name of family member notified: Clarene Critchley Patient and family notified of of transfer: 10/14/22  Discharge Plan and Services Additional resources added to the After Visit Summary for   In-house Referral: Clinical Social Work Discharge Planning Services: CM Consult                                 Social Determinants of Health (SDOH) Interventions SDOH Screenings   Food Insecurity: No Food Insecurity (10/08/2022)  Housing: Low Risk  (10/08/2022)  Transportation Needs: No Transportation Needs (10/08/2022)  Utilities: Not At Risk (10/08/2022)  Tobacco Use: Medium Risk (10/09/2022)     Readmission Risk Interventions     No data to display

## 2022-10-14 NOTE — Telephone Encounter (Signed)
-----   Message from Candice Camp, RT sent at 10/14/2022  2:44 PM EDT ----- Regarding: RE: phone call Tamara [name] is the patient, she wants to know if she has appointment and I told her no, but you can call her back to make one, please  ----- Message ----- From: Bo Mcclintock, CMA Sent: 10/14/2022   1:46 PM EDT To: Candice Camp, RT Subject: phone call                                     Tammy Ward from [facility] left message needing to speak to someone about a Dr. Aline Brochure patient. She did not leave a name or DOB of patient.  Please call [phone].

## 2022-10-14 NOTE — Discharge Summary (Signed)
Physician Discharge Summary   Patient: Tamara [name] MRN: [id number] DOB: May 03, 1934  Admit date:     10/08/2022  Discharge date: 10/14/22  Discharge Physician: Patrecia Pour   PCP: [name], MD   Recommendations at discharge:  Follow up with PCP in 1-2 weeks after SNF discharge Suggest monitoring CBC, BMP in the next week Recommend voiding trial in next few days, discharged with foley inserted 3/25 for acute urinary retention in setting of UTI (completing treatment on 3/27).  Discharge Diagnoses: Principal Problem:   Closed displaced fracture of right femoral neck ([place]) Active Problems:   Fibrosis lung (HCC)   Controlled diabetes mellitus type II without complication (HCC)   Essential (primary) hypertension   Dementia without behavioral disturbance ([place])   Fall at home, initial encounter   Postoperative fever   Cystitis with hematuria  Hospital Course: 89 year old female with a history of dementia, diabetes mellitus, hypertension, right breast cancer, and B12 deficiency presenting after an unwitnessed fall with right leg pain.  Apparently, the patient was sleeping some cereal that showed a spilled near the kitchen when she sustained a fall.  She subsequently crawled and made her way to a cellular phone and contacted her daughter.  Her daughter came home from work and found the patient laying on her right side awake and alert.  It was felt that patient likely tripped when she was sweeping up some cereal.  She did not hit her head.  Unfortunately, the patient is able to provide history secondary to her dementia.  EMS was activated and the patient was brought to emergency department. In the ED, the patient had low-grade temperature of 99.6.  She was hemodynamically stable with oxygen saturation 95% room air.  WBC 12.0, hemoglobin 12.2, platelets 197,000.  Sodium 136, potassium 3.5, bicarbonate 23, serum creatinine 0.89.  EKG shows sinus rhythm without concerning ST ST wave changes.  X-ray  of the right pelvis showed a right femoral neck fracture.  Orthopedics, Dr. Aline Brochure was consulted.  At baseline, the patient ambulates independently without any assistance.  She was able to perform all her activities of daily living, but does need some assistance with bathing to prevent falls.  Prior to her injury, the patient had not complained of any new complaints.  There is no fevers, chills, chest pain, shortness breath, nausea, vomit, diarrhea, abdominal pain, hematochezia, melena Pt underwent Right hip bipolar arthroplasty on 10/09/22 by Dr. Aline Brochure.  She developed a postop fever up to 102.8 on the evening 102.8, found to have UTI treated with ceftriaxone and completing treatment with fosfomycin on 3/27. Remains afebrile without leukocytosis since treatment initiation.   Assessment and Plan: * Closed displaced fracture of right femoral neck (HCC) -At baseline, patient ambulates without assistance -10/09/22--Right bipolar hip arthroplasty-Dr. Aline Brochure -Judicious opioids w/norco (prescribed) can also give tylenol prn mild pain but do not give more than 4g TDD.  -PT/OT>>SNF - [facility], f/u in 2 weeks outpatient.  Postoperative fever due to UTI: Completing treatment after 1 day of ancef, 4 days of ceftriaxone, and giving fosfomycin 3/27. Fever durably resolved.  Fibrosis lung: On room air.  No dyspnea.  Chest x-ray clear.  Fall at home: Unwitnessed fall.  Sustaining right femoral neck fracture. Formerly independent  Head CT, right knee x-ray negative for acute abnormality. - PT/OT at SNF  Dementia with acute hospital delirium:  - Continue aricept and delirium precautions.   Essential (primary) hypertension - Holding norvasc, losartan for now, remains normotensive. Consider restart based on BP  trends.   Controlled diabetes mellitus type II without complication: 0000000 [id number].  Right breast CA: s/p lumpectomy, LND Aug 2014, completed aromasin Oct 2019, declined radiation.  - F/u with Dr.  Delton Coombes per routine.      Pain control - [employer] Controlled Substance Reporting System database was reviewed. and patient was instructed, not to drive, operate heavy machinery, perform activities at heights, swimming or participation in water activities or provide baby-sitting services while on Pain, Sleep and Anxiety Medications; until their outpatient Physician has advised to do so again. Also recommended to not to take more than prescribed Pain, Sleep and Anxiety Medications.   Consultants: Orthopedics Procedures performed: R bipolar hip arthroplasty 3/22, Dr. Aline Brochure Disposition: Skilled nursing facility Diet recommendation: Regular DISCHARGE MEDICATION: Allergies as of 10/14/2022   No Known Allergies      Medication List     STOP taking these medications    amLODipine 5 MG tablet Commonly known as: NORVASC   losartan 25 MG tablet Commonly known as: COZAAR   meclizine 50 MG tablet Commonly known as: ANTIVERT       TAKE these medications    acetaminophen 325 MG tablet Commonly known as: TYLENOL Take 2 tablets (650 mg total) by mouth every 6 (six) hours as needed for mild pain.   BD Disp Needles 27G X 1/2" Misc Generic drug: NEEDLE (DISP) 27 G 1 Syringe by Does not apply route every 30 (thirty) days.   calcium-vitamin D 500-200 MG-UNIT tablet Commonly known as: OSCAL WITH D Take 2 tablets by mouth daily with breakfast.   donepezil 10 MG tablet Commonly known as: ARICEPT Take 10 mg by mouth daily.   HYDROcodone-acetaminophen 5-325 MG tablet Commonly known as: NORCO/VICODIN Take 1 tablet by mouth every 6 (six) hours as needed for moderate pain or severe pain.        Contact information for follow-up providers     [name], MD Follow up.   Specialty: Internal Medicine Contact information: [address] [place] [address] [phone]         Carole Civil, MD Follow up.   Specialties: Orthopedic Surgery,  Radiology Contact information: [address] [place] [address] [phone]              Contact information for after-discharge care     [facility] .   Service: Skilled Nursing Contact information: [address] [phone]                    Discharge Exam: Filed Weights   10/08/22 1600 10/09/22 1244  Weight: 64.4 kg 64.4 kg  BP 118/67   Pulse 81   Temp 98.6 F (37 C) (Oral)   Resp 18   Wt 64.4 kg   SpO2 95%   BMI 25.97 kg/m   No distress, elderly R lateral thigh dressing c/d/I without erythema or discharge. No visible or palpable deformities. NVI distally. +foley catheter  Condition at discharge: stable  The results of significant diagnostics from this hospitalization (including imaging, microbiology, ancillary and laboratory) are listed below for reference.   Imaging Studies: CT CHEST WO CONTRAST  Result Date: 10/12/2022 CLINICAL DATA:  89 year old female with respiratory illness. Cough. Nondiagnostic x-ray. EXAM: CT CHEST WITHOUT CONTRAST TECHNIQUE: Multidetector CT imaging of the chest was performed following the standard protocol without IV contrast. RADIATION DOSE REDUCTION: This exam was performed according to the departmental dose-optimization  program which includes automated exposure control, adjustment of the mA and/or kV according to patient size and/or use of iterative reconstruction technique. COMPARISON:  Portable chest 10/10/2022. previous CT Abdomen and Pelvis 11/05/2015. FINDINGS: Cardiovascular: Cardiac size is stable since 2017, upper limits of normal to borderline enlarged. No pericardial effusion. Calcified coronary artery atherosclerosis on series 2, image 70. Extensive Calcified aortic atherosclerosis. Mild tortuosity of the thoracic aorta. Vascular patency is not evaluated in the absence of IV contrast. Mediastinum/Nodes: Indistinct but  probably reactive right paratracheal lymph nodes measuring up to 14 mm diameter on series 2, image 42. No discrete mediastinal mass. Lungs/Pleura: Small volume bilateral dependent pleural effusions. Lower lung volumes compared to 2017. Widespread and confluent subpleural reticular and streaky lung opacity. Mild superimposed respiratory motion. Atelectatic changes to the bilateral major airways. Trachea and carina remain patent. Scattered areas of patchy peribronchial opacity which are nonspecific. No primary area of lung consolidation. Upper Abdomen: Extensive cholelithiasis on series 2, image 138 is chronic. No obvious pericholecystic inflammation. Otherwise noncontrast liver, spleen, visible pancreas, adrenal glands and bowel appear negative. Chronic benign left renal upper pole cyst with simple fluid density (no follow-up imaging recommended). Right nephrolithiasis versus vascular calcifications. Musculoskeletal: Osteopenia. Chronic T9 and T10 compression fractures were present in 2017. Partially healed chronic mid sternal fracture series 7, image 102. Chronic rib fractures. No acute or suspicious osseous lesion identified. IMPRESSION: 1. Widespread subpleural lung opacity, with lower lung volumes since 2017, trace pleural effusions, and scattered nonspecific peribronchial opacity. Atelectatic changes to the hilar airways. No overt lung consolidation. The appearance is suspicious for Chronic Lung Disease with Developing Fibrosis. Superimposed viral/atypical Respiratory Infection is difficult to exclude. And there are reactive appearing right paratracheal lymph nodes. 2. Chronic mild cardiomegaly is stable since 2017. Calcified coronary artery and Aortic Atherosclerosis (ICD10-I70.0). 3. Chronic cholelithiasis. Right nephrolithiasis versus vascular calcifications. Electronically Signed   By: Genevie Ann M.D.   On: 10/12/2022 05:02   DG CHEST PORT 1 VIEW  Result Date: 10/10/2022 CLINICAL DATA:  Table formatting  from the original note was not included. Hypoxia EXAM: PORTABLE CHEST - 1 VIEW COMPARISON:  10/08/2022 FINDINGS: Worsening scattered interstitial and coarse airspace opacities throughout both lungs. Mild cardiomegaly.  Aortic Atherosclerosis (ICD10-170.0). Blunting of left lateral costophrenic angle. Visualized bones unremarkable. IMPRESSION: 1. Worsening scattered interstitial and airspace opacities. 2. Mild cardiomegaly. Electronically Signed   By: Lucrezia Europe M.D.   On: 10/10/2022 09:49   DG Pelvis Portable  Result Date: 10/09/2022 CLINICAL DATA:  Postop EXAM: PORTABLE PELVIS 2 VIEWS COMPARISON:  None Available. FINDINGS: Patient is status post right total hip arthroplasty. Pelvic ring appears to be intact. The osseous structures are osteopenic. There is anatomic alignment. No acute fracture is seen. IMPRESSION: Postop right hip arthroplasty. Electronically Signed   By: Sammie Bench M.D.   On: 10/09/2022 16:33   CT Head Wo Contrast  Result Date: 10/08/2022 CLINICAL DATA:  [place] onto floor, dimension EXAM: CT HEAD WITHOUT CONTRAST TECHNIQUE: Contiguous axial images were obtained from the base of the skull through the vertex without intravenous contrast. RADIATION DOSE REDUCTION: This exam was performed according to the departmental dose-optimization program which includes automated exposure control, adjustment of the mA and/or kV according to patient size and/or use of iterative reconstruction technique. COMPARISON:  05/16/2022 FINDINGS: Brain: No acute infarct or hemorrhage. Stable chronic small-vessel ischemic changes throughout the periventricular white matter. Lateral ventricles and midline structures are unremarkable. No acute extra-axial fluid collections. No mass effect. Vascular: No hyperdense  vessel or unexpected calcification. Skull: Normal. Negative for fracture or focal lesion. Sinuses/Orbits: Chronic sphenoid sinus mucosal thickening. No gas fluid levels. Other: None. IMPRESSION: 1. Stable  head CT, no acute intracranial process. Electronically Signed   By: Randa Ngo M.D.   On: 10/08/2022 18:17   DG Knee 2 Views Right  Result Date: 10/08/2022 CLINICAL DATA:  fall pain EXAM: RIGHT KNEE - 2 VIEW COMPARISON:  None Available. FINDINGS: Osseous structures are osteopenic. No evidence of fracture, dislocation, or joint effusion. There is tricompartmental joint space narrowing, sclerosis and osteophytes consistent with degenerative joint disease. No evidence of effusion. IMPRESSION: Degenerative changes. Osteopenia. Electronically Signed   By: Sammie Bench M.D.   On: 10/08/2022 17:45   DG Hip Unilat With Pelvis 2-3 Views Right  Result Date: 10/08/2022 CLINICAL DATA:  Fall, pain EXAM: DG HIP (WITH OR WITHOUT PELVIS) 2-3V RIGHT COMPARISON:  None Available. FINDINGS: Right femoral neck subcapital fracture with varus angulation. Osseous structures are osteopenic. Pelvic ring appears intact PAC. Lumbosacral degenerative changes. IMPRESSION: Right femoral neck fracture. Electronically Signed   By: Sammie Bench M.D.   On: 10/08/2022 17:44   DG Chest 1 View  Result Date: 10/08/2022 CLINICAL DATA:  Fall, pain EXAM: CHEST  1 VIEW COMPARISON:  07/21/2018 FINDINGS: The heart size and mediastinal contours are within normal limits. Both lungs are clear. Aorta is calcified. Osseous structures are not well evaluated. IMPRESSION: No active disease. Electronically Signed   By: Sammie Bench M.D.   On: 10/08/2022 17:42    Microbiology: Results for orders placed or performed during the hospital encounter of 10/08/22  Culture, blood (Routine X 2) w Reflex to ID Panel     Status: None (Preliminary result)   Collection Time: 10/10/22  9:26 AM   Specimen: BLOOD  Result Value Ref Range Status   Specimen Description BLOOD BLOOD LEFT ARM  Final   Special Requests   Final    Blood Culture results Tamara not be optimal due to an excessive volume of blood received in culture bottles   Culture   Final     NO GROWTH 3 DAYS Performed at [facility], [address] [place], [place] [address]    Report Status PENDING  Incomplete  Culture, blood (Routine X 2) w Reflex to ID Panel     Status: None (Preliminary result)   Collection Time: 10/10/22  9:26 AM   Specimen: BLOOD  Result Value Ref Range Status   Specimen Description BLOOD BLOOD RIGHT ARM  Final   Special Requests   Final    Blood Culture results Tamara not be optimal due to an excessive volume of blood received in culture bottles   Culture   Final    NO GROWTH 3 DAYS Performed at [facility], [address], [place], [place] [address]    Report Status PENDING  Incomplete  SARS Coronavirus 2 by RT PCR (hospital order, performed in [place] hospital lab) *cepheid single result test* Anterior Nasal Swab     Status: None   Collection Time: 10/13/22 11:06 AM   Specimen: Anterior Nasal Swab  Result Value Ref Range Status   SARS Coronavirus 2 by RT PCR NEGATIVE NEGATIVE Final    Comment: (NOTE) SARS-CoV-2 target nucleic acids are NOT DETECTED.  The SARS-CoV-2 RNA is generally detectable in upper and lower respiratory specimens during the acute phase of infection. The lowest concentration of SARS-CoV-2 viral copies this assay can detect is 250 copies / mL. A negative result does  not preclude SARS-CoV-2 infection and should not be used as the sole basis for treatment or other patient management decisions.  A negative result Tamara occur with improper specimen collection / handling, submission of specimen other than nasopharyngeal swab, presence of viral mutation(s) within the areas targeted by this assay, and inadequate number of viral copies (<250 copies / mL). A negative result must be combined with clinical observations, patient history, and epidemiological information.  Fact Sheet for Patients:   https://www.patel.info/  Fact Sheet for Healthcare Providers: https://hall.com/  This test is  not yet approved or  cleared by the [place] FDA and has been authorized for detection and/or diagnosis of SARS-CoV-2 by FDA under an Emergency Use Authorization (EUA).  This EUA will remain in effect (meaning this test can be used) for the duration of the COVID-19 declaration under Section 564(b)(1) of the Act, 21 U.S.C. section 360bbb-3(b)(1), unless the authorization is terminated or revoked sooner.  Performed at [facility], [address] [place], [place] [address]     Labs: CBC: Recent Labs  Lab 10/08/22 1612 10/09/22 0451 10/10/22 0926 10/11/22 1430  WBC 12.0* 11.7* 9.4 8.1  NEUTROABS 10.3*  --   --   --   HGB 12.2 12.9 9.6* 9.4*  HCT 36.6 38.6 29.6* 28.7*  MCV 94.8 95.1 96.1 96.3  PLT 195 197 132* [id number]*   Basic Metabolic Panel: Recent Labs  Lab 10/09/22 0451 10/10/22 0926 10/11/22 1430 10/12/22 0432 10/14/22 0446  NA 136 139 134* 136 136  K 3.5 3.7 3.6 3.8 3.5  CL 102 110 106 105 105  CO2 23 22 21* 24 25  GLUCOSE 128* 110* 143* 101* 105*  BUN 20 23 18 17 14   CREATININE 0.89 1.11* 0.83 0.84 0.69  CALCIUM 8.5* 7.3* 7.3* 7.4* 7.7*  MG  --   --  2.0  --   --    Liver Function Tests: Recent Labs  Lab 10/10/22 0926 10/11/22 1430  AST 43* 30  ALT 19 19  ALKPHOS 56 55  BILITOT 1.5* 0.9  PROT 5.6* 5.5*  ALBUMIN 2.8* 2.6*   CBG: No results for input(s): "GLUCAP" in the last 168 hours.  Discharge time spent: greater than 30 minutes.  Signed: Patrecia Pour, MD [facility] 10/14/2022

## 2022-10-14 NOTE — Progress Notes (Signed)
Patient continues to be alert to self and confused to place time and situation. Patient slept on and off this shift. Norco/Vicodin given once this shift for pain to hip. Continued to monitor.

## 2022-10-14 NOTE — Addendum Note (Signed)
Addendum  created 10/14/22 1127 by Denese Killings, MD   Child order released for a procedure order, Clinical Note Signed, Intraprocedure Blocks edited, SmartForm saved

## 2022-10-14 NOTE — Anesthesia Procedure Notes (Signed)
Spinal  Patient location during procedure: OR Start time: 10/09/2022 2:02 PM End time: 10/09/2022 2:12 PM Reason for block: surgical anesthesia Staffing Performed: anesthesiologist  Anesthesiologist: Denese Killings, MD Performed by: Denese Killings, MD Authorized by: Denese Killings, MD   Preanesthetic Checklist Completed: patient identified, IV checked, site marked, risks and benefits discussed, surgical consent, monitors and equipment checked, pre-op evaluation and timeout performed Spinal Block Patient position: right lateral decubitus Prep: ChloraPrep Patient monitoring: heart rate, cardiac monitor, continuous pulse ox and blood pressure Approach: midline Location: L4-5 Injection technique: single-shot Needle Needle type: Spinocan  Needle gauge: 24 G Needle length: 9 cm Needle insertion depth: 5 cm Assessment Events: CSF return

## 2022-10-14 NOTE — Progress Notes (Signed)
Location:  [facility] Room Number: [id number] Place of Service:  SNF (31)   CODE STATUS: Full Code  No Known Allergies  Chief Complaint  Patient presents with   Hospitalization Follow-up    Hospital Follow-up since been discharge from the hospital on October 08, 2022    HPI:  She is a 89 year old woman who has been hospitalized from 10-08-22 through 10-14-22. Her medical history includes: dementia; diabetes mellitus; right breast cancer; vitamin B 12 deficiency. She presented to the ED after suffering an unwitnessed fall with right leg pain. She crawled to the phone to call her daughter. It was felt she fell trying to sweep up cereal. Prior to her hospitalization she had been ambulatory without assistance and able to perform her own adl care. On 10-09-22 she had a right bipolar hip arthoplasty by Dr. Aline Brochure. She is here for short term rehab with her goal to return back home. She denies any pain. She will continue to be followed for her chronic illnesses including: Vitamin B 12 deficiency:  Iron deficiency anemia unspecified iron deficiency type:  Dementia without behavioral disturbance:Fibrosis lung:    Past Medical History:  Diagnosis Date   B12 deficiency 06/08/2016   Breast cancer ([place])    Breast cancer, right breast ([place]) 04/25/2013   Cancer ([place])    Claustrophobia    Claustrophobia    Dementia ([place])    Hypertension    Iron deficiency anemia 12/15/2016   Malignant neoplasm of upper-outer quadrant of RIGHT female breast ([place]) 04/25/2013   Stage IA (T1b, N0, M0) invasive mucinous breast carcinoma, S/P right breast lumpectomy by Dr. Arnoldo Morale on 03/08/2013 with 1 negative sentinel node. Cancer was identified as ER+ 100%, PR+ 85%, Her2 negative, and Ki-67 marker at 17%. Oncotype Dx score of 20 with 13% 10 year risk of distant recurrence and absolute benefit of chemotherapy at 10 years in this risk category is negligible and less than 1%.   Osteoporosis     Past  Surgical History:  Procedure Laterality Date   ABDOMINAL HYSTERECTOMY     CATARACT EXTRACTION, BILATERAL     PARTIAL MASTECTOMY WITH NEEDLE LOCALIZATION AND AXILLARY SENTINEL LYMPH NODE BX Right 03/08/2013   Procedure: PARTIAL MASTECTOMY WITH NEEDLE LOCALIZATION AND AXILLARY SENTINEL LYMPH NODE BX;  Surgeon: Jamesetta So, MD;  Location: [facility];  Service: General;  Laterality: Right;  Sentinel Node Bx @ 7:30am Needle Loc @ 8:00am    Social History   Socioeconomic History   Marital status: Widowed    Spouse name: Not on file   Number of children: Not on file   Years of education: Not on file   Highest education level: Not on file  Occupational History   Not on file  Tobacco Use   Smoking status: Former    Packs/day: 0.50    Years: 40.00    Additional pack years: 0.00    Total pack years: 20.00    Types: Cigarettes    Quit date: 03/03/1967    Years since quitting: 55.6   Smokeless tobacco: Never  Vaping Use   Vaping Use: Never used  Substance and Sexual Activity   Alcohol use: No   Drug use: No   Sexual activity: Yes    Birth control/protection: Surgical  Other Topics Concern   Not on file  Social History Narrative   Not on file   Social Determinants of Health   Financial Resource Strain: Not on file  Food Insecurity: No Food  Insecurity (10/08/2022)   Hunger Vital Sign    Worried About Running Out of Food in the Last Year: Never true    [place] in the Last Year: Never true  Transportation Needs: No Transportation Needs (10/08/2022)   PRAPARE - Hydrologist (Medical): No    Lack of Transportation (Non-Medical): No  Physical Activity: Not on file  Stress: Not on file  Social Connections: Not on file  Intimate Partner Violence: Not At Risk (10/08/2022)   Humiliation, Afraid, Rape, and Kick questionnaire    Fear of Current or Ex-Partner: No    Emotionally Abused: No    Physically Abused: No    Sexually Abused: No   Family History   Problem Relation Age of Onset   Diabetes Mother    Cancer Father    Cancer Sister    Cancer Brother       VITAL SIGNS BP 135/76   Pulse 70   Temp 97.8 F (36.6 C)   Resp 20   Ht 5\' 2"  (1.575 m)   Wt 137 lb (62.1 kg)   SpO2 95%   BMI 25.06 kg/m   Outpatient Encounter Medications as of 10/14/2022  Medication Sig   acetaminophen (TYLENOL) 325 MG tablet Take 2 tablets (650 mg total) by mouth every 6 (six) hours as needed for mild pain.   calcium-vitamin D (OSCAL WITH D) 500-200 MG-UNIT per tablet Take 2 tablets by mouth daily with breakfast.   donepezil (ARICEPT) 10 MG tablet Take 10 mg by mouth daily.   enoxaparin (LOVENOX) 40 MG/0.4ML injection Inject 40 mg into the skin daily.   HYDROcodone-acetaminophen (NORCO/VICODIN) 5-325 MG tablet Take 1 tablet by mouth every 6 (six) hours as needed for moderate pain or severe pain.   NEEDLE, DISP, 27 G (BD DISP NEEDLES) 27G X 1/2" MISC 1 Syringe by Does not apply route every 30 (thirty) days.   UNABLE TO FIND Diet - Liquids: Regular Thin Diet: REGULAR      SIGNIFICANT DIAGNOSTIC EXAMS    Review of Systems  Constitutional:  Negative for malaise/fatigue.  Respiratory:  Negative for cough and shortness of breath.   Cardiovascular:  Negative for chest pain, palpitations and leg swelling.  Gastrointestinal:  Negative for abdominal pain, constipation and heartburn.  Musculoskeletal:  Negative for back pain, joint pain and myalgias.  Skin: Negative.   Neurological:  Negative for dizziness.  Psychiatric/Behavioral:  The patient is not nervous/anxious.    Physical Exam Constitutional:      General: She is not in acute distress.    Appearance: She is well-developed. She is not diaphoretic.  Neck:     Thyroid: No thyromegaly.  Cardiovascular:     Rate and Rhythm: Normal rate and regular rhythm.     Pulses: Normal pulses.     Heart sounds: Normal heart sounds.  Pulmonary:     Effort: Pulmonary effort is normal. No respiratory  distress.     Breath sounds: Normal breath sounds.  Abdominal:     General: Bowel sounds are normal. There is no distension.     Palpations: Abdomen is soft.     Tenderness: There is no abdominal tenderness.  Musculoskeletal:        General: Normal range of motion.     Cervical back: Neck supple.     Right lower leg: No edema.     Left lower leg: No edema.  Lymphadenopathy:     Cervical: No cervical adenopathy.  Skin:  General: Skin is warm and dry.  Neurological:     Mental Status: She is alert. Mental status is at baseline.  Psychiatric:        Mood and Affect: Mood normal.       ASSESSMENT/ PLAN:  TODAY  Closed displace fracture of right femoral neck: will continue therapy as directed and will follow up with orthopedics; will continue lovenox 40 mg daily; has vicodin 5/325 mg every 6 hours as needed  2. Vitamin B 12 deficiency: level os 105; will begin IM injections weekly for 4 weeks  3. Iron deficiency anemia unspecified iron deficiency type: hgb 12.2 will monitor  4. Dementia without behavioral disturbance: weight is 137 pounds will continue aricept 10 mg daily   5. Fibrosis lung: is on room air  6. Aortic atherosclerosis (ct 10-11-22)  7. Hypertension associated with type 2 diabetes mellitus: b/p 135/76  8. Type 2 diabetes mellitus with complication without current long term use of insulin: hgb A1c 5.9    Ok Edwards NP [facility]  call [phone]

## 2022-10-14 NOTE — Progress Notes (Signed)
Report given to nurse Dela at [facility].

## 2022-10-15 LAB — CULTURE, BLOOD (ROUTINE X 2)
Culture: NO GROWTH
Culture: NO GROWTH

## 2022-10-19 DIAGNOSIS — I700 Atherosclerosis of aorta: Secondary | ICD-10-CM | POA: Insufficient documentation

## 2022-10-19 DIAGNOSIS — I152 Hypertension secondary to endocrine disorders: Secondary | ICD-10-CM | POA: Insufficient documentation

## 2022-10-20 ENCOUNTER — Encounter: Payer: Self-pay | Admitting: Internal Medicine

## 2022-10-20 ENCOUNTER — Other Ambulatory Visit (HOSPITAL_COMMUNITY)
Admission: RE | Admit: 2022-10-20 | Discharge: 2022-10-20 | Disposition: A | Discharge status: Home / Self Care | Payer: PPO | Source: Skilled Nursing Facility | Attending: Adult Health | Admitting: Adult Health

## 2022-10-20 ENCOUNTER — Non-Acute Institutional Stay (SKILLED_NURSING_FACILITY): Payer: PPO | Admitting: Internal Medicine

## 2022-10-20 DIAGNOSIS — F0390 Unspecified dementia without behavioral disturbance: Secondary | ICD-10-CM

## 2022-10-20 DIAGNOSIS — R7303 Prediabetes: Secondary | ICD-10-CM | POA: Diagnosis not present

## 2022-10-20 DIAGNOSIS — N3091 Cystitis, unspecified with hematuria: Secondary | ICD-10-CM | POA: Diagnosis not present

## 2022-10-20 DIAGNOSIS — D62 Acute posthemorrhagic anemia: Secondary | ICD-10-CM

## 2022-10-20 DIAGNOSIS — D500 Iron deficiency anemia secondary to blood loss (chronic): Secondary | ICD-10-CM

## 2022-10-20 DIAGNOSIS — S72001A Fracture of unspecified part of neck of right femur, initial encounter for closed fracture: Secondary | ICD-10-CM | POA: Diagnosis not present

## 2022-10-20 DIAGNOSIS — E118 Type 2 diabetes mellitus with unspecified complications: Secondary | ICD-10-CM

## 2022-10-20 DIAGNOSIS — E1159 Type 2 diabetes mellitus with other circulatory complications: Secondary | ICD-10-CM | POA: Insufficient documentation

## 2022-10-20 DIAGNOSIS — E538 Deficiency of other specified B group vitamins: Secondary | ICD-10-CM | POA: Diagnosis not present

## 2022-10-20 LAB — CBC
HCT: 27.6 % — ABNORMAL LOW (ref 36.0–46.0)
Hemoglobin: 8.8 g/dL — ABNORMAL LOW (ref 12.0–15.0)
MCH: 31.2 pg (ref 26.0–34.0)
MCHC: 31.9 g/dL (ref 30.0–36.0)
MCV: 97.9 fL (ref 80.0–100.0)
Platelets: 318 10*3/uL (ref 150–400)
RBC: 2.82 MIL/uL — ABNORMAL LOW (ref 3.87–5.11)
RDW: 14 % (ref 11.5–15.5)
WBC: 6.7 10*3/uL (ref 4.0–10.5)
nRBC: 0 % (ref 0.0–0.2)

## 2022-10-20 LAB — BASIC METABOLIC PANEL
Anion gap: 7 (ref 5–15)
BUN: 20 mg/dL (ref 8–23)
CO2: 25 mmol/L (ref 22–32)
Calcium: 7.8 mg/dL — ABNORMAL LOW (ref 8.9–10.3)
Chloride: 105 mmol/L (ref 98–111)
Creatinine, Ser: 0.74 mg/dL (ref 0.44–1.00)
GFR, Estimated: >60 mL/min (ref >60)
Glucose, Bld: 94 mg/dL (ref 70–99)
Potassium: 3.8 mmol/L (ref 3.5–5.1)
Sodium: 137 mmol/L (ref 135–145)

## 2022-10-20 NOTE — Assessment & Plan Note (Signed)
Serially H/H continue to drop.  Iron supplementation will be initiated and monitor for bleeding dyscrasias pursued as she is on Lovenox DVT prophylaxis.

## 2022-10-20 NOTE — Assessment & Plan Note (Signed)
Glucoses while hospitalized ranged from a low of 101 up to a high of 143.  A1c was prediabetic at 5.9%.  No indication for medication.

## 2022-10-20 NOTE — Assessment & Plan Note (Signed)
She received a full course of antibiotics; but urine culture revealed no growth.

## 2022-10-20 NOTE — Patient Instructions (Signed)
See assessment and plan under each diagnosis in the problem list and acutely for this visit 

## 2022-10-20 NOTE — Assessment & Plan Note (Addendum)
PT/OT at SNF as dementia allows compliance.

## 2022-10-20 NOTE — Progress Notes (Unsigned)
NURSING HOME LOCATION:  [facility] ROOM NUMBER:  [id number] P  CODE STATUS:  Full Code  PCP: [name] MD  This is a comprehensive admission note to this SNFperformed on this date less than 30 days from date of admission. Included are preadmission medical/surgical history; reconciled medication list; family history; social history and comprehensive review of systems.  Corrections and additions to the records were documented. Comprehensive physical exam was also performed. Additionally a clinical summary was entered for each active diagnosis pertinent to this admission in the Problem List to enhance continuity of care.  HPI: She was hospitalized 3/21 - 10/14/2022 with closed displaced fracture of the right femoral neck sustained in an unwitnessed fall at home.  Her fall was in the context of dementia. In the ED temp of 99.6 was documented.  White count was 12,000.  Imaging revealed right femoral neck fracture; Dr. Arther Abbott consulted;right bipolar hip arthroplasty was completed 3/22.   Postoperatively she had a fever attributed to UTI.  She received 1 day of Ancef and 4 days of ceftriaxone with fosfomycin 3/27.  Urine culture collected by cath 3/26 subsequently revealed no growth. Hospitalization was also associated with delirium for which delirium protocol was initiated.  Aricept was continued for her dementia.  CT of the head 3/21 revealed chronic small vessel ischemic changes in the periventricular white matter.  B12 level was 105. Amlodipine and losartan were held as she remained normotensive. At admission H/H was 12.2/36.6; nadir values were 9.4/28.7 post op.She is on Lovenox DVT prophylaxis.Marland Kitchen She has a history of diabetes without medical treatment; A1c is 5.9%, prediabetic. Glucoses while hospitalized ranged from a low of 101 up to a high of 143.  Initial calcium was 8.6 with a nadir of 7.3.   PT/OT consulted and recommended SNF placement for rehab.  She was to be WBAT  with orthopedic follow-up 2 weeks postdischarge.  Past medical and surgical history: Includes history of B12 deficiency, history of breast cancer, essential hypertension, history of iron deficiency anemia, osteoporosis, aortic atherosclerosis, and idiopathic fibrosing alveolitis. Surgeries and procedures include abdominal hysterectomy and partial mastectomy with needle localization and axillary sentinel lymph node biopsy.  She apparently completed chemotherapy in October 2019.  Radiation was declined.  She sees Dr. Delton Coombes routinely for follow-up.  Social history:non drinker ; former 20 pack year smoker. Apparently she lives with her adult daughter who still works.  Apparently the daughter calls her throughout the day instructing her as to food intake & ADLs.  Family history: Noncontributory due to advanced age.   Review of systems: Severe neurocognitive deficits made validity of responses questionable.  Review of systems was totally negative.  She did not realize she had been in the hospital.  She stated her last hospitalization was "quite a few years ago."  She even denied having any musculoskeletal pain.  Constitutional: No fever, significant weight change, fatigue  Eyes: No redness, discharge, pain, vision change ENT/mouth: No nasal congestion, purulent discharge, earache, change in hearing, sore throat  Cardiovascular: No chest pain, palpitations, paroxysmal nocturnal dyspnea, claudication, edema  Respiratory: No cough, sputum production, hemoptysis, DOE, significant snoring, apnea Gastrointestinal: No heartburn, dysphagia, abdominal pain, nausea /vomiting, rectal bleeding, melena, change in bowels Genitourinary: No dysuria, hematuria, pyuria, incontinence, nocturia Musculoskeletal: No joint stiffness, joint swelling, weakness, pain Dermatologic: No rash, pruritus, change in appearance of skin Neurologic: No dizziness, headache, syncope, seizures, numbness, tingling Psychiatric: No  significant anxiety, depression, insomnia, anorexia Endocrine: No change in hair/skin/nails, excessive  thirst, excessive hunger, excessive urination  Hematologic/lymphatic: No significant bruising, lymphadenopathy, abnormal bleeding Allergy/immunology: No itchy/watery eyes, significant sneezing, urticaria, angioedema  Physical exam:  Pertinent or positive findings: She appears her age and in no acute distress.  Weathered facies are present; her expression is blank.  Eyebrows are absent.  She has complete dentures.  She has dry rales over the lower third of the right posterior thorax and at the left base.  Pedal pulses are nonpalpable.  She has 1+ edema.  Foley catheter is present.  She did not know what the Foley catheter was for., referring to it as "part of clothing."  There is a localized deformity of the third right DIP joint.  There is a suggestion of possible early clubbing of the nailbeds.  General appearance: no acute distress, increased work of breathing is present.   Lymphatic: No lymphadenopathy about the head, neck, axilla. Eyes: No conjunctival inflammation or lid edema is present. There is no scleral icterus. Ears:  External ear exam shows no significant lesions or deformities.   Nose:  External nasal examination shows no deformity or inflammation. Nasal mucosa are pink and moist without lesions, exudates Neck:  No thyromegaly, masses, tenderness noted.    Heart:  Normal rate and regular rhythm. S1 and S2 normal without gallop, murmur, click, rub.  Lungs: without wheezes, rhonchi, rubs. Abdomen: Bowel sounds are normal. Abdomen is soft and nontender with no organomegaly, hernias, masses. GU: Deferred  Extremities:  No cyanosis Neurologic exam:  Balance, Rhomberg, finger to nose testing could not be completed due to clinical state Skin: Warm & dry w/o tenting. No significant lesions or rash.  See clinical summary under each active problem in the Problem List with associated  updated therapeutic plan

## 2022-10-20 NOTE — Assessment & Plan Note (Signed)
Weekly parenteral B12 initiated at the SNF.

## 2022-10-21 ENCOUNTER — Encounter (HOSPITAL_COMMUNITY): Payer: Self-pay | Admitting: Orthopedic Surgery

## 2022-10-21 ENCOUNTER — Encounter (HOSPITAL_COMMUNITY): Payer: Self-pay | Admitting: Hematology

## 2022-10-21 NOTE — Assessment & Plan Note (Signed)
Current B12 105, but indices normal. Parenteral B12 ordered.

## 2022-10-22 ENCOUNTER — Non-Acute Institutional Stay (SKILLED_NURSING_FACILITY): Payer: PPO | Admitting: Adult Health

## 2022-10-22 ENCOUNTER — Other Ambulatory Visit (HOSPITAL_COMMUNITY)
Admission: RE | Admit: 2022-10-22 | Discharge: 2022-10-22 | Disposition: A | Discharge status: Home / Self Care | Payer: PPO | Source: Skilled Nursing Facility | Attending: Adult Health | Admitting: Adult Health

## 2022-10-22 ENCOUNTER — Encounter: Payer: Self-pay | Admitting: Adult Health

## 2022-10-22 DIAGNOSIS — D62 Acute posthemorrhagic anemia: Secondary | ICD-10-CM

## 2022-10-22 DIAGNOSIS — D649 Anemia, unspecified: Secondary | ICD-10-CM | POA: Insufficient documentation

## 2022-10-22 DIAGNOSIS — E611 Iron deficiency: Secondary | ICD-10-CM

## 2022-10-22 LAB — RETICULOCYTES
Immature Retic Fract: 22.5 % — ABNORMAL HIGH (ref 2.3–15.9)
RBC.: 2.92 MIL/uL — ABNORMAL LOW (ref 3.87–5.11)
Retic Count, Absolute: 70.4 10*3/uL (ref 19.0–186.0)
Retic Ct Pct: 2.4 % (ref 0.4–3.1)

## 2022-10-22 LAB — IRON AND TIBC
Iron: 25 ug/dL — ABNORMAL LOW (ref 28–170)
Saturation Ratios: 11 % (ref 10.4–31.8)
TIBC: 231 ug/dL — ABNORMAL LOW (ref 250–450)
UIBC: 206 ug/dL

## 2022-10-22 NOTE — Progress Notes (Signed)
Location:  [facility] Room Number: S128P Place of Service:  SNF (31)   CODE STATUS: Full Code  No Known Allergies  Chief Complaint  Patient presents with   Acute Visit    Lab Follow up    HPI:  Her ghb is 8.8. her iron leve lis low at 25. She will need iron supplementation. And she will need to have her hgb followed up on as well. She denies any weakness; no shortness of breath.   Past Medical History:  Diagnosis Date   B12 deficiency 06/08/2016   Breast cancer    Breast cancer, right breast 04/25/2013   Cancer    Claustrophobia    Claustrophobia    Dementia    Hypertension    Iron deficiency anemia 12/15/2016   Malignant neoplasm of upper-outer quadrant of RIGHT female breast ([facility]) 04/25/2013   Stage IA (T1b, N0, M0) invasive mucinous breast carcinoma, S/P right breast lumpectomy by Dr. Lovell Sheehan on 03/08/2013 with 1 negative sentinel node. Cancer was identified as ER+ 100%, PR+ 85%, Her2 negative, and Ki-67 marker at 17%. Oncotype Dx score of 20 with 13% 10 year risk of distant recurrence and absolute benefit of chemotherapy at 10 years in this risk category is negligible and less than 1%.   Osteoporosis     Past Surgical History:  Procedure Laterality Date   ABDOMINAL HYSTERECTOMY     CATARACT EXTRACTION, BILATERAL     HIP ARTHROPLASTY Right 10/09/2022   Procedure: ARTHROPLASTY BIPOLAR HIP (HEMIARTHROPLASTY);  Surgeon: Vickki Hearing, MD;  Location: [facility];  Service: Orthopedics;  Laterality: Right;   PARTIAL MASTECTOMY WITH NEEDLE LOCALIZATION AND AXILLARY SENTINEL LYMPH NODE BX Right 03/08/2013   Procedure: PARTIAL MASTECTOMY WITH NEEDLE LOCALIZATION AND AXILLARY SENTINEL LYMPH NODE BX;  Surgeon: Dalia Heading, MD;  Location: [facility];  Service: General;  Laterality: Right;  Sentinel Node Bx @ 7:30am Needle Loc @ 8:00am    Social History   Socioeconomic History   Marital status: Widowed    Spouse name: Not on file   Number of children: Not on  file   Years of education: Not on file   Highest education level: Not on file  Occupational History   Not on file  Tobacco Use   Smoking status: Former    Packs/day: 0.50    Years: 40.00    Additional pack years: 0.00    Total pack years: 20.00    Types: Cigarettes    Quit date: 03/03/1967    Years since quitting: 55.6   Smokeless tobacco: Never  Vaping Use   Vaping Use: Never used  Substance and Sexual Activity   Alcohol use: No   Drug use: No   Sexual activity: Yes    Birth control/protection: Surgical  Other Topics Concern   Not on file  Social History Narrative   Not on file   Social Determinants of Health   Financial Resource Strain: Not on file  Food Insecurity: No Food Insecurity (10/08/2022)   Hunger Vital Sign    Worried About Running Out of Food in the Last Year: Never true    Ran Out of Food in the Last Year: Never true  Transportation Needs: No Transportation Needs (10/08/2022)   PRAPARE - Administrator, Civil Service (Medical): No    Lack of Transportation (Non-Medical): No  Physical Activity: Not on file  Stress: Not on file  Social Connections: Not on file  Intimate Partner Violence: Not At Risk (  10/08/2022)   Humiliation, Afraid, Rape, and Kick questionnaire    Fear of Current or Ex-Partner: No    Emotionally Abused: No    Physically Abused: No    Sexually Abused: No   Family History  Problem Relation Age of Onset   Diabetes Mother    Cancer Father    Cancer Sister    Cancer Brother       VITAL SIGNS BP 135/68   Pulse 88   Temp (!) 97.1 F (36.2 C)   Resp 18   Ht 5\' 2"  (1.575 m)   Wt 142 lb (64.4 kg)   SpO2 98%   BMI 25.97 kg/m   Outpatient Encounter Medications as of 10/22/2022  Medication Sig   acetaminophen (TYLENOL) 325 MG tablet Take 2 tablets (650 mg total) by mouth every 6 (six) hours as needed for mild pain.   Balsam [place]-Castor Oil (VENELEX) OINT Apply to bilateral buttock,coccyx and sacrum every shift.    calcium-vitamin D (OSCAL WITH D) 500-200 MG-UNIT per tablet Take 2 tablets by mouth daily with breakfast.   cyanocobalamin (VITAMIN B12) 1000 MCG/ML injection Inject 1,000 mcg into the muscle. Once a day on Tuesday.   donepezil (ARICEPT) 10 MG tablet Take 10 mg by mouth daily.   enoxaparin (LOVENOX) 40 MG/0.4ML injection Inject 40 mg into the skin daily.   ferrous sulfate 325 (65 FE) MG tablet Take 325 mg by mouth. Once a day on Monday and Thursday   NEEDLE, DISP, 27 G (BD DISP NEEDLES) 27G X 1/2" MISC 1 Syringe by Does not apply route every 30 (thirty) days.   UNABLE TO FIND Diet - Liquids: Regular Thin Diet: REGULAR   [DISCONTINUED] HYDROcodone-acetaminophen (NORCO/VICODIN) 5-325 MG tablet Take 1 tablet by mouth every 6 (six) hours as needed for moderate pain or severe pain.   No facility-administered encounter medications on file as of 10/22/2022.     SIGNIFICANT DIAGNOSTIC EXAMS  TODAY  10-20-22: wbc 6.7;hgb 8.8; hct 27.6; mcv 97.9 plt 318 10-22-22; iron 25; tibc 231  Review of Systems  Constitutional:  Negative for malaise/fatigue.  Respiratory:  Negative for cough and shortness of breath.   Cardiovascular:  Negative for chest pain, palpitations and leg swelling.  Gastrointestinal:  Negative for abdominal pain, constipation and heartburn.  Musculoskeletal:  Negative for back pain, joint pain and myalgias.  Skin: Negative.   Neurological:  Negative for dizziness.  Psychiatric/Behavioral:  The patient is not nervous/anxious.    Physical Exam Constitutional:      General: She is not in acute distress.    Appearance: She is well-developed. She is not diaphoretic.  Neck:     Thyroid: No thyromegaly.  Cardiovascular:     Rate and Rhythm: Normal rate and regular rhythm.     Pulses: Normal pulses.     Heart sounds: Normal heart sounds.  Pulmonary:     Effort: Pulmonary effort is normal. No respiratory distress.     Breath sounds: Normal breath sounds.  Abdominal:     General:  Bowel sounds are normal. There is no distension.     Palpations: Abdomen is soft.     Tenderness: There is no abdominal tenderness.  Musculoskeletal:        General: Normal range of motion.     Cervical back: Neck supple.     Right lower leg: No edema.     Left lower leg: No edema.  Lymphadenopathy:     Cervical: No cervical adenopathy.  Skin:    General:  Skin is warm and dry.  Neurological:     Mental Status: She is alert. Mental status is at baseline.  Psychiatric:        Mood and Affect: Mood normal.       ASSESSMENT/ PLAN:  TODAY  Acute blood loss anemia Iron deficiency  Will begin iron two times weekly Will repeat hgb/hct    Synthia Innocent NP [facility]   call [phone]

## 2022-10-23 ENCOUNTER — Ambulatory Visit (INDEPENDENT_AMBULATORY_CARE_PROVIDER_SITE_OTHER): Payer: PPO | Admitting: Orthopedic Surgery

## 2022-10-23 ENCOUNTER — Encounter: Payer: Self-pay | Admitting: Orthopedic Surgery

## 2022-10-23 DIAGNOSIS — S72001D Fracture of unspecified part of neck of right femur, subsequent encounter for closed fracture with routine healing: Secondary | ICD-10-CM

## 2022-10-23 NOTE — Progress Notes (Signed)
Postop visit #1  Status post bipolar replacement right hip  Right femoral neck fracture  Chief Complaint  Patient presents with   Post-op Follow-up    Right hip bipolar 10/09/22   Skin incision is clean dry and intact no staples  Leg alignment is normal.  Hip flexion was pain-free.  Patient does have chronic stiff weak right shoulder with poor range of motion most likely rotator cuff tear which is chronic  Follow-up in 4 weeks  Continue Lovenox DVT prevention  Weight-bear as tolerated

## 2022-10-27 DIAGNOSIS — E611 Iron deficiency: Secondary | ICD-10-CM | POA: Insufficient documentation

## 2022-10-28 ENCOUNTER — Other Ambulatory Visit: Payer: Self-pay | Admitting: Adult Health

## 2022-10-28 ENCOUNTER — Encounter: Payer: Self-pay | Admitting: Adult Health

## 2022-10-28 ENCOUNTER — Non-Acute Institutional Stay (SKILLED_NURSING_FACILITY): Payer: PPO | Admitting: Adult Health

## 2022-10-28 DIAGNOSIS — I700 Atherosclerosis of aorta: Secondary | ICD-10-CM | POA: Diagnosis not present

## 2022-10-28 DIAGNOSIS — D62 Acute posthemorrhagic anemia: Secondary | ICD-10-CM

## 2022-10-28 DIAGNOSIS — S72001S Fracture of unspecified part of neck of right femur, sequela: Secondary | ICD-10-CM

## 2022-10-28 DIAGNOSIS — F0390 Unspecified dementia without behavioral disturbance: Secondary | ICD-10-CM | POA: Diagnosis not present

## 2022-10-28 MED ORDER — DONEPEZIL HCL 10 MG PO TABS: 10.0000 mg | ORAL_TABLET | Freq: Every day | ORAL | 0 refills | Status: DC

## 2022-10-28 MED ORDER — ENOXAPARIN SODIUM 40 MG/0.4ML IJ SOSY: 40.0000 mg | PREFILLED_SYRINGE | INTRAMUSCULAR | 0 refills | Status: DC

## 2022-10-28 NOTE — Progress Notes (Signed)
Location:  [facility] Room Number: 128P Place of Service:  SNF (31)   CODE STATUS: FULL CODE  No Known Allergies  Chief Complaint  Patient presents with   Discharge Note    Discharge    HPI:  She is being discharged to home with home health for pt/ot. She will need a wheelchair. She will need her prescriptions written and will need to follow up with her medical provider. She had been hospitalized for a right femur fracture. She was admitted to this facility for short term rehab: bed mobility supervision; stand pivot: contact guard assist; upper body min assist; lower body mod assist; brp: mid to mod assist: st for expression. Ambulate 20-30 feet with min assist. 5 stairs with mod assist.   Past Medical History:  Diagnosis Date   B12 deficiency 06/08/2016   Breast cancer    Breast cancer, right breast 04/25/2013   Cancer    Claustrophobia    Claustrophobia    Dementia    Hypertension    Iron deficiency anemia 12/15/2016   Malignant neoplasm of upper-outer quadrant of RIGHT female breast (HCC) 04/25/2013   Stage IA (T1b, N0, M0) invasive mucinous breast carcinoma, S/P right breast lumpectomy by Dr. Lovell SheehanJenkins on 03/08/2013 with 1 negative sentinel node. Cancer was identified as ER+ 100%, PR+ 85%, Her2 negative, and Ki-67 marker at 17%. Oncotype Dx score of 20 with 13% 10 year risk of distant recurrence and absolute benefit of chemotherapy at 10 years in this risk category is negligible and less than 1%.   Osteoporosis     Past Surgical History:  Procedure Laterality Date   ABDOMINAL HYSTERECTOMY     CATARACT EXTRACTION, BILATERAL     HIP ARTHROPLASTY Right 10/09/2022   Procedure: ARTHROPLASTY BIPOLAR HIP (HEMIARTHROPLASTY);  Surgeon: Vickki HearingHarrison, Stanley E, MD;  Location: [facility];  Service: Orthopedics;  Laterality: Right;   PARTIAL MASTECTOMY WITH NEEDLE LOCALIZATION AND AXILLARY SENTINEL LYMPH NODE BX Right 03/08/2013   Procedure: PARTIAL MASTECTOMY WITH NEEDLE  LOCALIZATION AND AXILLARY SENTINEL LYMPH NODE BX;  Surgeon: Dalia HeadingMark A Jenkins, MD;  Location: [facility];  Service: General;  Laterality: Right;  Sentinel Node Bx @ 7:30am Needle Loc @ 8:00am    Social History   Socioeconomic History   Marital status: Widowed    Spouse name: Not on file   Number of children: Not on file   Years of education: Not on file   Highest education level: Not on file  Occupational History   Not on file  Tobacco Use   Smoking status: Former    Packs/day: 0.50    Years: 40.00    Additional pack years: 0.00    Total pack years: 20.00    Types: Cigarettes    Quit date: 03/03/1967    Years since quitting: 55.6   Smokeless tobacco: Never  Vaping Use   Vaping Use: Never used  Substance and Sexual Activity   Alcohol use: No   Drug use: No   Sexual activity: Yes    Birth control/protection: Surgical  Other Topics Concern   Not on file  Social History Narrative   Not on file   Social Determinants of Health   Financial Resource Strain: Not on file  Food Insecurity: No Food Insecurity (10/08/2022)   Hunger Vital Sign    Worried About Running Out of Food in the Last Year: Never true    Ran Out of Food in the Last Year: Never true  Transportation Needs: No Transportation  Needs (10/08/2022)   PRAPARE - Administrator, Civil Service (Medical): No    Lack of Transportation (Non-Medical): No  Physical Activity: Not on file  Stress: Not on file  Social Connections: Not on file  Intimate Partner Violence: Not At Risk (10/08/2022)   Humiliation, Afraid, Rape, and Kick questionnaire    Fear of Current or Ex-Partner: No    Emotionally Abused: No    Physically Abused: No    Sexually Abused: No   Family History  Problem Relation Age of Onset   Diabetes Mother    Cancer Father    Cancer Sister    Cancer Brother       VITAL SIGNS BP 100/62   Pulse 75   Temp (!) 97.5 F (36.4 C)   Resp 18   Ht  (1.575 m)   Wt 142 lb (64.4 kg)   SpO2 95%    BMI 25.97 kg/m   Outpatient Encounter Medications as of 10/28/2022  Medication Sig   acetaminophen (TYLENOL) 325 MG tablet Take 2 tablets (650 mg total) by mouth every 6 (six) hours as needed for mild pain.   Balsam [place]-Castor Oil (VENELEX) OINT Apply to bilateral buttock,coccyx and sacrum every shift.   calcium-vitamin D (OSCAL WITH D) 500-200 MG-UNIT per tablet Take 2 tablets by mouth daily with breakfast.   cyanocobalamin (VITAMIN B12) 1000 MCG/ML injection Inject 1,000 mcg into the muscle. Once a day on Tuesday.   donepezil (ARICEPT) 10 MG tablet Take 10 mg by mouth daily.   enoxaparin (LOVENOX) 40 MG/0.4ML injection Inject 40 mg into the skin daily.   ferrous sulfate 325 (65 FE) MG tablet Take 325 mg by mouth. Once a day on Monday and Thursday   UNABLE TO FIND Diet - Liquids: Regular Thin Diet: REGULAR   [DISCONTINUED] NEEDLE, DISP, 27 G (BD DISP NEEDLES) 27G X 1/2" MISC 1 Syringe by Does not apply route every 30 (thirty) days.   No facility-administered encounter medications on file as of 10/28/2022.     SIGNIFICANT DIAGNOSTIC EXAMS  TODAY  10-20-22: wbc 6.7;hgb 8.8; hct 27.6; mcv 97.9 plt 318 10-22-22; iron 25; tibc 231  Review of Systems  Constitutional:  Negative for malaise/fatigue.  Respiratory:  Negative for cough and shortness of breath.   Cardiovascular:  Negative for chest pain, palpitations and leg swelling.  Gastrointestinal:  Negative for abdominal pain, constipation and heartburn.  Musculoskeletal:  Negative for back pain, joint pain and myalgias.  Skin: Negative.   Neurological:  Negative for dizziness.  Psychiatric/Behavioral:  The patient is not nervous/anxious.    Physical Exam Constitutional:      General: She is not in acute distress.    Appearance: She is well-developed. She is not diaphoretic.  Neck:     Thyroid: No thyromegaly.  Cardiovascular:     Rate and Rhythm: Normal rate and regular rhythm.     Pulses: Normal pulses.     Heart sounds: Normal  heart sounds.  Pulmonary:     Effort: Pulmonary effort is normal. No respiratory distress.     Breath sounds: Normal breath sounds.  Abdominal:     General: Bowel sounds are normal. There is no distension.     Palpations: Abdomen is soft.     Tenderness: There is no abdominal tenderness.  Musculoskeletal:        General: Normal range of motion.     Cervical back: Neck supple.     Right lower leg: No edema.  Left lower leg: No edema.  Lymphadenopathy:     Cervical: No cervical adenopathy.  Skin:    General: Skin is warm and dry.  Neurological:     Mental Status: She is alert. Mental status is at baseline.  Psychiatric:        Mood and Affect: Mood normal.       ASSESSMENT/ PLAN:   Patient is being discharged with the following home health services:  pt/ot to evaluate and treat as indicated for gait balance strength adl training.   Patient is being discharged with the following durable medical equipment:  wheelchair   Patient has been advised to f/u with their PCP in 1-2 weeks to for a transitions of care visit.  Social services at their facility was responsible for arranging this appointment.  Pt was provided with adequate prescriptions of noncontrolled medications to reach the scheduled appointment .  For controlled substances, a limited supply was provided as appropriate for the individual patient.  If the pt normally receives these medications from a pain clinic or has a contract with another physician, these medications should be received from that clinic or physician only).     A 30 day supply of her prescription medications have been sent to [facility]  Time spent with patient: 40 minutes: medications; dme; home health   Synthia Innocent NP [facility]   call [phone]

## 2022-10-29 ENCOUNTER — Other Ambulatory Visit (HOSPITAL_COMMUNITY)
Admission: RE | Admit: 2022-10-29 | Discharge: 2022-10-29 | Disposition: A | Discharge status: Home / Self Care | Payer: PPO | Source: Skilled Nursing Facility | Attending: Adult Health | Admitting: Adult Health

## 2022-10-29 DIAGNOSIS — D62 Acute posthemorrhagic anemia: Secondary | ICD-10-CM | POA: Insufficient documentation

## 2022-10-29 LAB — HEMOGLOBIN AND HEMATOCRIT, BLOOD
HCT: 28.6 % — ABNORMAL LOW (ref 36.0–46.0)
Hemoglobin: 9.2 g/dL — ABNORMAL LOW (ref 12.0–15.0)

## 2022-11-05 ENCOUNTER — Other Ambulatory Visit (HOSPITAL_COMMUNITY): Payer: Self-pay | Admitting: Hematology

## 2022-11-05 ENCOUNTER — Telehealth: Payer: Self-pay | Admitting: Orthopedic Surgery

## 2022-11-05 ENCOUNTER — Other Ambulatory Visit (HOSPITAL_COMMUNITY): Payer: Self-pay | Admitting: Internal Medicine

## 2022-11-05 DIAGNOSIS — Z13820 Encounter for screening for osteoporosis: Secondary | ICD-10-CM

## 2022-11-05 DIAGNOSIS — Z1231 Encounter for screening mammogram for malignant neoplasm of breast: Secondary | ICD-10-CM

## 2022-11-05 DIAGNOSIS — G309 Alzheimer's disease, unspecified: Secondary | ICD-10-CM | POA: Diagnosis not present

## 2022-11-05 DIAGNOSIS — E1122 Type 2 diabetes mellitus with diabetic chronic kidney disease: Secondary | ICD-10-CM | POA: Diagnosis not present

## 2022-11-05 DIAGNOSIS — I10 Essential (primary) hypertension: Secondary | ICD-10-CM | POA: Diagnosis not present

## 2022-11-05 DIAGNOSIS — R7309 Other abnormal glucose: Secondary | ICD-10-CM | POA: Diagnosis not present

## 2022-11-05 NOTE — Telephone Encounter (Signed)
Dr. Mort Sawyers pt - the patient's daughter Matthias Hughs [phone] lvm stating that the patient came home from the [facility] and they sent her home w/a bag of Lovenox and told her she needed to take it.  She wants to know if she still needs to continue to take this now or not.  She is really confused.

## 2022-11-06 ENCOUNTER — Telehealth: Payer: Self-pay | Admitting: Orthopedic Surgery

## 2022-11-06 DIAGNOSIS — Z87891 Personal history of nicotine dependence: Secondary | ICD-10-CM | POA: Diagnosis not present

## 2022-11-06 DIAGNOSIS — Z96641 Presence of right artificial hip joint: Secondary | ICD-10-CM | POA: Diagnosis not present

## 2022-11-06 DIAGNOSIS — M810 Age-related osteoporosis without current pathological fracture: Secondary | ICD-10-CM | POA: Diagnosis not present

## 2022-11-06 DIAGNOSIS — F0390 Unspecified dementia without behavioral disturbance: Secondary | ICD-10-CM | POA: Diagnosis not present

## 2022-11-06 DIAGNOSIS — E538 Deficiency of other specified B group vitamins: Secondary | ICD-10-CM | POA: Diagnosis not present

## 2022-11-06 DIAGNOSIS — D509 Iron deficiency anemia, unspecified: Secondary | ICD-10-CM | POA: Diagnosis not present

## 2022-11-06 DIAGNOSIS — Z87440 Personal history of urinary (tract) infections: Secondary | ICD-10-CM | POA: Diagnosis not present

## 2022-11-06 DIAGNOSIS — J8410 Pulmonary fibrosis, unspecified: Secondary | ICD-10-CM | POA: Diagnosis not present

## 2022-11-06 DIAGNOSIS — E1159 Type 2 diabetes mellitus with other circulatory complications: Secondary | ICD-10-CM | POA: Diagnosis not present

## 2022-11-06 DIAGNOSIS — S72001D Fracture of unspecified part of neck of right femur, subsequent encounter for closed fracture with routine healing: Secondary | ICD-10-CM | POA: Diagnosis not present

## 2022-11-06 DIAGNOSIS — S72491G Other fracture of lower end of right femur, subsequent encounter for closed fracture with delayed healing: Secondary | ICD-10-CM | POA: Diagnosis not present

## 2022-11-06 DIAGNOSIS — I700 Atherosclerosis of aorta: Secondary | ICD-10-CM | POA: Diagnosis not present

## 2022-11-06 DIAGNOSIS — D62 Acute posthemorrhagic anemia: Secondary | ICD-10-CM | POA: Diagnosis not present

## 2022-11-06 DIAGNOSIS — F40240 Claustrophobia: Secondary | ICD-10-CM | POA: Diagnosis not present

## 2022-11-06 DIAGNOSIS — Z9181 History of falling: Secondary | ICD-10-CM | POA: Diagnosis not present

## 2022-11-06 DIAGNOSIS — Z9011 Acquired absence of right breast and nipple: Secondary | ICD-10-CM | POA: Diagnosis not present

## 2022-11-06 DIAGNOSIS — I152 Hypertension secondary to endocrine disorders: Secondary | ICD-10-CM | POA: Diagnosis not present

## 2022-11-06 DIAGNOSIS — Z853 Personal history of malignant neoplasm of breast: Secondary | ICD-10-CM | POA: Diagnosis not present

## 2022-11-06 NOTE — Telephone Encounter (Signed)
05/03/2 should be the last day of the lovenox Daughter wanted to know how long she would be on it . I called her to advise.

## 2022-11-06 NOTE — Telephone Encounter (Signed)
Amy,   Matthias Hughs called and left voicemail at 12:04 pm wants you to call her back about her mom getting shots.    Please call her back at [phone]

## 2022-11-06 NOTE — Telephone Encounter (Signed)
g of Lovenox and told her she needed to take it.  That would be yes

## 2022-11-06 NOTE — Telephone Encounter (Signed)
Left another message for call back

## 2022-11-06 NOTE — Telephone Encounter (Signed)
Dr. Mort Sawyers pt - per Amy's instructions I read the patient's daughter what the message said, she has additional questions regarding the medication that I can't answer because I'm not clinical.  She would like a call back.

## 2022-11-06 NOTE — Telephone Encounter (Signed)
Called her daughter back

## 2022-11-06 NOTE — Telephone Encounter (Signed)
Called to advise she takes this medication. Left message for her to call back.

## 2022-11-19 DIAGNOSIS — S72001D Fracture of unspecified part of neck of right femur, subsequent encounter for closed fracture with routine healing: Secondary | ICD-10-CM | POA: Diagnosis not present

## 2022-11-19 DIAGNOSIS — Z853 Personal history of malignant neoplasm of breast: Secondary | ICD-10-CM | POA: Diagnosis not present

## 2022-11-19 DIAGNOSIS — F40240 Claustrophobia: Secondary | ICD-10-CM | POA: Diagnosis not present

## 2022-11-19 DIAGNOSIS — D62 Acute posthemorrhagic anemia: Secondary | ICD-10-CM | POA: Diagnosis not present

## 2022-11-19 DIAGNOSIS — E538 Deficiency of other specified B group vitamins: Secondary | ICD-10-CM | POA: Diagnosis not present

## 2022-11-19 DIAGNOSIS — Z87891 Personal history of nicotine dependence: Secondary | ICD-10-CM | POA: Diagnosis not present

## 2022-11-19 DIAGNOSIS — Z9011 Acquired absence of right breast and nipple: Secondary | ICD-10-CM | POA: Diagnosis not present

## 2022-11-19 DIAGNOSIS — Z96641 Presence of right artificial hip joint: Secondary | ICD-10-CM | POA: Diagnosis not present

## 2022-11-19 DIAGNOSIS — I152 Hypertension secondary to endocrine disorders: Secondary | ICD-10-CM | POA: Diagnosis not present

## 2022-11-19 DIAGNOSIS — M810 Age-related osteoporosis without current pathological fracture: Secondary | ICD-10-CM | POA: Diagnosis not present

## 2022-11-19 DIAGNOSIS — I700 Atherosclerosis of aorta: Secondary | ICD-10-CM | POA: Diagnosis not present

## 2022-11-19 DIAGNOSIS — J8410 Pulmonary fibrosis, unspecified: Secondary | ICD-10-CM | POA: Diagnosis not present

## 2022-11-19 DIAGNOSIS — D509 Iron deficiency anemia, unspecified: Secondary | ICD-10-CM | POA: Diagnosis not present

## 2022-11-19 DIAGNOSIS — F0390 Unspecified dementia without behavioral disturbance: Secondary | ICD-10-CM | POA: Diagnosis not present

## 2022-11-19 DIAGNOSIS — Z9181 History of falling: Secondary | ICD-10-CM | POA: Diagnosis not present

## 2022-11-19 DIAGNOSIS — E1159 Type 2 diabetes mellitus with other circulatory complications: Secondary | ICD-10-CM | POA: Diagnosis not present

## 2022-11-20 ENCOUNTER — Other Ambulatory Visit (HOSPITAL_COMMUNITY): Payer: PPO

## 2022-11-20 ENCOUNTER — Encounter: Payer: Self-pay | Admitting: Orthopedic Surgery

## 2022-11-20 ENCOUNTER — Ambulatory Visit (INDEPENDENT_AMBULATORY_CARE_PROVIDER_SITE_OTHER): Payer: PPO | Admitting: Orthopedic Surgery

## 2022-11-20 ENCOUNTER — Ambulatory Visit (HOSPITAL_COMMUNITY): Payer: PPO

## 2022-11-20 DIAGNOSIS — S72001D Fracture of unspecified part of neck of right femur, subsequent encounter for closed fracture with routine healing: Secondary | ICD-10-CM

## 2022-11-20 NOTE — Progress Notes (Signed)
   This is a postop visit  Encounter Diagnosis  Name Primary?   Closed fracture of right hip with routine healing, subsequent encounter bipolar hip replacement 10/09/22 Yes    Chief Complaint  Patient presents with   Post-op Follow-up    Right hip feeling stronger 10/09/22 bipolar replacement /fracture    Status post bipolar replacement right hip for right hip fracture This is postop day number 42  Postop pain control Tylenol  Subjective complaints none  Patient is ambulatory with a walker and standby assist still has some difficulty getting out of a chair sit to stand  Physical exam findings normal alignment of the limb excellent hip flexion  Assessment and plan  Stable postop bipolar replacement right hip for right hip fracture  Return in 6 weeks

## 2022-11-21 ENCOUNTER — Other Ambulatory Visit: Payer: Self-pay

## 2022-11-21 ENCOUNTER — Inpatient Hospital Stay (HOSPITAL_COMMUNITY)
Admission: EM | Admit: 2022-11-21 | Discharge: 2022-11-25 | DRG: 481 | Disposition: A | Discharge status: Skilled Nursing Facility | Payer: PPO | Attending: Family Medicine | Admitting: Family Medicine

## 2022-11-21 ENCOUNTER — Emergency Department (HOSPITAL_COMMUNITY): Payer: PPO

## 2022-11-21 ENCOUNTER — Encounter (HOSPITAL_COMMUNITY): Payer: Self-pay

## 2022-11-21 DIAGNOSIS — S72401A Unspecified fracture of lower end of right femur, initial encounter for closed fracture: Secondary | ICD-10-CM | POA: Diagnosis not present

## 2022-11-21 DIAGNOSIS — Z1152 Encounter for screening for COVID-19: Secondary | ICD-10-CM | POA: Diagnosis not present

## 2022-11-21 DIAGNOSIS — F0390 Unspecified dementia without behavioral disturbance: Secondary | ICD-10-CM | POA: Diagnosis present

## 2022-11-21 DIAGNOSIS — D509 Iron deficiency anemia, unspecified: Secondary | ICD-10-CM | POA: Diagnosis not present

## 2022-11-21 DIAGNOSIS — E1159 Type 2 diabetes mellitus with other circulatory complications: Secondary | ICD-10-CM | POA: Diagnosis present

## 2022-11-21 DIAGNOSIS — W19XXXA Unspecified fall, initial encounter: Secondary | ICD-10-CM | POA: Diagnosis not present

## 2022-11-21 DIAGNOSIS — C50411 Malignant neoplasm of upper-outer quadrant of right female breast: Secondary | ICD-10-CM

## 2022-11-21 DIAGNOSIS — D62 Acute posthemorrhagic anemia: Secondary | ICD-10-CM | POA: Diagnosis not present

## 2022-11-21 DIAGNOSIS — M542 Cervicalgia: Secondary | ICD-10-CM | POA: Diagnosis not present

## 2022-11-21 DIAGNOSIS — C50419 Malignant neoplasm of upper-outer quadrant of unspecified female breast: Secondary | ICD-10-CM | POA: Diagnosis present

## 2022-11-21 DIAGNOSIS — Z833 Family history of diabetes mellitus: Secondary | ICD-10-CM

## 2022-11-21 DIAGNOSIS — Z96642 Presence of left artificial hip joint: Secondary | ICD-10-CM | POA: Diagnosis not present

## 2022-11-21 DIAGNOSIS — S72409A Unspecified fracture of lower end of unspecified femur, initial encounter for closed fracture: Secondary | ICD-10-CM

## 2022-11-21 DIAGNOSIS — Z853 Personal history of malignant neoplasm of breast: Secondary | ICD-10-CM | POA: Diagnosis not present

## 2022-11-21 DIAGNOSIS — Z01818 Encounter for other preprocedural examination: Secondary | ICD-10-CM | POA: Diagnosis not present

## 2022-11-21 DIAGNOSIS — M25551 Pain in right hip: Secondary | ICD-10-CM | POA: Diagnosis not present

## 2022-11-21 DIAGNOSIS — S72491G Other fracture of lower end of right femur, subsequent encounter for closed fracture with delayed healing: Secondary | ICD-10-CM | POA: Diagnosis present

## 2022-11-21 DIAGNOSIS — S7291XA Unspecified fracture of right femur, initial encounter for closed fracture: Secondary | ICD-10-CM | POA: Diagnosis not present

## 2022-11-21 DIAGNOSIS — E538 Deficiency of other specified B group vitamins: Secondary | ICD-10-CM | POA: Diagnosis not present

## 2022-11-21 DIAGNOSIS — I10 Essential (primary) hypertension: Secondary | ICD-10-CM | POA: Diagnosis not present

## 2022-11-21 DIAGNOSIS — Z96641 Presence of right artificial hip joint: Secondary | ICD-10-CM | POA: Diagnosis not present

## 2022-11-21 DIAGNOSIS — N390 Urinary tract infection, site not specified: Secondary | ICD-10-CM | POA: Diagnosis present

## 2022-11-21 DIAGNOSIS — Z9181 History of falling: Secondary | ICD-10-CM | POA: Diagnosis not present

## 2022-11-21 DIAGNOSIS — Z79899 Other long term (current) drug therapy: Secondary | ICD-10-CM | POA: Diagnosis not present

## 2022-11-21 DIAGNOSIS — M6281 Muscle weakness (generalized): Secondary | ICD-10-CM | POA: Diagnosis not present

## 2022-11-21 DIAGNOSIS — Z87891 Personal history of nicotine dependence: Secondary | ICD-10-CM

## 2022-11-21 DIAGNOSIS — W1830XA Fall on same level, unspecified, initial encounter: Secondary | ICD-10-CM | POA: Diagnosis present

## 2022-11-21 DIAGNOSIS — S72491A Other fracture of lower end of right femur, initial encounter for closed fracture: Secondary | ICD-10-CM | POA: Diagnosis not present

## 2022-11-21 DIAGNOSIS — M810 Age-related osteoporosis without current pathological fracture: Secondary | ICD-10-CM | POA: Diagnosis present

## 2022-11-21 DIAGNOSIS — E119 Type 2 diabetes mellitus without complications: Secondary | ICD-10-CM | POA: Diagnosis not present

## 2022-11-21 DIAGNOSIS — I152 Hypertension secondary to endocrine disorders: Secondary | ICD-10-CM | POA: Diagnosis present

## 2022-11-21 DIAGNOSIS — Z809 Family history of malignant neoplasm, unspecified: Secondary | ICD-10-CM | POA: Diagnosis not present

## 2022-11-21 DIAGNOSIS — S72351A Displaced comminuted fracture of shaft of right femur, initial encounter for closed fracture: Secondary | ICD-10-CM | POA: Diagnosis not present

## 2022-11-21 DIAGNOSIS — R262 Difficulty in walking, not elsewhere classified: Secondary | ICD-10-CM | POA: Diagnosis not present

## 2022-11-21 DIAGNOSIS — R519 Headache, unspecified: Secondary | ICD-10-CM | POA: Diagnosis not present

## 2022-11-21 DIAGNOSIS — S72301A Unspecified fracture of shaft of right femur, initial encounter for closed fracture: Secondary | ICD-10-CM | POA: Diagnosis not present

## 2022-11-21 DIAGNOSIS — S72401D Unspecified fracture of lower end of right femur, subsequent encounter for closed fracture with routine healing: Secondary | ICD-10-CM | POA: Diagnosis not present

## 2022-11-21 DIAGNOSIS — E118 Type 2 diabetes mellitus with unspecified complications: Secondary | ICD-10-CM | POA: Diagnosis not present

## 2022-11-21 DIAGNOSIS — I700 Atherosclerosis of aorta: Secondary | ICD-10-CM | POA: Diagnosis not present

## 2022-11-21 DIAGNOSIS — R600 Localized edema: Secondary | ICD-10-CM | POA: Diagnosis not present

## 2022-11-21 DIAGNOSIS — Z4789 Encounter for other orthopedic aftercare: Secondary | ICD-10-CM | POA: Diagnosis not present

## 2022-11-21 DIAGNOSIS — S72001D Fracture of unspecified part of neck of right femur, subsequent encounter for closed fracture with routine healing: Secondary | ICD-10-CM | POA: Diagnosis not present

## 2022-11-21 DIAGNOSIS — R488 Other symbolic dysfunctions: Secondary | ICD-10-CM | POA: Diagnosis not present

## 2022-11-21 DIAGNOSIS — Z170 Estrogen receptor positive status [ER+]: Secondary | ICD-10-CM | POA: Diagnosis not present

## 2022-11-21 DIAGNOSIS — Z471 Aftercare following joint replacement surgery: Secondary | ICD-10-CM | POA: Diagnosis not present

## 2022-11-21 DIAGNOSIS — S72351D Displaced comminuted fracture of shaft of right femur, subsequent encounter for closed fracture with routine healing: Secondary | ICD-10-CM | POA: Diagnosis not present

## 2022-11-21 DIAGNOSIS — J8410 Pulmonary fibrosis, unspecified: Secondary | ICD-10-CM | POA: Diagnosis not present

## 2022-11-21 DIAGNOSIS — R2681 Unsteadiness on feet: Secondary | ICD-10-CM | POA: Diagnosis not present

## 2022-11-21 LAB — CBC WITH DIFFERENTIAL/PLATELET
Abs Immature Granulocytes: 0.08 10*3/uL — ABNORMAL HIGH (ref 0.00–0.07)
Basophils Absolute: 0 10*3/uL (ref 0.0–0.1)
Basophils Relative: 0 %
Eosinophils Absolute: 0 10*3/uL (ref 0.0–0.5)
Eosinophils Relative: 0 %
HCT: 32.5 % — ABNORMAL LOW (ref 36.0–46.0)
Hemoglobin: 10.4 g/dL — ABNORMAL LOW (ref 12.0–15.0)
Immature Granulocytes: 1 %
Lymphocytes Relative: 8 %
Lymphs Abs: 0.9 10*3/uL (ref 0.7–4.0)
MCH: 30.1 pg (ref 26.0–34.0)
MCHC: 32 g/dL (ref 30.0–36.0)
MCV: 94.2 fL (ref 80.0–100.0)
Monocytes Absolute: 1 10*3/uL (ref 0.1–1.0)
Monocytes Relative: 8 %
Neutro Abs: 9.7 10*3/uL — ABNORMAL HIGH (ref 1.7–7.7)
Neutrophils Relative %: 83 %
Platelets: 339 10*3/uL (ref 150–400)
RBC: 3.45 MIL/uL — ABNORMAL LOW (ref 3.87–5.11)
RDW: 14.1 % (ref 11.5–15.5)
WBC: 11.7 10*3/uL — ABNORMAL HIGH (ref 4.0–10.5)
nRBC: 0 % (ref 0.0–0.2)

## 2022-11-21 LAB — PROTIME-INR
INR: 1.1 (ref 0.8–1.2)
Prothrombin Time: 13.9 seconds (ref 11.4–15.2)

## 2022-11-21 LAB — BASIC METABOLIC PANEL
Anion gap: 7 (ref 5–15)
BUN: 19 mg/dL (ref 8–23)
CO2: 27 mmol/L (ref 22–32)
Calcium: 8.3 mg/dL — ABNORMAL LOW (ref 8.9–10.3)
Chloride: 104 mmol/L (ref 98–111)
Creatinine, Ser: 0.96 mg/dL (ref 0.44–1.00)
GFR, Estimated: 58 mL/min — ABNORMAL LOW (ref >60)
Glucose, Bld: 143 mg/dL — ABNORMAL HIGH (ref 70–99)
Potassium: 4.1 mmol/L (ref 3.5–5.1)
Sodium: 138 mmol/L (ref 135–145)

## 2022-11-21 LAB — APTT: aPTT: 29 seconds (ref 24–36)

## 2022-11-21 MED ORDER — ENSURE ENLIVE PO LIQD
237.0000 mL | Freq: Two times a day (BID) | ORAL | Status: DC
  Administered 2022-11-22 – 2022-11-24 (×4): 237 mL via ORAL

## 2022-11-21 MED ORDER — OYSTER SHELL CALCIUM/D3 500-5 MG-MCG PO TABS
2.0000 | ORAL_TABLET | Freq: Every day | ORAL | Status: DC
  Administered 2022-11-23 – 2022-11-25 (×3): 2 via ORAL
  Filled 2022-11-21 (×4): qty 2

## 2022-11-21 MED ORDER — ADULT MULTIVITAMIN W/MINERALS CH
1.0000 | ORAL_TABLET | Freq: Every day | ORAL | Status: DC
  Administered 2022-11-21 – 2022-11-25 (×4): 1 via ORAL
  Filled 2022-11-21 (×5): qty 1

## 2022-11-21 MED ORDER — HYDROMORPHONE HCL 1 MG/ML IJ SOLN
0.5000 mg | Freq: Once | INTRAMUSCULAR | Status: AC
  Administered 2022-11-21: 0.5 mg via INTRAVENOUS
  Filled 2022-11-21: qty 0.5

## 2022-11-21 MED ORDER — CEFAZOLIN SODIUM-DEXTROSE 2-4 GM/100ML-% IV SOLN
2.0000 g | INTRAVENOUS | Status: AC
  Administered 2022-11-22: 2 g via INTRAVENOUS
  Filled 2022-11-21: qty 100

## 2022-11-21 MED ORDER — DONEPEZIL HCL 5 MG PO TABS
10.0000 mg | ORAL_TABLET | Freq: Every day | ORAL | Status: DC
  Administered 2022-11-21 – 2022-11-24 (×4): 10 mg via ORAL
  Filled 2022-11-21 (×4): qty 2

## 2022-11-21 MED ORDER — HYDROCODONE-ACETAMINOPHEN 5-325 MG PO TABS
1.0000 | ORAL_TABLET | Freq: Four times a day (QID) | ORAL | Status: DC | PRN
  Administered 2022-11-21 – 2022-11-23 (×4): 1 via ORAL
  Administered 2022-11-24: 2 via ORAL
  Filled 2022-11-21 (×4): qty 1
  Filled 2022-11-21: qty 2

## 2022-11-21 MED ORDER — TRANEXAMIC ACID-NACL 1000-0.7 MG/100ML-% IV SOLN
1000.0000 mg | INTRAVENOUS | Status: AC
  Administered 2022-11-22: 1000 mg via INTRAVENOUS
  Filled 2022-11-21: qty 100

## 2022-11-21 MED ORDER — FERROUS SULFATE 325 (65 FE) MG PO TABS
325.0000 mg | ORAL_TABLET | ORAL | Status: DC
  Administered 2022-11-23: 325 mg via ORAL
  Filled 2022-11-21: qty 1

## 2022-11-21 MED ORDER — HYDROMORPHONE HCL 1 MG/ML IJ SOLN: 0.5000 mg | INTRAMUSCULAR | Status: DC | PRN

## 2022-11-21 NOTE — Progress Notes (Signed)
Initial Nutrition Assessment  DOCUMENTATION CODES:   Not applicable  INTERVENTION:   -Ensure Enlive po BID, each supplement provides 350 kcal and 20 grams of protein -MVI with minerals daily  NUTRITION DIAGNOSIS:   Increased nutrient needs related to post-op healing as evidenced by estimated needs.  GOAL:   Patient will meet greater than or equal to 90% of their needs  MONITOR:   PO intake, Supplement acceptance  REASON FOR ASSESSMENT:   Consult Hip fracture protocol  ASSESSMENT:   Pt with history of dementia, diabetes mellitus, hypertension, right breast cancer, and B12 deficiency presenting after an unwitnessed fall with right leg pain  Pt admitted with rt femur fracture.   Pt unavailable at time of visit. Attempted to speak with pt via call to hospital room phone, however, unable to reach. RD unable to obtain further nutrition-related history or complete nutrition-focused physical exam at this time.    Pt underwent rt hip hemiarthroplasty approximately 2 months ago. Pt now with femur fracture. Per orthopedics notes, plan for tight retrograde femoral nail and possible lateral plate to span the fracture and/or gap between implants. Pt will be NPO at midnight in anticipation for procedure tomorrow (11/22/22).   Pt currently on a regular diet. No meal completions data available to assess at this time.   Reviewed wt hx; pt has experienced a 1.4% wt loss over the past month, which is not significant for time frame.   Pt with increased nutritional needs foe post-op healing and would benefit from addition of oral nutrition supplements.   Medications reviewed and include ferrous sulfate.   Labs reviewed.  Diet Order:   Diet Order             Diet NPO time specified  Diet effective midnight           Diet regular Room service appropriate? Yes; Fluid consistency: Thin  Diet effective now                   EDUCATION NEEDS:   No education needs have been  identified at this time  Skin:  Skin Assessment: Reviewed RN Assessment  Last BM:  Unknown  Height:   Ht Readings from Last 1 Encounters:  11/21/22 5\' 2"  (1.575 m)    Weight:   Wt Readings from Last 1 Encounters:  11/21/22 63.5 kg    Ideal Body Weight:  50 kg  BMI:  Body mass index is 25.61 kg/m.  Estimated Nutritional Needs:   Kcal:  1600-1800  Protein:  80-95 grams  Fluid:  > 1.6 L    Levada Schilling, RD, LDN, CDCES Registered Dietitian II Certified Diabetes Care and Education Specialist Please refer to [facility] for RD and/or RD on-call/weekend/after hours pager

## 2022-11-21 NOTE — ED Notes (Signed)
Patient transported to CT 

## 2022-11-21 NOTE — Consult Note (Signed)
ORTHOPAEDIC CONSULTATION  REQUESTING PHYSICIAN: Tat, Onalee Hua, MD  ASSESSMENT AND PLAN: 89 y.o. female with the following: Right distal femur fracture; distal to right hip hemiarthroplasty  This patient requires inpatient admission to the hospitalist, to include preoperative clearance and perioperative medical management  - Weight Bearing Status/Activity: NWB Right lower extremity  - Additional recommended labs/tests: Preop Labs: CBC, BMP, PT/INR, Chest XR, and EKG  -VTE Prophylaxis: Please hold prior to OR; to resume POD#1 at the discretion of the primary team  - Pain control: Recommend PO pain medications PRN; judicious use of narcotics  - Follow-up plan: F/u 10-14 days postop  -Procedures: Plan for OR once patient has been medically optimized  Plan for Right retrograde femoral nail; possible lateral plate to span the fracture and/or gap between implants  N.p.o. midnight.     Chief Complaint: Right leg pain  HPI: Tamara [name] is a 89 y.o. female who presented to the ED for evaluation after sustaining a mechanical fall.   She has dementia, and is confused.  Most this information is gleaned from the chart, and in discussion with providers as well as the patient's family.  No family was available at the bedside during my evaluation.  Patient is recent undergone right hip hemiarthroplasty with Dr. Romeo Apple, less than 2 months ago.  She saw him in clinic yesterday.  She was progressing appropriately.  She has been using a walker.  She ambulated to the bathroom without assistance, and subsequently fell.  She had immediate pain.  She presented to the ED via EMS.  Pain is in the right leg.  Pain gets worse with movement.  No pain elsewhere.  She is not complaining of any pain or numbness or tingling.  She did not hit her head.  She was taking Lovenox until yesterday.  Past Medical History:  Diagnosis Date   B12 deficiency 06/08/2016   Breast cancer (HCC)    Breast cancer, right  breast (HCC) 04/25/2013   Cancer (HCC)    Claustrophobia    Claustrophobia    Dementia (HCC)    Hypertension    Iron deficiency anemia 12/15/2016   Malignant neoplasm of upper-outer quadrant of RIGHT female breast (HCC) 04/25/2013   Stage IA (T1b, N0, M0) invasive mucinous breast carcinoma, S/P right breast lumpectomy by Dr. Lovell Sheehan on 03/08/2013 with 1 negative sentinel node. Cancer was identified as ER+ 100%, PR+ 85%, Her2 negative, and Ki-67 marker at 17%. Oncotype Dx score of 20 with 13% 10 year risk of distant recurrence and absolute benefit of chemotherapy at 10 years in this risk category is negligible and less than 1%.   Osteoporosis    Past Surgical History:  Procedure Laterality Date   ABDOMINAL HYSTERECTOMY     CATARACT EXTRACTION, BILATERAL     HIP ARTHROPLASTY Right 10/09/2022   Procedure: ARTHROPLASTY BIPOLAR HIP (HEMIARTHROPLASTY);  Surgeon: Vickki Hearing, MD;  Location: [facility];  Service: Orthopedics;  Laterality: Right;   PARTIAL MASTECTOMY WITH NEEDLE LOCALIZATION AND AXILLARY SENTINEL LYMPH NODE BX Right 03/08/2013   Procedure: PARTIAL MASTECTOMY WITH NEEDLE LOCALIZATION AND AXILLARY SENTINEL LYMPH NODE BX;  Surgeon: Dalia Heading, MD;  Location: [facility];  Service: General;  Laterality: Right;  Sentinel Node Bx @ 7:30am Needle Loc @ 8:00am   Social History   Socioeconomic History   Marital status: Widowed    Spouse name: Not on file   Number of children: Not on file   Years of education: Not on file   Highest  education level: Not on file  Occupational History   Not on file  Tobacco Use   Smoking status: Former    Packs/day: 0.50    Years: 40.00    Additional pack years: 0.00    Total pack years: 20.00    Types: Cigarettes    Quit date: 03/03/1967    Years since quitting: 55.7   Smokeless tobacco: Never  Vaping Use   Vaping Use: Never used  Substance and Sexual Activity   Alcohol use: No   Drug use: No   Sexual activity: Yes    Birth control/protection:  Surgical  Other Topics Concern   Not on file  Social History Narrative   Not on file   Social Determinants of Health   Financial Resource Strain: Not on file  Food Insecurity: No Food Insecurity (10/08/2022)   Hunger Vital Sign    Worried About Running Out of Food in the Last Year: Never true    Ran Out of Food in the Last Year: Never true  Transportation Needs: No Transportation Needs (10/08/2022)   PRAPARE - Administrator, Civil Service (Medical): No    Lack of Transportation (Non-Medical): No  Physical Activity: Not on file  Stress: Not on file  Social Connections: Not on file   Family History  Problem Relation Age of Onset   Diabetes Mother    Cancer Father    Cancer Sister    Cancer Brother    No Known Allergies Prior to Admission medications   Medication Sig Start Date End Date Taking? Authorizing Provider  acetaminophen (TYLENOL) 325 MG tablet Take 2 tablets (650 mg total) by mouth every 6 (six) hours as needed for mild pain. 10/14/22  Yes Tyrone Nine, MD  calcium-vitamin D (OSCAL WITH D) 500-200 MG-UNIT per tablet Take 2 tablets by mouth daily with breakfast. 04/25/13  Yes Kefalas, Maurine Minister, PA-C  donepezil (ARICEPT) 10 MG tablet Take 1 tablet (10 mg total) by mouth daily. 10/28/22  Yes Sharee Holster, NP  enoxaparin (LOVENOX) 40 MG/0.4ML injection Inject 0.4 mLs (40 mg total) into the skin daily for 20 days. 10/31/22 11/21/22 Yes Sharee Holster, NP  cyanocobalamin (VITAMIN B12) 1000 MCG/ML injection Inject 1,000 mcg into the muscle. Once a day on Tuesday. Patient not taking: Reported on 11/21/2022    [provider]  ferrous sulfate 325 (65 FE) MG tablet Take 325 mg by mouth. Once a day on Monday and Thursday Patient not taking: Reported on 11/21/2022    [provider]  UNABLE TO FIND Diet - Liquids: Regular Thin Diet: REGULAR    [provider]   DG Chest Portable 1 View  Result Date: 11/21/2022 CLINICAL DATA:  Preop.  Right hip  fracture. EXAM: PORTABLE CHEST 1 VIEW COMPARISON:  10/10/2022. FINDINGS: Cardiac silhouette mildly enlarged.  No mediastinal or hilar masses. Bilateral interstitial thickening and areas of scarring, stable from the prior exam. No evidence of pneumonia or pulmonary edema. No convincing pleural effusion or pneumothorax. Skeletal structures are diffusely demineralized. IMPRESSION: No acute cardiopulmonary disease. Electronically Signed   By: Amie Portland M.D.   On: 11/21/2022 11:42   CT Cervical Spine Wo Contrast  Result Date: 11/21/2022 CLINICAL DATA:  89 year old female status post fall walking to bathroom. Found down. Pain. EXAM: CT CERVICAL SPINE WITHOUT CONTRAST TECHNIQUE: Multidetector CT imaging of the cervical spine was performed without intravenous contrast. Multiplanar CT image reconstructions were also generated. RADIATION DOSE REDUCTION: This exam was performed according  to the departmental dose-optimization program which includes automated exposure control, adjustment of the mA and/or kV according to patient size and/or use of iterative reconstruction technique. COMPARISON:  Head CT today.  No prior cervical spine. FINDINGS: Alignment: Maintained cervical lordosis. Subtle degenerative appearing anterolisthesis of C6 on C7. Cervicothoracic junction alignment is within normal limits. Bilateral posterior element alignment is within normal limits. Skull base and vertebrae: Osteopenia. Visualized skull base is intact. No atlanto-occipital dissociation. C1 and C2 appear intact and aligned. No acute osseous abnormality identified. Soft tissues and spinal canal: No prevertebral fluid or swelling. No visible canal hematoma. Calcified carotid atherosclerosis in the neck. Otherwise negative visible noncontrast neck soft tissues. Disc levels: Mild for age cervical spine degeneration and capacious spinal canal at most levels. Upper chest: Osteopenia. Grossly intact visible upper thoracic levels. Lung apices are  clear. IMPRESSION: 1. No acute traumatic injury identified in the cervical spine. 2. Osteopenia. Mild for age cervical spine degeneration. Electronically Signed   By: Odessa Fleming M.D.   On: 11/21/2022 10:43   DG Femur Min 2 Views Right  Result Date: 11/21/2022 CLINICAL DATA:  89 year old female status post fall walking to bathroom. Found down. Pain. Status post right hip fracture and ORIF in March. EXAM: RIGHT FEMUR 2 VIEWS COMPARISON:  Preoperative right hip series 10/08/2022. postoperative pelvis radiographs 10/09/2022. FINDINGS: Four views of the right femur. The right hip arthroplasty, femoral stem appears stable and intact. But there is a highly comminuted femoral shaft fracture in the distal 3rd, beginning about fiber 6 cm distal to the implant. One full shaft width posterior displacement with pronounced posterior angulation. Displaced 8 cm butterfly fragment. Mild medial angulation. Grossly maintained right knee joint alignment with severe joint space loss. IMPRESSION: Acutely comminuted, displaced and angulated fracture of the distal 3rd right femoral shaft, 5-6 cm distal to the proximal right femoral implant. Electronically Signed   By: Odessa Fleming M.D.   On: 11/21/2022 10:40   CT Head Wo Contrast  Result Date: 11/21/2022 CLINICAL DATA:  89 year old female status post fall walking to bathroom. Found down. Pain. EXAM: CT HEAD WITHOUT CONTRAST TECHNIQUE: Contiguous axial images were obtained from the base of the skull through the vertex without intravenous contrast. RADIATION DOSE REDUCTION: This exam was performed according to the departmental dose-optimization program which includes automated exposure control, adjustment of the mA and/or kV according to patient size and/or use of iterative reconstruction technique. COMPARISON:  Head CT 10/08/2022. FINDINGS: Brain: No midline shift, mass effect, or evidence of intracranial mass lesion. No ventriculomegaly. Chronic anterior temporal lobe atrophy more pronounced  on the left (series 2, image 11). Stable cerebral volume. Stable gray-white matter differentiation throughout the brain. Patchy up to moderate bilateral periventricular white matter hypodensity. No acute intracranial hemorrhage identified. No cortically based acute infarct identified. Vascular: Calcified atherosclerosis at the skull base. No suspicious intracranial vascular hyperdensity. Skull: Stable. Osteopenia. No acute osseous abnormality identified. Sinuses/Orbits: Visualized paranasal sinuses and mastoids are stable and well aerated. Other: No orbit or scalp soft tissue injury identified. IMPRESSION: 1. No acute intracranial abnormality or acute traumatic injury identified. 2. Stable non contrast CT appearance of temporal lobe atrophy, white matter disease. Cerebral Atrophy (ICD10-G31.9). Electronically Signed   By: Odessa Fleming M.D.   On: 11/21/2022 10:38   Family History Reviewed and non-contributory, no pertinent history of problems with bleeding or anesthesia    Review of Systems Unable to assess due to the patient's dementia.   OBJECTIVE  Vitals:Patient Vitals for the past  8 hrs:  BP Temp Temp src Pulse Resp SpO2 Height Weight  11/21/22 1340 131/75 98.1 F (36.7 C) -- 92 18 99 % -- --  11/21/22 1312 124/63 97.7 F (36.5 C) Oral 95 19 100 % -- --  11/21/22 1300 -- -- -- 96 20 100 % -- --  11/21/22 1245 -- -- -- (!) 102 20 100 % -- --  11/21/22 1230 -- -- -- 88 17 95 % -- --  11/21/22 1145 -- -- -- 97 18 98 % -- --  11/21/22 1130 -- -- -- 85 13 97 % -- --  11/21/22 1115 -- -- -- 81 15 98 % -- --  11/21/22 1000 (!) 111/59 -- -- 69 -- 92 % -- --  11/21/22 0925 -- -- -- -- -- -- 5\' 2"  (1.575 m) 63.5 kg  11/21/22 0918 128/72 97.7 F (36.5 C) Oral 69 16 98 % -- --   General: Alert, no acute distress Cardiovascular: Extremities are warm Respiratory: No cyanosis, no use of accessory musculature Skin: No lesions in the area of chief complaint  Neurologic: Response to light touch  distally. Psychiatric: Patient is confused.  She does not respond to questioning appropriately. Lymphatic: No swelling obvious and reported other than the area involved in the exam below Extremities  RLE: Extremity held in a fixed position.  ROM deferred due to known fracture.  2+ DP pulse.  Knee immobilizer fitting appropriately.  There is swelling in the distal thigh.  No obvious bruising.  No open lesions.  She responds to light touch over the dorsum of her foot.  Active motion of the ankle and the great toe was witnessed.  LLE: Left leg without acute injuries.  No swelling.  She responds light touch over the dorsum of her foot.  She is actively dorsiflexing her ankle and her great toe.  2+ DP pulse.    Test Results Imaging XR of the right femur demonstrates a distal 1/3 shaft fracture, with comminution.  There is a right hip hemiarthroplasty in stable position, without evidence of loosening.  Fracture lines do not extend to the tip of the implant.  There are some shortening, as well as displacement of the distal femur fracture.  Advanced degenerative changes noted within the right knee.  Labs cbc Recent Labs    11/21/22 1111  WBC 11.7*  HGB 10.4*  HCT 32.5*  PLT 339     Labs coag Recent Labs    11/21/22 1111  INR 1.1    Recent Labs    11/21/22 1111  NA 138  K 4.1  CL 104  CO2 27  GLUCOSE 143*  BUN 19  CREATININE 0.96  CALCIUM 8.3*

## 2022-11-21 NOTE — ED Provider Notes (Signed)
[place] EMERGENCY DEPARTMENT AT [facility] Provider Note   CSN: [id number] Arrival date & time: 11/21/22  7829     History  Chief Complaint  Patient presents with   Tamara [name] is a 89 y.o. female. She presents from home for c/o fall. She has PMHx of dementia, breast cancer, osteoporosis and had fall with R hip fx on 10/08/22 s/p bipolar hip replacement on 10/09/22.  Pt is not able to provide history due to her dementia, history taken from daughter who is at bedside.  Pt got up with her walker without assistance and fell to the ground, fall was not witness, but she was on her right side and c/o R hip pain   Fall       Home Medications Prior to Admission medications   Medication Sig Start Date End Date Taking? Authorizing Provider  acetaminophen (TYLENOL) 325 MG tablet Take 2 tablets (650 mg total) by mouth every 6 (six) hours as needed for mild pain. 10/14/22   Tyrone Nine, MD  Balsam [place] [facility]) OINT Apply to bilateral buttock,coccyx and sacrum every shift.    [provider]  calcium-vitamin D (OSCAL WITH D) 500-200 MG-UNIT per tablet Take 2 tablets by mouth daily with breakfast. 04/25/13   Ellouise Newer, PA-C  cyanocobalamin (VITAMIN B12) 1000 MCG/ML injection Inject 1,000 mcg into the muscle. Once a day on Tuesday.    [provider]  donepezil (ARICEPT) 10 MG tablet Take 1 tablet (10 mg total) by mouth daily. 10/28/22   Sharee Holster, NP  enoxaparin (LOVENOX) 40 MG/0.4ML injection Inject 0.4 mLs (40 mg total) into the skin daily for 20 days. 10/31/22 11/20/22  Sharee Holster, NP  ferrous sulfate 325 (65 FE) MG tablet Take 325 mg by mouth. Once a day on Monday and Thursday    [provider]  UNABLE TO FIND Diet - Liquids: Regular Thin Diet: REGULAR    [provider]      Allergies    Patient has no known allergies.    Review of Systems   Review of Systems  Physical Exam Updated Vital  Signs BP 128/72 (BP Location: Left Arm)   Pulse 69   Temp 97.7 F (36.5 C) (Oral)   Resp 16   SpO2 98%  Physical Exam Constitutional:      Appearance: Normal appearance.  HENT:     Head: Normocephalic and atraumatic.     Nose: Nose normal.     Mouth/Throat:     Mouth: Mucous membranes are moist.  Eyes:     Extraocular Movements: Extraocular movements intact.     Pupils: Pupils are equal, round, and reactive to light.  Cardiovascular:     Rate and Rhythm: Normal rate and regular rhythm.  Pulmonary:     Effort: No respiratory distress.     Breath sounds: Normal breath sounds.  Abdominal:     General: There is no distension.     Palpations: Abdomen is soft.  Musculoskeletal:     Cervical back: No tenderness.     Thoracic back: No deformity or tenderness.     Lumbar back: No deformity or tenderness.     Comments: No deformity or tenderness around her right hip.  Right thigh has swelling and pain to the distal one third of the thigh, no crepitus, no calf swelling or tenderness. Patient has intact DP and PT pulses, sensation intact in lower extremity to light touch.  Skin:    General: Skin is warm and dry.  Neurological:     Mental Status: She is alert. Mental status is at baseline.     Motor: No weakness.     ED Results / Procedures / Treatments   Labs (all labs ordered are listed, but only abnormal results are displayed) Labs Reviewed - No data to display  EKG None  Radiology No results found.  Procedures .Ortho Injury Treatment  Date/Time: 11/21/2022 1:48 PM  Performed by: Ma Rings, PA-C Authorized by: Ma Rings, PA-C   Consent:    Consent obtained:  Verbal   Consent given by:  Healthcare agent   Risks discussed:  Nerve damage, restricted joint movement and vascular damageInjury location: upper leg Location details: right upper leg Pre-procedure distal perfusion: normal Pre-procedure neurological function: normal Pre-procedure range of  motion: reduced  Anesthesia: Local anesthesia used: no  Patient sedated: NoImmobilization: brace Splint Applied by: ED Provider Supplies used: knee immobilizer. Post-procedure neurovascular assessment: post-procedure neurovascularly intact Post-procedure distal perfusion: normal Post-procedure neurological function: normal Post-procedure range of motion: unchanged       Medications Ordered in ED Medications - No data to display  ED Course/ Medical Decision Making/ A&P                             Medical Decision Making This patient presents to the ED for concern of fall with right leg pain, this involves an extensive number of treatment options, and is a complaint that carries with it a high risk of complications and morbidity.  The differential diagnosis includes fracture, contusion, dislocation, intracranial hemorrhage, other   Co morbidities that complicate the patient evaluation  Dementia   Additional history obtained:  Additional history obtained from EMR External records from outside source obtained and reviewed including orthopedic note from yesterday postop follow-up of right hip replacement   Lab Tests:  I Ordered, and personally interpreted labs.  The pertinent results include: CBC BMP and coags are reassuring.  Hemoglobin of 10.4 is at her baseline.   Imaging Studies ordered:  I ordered imaging studies including the head and C-spine, right femur x-ray I independently visualized and interpreted imaging which showed the head and C-spine no acute injuries, no intracranial hemorrhage, right femur x-ray shows a comminuted distal femur fracture with angulation I agree with the radiologist interpretation   Cardiac Monitoring: / EKG:  The patient was maintained on a cardiac monitor.  I personally viewed and interpreted the cardiac monitored which showed an underlying rhythm of: sinus rhythm   Consultations Obtained:  I requested consultation with the  orthopedic surgeon Dr. Dallas Schimke,  and discussed lab and imaging findings as well as pertinent plan - they recommend:  Plan for surgery tomorrow, around 9am.  Please have her admitted.  Hold DVT Ppx until after surgery. He will come and see her this afternoon.  Discussed with Dr. Arbutus Leas who will admit to hospitalist service   Problem List / ED Course / Critical interventions / Medication management  Presents for right leg pain after a fall, she has swelling to the right distal thigh with good pulses, right leg slightly shortened.  X-ray shows a comminuted distal femur fracture.  Discussed with Dr. Dallas Schimke as above.  They will plan to operate around 9 AM.  Of note patient has been on Lovenox, and last injection last night around 7 PM per her daughter.  Right leg put in knee immobilizer  with some improvement of the shortening of the extremity. I ordered medication including Dilaudid for pain Reevaluation of the patient after these medicines showed that the patient improved I have reviewed the patients home medicines and have made adjustments as needed   Social Determinants of Health:  She has dementia, lives at home with her daughter      Amount and/or Complexity of Data Reviewed Labs: ordered. Radiology: ordered.  Risk Prescription drug management. Decision regarding hospitalization.           Final Clinical Impression(s) / ED Diagnoses Final diagnoses:  None    Rx / DC Orders ED Discharge Orders     None         Ma Rings, PA-C 11/21/22 1350    Cathren Laine, MD 11/22/22 [phone]

## 2022-11-21 NOTE — ED Notes (Signed)
Pillow support placed  for comfort, ice pack to right  leg.

## 2022-11-21 NOTE — ED Notes (Signed)
EDP at bedside during triage 

## 2022-11-21 NOTE — ED Triage Notes (Signed)
Pt lives with her daughter who is POA . Pt was walking to the bathroom with her walker and fell . Pt was found right in front of bathroom door. Pt c/o right upper leg and knee pain. EMS did 20 G IV

## 2022-11-21 NOTE — ED Notes (Signed)
External Catheter has been placed due to a fractured femur. Recommended by PA Cristi Loron and Nurse [place]. Please see X-ray images if there is any questions regarding this external catheter placement.

## 2022-11-21 NOTE — H&P (Signed)
History and Physical    Patient: Tamara [name] ZOX:[id number] DOB: 1934-06-05 DOA: 11/21/2022 DOS: the patient was seen and examined on 11/21/2022 PCP: [name], MD  Patient coming from: Home  Chief Complaint:  Chief Complaint  Patient presents with   Fall   HPI: KALIYA REDDIN is a 89 year old female with a history of dementia, diabetes mellitus, hypertension, right breast cancer, and B12 deficiency presenting after an unwitnessed fall with right leg pain.  The patient had a recent hospital admission from 10/08/2022 to 10/14/2022 when she had an unwitnessed fall resulting in right femoral neck fracture.  She underwent a right bipolar hip arthroplasty on 10/09/2022.  She was subsequently discharged to [facility] for STR.  She was subsequently discharged back home from STR on 10/28/2022.  Since discharge home, the patient has been able to ambulate with a walker.  She does require assistance with transfers.  According to her daughter, it appears that the amount of assistance is minimal.  However, it has been recommended that the patient not get up by herself.  Nevertheless, the patient got up early in the morning on 11/21/2022 to go to the bathroom and by herself.  She sustained a fall onto her right side and was not able to get up.  As result, EMS was activated.  There was no syncope or loss of consciousness.  The patient was awake and alert when the patient's daughter found her.  Daughter states that since the patient's last hospitalization, the patient has had more sundowning episodes and has had more confusion overall. In the ED, the patient was afebrile and hemodynamically stable with oxygen saturation 97% room air.  WBC 11.7, hemoglobin 10.4, platelets 239,000.  Sodium 138, potassium 4.1, bicarbonate 27, creatinine 0.96.  CT of the brain was negative.  CT cervical spine was negative for traumatic injury.  X-ray of the right femur showed an acute comminuted displaced angulated fracture of the distal  third of the right femoral shaft, approximately 5 to 6 cm distal to the proximal right femur implant. Orthopedics was consulted.  Dr. Dallas Schimke plans for operative intervention on 11/21/22.  Review of Systems: As mentioned in the history of present illness. All other systems reviewed and are negative. Past Medical History:  Diagnosis Date   B12 deficiency 06/08/2016   Breast cancer (HCC)    Breast cancer, right breast (HCC) 04/25/2013   Cancer (HCC)    Claustrophobia    Claustrophobia    Dementia (HCC)    Hypertension    Iron deficiency anemia 12/15/2016   Malignant neoplasm of upper-outer quadrant of RIGHT female breast (HCC) 04/25/2013   Stage IA (T1b, N0, M0) invasive mucinous breast carcinoma, S/P right breast lumpectomy by Dr. Lovell Sheehan on 03/08/2013 with 1 negative sentinel node. Cancer was identified as ER+ 100%, PR+ 85%, Her2 negative, and Ki-67 marker at 17%. Oncotype Dx score of 20 with 13% 10 year risk of distant recurrence and absolute benefit of chemotherapy at 10 years in this risk category is negligible and less than 1%.   Osteoporosis    Past Surgical History:  Procedure Laterality Date   ABDOMINAL HYSTERECTOMY     CATARACT EXTRACTION, BILATERAL     HIP ARTHROPLASTY Right 10/09/2022   Procedure: ARTHROPLASTY BIPOLAR HIP (HEMIARTHROPLASTY);  Surgeon: Vickki Hearing, MD;  Location: [facility];  Service: Orthopedics;  Laterality: Right;   PARTIAL MASTECTOMY WITH NEEDLE LOCALIZATION AND AXILLARY SENTINEL LYMPH NODE BX Right 03/08/2013   Procedure: PARTIAL MASTECTOMY WITH NEEDLE LOCALIZATION AND  AXILLARY SENTINEL LYMPH NODE BX;  Surgeon: Dalia Heading, MD;  Location: [facility];  Service: General;  Laterality: Right;  Sentinel Node Bx @ 7:30am Needle Loc @ 8:00am   Social History:  reports that she quit smoking about 55 years ago. Her smoking use included cigarettes. She has a 20.00 pack-year smoking history. She has never used smokeless tobacco. She reports that she does not drink alcohol  and does not use drugs.  No Known Allergies  Family History  Problem Relation Age of Onset   Diabetes Mother    Cancer Father    Cancer Sister    Cancer Brother     Prior to Admission medications   Medication Sig Start Date End Date Taking? Authorizing Provider  acetaminophen (TYLENOL) 325 MG tablet Take 2 tablets (650 mg total) by mouth every 6 (six) hours as needed for mild pain. 10/14/22   Tyrone Nine, MD  Balsam [place]-Castor Oil [facility]) OINT Apply to bilateral buttock,coccyx and sacrum every shift.    [provider]  calcium-vitamin D (OSCAL WITH D) 500-200 MG-UNIT per tablet Take 2 tablets by mouth daily with breakfast. 04/25/13   Ellouise Newer, PA-C  cyanocobalamin (VITAMIN B12) 1000 MCG/ML injection Inject 1,000 mcg into the muscle. Once a day on Tuesday.    [provider]  donepezil (ARICEPT) 10 MG tablet Take 1 tablet (10 mg total) by mouth daily. 10/28/22   Sharee Holster, NP  enoxaparin (LOVENOX) 40 MG/0.4ML injection Inject 0.4 mLs (40 mg total) into the skin daily for 20 days. 10/31/22 11/20/22  Sharee Holster, NP  ferrous sulfate 325 (65 FE) MG tablet Take 325 mg by mouth. Once a day on Monday and Thursday    [provider]  UNABLE TO FIND Diet - Liquids: Regular Thin Diet: REGULAR    [provider]    Physical Exam: Vitals:   11/21/22 0918 11/21/22 0925  BP: 128/72   Pulse: 69   Resp: 16   Temp: 97.7 F (36.5 C)   TempSrc: Oral   SpO2: 98%   Weight:  63.5 kg  Height:  5\' 2"  (1.575 m)   GENERAL:  A&O x 2, NAD, well developed, cooperative, follows commands HEENT: [place]/AT, No thrush, No icterus, No oral ulcers Neck:  No neck mass, No meningismus, soft, supple CV: RRR, no S3, no S4, no rub, no JVD Lungs:  bibasilar crackles.  No wheeze Abd: soft/NT +BS, nondistended Ext:  no lymphangitis, no cyanosis, no rashes;  no edema.  R-leg in immobilizer, shortened Neuro:  CN II-XII intact, strength 4/5 in RUE, RLE, strength 4/5  LUE, LLE; sensation intact bilateral; no dysmetria; babinski equivocal  Data Reviewed: Data reviewed above in history  Assessment and Plan: Right distal femur fracture -Ortho--Dr. Dallas Schimke consulted -plan for operative intervention 11/22/22 -hold lovenox pre-op -judicious opioids  Dementia without behavioral disturbance -Continue Aricept -pt had hospital delirium during last hospitalization   Essential hypertension -Holding amlodipine and losartan temporarily -BP soft>>improved, remains well controlled off anti-HTN meds   Diabetes mellitus type 2, controlled -Not on any medications as an outpatient -3/21 hemoglobin A1c--5.9   Right breast cancer -Currently in remission -Status post lumpectomy and lymph node dissection August 2014 -Patient declined radiation therapy -s/p Aromasin October 2014, continued till October 2019.  -Follow-up Dr. Ellin Saba        Advance Care Planning: FULL  Consults: Ortho--[place]  Family Communication: daughter 5/4  Severity of Illness: The appropriate patient status for this patient is  INPATIENT. Inpatient status is judged to be reasonable and necessary in order to provide the required intensity of service to ensure the patient's safety. The patient's presenting symptoms, physical exam findings, and initial radiographic and laboratory data in the context of their chronic comorbidities is felt to place them at high risk for further clinical deterioration. Furthermore, it is not anticipated that the patient will be medically stable for discharge from the hospital within 2 midnights of admission.   * I certify that at the point of admission it is my clinical judgment that the patient will require inpatient hospital care spanning beyond 2 midnights from the point of admission due to high intensity of service, high risk for further deterioration and high frequency of surveillance required.*  Author: Catarina Hartshorn, MD 11/21/2022 12:40 PM  For on call  review [email or web address].

## 2022-11-21 NOTE — ED Notes (Signed)
Patient returned from CT

## 2022-11-21 NOTE — Hospital Course (Signed)
89 year old female with a history of dementia, diabetes mellitus, hypertension, right breast cancer, and B12 deficiency presenting after an unwitnessed fall with right leg pain.  The patient had a recent hospital admission from 10/08/2022 to 10/14/2022 when she had an unwitnessed fall resulting in right femoral neck fracture.  She underwent a right bipolar hip arthroplasty on 10/09/2022.  She was subsequently discharged to [facility] for STR.  She was subsequently discharged back home from STR on 10/28/2022.  Since discharge home, the patient has been able to ambulate with a walker.  She does require assistance with transfers.  According to her daughter, it appears that the amount of assistance is minimal.  However, it has been recommended that the patient not get up by herself.  Nevertheless, the patient got up early in the morning on 11/21/2022 to go to the bathroom and by herself.  She sustained a fall onto her right side and was not able to get up.  As result, EMS was activated.  There was no syncope or loss of consciousness.  The patient was awake and alert when the patient's daughter found her.  Daughter states that since the patient's last hospitalization, the patient has had more sundowning episodes and has had more confusion overall. In the ED, the patient was afebrile and hemodynamically stable with oxygen saturation 97% room air.  WBC 11.7, hemoglobin 10.4, platelets 239,000.  Sodium 138, potassium 4.1, bicarbonate 27, creatinine 0.96.  CT of the brain was negative.  CT cervical spine was negative for traumatic injury.  X-ray of the right femur showed an acute comminuted displaced angulated fracture of the distal third of the right femoral shaft, approximately 5 to 6 cm distal to the proximal right femur implant. Orthopedics was consulted.  Dr. Dallas Schimke plans for operative intervention on 11/21/22. Pt was taken to OR on 11/22/22 by Dr. Dallas Schimke who placed a supracondylar nail.  PT was consulted on post op  D#1

## 2022-11-21 NOTE — ED Notes (Signed)
Ice pack applied to right thigh

## 2022-11-22 ENCOUNTER — Inpatient Hospital Stay (HOSPITAL_COMMUNITY): Payer: PPO

## 2022-11-22 ENCOUNTER — Encounter (HOSPITAL_COMMUNITY)
Admission: EM | Disposition: A | Discharge status: Skilled Nursing Facility | Payer: Self-pay | Source: Home / Self Care | Attending: Internal Medicine

## 2022-11-22 ENCOUNTER — Other Ambulatory Visit: Payer: Self-pay

## 2022-11-22 ENCOUNTER — Inpatient Hospital Stay (HOSPITAL_COMMUNITY): Payer: PPO | Admitting: Anesthesiology

## 2022-11-22 DIAGNOSIS — S72351A Displaced comminuted fracture of shaft of right femur, initial encounter for closed fracture: Secondary | ICD-10-CM | POA: Diagnosis not present

## 2022-11-22 DIAGNOSIS — S72409A Unspecified fracture of lower end of unspecified femur, initial encounter for closed fracture: Secondary | ICD-10-CM | POA: Diagnosis not present

## 2022-11-22 DIAGNOSIS — I10 Essential (primary) hypertension: Secondary | ICD-10-CM | POA: Diagnosis not present

## 2022-11-22 HISTORY — PX: FEMUR IM NAIL: SHX1597

## 2022-11-22 LAB — URINALYSIS, ROUTINE W REFLEX MICROSCOPIC
Bilirubin Urine: NEGATIVE
Glucose, UA: NEGATIVE mg/dL
Ketones, ur: NEGATIVE mg/dL
Nitrite: NEGATIVE
Protein, ur: 100 mg/dL — AB
Specific Gravity, Urine: 1.015 (ref 1.005–1.030)
WBC, UA: >50 WBC/hpf (ref 0–5)
pH: 5 (ref 5.0–8.0)

## 2022-11-22 LAB — CBC
HCT: 28.9 % — ABNORMAL LOW (ref 36.0–46.0)
Hemoglobin: 9.3 g/dL — ABNORMAL LOW (ref 12.0–15.0)
MCH: 30 pg (ref 26.0–34.0)
MCHC: 32.2 g/dL (ref 30.0–36.0)
MCV: 93.2 fL (ref 80.0–100.0)
Platelets: 296 10*3/uL (ref 150–400)
RBC: 3.1 MIL/uL — ABNORMAL LOW (ref 3.87–5.11)
RDW: 14.2 % (ref 11.5–15.5)
WBC: 6.6 10*3/uL (ref 4.0–10.5)
nRBC: 0 % (ref 0.0–0.2)

## 2022-11-22 LAB — BASIC METABOLIC PANEL
Anion gap: 8 (ref 5–15)
BUN: 21 mg/dL (ref 8–23)
CO2: 26 mmol/L (ref 22–32)
Calcium: 8 mg/dL — ABNORMAL LOW (ref 8.9–10.3)
Chloride: 103 mmol/L (ref 98–111)
Creatinine, Ser: 0.95 mg/dL (ref 0.44–1.00)
GFR, Estimated: 58 mL/min — ABNORMAL LOW (ref >60)
Glucose, Bld: 115 mg/dL — ABNORMAL HIGH (ref 70–99)
Potassium: 3.6 mmol/L (ref 3.5–5.1)
Sodium: 137 mmol/L (ref 135–145)

## 2022-11-22 LAB — IRON AND TIBC
Iron: 23 ug/dL — ABNORMAL LOW (ref 28–170)
Saturation Ratios: 9 % — ABNORMAL LOW (ref 10.4–31.8)
TIBC: 251 ug/dL (ref 250–450)
UIBC: 228 ug/dL

## 2022-11-22 LAB — VITAMIN B12: Vitamin B-12: 167 pg/mL — ABNORMAL LOW (ref 180–914)

## 2022-11-22 LAB — FERRITIN: Ferritin: 205 ng/mL (ref 11–307)

## 2022-11-22 LAB — FOLATE: Folate: 14 ng/mL (ref >5.9)

## 2022-11-22 LAB — MRSA NEXT GEN BY PCR, NASAL: MRSA by PCR Next Gen: NOT DETECTED

## 2022-11-22 SURGERY — INTRAMEDULLARY (IM) RETROGRADE FEMORAL NAILING
Anesthesia: General | Laterality: Right

## 2022-11-22 MED ORDER — CHLORHEXIDINE GLUCONATE CLOTH 2 % EX PADS
6.0000 | MEDICATED_PAD | Freq: Every day | CUTANEOUS | Status: DC
  Administered 2022-11-22 – 2022-11-24 (×2): 6 via TOPICAL

## 2022-11-22 MED ORDER — SODIUM CHLORIDE 0.9 % IV SOLN
250.0000 mg | Freq: Once | INTRAVENOUS | Status: AC
  Administered 2022-11-22: 250 mg via INTRAVENOUS
  Filled 2022-11-22: qty 20

## 2022-11-22 MED ORDER — FENTANYL CITRATE (PF) 100 MCG/2ML IJ SOLN
INTRAMUSCULAR | Status: DC | PRN
  Administered 2022-11-22 (×4): 50 ug via INTRAVENOUS

## 2022-11-22 MED ORDER — LACTATED RINGERS IV SOLN: INTRAVENOUS | Status: DC | PRN

## 2022-11-22 MED ORDER — BUPIVACAINE HCL (PF) 0.5 % IJ SOLN
INTRAMUSCULAR | Status: AC
  Filled 2022-11-22: qty 30

## 2022-11-22 MED ORDER — PROPOFOL 10 MG/ML IV BOLUS
INTRAVENOUS | Status: DC | PRN
  Administered 2022-11-22: 80 mg via INTRAVENOUS

## 2022-11-22 MED ORDER — TRANEXAMIC ACID-NACL 1000-0.7 MG/100ML-% IV SOLN
INTRAVENOUS | Status: AC
  Filled 2022-11-22: qty 100

## 2022-11-22 MED ORDER — SODIUM CHLORIDE 0.9 % IR SOLN
Status: DC | PRN
  Administered 2022-11-22: 1000 mL

## 2022-11-22 MED ORDER — FENTANYL CITRATE (PF) 100 MCG/2ML IJ SOLN
INTRAMUSCULAR | Status: AC
  Filled 2022-11-22: qty 2

## 2022-11-22 MED ORDER — PROPOFOL 10 MG/ML IV BOLUS
INTRAVENOUS | Status: AC
  Filled 2022-11-22: qty 20

## 2022-11-22 MED ORDER — BUPIVACAINE-EPINEPHRINE (PF) 0.25% -1:200000 IJ SOLN
INTRAMUSCULAR | Status: AC
  Filled 2022-11-22: qty 30

## 2022-11-22 MED ORDER — CEFAZOLIN SODIUM-DEXTROSE 2-4 GM/100ML-% IV SOLN
2.0000 g | Freq: Three times a day (TID) | INTRAVENOUS | Status: AC
  Administered 2022-11-22 – 2022-11-23 (×2): 2 g via INTRAVENOUS
  Filled 2022-11-22 (×3): qty 100

## 2022-11-22 MED ORDER — VITAMIN B-12 100 MCG PO TABS
500.0000 ug | ORAL_TABLET | Freq: Every day | ORAL | Status: DC
  Administered 2022-11-23 – 2022-11-25 (×3): 500 ug via ORAL
  Filled 2022-11-22 (×3): qty 5

## 2022-11-22 MED ORDER — CYANOCOBALAMIN 1000 MCG/ML IJ SOLN
1000.0000 ug | Freq: Once | INTRAMUSCULAR | Status: AC
  Administered 2022-11-22: 1000 ug via INTRAMUSCULAR
  Filled 2022-11-22: qty 1

## 2022-11-22 MED ORDER — BUPIVACAINE HCL (PF) 0.5 % IJ SOLN
INTRAMUSCULAR | Status: DC | PRN
  Administered 2022-11-22: 30 mL

## 2022-11-22 MED ORDER — CEFAZOLIN SODIUM-DEXTROSE 2-4 GM/100ML-% IV SOLN
INTRAVENOUS | Status: AC
  Administered 2022-11-23: 2 g via INTRAVENOUS
  Filled 2022-11-22: qty 100

## 2022-11-22 SURGICAL SUPPLY — 71 items
APL PRP STRL LF DISP 70% ISPRP (MISCELLANEOUS) ×1
BIT DRILL 4.0X165 AO STYLE (BIT) ×1
BIT DRILL 4.0X280 (BIT) ×2
BLADE HEX COATED 2.75 (ELECTRODE) ×1
BLADE SURG SZ10 CARB STEEL (BLADE) ×2
BNDG CMPR MED 10X6 ELC LF (GAUZE/BANDAGES/DRESSINGS) ×1
BNDG COHESIVE 4X5 TAN STRL (GAUZE/BANDAGES/DRESSINGS) ×1
BNDG ELASTIC 6X10 VLCR STRL LF (GAUZE/BANDAGES/DRESSINGS) ×1
CHLORAPREP W/TINT 26 (MISCELLANEOUS) ×1
CLOTH BEACON ORANGE TIMEOUT ST (SAFETY) ×1
COVER LIGHT HANDLE STERIS (MISCELLANEOUS) ×2
COVER MAYO STAND XLG (MISCELLANEOUS) ×1
DECANTER SPIKE VIAL GLASS SM (MISCELLANEOUS) ×1
DRAPE C-ARM FOLDED MOBILE STRL (DRAPES) ×1
DRAPE C-ARMOR (DRAPES) ×1
DRAPE HALF SHEET 40X57 (DRAPES) ×4
DRAPE HIP W/POCKET STRL (MISCELLANEOUS) ×1
DRAPE ORTHO 2.5IN SPLIT 77X108 (DRAPES) ×2
DRAPE ORTHO SPLIT 77X108 STRL (DRAPES) ×2
DRILL CALIBRATED AO 5.5 (DRILL) ×1
DRSG TEGADERM 2-3/8X2-3/4 SM (GAUZE/BANDAGES/DRESSINGS) ×3
DRSG TEGADERM 4X4.75 (GAUZE/BANDAGES/DRESSINGS) ×4
DRSG XEROFORM 1X8 (GAUZE/BANDAGES/DRESSINGS) ×1
ELECT REM PT RETURN 9FT ADLT (ELECTROSURGICAL) ×1
GAUZE SPONGE 4X4 12PLY STRL (GAUZE/BANDAGES/DRESSINGS) ×1
GLOVE BIO SURGEON STRL SZ8 (GLOVE) ×3
GLOVE BIOGEL M 8.0 STRL (GLOVE) ×3
GLOVE BIOGEL PI IND STRL 7.0 (GLOVE) ×2
GLOVE BIOGEL PI IND STRL 8 (GLOVE) ×1
GLOVE SRG 8 PF TXTR STRL LF DI (GLOVE) ×1
GLOVE SURG UNDER POLY LF SZ8 (GLOVE) ×1
GOWN STRL REUS W/ TWL XL LVL3 (GOWN DISPOSABLE) ×1
GOWN STRL REUS W/TWL LRG LVL3 (GOWN DISPOSABLE) ×2
GOWN STRL REUS W/TWL XL LVL3 (GOWN DISPOSABLE) ×1
GUIDE PIN 3.2X330 (PIN) ×1
GUIDEWIRE BALL NOSE 3.0X900 (WIRE) ×1
GUIDEWIRE ORTH 900X3XBALL NOSE (WIRE) ×1
INST SET MAJOR BONE (KITS) ×1
KIT TURNOVER KIT A (KITS) ×1
MANIFOLD NEPTUNE II (INSTRUMENTS) ×1
MARKER SKIN DUAL TIP RULER LAB (MISCELLANEOUS) ×1
NAIL IM SUPRACONDYLAR 12X22 (Nail) ×1 IMPLANT
NDL HYPO 21X1.5 SAFETY (NEEDLE) ×1 IMPLANT
NEEDLE HYPO 21X1.5 SAFETY (NEEDLE) ×1
NS IRRIG 1000ML POUR BTL (IV SOLUTION) ×1
PACK BASIC III (CUSTOM PROCEDURE TRAY) ×1
PACK SRG BSC III STRL LF ECLPS (CUSTOM PROCEDURE TRAY) ×1
PACK TOTAL JOINT (CUSTOM PROCEDURE TRAY) ×1
PAD ARMBOARD 7.5X6 YLW CONV (MISCELLANEOUS) ×1
PENCIL SMOKE EVACUATOR COATED (MISCELLANEOUS) ×1
SCREW CANC CAPT FT 6.5X80 (Screw) ×1 IMPLANT
SCREW CANC CAPT FT 6.5X85 (Screw) ×1 IMPLANT
SCREW CORT CAPT FT 5.0X36 (Screw) ×2 IMPLANT
SCREW CORT CAPT FT 5.0X38 (Screw) ×1 IMPLANT
SCREW CORT CAPT FT 5.0X85 (Screw) IMPLANT
SCREW CORT CAPT FT 6.5X85 (Screw) ×1 IMPLANT
SET BASIN LINEN APH (SET/KITS/TRAYS/PACK) ×1
SPONGE T-LAP 18X18 ~~LOC~~+RFID (SPONGE) ×2
STAPLER VISISTAT (STAPLE)
STOCKINETTE IMPERVIOUS LG (DRAPES) ×1
STRIP CLOSURE SKIN 1/2X4 (GAUZE/BANDAGES/DRESSINGS) ×2
SUT BRALON NAB BRD #1 30IN (SUTURE) ×2
SUT MNCRL AB 4-0 PS2 18 (SUTURE) ×3
SUT MON AB 0 CT1 (SUTURE) ×2
SUT MON AB 2-0 CT1 36 (SUTURE) ×2
SYR 30ML LL (SYRINGE) ×1
SYR BULB IRRIG 60ML STRL (SYRINGE) ×2
TRAY FOLEY SLVR 16FR LF STAT (SET/KITS/TRAYS/PACK) ×1
WATER STERILE IRR 1000ML POUR (IV SOLUTION) ×2
YANKAUER SUCT 12FT TUBE ARGYLE (SUCTIONS) ×1
YANKAUER SUCT BULB TIP 10FT TU (MISCELLANEOUS) ×1

## 2022-11-22 NOTE — Progress Notes (Addendum)
PROGRESS NOTE  Tamara [name] NGE:[id number] DOB: 21-Feb-1934 DOA: 11/21/2022 PCP: [name], MD  Brief History:  89 year old female with a history of dementia, diabetes mellitus, hypertension, right breast cancer, and B12 deficiency presenting after an unwitnessed fall with right leg pain.  The patient had a recent hospital admission from 10/08/2022 to 10/14/2022 when she had an unwitnessed fall resulting in right femoral neck fracture.  She underwent a right bipolar hip arthroplasty on 10/09/2022.  She was subsequently discharged to [facility] for STR.  She was subsequently discharged back home from STR on 10/28/2022.  Since discharge home, the patient has been able to ambulate with a walker.  She does require assistance with transfers.  According to her daughter, it appears that the amount of assistance is minimal.  However, it has been recommended that the patient not get up by herself.  Nevertheless, the patient got up early in the morning on 11/21/2022 to go to the bathroom and by herself.  She sustained a fall onto her right side and was not able to get up.  As result, EMS was activated.  There was no syncope or loss of consciousness.  The patient was awake and alert when the patient's daughter found her.  Daughter states that since the patient's last hospitalization, the patient has had more sundowning episodes and has had more confusion overall. In the ED, the patient was afebrile and hemodynamically stable with oxygen saturation 97% room air.  WBC 11.7, hemoglobin 10.4, platelets 239,000.  Sodium 138, potassium 4.1, bicarbonate 27, creatinine 0.96.  CT of the brain was negative.  CT cervical spine was negative for traumatic injury.  X-ray of the right femur showed an acute comminuted displaced angulated fracture of the distal third of the right femoral shaft, approximately 5 to 6 cm distal to the proximal right femur implant. Orthopedics was consulted.  Dr. Dallas Schimke plans for operative  intervention on 11/21/22. Pt was taken to OR on 11/22/22 by Dr. Dallas Schimke who placed a supracondylar nail.  PT was consulted on post op D#1   Assessment/Plan: Right distal femur fracture -Ortho--Dr. Dallas Schimke consulted -11/22/22--supracondylar nail placement -hold lovenox pre-op -judicious opioids -PT eval on 11/23/22   Dementia without behavioral disturbance -Continue Aricept -pt had hospital delirium during last hospitalization   Essential hypertension -Holding amlodipine and losartan temporarily -BP soft>>improved, remains well controlled off anti-HTN meds   Diabetes mellitus type 2, controlled -Not on any medications as an outpatient -10/08/22 hemoglobin A1c--5.9   Right breast cancer -Currently in remission -Status post lumpectomy and lymph node dissection August 2014 -Patient declined radiation therapy -s/p Aromasin October 2014, continued till October 2019.  -Follow-up Dr. Ellin Saba  Pyuria -5/5 UA >50 WBC -await urine culture  B12 deficiency -B12--167 -give B12 inj, then start po  Iron deficiency -give nulecit x 1 -iron saturation 9%, iron 23, ferritin 205       Family Communication:  no Family at bedside  Consultants:  ortho--Dr. Dallas Schimke  Code Status:  FULL  DVT Prophylaxis:  on hold for surgery--plan start on POD#1   Procedures: As Listed in Progress Note Above  Antibiotics: None       Subjective: Pt denies cp, sob, abd pain. Complains of leg pain.  Pleasantly confused.  Objective: Vitals:   11/22/22 1200 11/22/22 1215 11/22/22 1236 11/22/22 1444  BP:  (!) 218/141 (!) 138/95 110/75  Pulse:  91 92 88  Resp: 18 18 20 18   Temp:  98.1 F (36.7 C)  TempSrc:      SpO2: 100% 98% 97% 94%  Weight:      Height:        Intake/Output Summary (Last 24 hours) at 11/22/2022 1651 Last data filed at 11/22/2022 1152 Gross per 24 hour  Intake 1200 ml  Output 670 ml  Net 530 ml   Weight change:  Exam:  General:  Pt is alert, follows commands  appropriately, not in acute distress HEENT: No icterus, No thrush, No neck mass, [place]/AT Cardiovascular: RRR, S1/S2, no rubs, no gallops Respiratory: bibasilar crackles Abdomen: Soft/+BS, non tender, non distended, no guarding Extremities: No edema, No lymphangitis, No petechiae, No rashes, no synovitis   Data Reviewed: I have personally reviewed following labs and imaging studies Basic Metabolic Panel: Recent Labs  Lab 11/21/22 1111 11/22/22 0407  NA 138 137  K 4.1 3.6  CL 104 103  CO2 27 26  GLUCOSE 143* 115*  BUN 19 21  CREATININE 0.96 0.95  CALCIUM 8.3* 8.0*   Liver Function Tests: No results for input(s): "AST", "ALT", "ALKPHOS", "BILITOT", "PROT", "ALBUMIN" in the last 168 hours. No results for input(s): "LIPASE", "AMYLASE" in the last 168 hours. No results for input(s): "AMMONIA" in the last 168 hours. Coagulation Profile: Recent Labs  Lab 11/21/22 1111  INR 1.1   CBC: Recent Labs  Lab 11/21/22 1111 11/22/22 0407  WBC 11.7* 6.6  NEUTROABS 9.7*  --   HGB 10.4* 9.3*  HCT 32.5* 28.9*  MCV 94.2 93.2  PLT 339 296   Cardiac Enzymes: No results for input(s): "CKTOTAL", "CKMB", "CKMBINDEX", "TROPONINI" in the last 168 hours. BNP: Invalid input(s): "POCBNP" CBG: No results for input(s): "GLUCAP" in the last 168 hours. HbA1C: No results for input(s): "HGBA1C" in the last 72 hours. Urine analysis:    Component Value Date/Time   COLORURINE YELLOW 11/22/2022 1200   APPEARANCEUR TURBID (A) 11/22/2022 1200   LABSPEC 1.015 11/22/2022 1200   PHURINE 5.0 11/22/2022 1200   GLUCOSEU NEGATIVE 11/22/2022 1200   HGBUR SMALL (A) 11/22/2022 1200   BILIRUBINUR NEGATIVE 11/22/2022 1200   KETONESUR NEGATIVE 11/22/2022 1200   PROTEINUR 100 (A) 11/22/2022 1200   NITRITE NEGATIVE 11/22/2022 1200   LEUKOCYTESUR SMALL (A) 11/22/2022 1200   Sepsis Labs: @LABRCNTIP (procalcitonin:4,lacticidven:4) ) Recent Results (from the past 240 hour(s))  MRSA Next Gen by PCR, Nasal      Status: None   Collection Time: 11/22/22  4:33 AM   Specimen: Nasal Mucosa; Nasal Swab  Result Value Ref Range Status   MRSA by PCR Next Gen NOT DETECTED NOT DETECTED Final    Comment: (NOTE) The GeneXpert MRSA Assay (FDA approved for NASAL specimens only), is one component of a comprehensive MRSA colonization surveillance program. It is not intended to diagnose MRSA infection nor to guide or monitor treatment for MRSA infections. Test performance is not FDA approved in patients less than 69 years old. Performed at [facility], [address], [place], [place] [address]      Scheduled Meds:  calcium-vitamin D  2 tablet Oral Q breakfast   Chlorhexidine Gluconate Cloth  6 each Topical Daily   donepezil  10 mg Oral Daily   feeding supplement  237 mL Oral BID BM   [START ON 11/23/2022] ferrous sulfate  325 mg Oral Once per day on Mon Thu   multivitamin with minerals  1 tablet Oral Daily   Continuous Infusions:   ceFAZolin (ANCEF) IV 2 g (11/22/22 1631)   tranexamic acid  Procedures/Studies: DG C-Arm 1-60 Min  Result Date: 11/22/2022 CLINICAL DATA:  ORIF right hip fracture EXAM: DG C-ARM 1-60 MIN FLUOROSCOPY: Fluoroscopy Time:  2 minutes 30 seconds Radiation Exposure Index (if provided by the fluoroscopic device): 15.861 mGy Number of Acquired Spot Images: 11 COMPARISON:  11/21/2022 FINDINGS: 11 fluoroscopic images are obtained during the performance of the procedure and are provided for interpretation only. Images demonstrate placement of an intramedullary rod with proximal and distal interlocking screws traversing the comminuted distal femoral fracture seen previously. Alignment appears near anatomic. Please refer to the operative report. IMPRESSION: 1. ORIF distal right femur fracture. Electronically Signed   By: Sharlet Salina M.D.   On: 11/22/2022 15:29   DG FEMUR PORT, MIN 2 VIEWS RIGHT  Result Date: 11/22/2022 CLINICAL DATA:  Postop right femur ORIF. EXAM: RIGHT FEMUR PORTABLE 2  VIEW COMPARISON:  11/21/2022. FINDINGS: Submitted images demonstrate placement of an intramedullary rod on from the inferior margin of the intramedullary stem of the right hip prosthesis to the intercondylar aspect of the distal femur. The primary fracture components of the distal femoral shaft fracture have been significantly reduced. There is residual displacement of the primary distal component 8 mm lateral. Orthopedic hardware appears well seated. There is surrounding soft tissue edema. IMPRESSION: 1. Significant reduction of the comminuted displaced distal right femoral shaft fracture following ORIF. Orthopedic hardware is well-seated. Electronically Signed   By: Amie Portland M.D.   On: 11/22/2022 13:14   DG Pelvis Portable  Result Date: 11/22/2022 CLINICAL DATA:  Femur fracture EXAM: PORTABLE PELVIS 1-2 VIEWS COMPARISON:  10/09/2022 FINDINGS: Previous bipolar hip replacement on the right. No evidence of acute pelvic region fracture. IMPRESSION: Previous bipolar hip replacement on the right. Electronically Signed   By: Paulina Fusi M.D.   On: 11/22/2022 12:55   DG Chest Portable 1 View  Result Date: 11/21/2022 CLINICAL DATA:  Preop.  Right hip fracture. EXAM: PORTABLE CHEST 1 VIEW COMPARISON:  10/10/2022. FINDINGS: Cardiac silhouette mildly enlarged.  No mediastinal or hilar masses. Bilateral interstitial thickening and areas of scarring, stable from the prior exam. No evidence of pneumonia or pulmonary edema. No convincing pleural effusion or pneumothorax. Skeletal structures are diffusely demineralized. IMPRESSION: No acute cardiopulmonary disease. Electronically Signed   By: Amie Portland M.D.   On: 11/21/2022 11:42   CT Cervical Spine Wo Contrast  Result Date: 11/21/2022 CLINICAL DATA:  89 year old female status post fall walking to bathroom. Found down. Pain. EXAM: CT CERVICAL SPINE WITHOUT CONTRAST TECHNIQUE: Multidetector CT imaging of the cervical spine was performed without intravenous  contrast. Multiplanar CT image reconstructions were also generated. RADIATION DOSE REDUCTION: This exam was performed according to the departmental dose-optimization program which includes automated exposure control, adjustment of the mA and/or kV according to patient size and/or use of iterative reconstruction technique. COMPARISON:  Head CT today.  No prior cervical spine. FINDINGS: Alignment: Maintained cervical lordosis. Subtle degenerative appearing anterolisthesis of C6 on C7. Cervicothoracic junction alignment is within normal limits. Bilateral posterior element alignment is within normal limits. Skull base and vertebrae: Osteopenia. Visualized skull base is intact. No atlanto-occipital dissociation. C1 and C2 appear intact and aligned. No acute osseous abnormality identified. Soft tissues and spinal canal: No prevertebral fluid or swelling. No visible canal hematoma. Calcified carotid atherosclerosis in the neck. Otherwise negative visible noncontrast neck soft tissues. Disc levels: Mild for age cervical spine degeneration and capacious spinal canal at most levels. Upper chest: Osteopenia. Grossly intact visible upper thoracic levels. Lung apices are clear.  IMPRESSION: 1. No acute traumatic injury identified in the cervical spine. 2. Osteopenia. Mild for age cervical spine degeneration. Electronically Signed   By: Odessa Fleming M.D.   On: 11/21/2022 10:43   DG Femur Min 2 Views Right  Result Date: 11/21/2022 CLINICAL DATA:  89 year old female status post fall walking to bathroom. Found down. Pain. Status post right hip fracture and ORIF in March. EXAM: RIGHT FEMUR 2 VIEWS COMPARISON:  Preoperative right hip series 10/08/2022. postoperative pelvis radiographs 10/09/2022. FINDINGS: Four views of the right femur. The right hip arthroplasty, femoral stem appears stable and intact. But there is a highly comminuted femoral shaft fracture in the distal 3rd, beginning about fiber 6 cm distal to the implant. One full  shaft width posterior displacement with pronounced posterior angulation. Displaced 8 cm butterfly fragment. Mild medial angulation. Grossly maintained right knee joint alignment with severe joint space loss. IMPRESSION: Acutely comminuted, displaced and angulated fracture of the distal 3rd right femoral shaft, 5-6 cm distal to the proximal right femoral implant. Electronically Signed   By: Odessa Fleming M.D.   On: 11/21/2022 10:40   CT Head Wo Contrast  Result Date: 11/21/2022 CLINICAL DATA:  89 year old female status post fall walking to bathroom. Found down. Pain. EXAM: CT HEAD WITHOUT CONTRAST TECHNIQUE: Contiguous axial images were obtained from the base of the skull through the vertex without intravenous contrast. RADIATION DOSE REDUCTION: This exam was performed according to the departmental dose-optimization program which includes automated exposure control, adjustment of the mA and/or kV according to patient size and/or use of iterative reconstruction technique. COMPARISON:  Head CT 10/08/2022. FINDINGS: Brain: No midline shift, mass effect, or evidence of intracranial mass lesion. No ventriculomegaly. Chronic anterior temporal lobe atrophy more pronounced on the left (series 2, image 11). Stable cerebral volume. Stable gray-white matter differentiation throughout the brain. Patchy up to moderate bilateral periventricular white matter hypodensity. No acute intracranial hemorrhage identified. No cortically based acute infarct identified. Vascular: Calcified atherosclerosis at the skull base. No suspicious intracranial vascular hyperdensity. Skull: Stable. Osteopenia. No acute osseous abnormality identified. Sinuses/Orbits: Visualized paranasal sinuses and mastoids are stable and well aerated. Other: No orbit or scalp soft tissue injury identified. IMPRESSION: 1. No acute intracranial abnormality or acute traumatic injury identified. 2. Stable non contrast CT appearance of temporal lobe atrophy, white matter  disease. Cerebral Atrophy (ICD10-G31.9). Electronically Signed   By: Odessa Fleming M.D.   On: 11/21/2022 10:38    Catarina Hartshorn, DO  [facility]  If 7PM-7AM, please contact night-coverage [email or web address] Password [email or web address] 11/22/2022, 4:51 PM   LOS: 1 day

## 2022-11-22 NOTE — Progress Notes (Signed)
Pre Procedure note for inpatients:   Tamara [name] has been scheduled for Procedure(s): INTRAMEDULLARY (IM) RETROGRADE FEMORAL NAILING (Right) today. The various methods of treatment have been discussed with the patient. After consideration of the risks, benefits and treatment options the patient has consented to the planned procedure.   The patient has been seen and labs reviewed. There are no changes in the patient's condition to prevent proceeding with the planned procedure today.  Recent labs:     Latest Ref Rng & Units 11/22/2022    4:07 AM 11/21/2022   11:11 AM 10/29/2022    8:00 AM  CBC  WBC 4.0 - 10.5 K/uL 6.6  11.7    Hemoglobin 12.0 - 15.0 g/dL 9.3  16.1  9.2   Hematocrit 36.0 - 46.0 % 28.9  32.5  28.6   Platelets 150 - 400 K/uL 296  339      Vitals:   11/21/22 2123 11/22/22 0200  BP: (!) 141/77 102/71  Pulse:  91  Resp: 18 19  Temp: 98.9 F (37.2 C) 98.7 F (37.1 C)  SpO2: 97% 96%   Patient is demented.  No issues over night.  Family at bedside.  Right thigh is swollen No lacerations Sensation is intact distally Toes are warm and well perfused She is actively dorsiflexing the great toe and ankle    Oliver Barre, MD 11/22/2022 8:58 AM

## 2022-11-22 NOTE — Anesthesia Postprocedure Evaluation (Signed)
Anesthesia Post Note  Patient: Tamara [name]  Procedure(s) Performed: INTRAMEDULLARY (IM) RETROGRADE FEMORAL NAILING (Right)  Patient location during evaluation: PACU Anesthesia Type: General Level of consciousness: awake and alert and confused Pain management: pain level controlled Vital Signs Assessment: post-procedure vital signs reviewed and stable Respiratory status: spontaneous breathing, nonlabored ventilation, respiratory function stable and patient connected to nasal cannula oxygen Cardiovascular status: blood pressure returned to baseline and stable Postop Assessment: no apparent nausea or vomiting Anesthetic complications: no   No notable events documented.   Last Vitals:  Vitals:   11/21/22 2123 11/22/22 0200  BP: (!) 141/77 102/71  Pulse:  91  Resp: 18 19  Temp: 37.2 C 37.1 C  SpO2: 97% 96%    Last Pain:  Vitals:   11/22/22 0000  TempSrc:   PainSc: Asleep                 Windell Norfolk

## 2022-11-22 NOTE — Progress Notes (Signed)
Patient is unable to sign consent due to mentation.

## 2022-11-22 NOTE — TOC Initial Note (Signed)
Transition of Care [facility]) - Initial/Assessment Note    Patient Details  Name: Tamara [name] MRN: [id number] Date of Birth: 09-Apr-1934  [facility] CM/SW Contact:    Catalina Gravel, LCSW Phone Number: 11/22/2022, 12:41 PM  Clinical Narrative:                 Pt from home lives with daughter. Pt previously at [facility] in March due to fracture- released to [facility] for rehab. Pt back now with new fracture, surgery today.  Pt likely will need SNF at [facility]. Pt has HTA for Ins. TOC to follow.     Barriers to Discharge: Continued Medical Work up   Patient Goals and CMS Choice            Expected Discharge Plan and Services                                              Prior Living Arrangements/Services                       Activities of Daily Living Home Assistive Devices/Equipment: Environmental consultant (specify type), Wheelchair ADL Screening (condition at time of admission) Patient's cognitive ability adequate to safely complete daily activities?: Yes Is the patient deaf or have difficulty hearing?: Yes Does the patient have difficulty seeing, even when wearing glasses/contacts?: No Does the patient have difficulty concentrating, remembering, or making decisions?: Yes Patient able to express need for assistance with ADLs?: Yes Does the patient have difficulty dressing or bathing?: No Independently performs ADLs?: Yes (appropriate for developmental age) Does the patient have difficulty walking or climbing stairs?: Yes Weakness of Legs: Both Weakness of Arms/Hands: Both  Permission Sought/Granted                  Emotional Assessment              Admission diagnosis:  Closed fracture of distal end of right femur, unspecified fracture morphology, initial encounter (HCC) [S72.401A] Closed fracture of distal end of femur, unspecified fracture morphology, initial encounter (HCC) [S72.409A] Patient Active Problem List   Diagnosis Date Noted   Closed  fracture of distal end of femur, unspecified fracture morphology, initial encounter (HCC) 11/21/2022   Closed displaced comminuted fracture of shaft of right femur (HCC) 11/21/2022   Iron deficiency 10/27/2022   Aortic atherosclerosis (HCC) 10/19/2022   Hypertension associated with type 2 diabetes mellitus (HCC) 10/19/2022   Cystitis with hematuria 10/11/2022   Postoperative fever 10/10/2022   Closed fracture of right hip (HCC) 10/08/2022   Dementia without behavioral disturbance (HCC) 10/08/2022   Fall at home, initial encounter 10/08/2022   Aortic valve stenosis, nonrheumatic 03/24/2022   Pre-diabetes 03/24/2022   Disorder of mitral valve 03/24/2022   Essential (primary) hypertension 03/24/2022   Fibrosis lung (HCC) 03/24/2022   Acute blood loss anemia 12/15/2016   B12 deficiency 06/08/2016   Breast CA (HCC) 06/26/2014   Malignant neoplasm of upper-outer quadrant of RIGHT female breast (HCC) 04/25/2013   Osteoporosis 08/10/2007   PCP:  [name], MD Pharmacy:   [facility] [address], [place] - [address] [place] [address] Phone: [phone] Fax: [phone]     Social Determinants of Health (SDOH) Social History: SDOH Screenings   Food Insecurity: No Food Insecurity (10/08/2022)  Housing: Low Risk  (  10/08/2022)  Transportation Needs: No Transportation Needs (10/08/2022)  Utilities: Not At Risk (10/08/2022)  Depression (PHQ2-9): Low Risk  (10/14/2022)  Tobacco Use: Medium Risk (11/21/2022)   SDOH Interventions:     Readmission Risk Interventions     No data to display

## 2022-11-22 NOTE — Op Note (Signed)
Orthopaedic Surgery Operative Note (CSN: [id number])  Tamara [name]  01-01-1934 Date of Surgery: 11/21/2022 - 11/22/2022   Diagnoses:  Distal right femur fracture  Procedure: Retrograde femoral nail for comminuted right distal femoral shaft fracture   Operative Finding Successful completion of the planned procedure.  Placement of supracondylar nail, diameter of 12 mm with use of a single blocking screw.  3 proximal interlocking screws, 3 distal interlocking screws.   Post-Op Diagnosis: Same Surgeons:Primary: Oliver Barre, MD Assistants: Westly Pam Location: [facility] OR ROOM 4 Anesthesia: General with local anesthesia Antibiotics: Ancef 2 g Tourniquet time: N/A Estimated Blood Loss: 200 cc Complications: None Specimens: None  Implants: Implant Name Type Inv. Item Serial No. Manufacturer Lot No. LRB No. Used Action  CORTICAL SCREW  CAPTURED 5.0 X     [id number] Right 1 Explanted  CORTICAL SCREW, CAPTURED FT 6.5 X     [id number] Right 1 Implanted  CANCELLOUS SCREW CAPYURED FT 6.5 X     [id number] Right 1 Implanted  CANCELLOUS SCREW CAPTURED FT 6.5 X     [id number] Right 1 Implanted  CORTICAL SCREW CAPTURED 5.0 X     [id number] Right 1 Implanted  CORTICAL SCREW CPTURED 5.0 X 36 MM     [id number] Right 1 Implanted    Indications for Surgery:   Tamara [name] is a 89 y.o. female who fell and sustained a comminuted right distal femoral shaft fracture.  This injury was complicated by recent history of right hip hemiarthroplasty for a displaced femoral neck fracture.  She is approximately 6-8 weeks out from the surgery.  The stem is well-fitting.  No concern for loosening.  The fracture does not include the prior hardware.  Nonetheless, given the extent of the injury, and desire to keep her weightbearing.  In addition, she does have dementia, and will not be able to follow instructions appropriately.  As such, I recommended operative fixation, to stabilize the right lower extremity, and  allow her to bear weight as soon as possible.  This was discussed with family.  Benefits and risks of operative and nonoperative management were discussed prior to surgery with patient's family and informed consent form was completed.  Specific risks including infection, need for additional surgery, bleeding, nonunion, malunion, persistent pain, damage to surrounding structures as well as more serious complications associate with anesthesia were discussed.  All questions were answered.  Patient's family elected proceed with surgery.   Procedure:   The patient was identified properly. Informed consent was obtained and the surgical site was marked. The patient was taken to the OR where general anesthesia was induced.  The patient was positioned supine, with a triangle secured underneath the drapes.  The right leg was prepped and draped in the usual sterile fashion.  Timeout was performed before the beginning of the case.  She received 2 g of Ancef, as well as a gram of TXA prior to making incision.  Preoperative fluoroscopy confirmed position of the fracture, as well as her ability to reduce the fracture using the triangle and traction.  The patella was marked out, and we made an incision just proximal to the patellar tendon.  We incised sharply through skin, then continued down through the subcutaneous fat.  The patella tendon was identified, and sharply incised in line with its fibers.  We were careful not to damage the anterior meniscus medially.  This gave [place] full access to the knee joint.  We able to palpate the  femoral condyles.  Under fluoroscopy, our guidepin was introduced ensuring that we an appropriate starting point on the AP, as well as the lateral fluoroscopic images.  The guidepin was then introduced.  We used an opening reamer.  We then used a finger to help reduce the fracture.  The guidewire was introduced through the finger fracture reduction tool.  Preoperative templating is demonstrated  that the canal was patulous.  We did not need to ream.  Given the prior surgery, including a hip hemiarthroplasty, there was limited room.  As result, we measured, but proceeded with a supracondylar nail which was 220 mm.  We selected a size 12.  This was introduced.  We watched the fracture reduction under fluoroscopy.  Initially, the nail was unable to adequately reduce the fracture, particularly on the lateral imaging.  The nail was carefully removed until it was proximal to the fracture.  We then placed a single blocking screw from lateral to medial, and the anterior one third of the cortex, on the lateral image.  We then used the blocking screw to help improve our reduction.  The nail was advanced in retrograde fashion.  We are able to abut the tip of the hip prosthesis, with the retrograde nail sitting just anterior.  There was good overlap.  With the nail in good position at this point, we pulled additional traction, and were able to achieve appropriate length on the femur.  We carefully evaluated the tip of the nail within the knee joint, and confirmed that it had been sufficiently countersunk.  Using the outrigger, we then placed 3 screws in the distal aspect of the nail, within the femoral condyles.  Fluoroscopy confirmed appropriate positioning of these 3 screws.  We then turned our attention to the proximal aspect of the nail.  With the supracondylar nail, there was an attachment that would allow [place] to place our proximal interlocking screws through the outrigger.  We were able to improve our reduction with some anterior pressure on the femur.  We then placed 3 proximal interlocking screws, which achieved excellent purchase.  The outrigger device was removed from the nail.  Final fluoroscopic images confirmed improved reduction and stability of the fracture, in which we restored length and rotation.  There was a large medial butterfly fragment which was unable to be adjusted.  Final images confirmed  appropriate placement of the screws, with acceptable alignment of the fracture.  At the knee, the nail was sufficiently countersunk.  In addition, the nail was not palpable within the knee joint.  We irrigated the wounds copiously.  Next we closed the incisions in a multilayer fashion with absorbable suture.  Sterile dressing was placed, followed by an Ace wrap.  Patient was awoken taken to PACU in stable condition.   Post-operative plan:  The patient will be WBAT on the operative extremity.  Range of motion as tolerated in the right knee. Patient will be returned to the floor when she is sufficiently recovered in the PACU DVT prophylaxis per primary team, no orthopedic contraindications.   DVT prophylaxis will begin on postoperative day #1. Pain control with PRN pain medication preferring oral medicines.   PT/OT to work with the patient starting tomorrow. Follow up plan will be scheduled in approximately 10-14 days for incision check and XR.

## 2022-11-22 NOTE — Anesthesia Preprocedure Evaluation (Signed)
Anesthesia Evaluation  Patient identified by MRN, date of birth, ID band Patient awake    Reviewed: Allergy & Precautions, H&P , NPO status , Patient's Chart, lab work & pertinent test results  Airway Mallampati: II  TM Distance: >3 FB Neck ROM: Full    Dental  (+) Lower Dentures, Upper Dentures   Pulmonary neg pulmonary ROS, former smoker   Pulmonary exam normal breath sounds clear to auscultation       Cardiovascular Exercise Tolerance: Good hypertension, Pt. on medications Normal cardiovascular exam+ Valvular Problems/Murmurs  Rhythm:Regular Rate:Normal     Neuro/Psych  PSYCHIATRIC DISORDERS Anxiety    Dementia negative neurological ROS     GI/Hepatic negative GI ROS, Neg liver ROS,,,  Endo/Other  diabetes, Well Controlled, Type 2, Oral Hypoglycemic Agents    Renal/GU negative Renal ROS  negative genitourinary   Musculoskeletal negative musculoskeletal ROS (+)    Abdominal   Peds negative pediatric ROS (+)  Hematology  (+) Blood dyscrasia, anemia   Anesthesia Other Findings Right breast cancer  Reproductive/Obstetrics negative OB ROS                             Anesthesia Physical Anesthesia Plan  ASA: 3  Anesthesia Plan: General LMA   Post-op Pain Management: Dilaudid IV   Induction: Intravenous  PONV Risk Score and Plan: Ondansetron  Airway Management Planned: Nasal Cannula, Natural Airway and Oral ETT  Additional Equipment:   Intra-op Plan:   Post-operative Plan: Extubation in OR  Informed Consent: I have reviewed the patients History and Physical, chart, labs and discussed the procedure including the risks, benefits and alternatives for the proposed anesthesia with the patient or authorized representative who has indicated his/her understanding and acceptance.     Dental advisory given  Plan Discussed with: CRNA and Surgeon  Anesthesia Plan Comments:          Anesthesia Quick Evaluation

## 2022-11-22 NOTE — Transfer of Care (Signed)
Immediate Anesthesia Transfer of Care Note  Patient: Tamara [name]  Procedure(s) Performed: INTRAMEDULLARY (IM) RETROGRADE FEMORAL NAILING (Right)  Patient Location: PACU  Anesthesia Type:General  Level of Consciousness: awake and confused  Airway & Oxygen Therapy: Patient Spontanous Breathing  Post-op Assessment: Report given to RN and Post -op Vital signs reviewed and stable  Post vital signs: Reviewed and stable  Last Vitals:  Vitals Value Taken Time  BP    Temp 98   Pulse 71   Resp 16 11/22/22 1203  SpO2 95   Vitals shown include unvalidated device data.  Last Pain:  Vitals:   11/22/22 0000  TempSrc:   PainSc: Asleep         Complications: No notable events documented.

## 2022-11-23 DIAGNOSIS — I152 Hypertension secondary to endocrine disorders: Secondary | ICD-10-CM | POA: Diagnosis not present

## 2022-11-23 DIAGNOSIS — S72409A Unspecified fracture of lower end of unspecified femur, initial encounter for closed fracture: Secondary | ICD-10-CM | POA: Diagnosis not present

## 2022-11-23 DIAGNOSIS — E1159 Type 2 diabetes mellitus with other circulatory complications: Secondary | ICD-10-CM | POA: Diagnosis not present

## 2022-11-23 DIAGNOSIS — E538 Deficiency of other specified B group vitamins: Secondary | ICD-10-CM | POA: Diagnosis not present

## 2022-11-23 LAB — BASIC METABOLIC PANEL
Anion gap: 7 (ref 5–15)
BUN: 18 mg/dL (ref 8–23)
CO2: 24 mmol/L (ref 22–32)
Calcium: 7.2 mg/dL — ABNORMAL LOW (ref 8.9–10.3)
Chloride: 103 mmol/L (ref 98–111)
Creatinine, Ser: 0.94 mg/dL (ref 0.44–1.00)
GFR, Estimated: 59 mL/min — ABNORMAL LOW (ref >60)
Glucose, Bld: 131 mg/dL — ABNORMAL HIGH (ref 70–99)
Potassium: 3.7 mmol/L (ref 3.5–5.1)
Sodium: 134 mmol/L — ABNORMAL LOW (ref 135–145)

## 2022-11-23 LAB — CBC
HCT: 24.5 % — ABNORMAL LOW (ref 36.0–46.0)
Hemoglobin: 7.9 g/dL — ABNORMAL LOW (ref 12.0–15.0)
MCH: 30.2 pg (ref 26.0–34.0)
MCHC: 32.2 g/dL (ref 30.0–36.0)
MCV: 93.5 fL (ref 80.0–100.0)
Platelets: 243 10*3/uL (ref 150–400)
RBC: 2.62 MIL/uL — ABNORMAL LOW (ref 3.87–5.11)
RDW: 14.3 % (ref 11.5–15.5)
WBC: 9.5 10*3/uL (ref 4.0–10.5)
nRBC: 0 % (ref 0.0–0.2)

## 2022-11-23 LAB — URINE CULTURE: Culture: >=100000 — AB

## 2022-11-23 LAB — MAGNESIUM: Magnesium: 2.1 mg/dL (ref 1.7–2.4)

## 2022-11-23 NOTE — Progress Notes (Signed)
PROGRESS NOTE  Tamara [name] WUJ:[id number] DOB: 05/19/1934 DOA: 11/21/2022 PCP: [name], MD  Brief History:  89 year old female with a history of dementia, diabetes mellitus, hypertension, right breast cancer, and B12 deficiency presenting after an unwitnessed fall with right leg pain.  The patient had a recent hospital admission from 10/08/2022 to 10/14/2022 when she had an unwitnessed fall resulting in right femoral neck fracture.  She underwent a right bipolar hip arthroplasty on 10/09/2022.  She was subsequently discharged to [facility] for STR.  She was subsequently discharged back home from STR on 10/28/2022.  Since discharge home, the patient has been able to ambulate with a walker.  She does require assistance with transfers.  According to her daughter, it appears that the amount of assistance is minimal.  However, it has been recommended that the patient not get up by herself.  Nevertheless, the patient got up early in the morning on 11/21/2022 to go to the bathroom and by herself.  She sustained a fall onto her right side and was not able to get up.  As result, EMS was activated.  There was no syncope or loss of consciousness.  The patient was awake and alert when the patient's daughter found her.  Daughter states that since the patient's last hospitalization, the patient has had more sundowning episodes and has had more confusion overall. In the ED, the patient was afebrile and hemodynamically stable with oxygen saturation 97% room air.  WBC 11.7, hemoglobin 10.4, platelets 239,000.  Sodium 138, potassium 4.1, bicarbonate 27, creatinine 0.96.  CT of the brain was negative.  CT cervical spine was negative for traumatic injury.  X-ray of the right femur showed an acute comminuted displaced angulated fracture of the distal third of the right femoral shaft, approximately 5 to 6 cm distal to the proximal right femur implant. Orthopedics was consulted.  Dr. Dallas Schimke plans for operative  intervention on 11/21/22. Pt was taken to OR on 11/22/22 by Dr. Dallas Schimke who placed a supracondylar nail.  PT was consulted on post op D#1   Assessment/Plan: Right distal femur fracture -Ortho--Dr. Dallas Schimke consulted -11/22/22--supracondylar nail placement -start lovenox today POD#1>>SNF -judicious opioids -PT eval on 11/23/22   Dementia without behavioral disturbance -Continue Aricept -pt had hospital delirium during last hospitalization   Essential hypertension -Holding amlodipine and losartan temporarily -BP soft>>improved, remains well controlled off anti-HTN meds   Diabetes mellitus type 2, controlled -Not on any medications as an outpatient -10/08/22 hemoglobin A1c--5.9   Right breast cancer -Currently in remission -Status post lumpectomy and lymph node dissection August 2014 -Patient declined radiation therapy -s/p Aromasin October 2014, continued till October 2019.  -Follow-up Dr. Ellin Saba   Pyuria -5/5 UA >50 WBC -await urine culture   B12 deficiency -B12--167 -give B12 inj, then started po   Iron deficiency -give nulecit x 1 on 11/22/22 -iron saturation 9%, iron 23, ferritin 205 -continue po ferrous sulfate             Family Communication:  no Family at bedside   Consultants:  ortho--Dr. Dallas Schimke   Code Status:  FULL   DVT Prophylaxis:  on hold for surgery--plan start on POD#1     Procedures: As Listed in Progress Note Above   Antibiotics: None          Subjective: Patient denies fevers, chills, headache, chest pain, dyspnea, nausea, vomiting, diarrhea, abdominal pain, dysuria, hematuria, hematochezia, and melena.   Objective: Vitals:  11/23/22 0619 11/23/22 1435 11/23/22 1441 11/23/22 1601  BP: (!) 102/48 (!) 103/49 (!) 94/50 (!) 110/47  Pulse: 89 86 86 87  Resp: 16 16 17 16   Temp: 98.5 F (36.9 C) 98.5 F (36.9 C) 97.9 F (36.6 C)   TempSrc:      SpO2: 95% 97% 97% 96%  Weight:      Height:        Intake/Output Summary (Last 24  hours) at 11/23/2022 1734 Last data filed at 11/23/2022 1500 Gross per 24 hour  Intake 723.89 ml  Output 1100 ml  Net -376.11 ml   Weight change:  Exam:  General:  Pt is alert, follows commands appropriately, not in acute distress HEENT: No icterus, No thrush, No neck mass, [place]/AT Cardiovascular: RRR, S1/S2, no rubs, no gallops Respiratory: bibasilar crackles. No wheeze Abdomen: Soft/+BS, non tender, non distended, no guarding Extremities: No edema, No lymphangitis, No petechiae, No rashes, no synovitis   Data Reviewed: I have personally reviewed following labs and imaging studies Basic Metabolic Panel: Recent Labs  Lab 11/21/22 1111 11/22/22 0407 11/23/22 0733  NA 138 137 134*  K 4.1 3.6 3.7  CL 104 103 103  CO2 27 26 24   GLUCOSE 143* 115* 131*  BUN 19 21 18   CREATININE 0.96 0.95 0.94  CALCIUM 8.3* 8.0* 7.2*  MG  --   --  2.1   Liver Function Tests: No results for input(s): "AST", "ALT", "ALKPHOS", "BILITOT", "PROT", "ALBUMIN" in the last 168 hours. No results for input(s): "LIPASE", "AMYLASE" in the last 168 hours. No results for input(s): "AMMONIA" in the last 168 hours. Coagulation Profile: Recent Labs  Lab 11/21/22 1111  INR 1.1   CBC: Recent Labs  Lab 11/21/22 1111 11/22/22 0407 11/23/22 0733  WBC 11.7* 6.6 9.5  NEUTROABS 9.7*  --   --   HGB 10.4* 9.3* 7.9*  HCT 32.5* 28.9* 24.5*  MCV 94.2 93.2 93.5  PLT 339 296 243   Cardiac Enzymes: No results for input(s): "CKTOTAL", "CKMB", "CKMBINDEX", "TROPONINI" in the last 168 hours. BNP: Invalid input(s): "POCBNP" CBG: No results for input(s): "GLUCAP" in the last 168 hours. HbA1C: No results for input(s): "HGBA1C" in the last 72 hours. Urine analysis:    Component Value Date/Time   COLORURINE YELLOW 11/22/2022 1200   APPEARANCEUR TURBID (A) 11/22/2022 1200   LABSPEC 1.015 11/22/2022 1200   PHURINE 5.0 11/22/2022 1200   GLUCOSEU NEGATIVE 11/22/2022 1200   HGBUR SMALL (A) 11/22/2022 1200    BILIRUBINUR NEGATIVE 11/22/2022 1200   KETONESUR NEGATIVE 11/22/2022 1200   PROTEINUR 100 (A) 11/22/2022 1200   NITRITE NEGATIVE 11/22/2022 1200   LEUKOCYTESUR SMALL (A) 11/22/2022 1200   Sepsis Labs: @LABRCNTIP (procalcitonin:4,lacticidven:4) ) Recent Results (from the past 240 hour(s))  MRSA Next Gen by PCR, Nasal     Status: None   Collection Time: 11/22/22  4:33 AM   Specimen: Nasal Mucosa; Nasal Swab  Result Value Ref Range Status   MRSA by PCR Next Gen NOT DETECTED NOT DETECTED Final    Comment: (NOTE) The GeneXpert MRSA Assay (FDA approved for NASAL specimens only), is one component of a comprehensive MRSA colonization surveillance program. It is not intended to diagnose MRSA infection nor to guide or monitor treatment for MRSA infections. Test performance is not FDA approved in patients less than 75 years old. Performed at [facility], [address], [place], [place] [address]   Urine Culture     Status: None (Preliminary result)   Collection Time: 11/22/22  12:00 PM   Specimen: Urine, Catheterized  Result Value Ref Range Status   Specimen Description   Final    URINE, CATHETERIZED Performed at [facility], [address], [place], [place] [address]    Special Requests   Final    NONE Performed at [facility], [address] [place], [place] [address]    Culture   Final    CULTURE REINCUBATED FOR BETTER GROWTH Performed at [facility], [address] [place], [place] [address]    Report Status PENDING  Incomplete     Scheduled Meds:  calcium-vitamin D  2 tablet Oral Q breakfast   Chlorhexidine Gluconate Cloth  6 each Topical Daily   donepezil  10 mg Oral Daily   feeding supplement  237 mL Oral BID BM   ferrous sulfate  325 mg Oral Once per day on Mon Thu   multivitamin with minerals  1 tablet Oral Daily   vitamin B-12  500 mcg Oral Daily   Continuous Infusions:  Procedures/Studies: DG C-Arm 1-60 Min  Result Date: 11/22/2022 CLINICAL DATA:  ORIF  right hip fracture EXAM: DG C-ARM 1-60 MIN FLUOROSCOPY: Fluoroscopy Time:  2 minutes 30 seconds Radiation Exposure Index (if provided by the fluoroscopic device): 15.861 mGy Number of Acquired Spot Images: 11 COMPARISON:  11/21/2022 FINDINGS: 11 fluoroscopic images are obtained during the performance of the procedure and are provided for interpretation only. Images demonstrate placement of an intramedullary rod with proximal and distal interlocking screws traversing the comminuted distal femoral fracture seen previously. Alignment appears near anatomic. Please refer to the operative report. IMPRESSION: 1. ORIF distal right femur fracture. Electronically Signed   By: Sharlet Salina M.D.   On: 11/22/2022 15:29   DG FEMUR PORT, MIN 2 VIEWS RIGHT  Result Date: 11/22/2022 CLINICAL DATA:  Postop right femur ORIF. EXAM: RIGHT FEMUR PORTABLE 2 VIEW COMPARISON:  11/21/2022. FINDINGS: Submitted images demonstrate placement of an intramedullary rod on from the inferior margin of the intramedullary stem of the right hip prosthesis to the intercondylar aspect of the distal femur. The primary fracture components of the distal femoral shaft fracture have been significantly reduced. There is residual displacement of the primary distal component 8 mm lateral. Orthopedic hardware appears well seated. There is surrounding soft tissue edema. IMPRESSION: 1. Significant reduction of the comminuted displaced distal right femoral shaft fracture following ORIF. Orthopedic hardware is well-seated. Electronically Signed   By: Amie Portland M.D.   On: 11/22/2022 13:14   DG Pelvis Portable  Result Date: 11/22/2022 CLINICAL DATA:  Femur fracture EXAM: PORTABLE PELVIS 1-2 VIEWS COMPARISON:  10/09/2022 FINDINGS: Previous bipolar hip replacement on the right. No evidence of acute pelvic region fracture. IMPRESSION: Previous bipolar hip replacement on the right. Electronically Signed   By: Paulina Fusi M.D.   On: 11/22/2022 12:55   DG Chest  Portable 1 View  Result Date: 11/21/2022 CLINICAL DATA:  Preop.  Right hip fracture. EXAM: PORTABLE CHEST 1 VIEW COMPARISON:  10/10/2022. FINDINGS: Cardiac silhouette mildly enlarged.  No mediastinal or hilar masses. Bilateral interstitial thickening and areas of scarring, stable from the prior exam. No evidence of pneumonia or pulmonary edema. No convincing pleural effusion or pneumothorax. Skeletal structures are diffusely demineralized. IMPRESSION: No acute cardiopulmonary disease. Electronically Signed   By: Amie Portland M.D.   On: 11/21/2022 11:42   CT Cervical Spine Wo Contrast  Result Date: 11/21/2022 CLINICAL DATA:  89 year old female status post fall walking to bathroom. Found down. Pain. EXAM: CT  CERVICAL SPINE WITHOUT CONTRAST TECHNIQUE: Multidetector CT imaging of the cervical spine was performed without intravenous contrast. Multiplanar CT image reconstructions were also generated. RADIATION DOSE REDUCTION: This exam was performed according to the departmental dose-optimization program which includes automated exposure control, adjustment of the mA and/or kV according to patient size and/or use of iterative reconstruction technique. COMPARISON:  Head CT today.  No prior cervical spine. FINDINGS: Alignment: Maintained cervical lordosis. Subtle degenerative appearing anterolisthesis of C6 on C7. Cervicothoracic junction alignment is within normal limits. Bilateral posterior element alignment is within normal limits. Skull base and vertebrae: Osteopenia. Visualized skull base is intact. No atlanto-occipital dissociation. C1 and C2 appear intact and aligned. No acute osseous abnormality identified. Soft tissues and spinal canal: No prevertebral fluid or swelling. No visible canal hematoma. Calcified carotid atherosclerosis in the neck. Otherwise negative visible noncontrast neck soft tissues. Disc levels: Mild for age cervical spine degeneration and capacious spinal canal at most levels. Upper chest:  Osteopenia. Grossly intact visible upper thoracic levels. Lung apices are clear. IMPRESSION: 1. No acute traumatic injury identified in the cervical spine. 2. Osteopenia. Mild for age cervical spine degeneration. Electronically Signed   By: Odessa Fleming M.D.   On: 11/21/2022 10:43   DG Femur Min 2 Views Right  Result Date: 11/21/2022 CLINICAL DATA:  89 year old female status post fall walking to bathroom. Found down. Pain. Status post right hip fracture and ORIF in March. EXAM: RIGHT FEMUR 2 VIEWS COMPARISON:  Preoperative right hip series 10/08/2022. postoperative pelvis radiographs 10/09/2022. FINDINGS: Four views of the right femur. The right hip arthroplasty, femoral stem appears stable and intact. But there is a highly comminuted femoral shaft fracture in the distal 3rd, beginning about fiber 6 cm distal to the implant. One full shaft width posterior displacement with pronounced posterior angulation. Displaced 8 cm butterfly fragment. Mild medial angulation. Grossly maintained right knee joint alignment with severe joint space loss. IMPRESSION: Acutely comminuted, displaced and angulated fracture of the distal 3rd right femoral shaft, 5-6 cm distal to the proximal right femoral implant. Electronically Signed   By: Odessa Fleming M.D.   On: 11/21/2022 10:40   CT Head Wo Contrast  Result Date: 11/21/2022 CLINICAL DATA:  89 year old female status post fall walking to bathroom. Found down. Pain. EXAM: CT HEAD WITHOUT CONTRAST TECHNIQUE: Contiguous axial images were obtained from the base of the skull through the vertex without intravenous contrast. RADIATION DOSE REDUCTION: This exam was performed according to the departmental dose-optimization program which includes automated exposure control, adjustment of the mA and/or kV according to patient size and/or use of iterative reconstruction technique. COMPARISON:  Head CT 10/08/2022. FINDINGS: Brain: No midline shift, mass effect, or evidence of intracranial mass lesion.  No ventriculomegaly. Chronic anterior temporal lobe atrophy more pronounced on the left (series 2, image 11). Stable cerebral volume. Stable gray-white matter differentiation throughout the brain. Patchy up to moderate bilateral periventricular white matter hypodensity. No acute intracranial hemorrhage identified. No cortically based acute infarct identified. Vascular: Calcified atherosclerosis at the skull base. No suspicious intracranial vascular hyperdensity. Skull: Stable. Osteopenia. No acute osseous abnormality identified. Sinuses/Orbits: Visualized paranasal sinuses and mastoids are stable and well aerated. Other: No orbit or scalp soft tissue injury identified. IMPRESSION: 1. No acute intracranial abnormality or acute traumatic injury identified. 2. Stable non contrast CT appearance of temporal lobe atrophy, white matter disease. Cerebral Atrophy (ICD10-G31.9). Electronically Signed   By: Odessa Fleming M.D.   On: 11/21/2022 10:38    Catarina Hartshorn, DO  [facility]  If 7PM-7AM, please contact night-coverage [email or web address] Password [email or web address] 11/23/2022, 5:34 PM   LOS: 2 days

## 2022-11-23 NOTE — Progress Notes (Signed)
   ORTHOPAEDIC PROGRESS NOTE  s/p Procedure(s): Retrograde right femoral nail for a comminuted distal femur fracture  DOS: 11/22/2022  SUBJECTIVE: No issues over night.  She is sitting at bedside.  No family in room.   OBJECTIVE: PE:  Sitting at bedside Confused, but pleasant  Right knee resting at 90 degrees.  ACE wrap is clean No strikethrough on dressings.  Toes are WWP She responds to light touch.   Vitals:   11/23/22 1441 11/23/22 1601  BP: (!) 94/50 (!) 110/47  Pulse: 86 87  Resp: 17 16  Temp: 97.9 F (36.6 C)   SpO2: 97% 96%      Latest Ref Rng & Units 11/23/2022    7:33 AM 11/22/2022    4:07 AM 11/21/2022   11:11 AM  CBC  WBC 4.0 - 10.5 K/uL 9.5  6.6  11.7   Hemoglobin 12.0 - 15.0 g/dL 7.9  9.3  91.4   Hematocrit 36.0 - 46.0 % 24.5  28.9  32.5   Platelets 150 - 400 K/uL 243  296  339      ASSESSMENT: Tamara [name] is a 89 y.o. female stable following surgery, POD#1  PLAN: Weightbearing: WBAT RLE Incisional and dressing care: Reinforce dressings as needed Orthopedic device(s): None VTE prophylaxis:  discretion of the primary team; no contraindication   Pain control: As needed Follow - up plan: 2 weeks   Contact information:     Millie Forde A. Dallas Schimke, MD MS [facility] [address] [place],  [place]  [address] Phone: [phone] Fax: [phone]

## 2022-11-23 NOTE — TOC Initial Note (Addendum)
Transition of Care [facility]) - Initial/Assessment Note    Patient Details  Name: Tamara [name] MRN: [id number] Date of Birth: March 11, 1934  Transition of Care [facility]) CM/SW Contact:    Leitha Bleak, RN Phone Number: 11/23/2022, 10:53 AM  Clinical Narrative:      Patient admitted with Closed fracture of distal end of femur. Patient released from [facility] in March. CM spoke with [name], Daughter, She is wanting [facility] again. FL2 completed and sent to [facility]. Started INS AUTH with [facility].            Expected Discharge Plan: Skilled Nursing Facility Barriers to Discharge: Continued Medical Work up, SNF Pending bed offer, Insurance Authorization   Addendum : [facility] can offer, patient only has 2 day of SNF then copay of $203 per day. CM discussed with Daughter.  They will plan for patient to stay 5-7 days and then home with home health.  She will discuss with family.   Patient Goals and CMS Choice Patient states their goals for this hospitalization and ongoing recovery are:: to return to [facility] CMS Medicare.gov Compare Post Acute Care list provided to:: Patient Represenative (must comment) Choice offered to / list presented to : Adult Children      Expected Discharge Plan and Services      Prior Living Arrangements/Services     Patient language and need for interpreter reviewed:: Yes        Need for Family Participation in Patient Care: Yes (Comment) Care giver support system in place?: Yes (comment)   Criminal Activity/Legal Involvement Pertinent to Current Situation/Hospitalization: No - Comment as needed  Activities of Daily Living Home Assistive Devices/Equipment: Walker (specify type), Wheelchair ADL Screening (condition at time of admission) Patient's cognitive ability adequate to safely complete daily activities?: Yes Is the patient deaf or have difficulty hearing?: Yes Does the patient have difficulty seeing, even when wearing glasses/contacts?: No Does the patient have difficulty  concentrating, remembering, or making decisions?: Yes Patient able to express need for assistance with ADLs?: Yes Does the patient have difficulty dressing or bathing?: No Independently performs ADLs?: Yes (appropriate for developmental age) Does the patient have difficulty walking or climbing stairs?: Yes Weakness of Legs: Both Weakness of Arms/Hands: Both  Permission Sought/Granted       Emotional Assessment      Orientation: : Oriented to Self Alcohol / Substance Use: Not Applicable Psych Involvement: No (comment)  Admission diagnosis:  Closed fracture of distal end of right femur, unspecified fracture morphology, initial encounter (HCC) [S72.401A] Closed fracture of distal end of femur, unspecified fracture morphology, initial encounter (HCC) [S72.409A] Patient Active Problem List   Diagnosis Date Noted   Closed fracture of distal end of femur, unspecified fracture morphology, initial encounter (HCC) 11/21/2022   Closed displaced comminuted fracture of shaft of right femur (HCC) 11/21/2022   Iron deficiency 10/27/2022   Aortic atherosclerosis (HCC) 10/19/2022   Hypertension associated with type 2 diabetes mellitus (HCC) 10/19/2022   Cystitis with hematuria 10/11/2022   Postoperative fever 10/10/2022   Closed fracture of right hip (HCC) 10/08/2022   Dementia without behavioral disturbance (HCC) 10/08/2022   Fall at home, initial encounter 10/08/2022   Aortic valve stenosis, nonrheumatic 03/24/2022   Pre-diabetes 03/24/2022   Disorder of mitral valve 03/24/2022   Essential (primary) hypertension 03/24/2022   Fibrosis lung (HCC) 03/24/2022   Acute blood loss anemia 12/15/2016   B12 deficiency 06/08/2016   Breast CA (HCC) 06/26/2014   Malignant neoplasm of upper-outer quadrant of RIGHT female breast (  HCC) 04/25/2013   Osteoporosis 08/10/2007   PCP:  [name], MD Pharmacy:   [facility] [address] [place] - [address] [place] [address] [place] [address] [place]  [address] Phone: [phone] Fax: [phone]    Social Determinants of Health (SDOH) Social History: SDOH Screenings   Food Insecurity: No Food Insecurity (10/08/2022)  Housing: Low Risk  (10/08/2022)  Transportation Needs: No Transportation Needs (10/08/2022)  Utilities: Not At Risk (10/08/2022)  Depression (PHQ2-9): Low Risk  (10/14/2022)  Tobacco Use: Medium Risk (11/21/2022)   SDOH Interventions:     Readmission Risk Interventions    11/23/2022   10:51 AM  Readmission Risk Prevention Plan  Transportation Screening Complete  PCP or Specialist Appt within 5-7 Days Not Complete  Home Care Screening Complete  Medication Review (RN CM) Complete

## 2022-11-23 NOTE — Evaluation (Signed)
Occupational Therapy Evaluation Patient Details Name: Tamara [name] MRN: [id number] DOB: 1934-03-16 Today's Date: 11/23/2022   History of Present Illness Tamara [name] is a 89 year old female with a history of dementia, diabetes mellitus, hypertension, right breast cancer, and B12 deficiency presenting after an unwitnessed fall with right leg pain.  The patient had a recent hospital admission from 10/08/2022 to 10/14/2022 when she had an unwitnessed fall resulting in right femoral neck fracture.  She underwent a right bipolar hip arthroplasty on 10/09/2022.  She was subsequently discharged to [facility] for STR.  She was subsequently discharged back home from STR on 10/28/2022.  Since discharge home, the patient has been able to ambulate with a walker.  She does require assistance with transfers.  According to her daughter, it appears that the amount of assistance is minimal.  However, it has been recommended that the patient not get up by herself.  Nevertheless, the patient got up early in the morning on 11/21/2022 to go to the bathroom and by herself.  She sustained a fall onto her right side and was not able to get up.  As result, EMS was activated.  There was no syncope or loss of consciousness.  The patient was awake and alert when the patient's daughter found her.  Daughter states that since the patient's last hospitalization, the patient has had more sundowning episodes and has had more confusion overall.  In the ED, the patient was afebrile and hemodynamically stable with oxygen saturation 97% room air.  WBC 11.7, hemoglobin 10.4, platelets 239,000.  Sodium 138, potassium 4.1, bicarbonate 27, creatinine 0.96.  CT of the brain was negative.  CT cervical spine was negative for traumatic injury.  X-ray of the right femur showed an acute comminuted displaced angulated fracture of the distal third of the right femoral shaft, approximately 5 to 6 cm distal to the proximal right femur implant. Status post  Retrograde femoral nail for comminuted right distal femoral shaft fracture. (Per MD)   Clinical Impression   Pt agreeable to OT and PT co-evaluation. Pt has been assisted at home since recent d/c from SNF. Pt requires assist for mobility, bathing, and dressing. Today pt required max A for bed mobility and stand pivot to chair. Much increase in pain noted with mobility of R LE. Pt required total assist for donning socks and demonstrates limited R UE A/ROM which is reportedly a baseline issue. Pt was left in the chair with chair alarm set, call bell within reach, and family present. Pt will benefit from continued OT in the hospital and recommended venue below to increase strength, balance, and endurance for safe ADL's.         Recommendations for follow up therapy are one component of a multi-disciplinary discharge planning process, led by the attending physician.  Recommendations may be updated based on patient status, additional functional criteria and insurance authorization.   Assistance Recommended at Discharge Frequent or constant Supervision/Assistance  Patient can return home with the following A lot of help with walking and/or transfers;A lot of help with bathing/dressing/bathroom;Assistance with cooking/housework;Direct supervision/assist for medications management;Assist for transportation;Help with stairs or ramp for entrance;Assistance with feeding    Functional Status Assessment  Patient has had a recent decline in their functional status and demonstrates the ability to make significant improvements in function in a reasonable and predictable amount of time.  Equipment Recommendations  None recommended by OT    Recommendations for Other Services       Precautions /  Restrictions Precautions Precautions: Fall Restrictions Weight Bearing Restrictions: Yes RLE Weight Bearing: Weight bearing as tolerated      Mobility Bed Mobility Overal bed mobility: Needs Assistance Bed  Mobility: Supine to Sit     Supine to sit: Max assist, HOB elevated     General bed mobility comments: Labored; much assist to sit up and move B LE to EOB. Much increase in pain.    Transfers Overall transfer level: Needs assistance Equipment used: Rolling walker (2 wheels) Transfers: Sit to/from Stand, Bed to chair/wheelchair/BSC Sit to Stand: Max assist     Step pivot transfers: Max assist     General transfer comment: Increase in pain. Poor B LE mobility.      Balance Overall balance assessment: Needs assistance Sitting-balance support: No upper extremity supported, Feet supported Sitting balance-Leahy Scale: Fair Sitting balance - Comments: poor/fair seated at EOB   Standing balance support: During functional activity, Bilateral upper extremity supported, Reliant on assistive device for balance Standing balance-Leahy Scale: Poor Standing balance comment: with RW                           ADL either performed or assessed with clinical judgement   ADL Overall ADL's : Needs assistance/impaired Eating/Feeding: Minimal assistance;Set up;Sitting   Grooming: Moderate assistance;Sitting   Upper Body Bathing: Moderate assistance;Sitting;Minimal assistance   Lower Body Bathing: Bed level;Maximal assistance   Upper Body Dressing : Minimal assistance;Moderate assistance;Sitting   Lower Body Dressing: Total assistance;Bed level;Maximal assistance Lower Body Dressing Details (indicate cue type and reason): Assited to don socks supine in bed. Toilet Transfer: Maximal assistance;Stand-pivot;Rolling walker (2 wheels) Toilet Transfer Details (indicate cue type and reason): Simulated via EOB to chair transfer. Toileting- Clothing Manipulation and Hygiene: Total assistance;Bed level;Maximal assistance       Functional mobility during ADLs: Maximal assistance;Rolling walker (2 wheels)       Vision Baseline Vision/History: 0 No visual deficits Ability to See in  Adequate Light: 0 Adequate Patient Visual Report: No change from baseline Vision Assessment?: No apparent visual deficits                Pertinent Vitals/Pain Pain Assessment Pain Assessment: Faces Faces Pain Scale: Hurts whole lot Pain Location: R thigh area. Pain Descriptors / Indicators: Grimacing, Guarding, Moaning Pain Intervention(s): Monitored during session, Limited activity within patient's tolerance, Repositioned, Premedicated before session     Hand Dominance Right   Extremity/Trunk Assessment Upper Extremity Assessment Upper Extremity Assessment: Generalized weakness;RUE deficits/detail RUE Deficits / Details: 2+/5 shoulder flexion (reportedly a baseline issue); P/ROM ~50% available range. Generally weak otherwise. RUE Coordination: WNL   Lower Extremity Assessment Lower Extremity Assessment: Defer to PT evaluation   Cervical / Trunk Assessment Cervical / Trunk Assessment: Kyphotic   Communication Communication Communication: No difficulties   Cognition Arousal/Alertness: Awake/alert Behavior During Therapy: Anxious Overall Cognitive Status: History of cognitive impairments - at baseline                                                        Home Living Family/patient expects to be discharged to:: Private residence Living Arrangements: Children Available Help at Discharge: Family;Personal care attendant;Other (Comment) (Not currently 24/7 but family thinks they can get 24/7 assist.) Type of Home: House Home Access: Stairs to enter Entrance  Stairs-Number of Steps: 4 Entrance Stairs-Rails: Right (going up) Home Layout: Two level;Able to live on main level with bedroom/bathroom     Bathroom Shower/Tub: Producer, television/film/video: Standard (BSC over toilet) Bathroom Accessibility: No   Home Equipment: Agricultural consultant (2 wheels);BSC/3in1;Shower seat - built in;Wheelchair - manual          Prior Functioning/Environment  Prior Level of Function : Needs assist       Physical Assist : Mobility (physical);ADLs (physical) Mobility (physical): Bed mobility;Transfers;Gait;Stairs ADLs (physical): Dressing;Bathing;IADLs Mobility Comments: Houshold ambulator with RW and assist. Assisted for bed mobility. ADLs Comments: Assist for bathing, dressing, and IADL's.        OT Problem List: Decreased strength;Decreased range of motion;Decreased activity tolerance;Impaired balance (sitting and/or standing);Decreased cognition;Decreased safety awareness;Impaired UE functional use;Pain      OT Treatment/Interventions: Self-care/ADL training;Therapeutic exercise;DME and/or AE instruction;Therapeutic activities;Patient/family education;Balance training;Cognitive remediation/compensation    OT Goals(Current goals can be found in the care plan section) Acute Rehab OT Goals Patient Stated Goal: Improve function at rehab. OT Goal Formulation: With family Time For Goal Achievement: 12/07/22 Potential to Achieve Goals: Fair  OT Frequency: Min 2X/week    Co-evaluation PT/OT/SLP Co-Evaluation/Treatment: Yes Reason for Co-Treatment: To address functional/ADL transfers   OT goals addressed during session: ADL's and self-care                       End of Session Equipment Utilized During Treatment: Rolling walker (2 wheels)  Activity Tolerance: Patient limited by pain Patient left: in chair;with call bell/phone within reach;with chair alarm set;with family/visitor present  OT Visit Diagnosis: Unsteadiness on feet (R26.81);Other abnormalities of gait and mobility (R26.89);Muscle weakness (generalized) (M62.81);History of falling (Z91.81);Repeated falls (R29.6)                Time: 1610-9604 OT Time Calculation (min): 19 min Charges:  OT General Charges $OT Visit: 1 Visit OT Evaluation $OT Eval Low Complexity: 1 Low  Yosgar Demirjian OT, MOT   Danie Chandler 11/23/2022, 9:21 AM

## 2022-11-23 NOTE — Plan of Care (Signed)
  Problem: Acute Rehab OT Goals (only OT should resolve) Goal: Pt. Will Perform Grooming Flowsheets (Taken 11/23/2022 0924) Pt Will Perform Grooming:  with min guard assist  sitting Goal: Pt. Will Perform Upper Body Bathing Flowsheets (Taken 11/23/2022 0924) Pt Will Perform Upper Body Bathing:  with min guard assist  sitting Goal: Pt. Will Perform Lower Body Bathing Flowsheets (Taken 11/23/2022 0924) Pt Will Perform Lower Body Bathing:  with mod assist  sitting/lateral leans  with adaptive equipment Goal: Pt. Will Perform Upper Body Dressing Flowsheets (Taken 11/23/2022 0924) Pt Will Perform Upper Body Dressing:  with min guard assist  sitting Goal: Pt. Will Perform Lower Body Dressing Flowsheets (Taken 11/23/2022 0924) Pt Will Perform Lower Body Dressing:  with mod assist  with adaptive equipment  sitting/lateral leans Goal: Pt. Will Transfer To Toilet Flowsheets (Taken 11/23/2022 [phone]) Pt Will Transfer to Toilet:  with min assist  with mod assist  stand pivot transfer Goal: Pt. Will Perform Toileting-Clothing Manipulation Flowsheets (Taken 11/23/2022 0924) Pt Will Perform Toileting - Clothing Manipulation and hygiene:  with min assist  sitting/lateral leans Goal: Pt/Caregiver Will Perform Home Exercise Program Flowsheets (Taken 11/23/2022 [phone]) Pt/caregiver will Perform Home Exercise Program:  Increased ROM  Increased strength  Right Upper extremity  Left upper extremity  With minimal assist  Malaya Cagley OT, MOT

## 2022-11-23 NOTE — Evaluation (Signed)
Physical Therapy Evaluation Patient Details Name: Tamara [name] MRN: [id number] DOB: 25-Nov-1933 Today's Date: 11/23/2022  History of Present Illness  Tamara [name] is a 89 year old female s/p femoral nail for comminuted right distal femoral shaft fracture on 11/22/22, with a history of dementia, diabetes mellitus, hypertension, right breast cancer, and B12 deficiency presenting after an unwitnessed fall with right leg pain.  The patient had a recent hospital admission from 10/08/2022 to 10/14/2022 when she had an unwitnessed fall resulting in right femoral neck fracture.  She underwent a right bipolar hip arthroplasty on 10/09/2022.  She was subsequently discharged to [facility] for STR.  She was subsequently discharged back home from STR on 10/28/2022.  Since discharge home, the patient has been able to ambulate with a walker.  She does require assistance with transfers.  According to her daughter, it appears that the amount of assistance is minimal.  However, it has been recommended that the patient not get up by herself.  Nevertheless, the patient got up early in the morning on 11/21/2022 to go to the bathroom and by herself.  She sustained a fall onto her right side and was not able to get up.  As result, EMS was activated.  There was no syncope or loss of consciousness.  The patient was awake and alert when the patient's daughter found her.  Daughter states that since the patient's last hospitalization, the patient has had more sundowning episodes and has had more confusion overall.  In the ED, the patient was afebrile and hemodynamically stable with oxygen saturation 97% room air.  WBC 11.7, hemoglobin 10.4, platelets 239,000.  Sodium 138, potassium 4.1, bicarbonate 27, creatinine 0.96.  CT of the brain was negative.  CT cervical spine was negative for traumatic injury.  X-ray of the right femur showed an acute comminuted displaced angulated fracture of the distal third of the right femoral shaft,  approximately 5 to 6 cm distal to the proximal right femur implant. Status post Retrograde femoral nail for comminuted right distal femoral shaft fracture.   Clinical Impression  Patient demonstrates slow labored movement for sitting up at bedside with poor tolerance for moving RLE due to increased pain, very unsteady on feet and limited to a few shuffling side steps before having to sit due to fall risk.  Patient tolerated sitting up in chair with her daughter present after therapy.  Patient will benefit from continued skilled physical therapy in hospital and recommended venue below to increase strength, balance, endurance for safe ADLs and gait.          Recommendations for follow up therapy are one component of a multi-disciplinary discharge planning process, led by the attending physician.  Recommendations may be updated based on patient status, additional functional criteria and insurance authorization.  Follow Up Recommendations Can patient physically be transported by private vehicle: No     Assistance Recommended at Discharge Intermittent Supervision/Assistance  Patient can return home with the following  A lot of help with bathing/dressing/bathroom;A lot of help with walking and/or transfers;Help with stairs or ramp for entrance;Assistance with cooking/housework    Equipment Recommendations None recommended by PT  Recommendations for Other Services       Functional Status Assessment Patient has had a recent decline in their functional status and demonstrates the ability to make significant improvements in function in a reasonable and predictable amount of time.     Precautions / Restrictions Precautions Precautions: Fall Restrictions Weight Bearing Restrictions: Yes RLE Weight Bearing: Weight  bearing as tolerated      Mobility  Bed Mobility Overal bed mobility: Needs Assistance Bed Mobility: Supine to Sit     Supine to sit: Max assist, HOB elevated     General bed  mobility comments: increased time, labored movement with c/o severe pain RLE    Transfers Overall transfer level: Needs assistance Equipment used: Rolling walker (2 wheels) Transfers: Sit to/from Stand, Bed to chair/wheelchair/BSC Sit to Stand: Max assist   Step pivot transfers: Max assist       General transfer comment: unsteady labored movement with poor tolerance for weightbearing on RLE    Ambulation/Gait Ambulation/Gait assistance: Max assist Gait Distance (Feet): 3 Feet Assistive device: Rolling walker (2 wheels) Gait Pattern/deviations: Decreased step length - right, Decreased step length - left, Decreased stance time - right, Decreased stride length, Trunk flexed, Shuffle, Antalgic Gait velocity: slow     General Gait Details: limited to a few slow labored side steps with mostly shuffling on RLE due to increased pain/weakness  Stairs            Wheelchair Mobility    Modified Rankin (Stroke Patients Only)       Balance Overall balance assessment: Needs assistance Sitting-balance support: Feet supported, No upper extremity supported Sitting balance-Leahy Scale: Fair Sitting balance - Comments: fair/poor seated at EOB   Standing balance support: During functional activity, Reliant on assistive device for balance, Bilateral upper extremity supported Standing balance-Leahy Scale: Poor Standing balance comment: with RW                             Pertinent Vitals/Pain Pain Assessment Pain Assessment: Faces Faces Pain Scale: Hurts whole lot Pain Location: R thigh area. Pain Descriptors / Indicators: Grimacing, Guarding, Moaning Pain Intervention(s): Limited activity within patient's tolerance, Monitored during session, Repositioned, Premedicated before session    Home Living Family/patient expects to be discharged to:: Private residence Living Arrangements: Children Available Help at Discharge: Family;Personal care attendant;Other  (Comment) Type of Home: House Home Access: Stairs to enter Entrance Stairs-Rails: Right Entrance Stairs-Number of Steps: 4   Home Layout: Two level;Able to live on main level with bedroom/bathroom Home Equipment: Rolling Walker (2 wheels);BSC/3in1;Shower seat - built in;Wheelchair - manual      Prior Function Prior Level of Function : Needs assist       Physical Assist : Mobility (physical);ADLs (physical) Mobility (physical): Bed mobility;Transfers;Gait;Stairs ADLs (physical): Dressing;Bathing;IADLs Mobility Comments: Houshold ambulator with RW and assist. Assisted for bed mobility. ADLs Comments: Assist for bathing, dressing, and IADL's.     Hand Dominance   Dominant Hand: Right    Extremity/Trunk Assessment   Upper Extremity Assessment Upper Extremity Assessment: Defer to OT evaluation    Lower Extremity Assessment Lower Extremity Assessment: Generalized weakness;RLE deficits/detail RLE Deficits / Details: grossly -3/5 RLE: Unable to fully assess due to pain RLE Sensation: WNL RLE Coordination: WNL    Cervical / Trunk Assessment Cervical / Trunk Assessment: Kyphotic  Communication   Communication: No difficulties  Cognition Arousal/Alertness: Awake/alert Behavior During Therapy: Anxious Overall Cognitive Status: History of cognitive impairments - at baseline                                          General Comments      Exercises     Assessment/Plan    PT Assessment Patient  needs continued PT services  PT Problem List Decreased strength;Decreased activity tolerance;Decreased balance;Decreased mobility;Pain       PT Treatment Interventions DME instruction;Gait training;Stair training;Functional mobility training;Therapeutic activities;Therapeutic exercise;Balance training;Patient/family education    PT Goals (Current goals can be found in the Care Plan section)  Acute Rehab PT Goals Patient Stated Goal: return home after rehab PT  Goal Formulation: With patient/family Time For Goal Achievement: 12/07/22 Potential to Achieve Goals: Good    Frequency Min 3X/week     Co-evaluation PT/OT/SLP Co-Evaluation/Treatment: Yes Reason for Co-Treatment: To address functional/ADL transfers PT goals addressed during session: Mobility/safety with mobility;Balance;Proper use of DME         AM-PAC PT "6 Clicks" Mobility  Outcome Measure Help needed turning from your back to your side while in a flat bed without using bedrails?: A Lot Help needed moving from lying on your back to sitting on the side of a flat bed without using bedrails?: A Lot Help needed moving to and from a bed to a chair (including a wheelchair)?: A Lot Help needed standing up from a chair using your arms (e.g., wheelchair or bedside chair)?: A Lot Help needed to walk in hospital room?: A Lot Help needed climbing 3-5 steps with a railing? : Total 6 Click Score: 11    End of Session   Activity Tolerance: Patient tolerated treatment well;Patient limited by fatigue;Patient limited by pain Patient left: in chair;with call bell/phone within reach;with family/visitor present Nurse Communication: Mobility status PT Visit Diagnosis: Unsteadiness on feet (R26.81);Other abnormalities of gait and mobility (R26.89);Muscle weakness (generalized) (M62.81)    Time: 4540-9811 PT Time Calculation (min) (ACUTE ONLY): 21 min   Charges:   PT Evaluation $PT Eval Moderate Complexity: 1 Mod PT Treatments $Therapeutic Activity: 8-22 mins        1:45 PM, 11/23/22 Ocie Bob, MPT Physical Therapist with [facility] [phone] office [phone] mobile phone

## 2022-11-23 NOTE — Plan of Care (Signed)
  Problem: Acute Rehab PT Goals(only PT should resolve) Goal: Pt Will Go Supine/Side To Sit Outcome: Progressing Flowsheets (Taken 11/23/2022 1347) Pt will go Supine/Side to Sit: with moderate assist Goal: Patient Will Transfer Sit To/From Stand Outcome: Progressing Flowsheets (Taken 11/23/2022 1347) Patient will transfer sit to/from stand: with moderate assist Goal: Pt Will Transfer Bed To Chair/Chair To Bed Outcome: Progressing Flowsheets (Taken 11/23/2022 1347) Pt will Transfer Bed to Chair/Chair to Bed: with mod assist Goal: Pt Will Ambulate Outcome: Progressing Flowsheets (Taken 11/23/2022 1347) Pt will Ambulate:  15 feet  with moderate assist  with rolling walker   1:48 PM, 11/23/22 Ocie Bob, MPT Physical Therapist with [facility] [phone] office [phone] mobile phone

## 2022-11-23 NOTE — [place] FL2 (Signed)
[place] MEDICAID FL2 LEVEL OF CARE FORM     IDENTIFICATION  Patient Name: Tamara [name] Birthdate: [birth year] Sex: female Admission Date (Current Location): 11/21/2022  [facility] and [place] Number:  [employer] and Address:  [facility],  [address] [place] [address]      Provider Number: [id number]  Attending Physician Name and Address:  Catarina Hartshorn, MD  Relative Name and Phone Number:  Matthias Hughs (Daughter) [phone]    Current Level of Care: Hospital Recommended Level of Care: Skilled Nursing Facility Prior Approval Number:    Date Approved/Denied:   PASRR Number: [id number]  Discharge Plan: SNF    Current Diagnoses: Patient Active Problem List   Diagnosis Date Noted   Closed fracture of distal end of femur, unspecified fracture morphology, initial encounter (HCC) 11/21/2022   Closed displaced comminuted fracture of shaft of right femur (HCC) 11/21/2022   Iron deficiency 10/27/2022   Aortic atherosclerosis (HCC) 10/19/2022   Hypertension associated with type 2 diabetes mellitus (HCC) 10/19/2022   Cystitis with hematuria 10/11/2022   Postoperative fever 10/10/2022   Closed fracture of right hip (HCC) 10/08/2022   Dementia without behavioral disturbance (HCC) 10/08/2022   Fall at home, initial encounter 10/08/2022   Aortic valve stenosis, nonrheumatic 03/24/2022   Pre-diabetes 03/24/2022   Disorder of mitral valve 03/24/2022   Essential (primary) hypertension 03/24/2022   Fibrosis lung (HCC) 03/24/2022   Acute blood loss anemia 12/15/2016   B12 deficiency 06/08/2016   Breast CA (HCC) 06/26/2014   Malignant neoplasm of upper-outer quadrant of RIGHT female breast (HCC) 04/25/2013   Osteoporosis 08/10/2007    Orientation RESPIRATION BLADDER Height & Weight     Self  Normal External catheter Weight: 63.5 kg Height:  5\' 2"  (157.5 cm)  BEHAVIORAL SYMPTOMS/MOOD NEUROLOGICAL BOWEL NUTRITION STATUS      Continent Diet  AMBULATORY  STATUS COMMUNICATION OF NEEDS Skin   Extensive Assist Verbally Surgical wounds                       Personal Care Assistance Level of Assistance  Bathing, Feeding, Dressing Bathing Assistance: Maximum assistance Feeding assistance: Limited assistance Dressing Assistance: Maximum assistance     Functional Limitations Info  Sight, Speech, Hearing Sight Info: Adequate Hearing Info: Adequate Speech Info: Impaired    SPECIAL CARE FACTORS FREQUENCY  PT (By licensed PT)     PT Frequency: 5 times a week              Contractures Contractures Info: Not present    Additional Factors Info  Code Status, Allergies Code Status Info: FULL Allergies Info: NKDA           Current Medications (11/23/2022):  This is the current hospital active medication list Current Facility-Administered Medications  Medication Dose Route Frequency Provider Last Rate Last Admin   calcium-vitamin D (OSCAL WITH D) 500-5 MG-MCG per tablet 2 tablet  2 tablet Oral Q breakfast Oliver Barre, MD   2 tablet at 11/23/22 0830   Chlorhexidine Gluconate Cloth 2 % PADS 6 each  6 each Topical Daily Tat, David, MD   6 each at 11/22/22 1420   donepezil (ARICEPT) tablet 10 mg  10 mg Oral Daily Oliver Barre, MD   10 mg at 11/22/22 2138   feeding supplement (ENSURE ENLIVE / ENSURE PLUS) liquid 237 mL  237 mL Oral BID BM Oliver Barre, MD   237 mL at 11/23/22 0911   ferrous sulfate  tablet 325 mg  325 mg Oral Once per day on Mon Thu Cairns, Redge Gainer, MD   325 mg at 11/23/22 1610   HYDROcodone-acetaminophen (NORCO/VICODIN) 5-325 MG per tablet 1-2 tablet  1-2 tablet Oral Q6H PRN Oliver Barre, MD   1 tablet at 11/23/22 9604   HYDROmorphone (DILAUDID) injection 0.5 mg  0.5 mg Intravenous Q2H PRN Oliver Barre, MD       multivitamin with minerals tablet 1 tablet  1 tablet Oral Daily Oliver Barre, MD   1 tablet at 11/23/22 0831   vitamin B-12 (CYANOCOBALAMIN) tablet 500 mcg  500 mcg Oral Daily Catarina Hartshorn, MD   500 mcg  at 11/23/22 5409     Discharge Medications: Please see discharge summary for a list of discharge medications.  Relevant Imaging Results:  Relevant Lab Results:   Additional Information SS# [id number]  Leitha Bleak, RN

## 2022-11-24 DIAGNOSIS — S72351D Displaced comminuted fracture of shaft of right femur, subsequent encounter for closed fracture with routine healing: Secondary | ICD-10-CM | POA: Diagnosis not present

## 2022-11-24 DIAGNOSIS — S72409A Unspecified fracture of lower end of unspecified femur, initial encounter for closed fracture: Secondary | ICD-10-CM | POA: Diagnosis not present

## 2022-11-24 DIAGNOSIS — I152 Hypertension secondary to endocrine disorders: Secondary | ICD-10-CM | POA: Diagnosis not present

## 2022-11-24 DIAGNOSIS — E1159 Type 2 diabetes mellitus with other circulatory complications: Secondary | ICD-10-CM | POA: Diagnosis not present

## 2022-11-24 LAB — TYPE AND SCREEN: ABO/RH(D): O POS

## 2022-11-24 LAB — PREPARE RBC (CROSSMATCH)

## 2022-11-24 LAB — BASIC METABOLIC PANEL
Anion gap: 8 (ref 5–15)
BUN: 22 mg/dL (ref 8–23)
CO2: 24 mmol/L (ref 22–32)
Calcium: 7.3 mg/dL — ABNORMAL LOW (ref 8.9–10.3)
Chloride: 103 mmol/L (ref 98–111)
Creatinine, Ser: 1.02 mg/dL — ABNORMAL HIGH (ref 0.44–1.00)
GFR, Estimated: 54 mL/min — ABNORMAL LOW (ref >60)
Glucose, Bld: 97 mg/dL (ref 70–99)
Potassium: 3.6 mmol/L (ref 3.5–5.1)
Sodium: 135 mmol/L (ref 135–145)

## 2022-11-24 LAB — CBC
HCT: 22.2 % — ABNORMAL LOW (ref 36.0–46.0)
Hemoglobin: 7.2 g/dL — ABNORMAL LOW (ref 12.0–15.0)
MCH: 30.5 pg (ref 26.0–34.0)
MCHC: 32.4 g/dL (ref 30.0–36.0)
MCV: 94.1 fL (ref 80.0–100.0)
Platelets: 231 10*3/uL (ref 150–400)
RBC: 2.36 MIL/uL — ABNORMAL LOW (ref 3.87–5.11)
RDW: 14.4 % (ref 11.5–15.5)
WBC: 8.5 10*3/uL (ref 4.0–10.5)
nRBC: 0 % (ref 0.0–0.2)

## 2022-11-24 LAB — MAGNESIUM: Magnesium: 2.2 mg/dL (ref 1.7–2.4)

## 2022-11-24 LAB — BPAM RBC
Blood Product Expiration Date: 202406082359
Unit Type and Rh: 5100

## 2022-11-24 MED ORDER — AMOXICILLIN 250 MG PO CAPS
500.0000 mg | ORAL_CAPSULE | Freq: Three times a day (TID) | ORAL | Status: DC
  Administered 2022-11-24 – 2022-11-25 (×3): 500 mg via ORAL
  Filled 2022-11-24 (×3): qty 2

## 2022-11-24 MED ORDER — SODIUM CHLORIDE 0.9% IV SOLUTION: Freq: Once | INTRAVENOUS | Status: AC

## 2022-11-24 MED ORDER — FLUCONAZOLE 150 MG PO TABS
150.0000 mg | ORAL_TABLET | Freq: Once | ORAL | Status: AC
  Administered 2022-11-24: 150 mg via ORAL
  Filled 2022-11-24: qty 1

## 2022-11-24 NOTE — Progress Notes (Signed)
EMERGENCY BLOOD PRODUCT NOTE  Compare the patient ID on the blood tag to the patient ID on the hospital armband and Blood Bank armband. Then confirm the unit number on the blood tag matches the unit number on the blood product.  If a discrepancy is discovered return the product to blood bank immediately.   Blood Product Type:Packed Red Blood Cells   Unit #: (Found on blood product bag, begins with W) [id number]  Product Code #: (Found on blood product bag, begins with E) [id number]  Start Time: 1138  Starting Rate: 120 ml/hr  Rate increase/decreased  (if applicable):  130    ml/hr  Rate changed time (if applicable):   Verified with Dellia Cloud, RN  Stop Time: 1435  All Other Documentation should be documented within the Blood Admin Flowsheet per policy.  Fritzi Mandes 11:35 AM

## 2022-11-24 NOTE — Progress Notes (Addendum)
PROGRESS NOTE  Tamara [name]:[id number] DOB: 1934/02/24 DOA: 11/21/2022 PCP: [name], MD  Brief History:  89 year old female with a history of dementia, diabetes mellitus, hypertension, right breast cancer, and B12 deficiency presenting after an unwitnessed fall with right leg pain.  The patient had a recent hospital admission from 10/08/2022 to 10/14/2022 when she had an unwitnessed fall resulting in right femoral neck fracture.  She underwent a right bipolar hip arthroplasty on 10/09/2022.  She was subsequently discharged to [facility] for STR.  She was subsequently discharged back home from STR on 10/28/2022.  Since discharge home, the patient has been able to ambulate with a walker.  She does require assistance with transfers.  According to her daughter, it appears that the amount of assistance is minimal.  However, it has been recommended that the patient not get up by herself.  Nevertheless, the patient got up early in the morning on 11/21/2022 to go to the bathroom and by herself.  She sustained a fall onto her right side and was not able to get up.  As result, EMS was activated.  There was no syncope or loss of consciousness.  The patient was awake and alert when the patient's daughter found her.  Daughter states that since the patient's last hospitalization, the patient has had more sundowning episodes and has had more confusion overall. In the ED, the patient was afebrile and hemodynamically stable with oxygen saturation 97% room air.  WBC 11.7, hemoglobin 10.4, platelets 239,000.  Sodium 138, potassium 4.1, bicarbonate 27, creatinine 0.96.  CT of the brain was negative.  CT cervical spine was negative for traumatic injury.  X-ray of the right femur showed an acute comminuted displaced angulated fracture of the distal third of the right femoral shaft, approximately 5 to 6 cm distal to the proximal right femur implant. Orthopedics was consulted.  Dr. Dallas Schimke plans for operative  intervention on 11/21/22. Pt was taken to OR on 11/22/22 by Dr. Dallas Schimke who placed a supracondylar nail.  PT was consulted on post op D#1   Assessment/Plan:  Right distal femur fracture -Ortho--Dr. Dallas Schimke consulted -11/22/22--supracondylar nail placement -start lovenox today POD#1 -judicious opioids -PT eval on 11/23/22>>SNF   Dementia without behavioral disturbance -Continue Aricept -pt had hospital delirium during last hospitalization   Essential hypertension -Holding amlodipine and losartan temporarily -BP soft>>improved, remains well controlled off anti-HTN meds   Diabetes mellitus type 2, controlled -Not on any medications as an outpatient -10/08/22 hemoglobin A1c--5.9   Right breast cancer -Currently in remission -Status post lumpectomy and lymph node dissection August 2014 -Patient declined radiation therapy -s/p Aromasin October 2014, continued till October 2019.  -Follow-up Dr. Ellin Saba   UTI--Lactobacillus -5/5 UA >50 WBC -start amoxil x 3 days   B12 deficiency -B12--167 -give B12 inj, then started po   Iron deficiency -given nulecit x 1 on 11/22/22 -iron saturation 9%, iron 23, ferritin 205 -continue po ferrous sulfate   Acute Anemia -5/7--transfuse one unit PRBC         Family Communication:  son updated at bedside 5/7   Consultants:  ortho--Dr. Dallas Schimke   Code Status:  FULL   DVT Prophylaxis:  on hold for surgery--plan start on POD#1     Procedures: As Listed in Progress Note Above   Antibiotics: Amoxil 5/7>>        Subjective: Pt denies cp, sob, abd pain.  Right leg pain controlled  Objective: Vitals:  11/24/22 1121 11/24/22 1153 11/24/22 1300 11/24/22 1438  BP: (!) 89/42 92/79 (!) 101/53 (!) 97/56  Pulse: 76 71 71 78  Resp: 16 16 17 16   Temp: 98.6 F (37 C) 98.4 F (36.9 C) 97.8 F (36.6 C) 98.4 F (36.9 C)  TempSrc: Oral Oral Oral Oral  SpO2: 96% 97% 97% 99%  Weight:      Height:        Intake/Output Summary (Last 24  hours) at 11/24/2022 1716 Last data filed at 11/24/2022 1641 Gross per 24 hour  Intake 480 ml  Output 700 ml  Net -220 ml   Weight change:  Exam:  General:  Pt is alert, follows commands appropriately, not in acute distress HEENT: No icterus, No thrush, No neck mass, [place]/AT Cardiovascular: RRR, S1/S2, no rubs, no gallops Respiratory: bibasilar crackles.  No wheeze Abdomen: Soft/+BS, non tender, non distended, no guarding Extremities: No edema, No lymphangitis, No petechiae, No rashes, no synovitis   Data Reviewed: I have personally reviewed following labs and imaging studies Basic Metabolic Panel: Recent Labs  Lab 11/21/22 1111 11/22/22 0407 11/23/22 0733 11/24/22 0356  NA 138 137 134* 135  K 4.1 3.6 3.7 3.6  CL 104 103 103 103  CO2 27 26 24 24   GLUCOSE 143* 115* 131* 97  BUN 19 21 18 22   CREATININE 0.96 0.95 0.94 1.02*  CALCIUM 8.3* 8.0* 7.2* 7.3*  MG  --   --  2.1 2.2   Liver Function Tests: No results for input(s): "AST", "ALT", "ALKPHOS", "BILITOT", "PROT", "ALBUMIN" in the last 168 hours. No results for input(s): "LIPASE", "AMYLASE" in the last 168 hours. No results for input(s): "AMMONIA" in the last 168 hours. Coagulation Profile: Recent Labs  Lab 11/21/22 1111  INR 1.1   CBC: Recent Labs  Lab 11/21/22 1111 11/22/22 0407 11/23/22 0733 11/24/22 0356  WBC 11.7* 6.6 9.5 8.5  NEUTROABS 9.7*  --   --   --   HGB 10.4* 9.3* 7.9* 7.2*  HCT 32.5* 28.9* 24.5* 22.2*  MCV 94.2 93.2 93.5 94.1  PLT 339 296 243 231   Cardiac Enzymes: No results for input(s): "CKTOTAL", "CKMB", "CKMBINDEX", "TROPONINI" in the last 168 hours. BNP: Invalid input(s): "POCBNP" CBG: No results for input(s): "GLUCAP" in the last 168 hours. HbA1C: No results for input(s): "HGBA1C" in the last 72 hours. Urine analysis:    Component Value Date/Time   COLORURINE YELLOW 11/22/2022 1200   APPEARANCEUR TURBID (A) 11/22/2022 1200   LABSPEC 1.015 11/22/2022 1200   PHURINE 5.0 11/22/2022  1200   GLUCOSEU NEGATIVE 11/22/2022 1200   HGBUR SMALL (A) 11/22/2022 1200   BILIRUBINUR NEGATIVE 11/22/2022 1200   KETONESUR NEGATIVE 11/22/2022 1200   PROTEINUR 100 (A) 11/22/2022 1200   NITRITE NEGATIVE 11/22/2022 1200   LEUKOCYTESUR SMALL (A) 11/22/2022 1200   Sepsis Labs: @LABRCNTIP (procalcitonin:4,lacticidven:4) ) Recent Results (from the past 240 hour(s))  MRSA Next Gen by PCR, Nasal     Status: None   Collection Time: 11/22/22  4:33 AM   Specimen: Nasal Mucosa; Nasal Swab  Result Value Ref Range Status   MRSA by PCR Next Gen NOT DETECTED NOT DETECTED Final    Comment: (NOTE) The GeneXpert MRSA Assay (FDA approved for NASAL specimens only), is one component of a comprehensive MRSA colonization surveillance program. It is not intended to diagnose MRSA infection nor to guide or monitor treatment for MRSA infections. Test performance is not FDA approved in patients less than 70 years old. Performed at [facility]  [facility], [address] [place], [place] [address]   Urine Culture     Status: Abnormal   Collection Time: 11/22/22 12:00 PM   Specimen: Urine, Catheterized  Result Value Ref Range Status   Specimen Description   Final    URINE, CATHETERIZED Performed at [facility], [address] [place], [place] [address]    Special Requests   Final    NONE Performed at [facility], [address], [place], [place] [address]    Culture (A)  Final    >=100,000 COLONIES/mL LACTOBACILLUS SPECIES Standardized susceptibility testing for this organism is not available. >=100,000 COLONIES/mL YEAST    Report Status 11/23/2022 FINAL  Final     Scheduled Meds:  calcium-vitamin D  2 tablet Oral Q breakfast   Chlorhexidine Gluconate Cloth  6 each Topical Daily   donepezil  10 mg Oral Daily   feeding supplement  237 mL Oral BID BM   ferrous sulfate  325 mg Oral Once per day on Mon Thu   multivitamin with minerals  1 tablet Oral Daily   vitamin B-12  500 mcg Oral Daily   Continuous  Infusions:  Procedures/Studies: DG C-Arm 1-60 Min  Result Date: 11/22/2022 CLINICAL DATA:  ORIF right hip fracture EXAM: DG C-ARM 1-60 MIN FLUOROSCOPY: Fluoroscopy Time:  2 minutes 30 seconds Radiation Exposure Index (if provided by the fluoroscopic device): 15.861 mGy Number of Acquired Spot Images: 11 COMPARISON:  11/21/2022 FINDINGS: 11 fluoroscopic images are obtained during the performance of the procedure and are provided for interpretation only. Images demonstrate placement of an intramedullary rod with proximal and distal interlocking screws traversing the comminuted distal femoral fracture seen previously. Alignment appears near anatomic. Please refer to the operative report. IMPRESSION: 1. ORIF distal right femur fracture. Electronically Signed   By: Sharlet Salina M.D.   On: 11/22/2022 15:29   DG FEMUR PORT, MIN 2 VIEWS RIGHT  Result Date: 11/22/2022 CLINICAL DATA:  Postop right femur ORIF. EXAM: RIGHT FEMUR PORTABLE 2 VIEW COMPARISON:  11/21/2022. FINDINGS: Submitted images demonstrate placement of an intramedullary rod on from the inferior margin of the intramedullary stem of the right hip prosthesis to the intercondylar aspect of the distal femur. The primary fracture components of the distal femoral shaft fracture have been significantly reduced. There is residual displacement of the primary distal component 8 mm lateral. Orthopedic hardware appears well seated. There is surrounding soft tissue edema. IMPRESSION: 1. Significant reduction of the comminuted displaced distal right femoral shaft fracture following ORIF. Orthopedic hardware is well-seated. Electronically Signed   By: Amie Portland M.D.   On: 11/22/2022 13:14   DG Pelvis Portable  Result Date: 11/22/2022 CLINICAL DATA:  Femur fracture EXAM: PORTABLE PELVIS 1-2 VIEWS COMPARISON:  10/09/2022 FINDINGS: Previous bipolar hip replacement on the right. No evidence of acute pelvic region fracture. IMPRESSION: Previous bipolar hip  replacement on the right. Electronically Signed   By: Paulina Fusi M.D.   On: 11/22/2022 12:55   DG Chest Portable 1 View  Result Date: 11/21/2022 CLINICAL DATA:  Preop.  Right hip fracture. EXAM: PORTABLE CHEST 1 VIEW COMPARISON:  10/10/2022. FINDINGS: Cardiac silhouette mildly enlarged.  No mediastinal or hilar masses. Bilateral interstitial thickening and areas of scarring, stable from the prior exam. No evidence of pneumonia or pulmonary edema. No convincing pleural effusion or pneumothorax. Skeletal structures are diffusely demineralized. IMPRESSION: No acute cardiopulmonary disease. Electronically Signed   By: Amie Portland M.D.   On: 11/21/2022 11:42   CT Cervical Spine Wo  Contrast  Result Date: 11/21/2022 CLINICAL DATA:  89 year old female status post fall walking to bathroom. Found down. Pain. EXAM: CT CERVICAL SPINE WITHOUT CONTRAST TECHNIQUE: Multidetector CT imaging of the cervical spine was performed without intravenous contrast. Multiplanar CT image reconstructions were also generated. RADIATION DOSE REDUCTION: This exam was performed according to the departmental dose-optimization program which includes automated exposure control, adjustment of the mA and/or kV according to patient size and/or use of iterative reconstruction technique. COMPARISON:  Head CT today.  No prior cervical spine. FINDINGS: Alignment: Maintained cervical lordosis. Subtle degenerative appearing anterolisthesis of C6 on C7. Cervicothoracic junction alignment is within normal limits. Bilateral posterior element alignment is within normal limits. Skull base and vertebrae: Osteopenia. Visualized skull base is intact. No atlanto-occipital dissociation. C1 and C2 appear intact and aligned. No acute osseous abnormality identified. Soft tissues and spinal canal: No prevertebral fluid or swelling. No visible canal hematoma. Calcified carotid atherosclerosis in the neck. Otherwise negative visible noncontrast neck soft tissues.  Disc levels: Mild for age cervical spine degeneration and capacious spinal canal at most levels. Upper chest: Osteopenia. Grossly intact visible upper thoracic levels. Lung apices are clear. IMPRESSION: 1. No acute traumatic injury identified in the cervical spine. 2. Osteopenia. Mild for age cervical spine degeneration. Electronically Signed   By: Odessa Fleming M.D.   On: 11/21/2022 10:43   DG Femur Min 2 Views Right  Result Date: 11/21/2022 CLINICAL DATA:  89 year old female status post fall walking to bathroom. Found down. Pain. Status post right hip fracture and ORIF in March. EXAM: RIGHT FEMUR 2 VIEWS COMPARISON:  Preoperative right hip series 10/08/2022. postoperative pelvis radiographs 10/09/2022. FINDINGS: Four views of the right femur. The right hip arthroplasty, femoral stem appears stable and intact. But there is a highly comminuted femoral shaft fracture in the distal 3rd, beginning about fiber 6 cm distal to the implant. One full shaft width posterior displacement with pronounced posterior angulation. Displaced 8 cm butterfly fragment. Mild medial angulation. Grossly maintained right knee joint alignment with severe joint space loss. IMPRESSION: Acutely comminuted, displaced and angulated fracture of the distal 3rd right femoral shaft, 5-6 cm distal to the proximal right femoral implant. Electronically Signed   By: Odessa Fleming M.D.   On: 11/21/2022 10:40   CT Head Wo Contrast  Result Date: 11/21/2022 CLINICAL DATA:  89 year old female status post fall walking to bathroom. Found down. Pain. EXAM: CT HEAD WITHOUT CONTRAST TECHNIQUE: Contiguous axial images were obtained from the base of the skull through the vertex without intravenous contrast. RADIATION DOSE REDUCTION: This exam was performed according to the departmental dose-optimization program which includes automated exposure control, adjustment of the mA and/or kV according to patient size and/or use of iterative reconstruction technique. COMPARISON:   Head CT 10/08/2022. FINDINGS: Brain: No midline shift, mass effect, or evidence of intracranial mass lesion. No ventriculomegaly. Chronic anterior temporal lobe atrophy more pronounced on the left (series 2, image 11). Stable cerebral volume. Stable gray-white matter differentiation throughout the brain. Patchy up to moderate bilateral periventricular white matter hypodensity. No acute intracranial hemorrhage identified. No cortically based acute infarct identified. Vascular: Calcified atherosclerosis at the skull base. No suspicious intracranial vascular hyperdensity. Skull: Stable. Osteopenia. No acute osseous abnormality identified. Sinuses/Orbits: Visualized paranasal sinuses and mastoids are stable and well aerated. Other: No orbit or scalp soft tissue injury identified. IMPRESSION: 1. No acute intracranial abnormality or acute traumatic injury identified. 2. Stable non contrast CT appearance of temporal lobe atrophy, white matter disease. Cerebral Atrophy (ICD10-G31.9).  Electronically Signed   By: Odessa Fleming M.D.   On: 11/21/2022 10:38    Catarina Hartshorn, DO  [facility]  If 7PM-7AM, please contact night-coverage [email or web address] Password [email or web address] 11/24/2022, 5:16 PM   LOS: 3 days

## 2022-11-24 NOTE — Progress Notes (Signed)
Patient lying in bed with eyes closed resting. In the beginning of the shift, pt. Started getting more confused and attempted to get out of bed. Pt. Also grimaced several times.Pain assessment, pt.scored a 5 on a 0-10 pain scale, PRN pain medication given. Upon, reassessment pt.lying in bed with eyes closed resting. Pt. Has remained in bed resting, with awakening for vitals, medications, and pt. Check rounding's. All safety measures maintained in place for pt. Safety. Will continue to monitor and report off to oncoming shift.

## 2022-11-24 NOTE — TOC Progression Note (Signed)
Transition of Care [facility]) - Progression Note    Patient Details  Name: Tamara [name] MRN: [id number] Date of Birth: 1933-11-15  Transition of Care [facility]) CM/SW Contact  Leitha Bleak, RN Phone Number: 11/24/2022, 3:42 PM  Clinical Narrative:   HTA approved for 7 days  Auth # [id number].     Expected Discharge Plan: Skilled Nursing Facility Barriers to Discharge: Continued Medical Work up, SNF Pending bed offer, English as a second language teacher  Expected Discharge Plan and Services    [facility]  Social Determinants of Health (SDOH) Interventions SDOH Screenings   Food Insecurity: No Food Insecurity (10/08/2022)  Housing: Low Risk  (10/08/2022)  Transportation Needs: No Transportation Needs (10/08/2022)  Utilities: Not At Risk (10/08/2022)  Depression (PHQ2-9): Low Risk  (10/14/2022)  Tobacco Use: Medium Risk (11/21/2022)    Readmission Risk Interventions    11/23/2022   10:51 AM  Readmission Risk Prevention Plan  Transportation Screening Complete  PCP or Specialist Appt within 5-7 Days Not Complete  Home Care Screening Complete  Medication Review (RN CM) Complete

## 2022-11-24 NOTE — Progress Notes (Addendum)
Physical Therapy Treatment Patient Details Name: Tamara [name] MRN: [id number] DOB: Feb 24, 1934 Today's Date: 11/24/2022   History of Present Illness Tamara [name] is a 89 year old female s/p femoral nail for comminuted right distal femoral shaft fracture on 11/22/22, with a history of dementia, diabetes mellitus, hypertension, right breast cancer, and B12 deficiency presenting after an unwitnessed fall with right leg pain.  The patient had a recent hospital admission from 10/08/2022 to 10/14/2022 when she had an unwitnessed fall resulting in right femoral neck fracture.  She underwent a right bipolar hip arthroplasty on 10/09/2022.  She was subsequently discharged to [facility] for STR.  She was subsequently discharged back home from STR on 10/28/2022.  Since discharge home, the patient has been able to ambulate with a walker.  She does require assistance with transfers.  According to her daughter, it appears that the amount of assistance is minimal.  However, it has been recommended that the patient not get up by herself.  Nevertheless, the patient got up early in the morning on 11/21/2022 to go to the bathroom and by herself.  She sustained a fall onto her right side and was not able to get up.  As result, EMS was activated.  There was no syncope or loss of consciousness.  The patient was awake and alert when the patient's daughter found her.  Daughter states that since the patient's last hospitalization, the patient has had more sundowning episodes and has had more confusion overall.  In the ED, the patient was afebrile and hemodynamically stable with oxygen saturation 97% room air.  WBC 11.7, hemoglobin 10.4, platelets 239,000.  Sodium 138, potassium 4.1, bicarbonate 27, creatinine 0.96.  CT of the brain was negative.  CT cervical spine was negative for traumatic injury.  X-ray of the right femur showed an acute comminuted displaced angulated fracture of the distal third of the right femoral shaft,  approximately 5 to 6 cm distal to the proximal right femur implant. Status post Retrograde femoral nail for comminuted right distal femoral shaft fracture.    PT Comments    Patient apprehensive for getting up due to right hip pain and agreeable for therapy after encouragement.  Patient had most difficulty moving RLE due to pain during bed mobility, once seated demonstrates fair/good carryover for completing BLE exercises with verbal cueing and demonstration.  Patient required repeated attempts before able to complete sit to stands and tolerated taking a few side steps before having to sit due to weakness and right hip pain.  Patient tolerated sitting up in chair with family member present after therapy.  Patient will benefit from continued skilled physical therapy in hospital and recommended venue below to increase strength, balance, endurance for safe ADLs and gait.    Recommendations for follow up therapy are one component of a multi-disciplinary discharge planning process, led by the attending physician.  Recommendations may be updated based on patient status, additional functional criteria and insurance authorization.  Follow Up Recommendations  Can patient physically be transported by private vehicle: No    Assistance Recommended at Discharge    Patient can return home with the following A lot of help with bathing/dressing/bathroom;A lot of help with walking and/or transfers;Help with stairs or ramp for entrance;Assistance with cooking/housework   Equipment Recommendations  None recommended by PT    Recommendations for Other Services       Precautions / Restrictions Precautions Precautions: Fall Restrictions Weight Bearing Restrictions: Yes RLE Weight Bearing: Weight bearing as tolerated  Mobility  Bed Mobility Overal bed mobility: Needs Assistance Bed Mobility: Supine to Sit     Supine to sit: Max assist, HOB elevated     General bed mobility comments: slow labored  movement with c/o severe pain in right hip    Transfers Overall transfer level: Needs assistance Equipment used: Rolling walker (2 wheels) Transfers: Sit to/from Stand, Bed to chair/wheelchair/BSC Sit to Stand: Max assist   Step pivot transfers: Mod assist, Max assist       General transfer comment: requires repeated verbal/tactile cueing for completing sit to stands, but once on feet able to transfer chair    Ambulation/Gait Ambulation/Gait assistance: Max assist, Mod assist Gait Distance (Feet): 4 Feet Assistive device: Rolling walker (2 wheels) Gait Pattern/deviations: Decreased step length - right, Decreased step length - left, Decreased stance time - right, Decreased stride length, Trunk flexed, Shuffle, Antalgic Gait velocity: slow     General Gait Details: patient limited to a few side steps due to increased right hip pain and fear of falling   Stairs             Wheelchair Mobility    Modified Rankin (Stroke Patients Only)       Balance Overall balance assessment: Needs assistance Sitting-balance support: Feet supported, No upper extremity supported Sitting balance-Leahy Scale: Fair Sitting balance - Comments: fair/good seated at EOB   Standing balance support: During functional activity, Reliant on assistive device for balance, Bilateral upper extremity supported Standing balance-Leahy Scale: Poor Standing balance comment: using RW                            Cognition Arousal/Alertness: Awake/alert Behavior During Therapy: Anxious Overall Cognitive Status: History of cognitive impairments - at baseline                                          Exercises General Exercises - Lower Extremity Long Arc Quad: Seated, AROM, AAROM, Strengthening, Both, 10 reps Hip Flexion/Marching: Seated, AAROM, Both, 10 reps, AROM Toe Raises: Seated, AROM, Strengthening, 10 reps, Both Heel Raises: Seated, AROM, Strengthening, Both, 10  reps    General Comments        Pertinent Vitals/Pain Pain Assessment Pain Assessment: Faces Faces Pain Scale: Hurts whole lot Pain Location: right hip Pain Descriptors / Indicators: Grimacing, Guarding, Moaning Pain Intervention(s): Limited activity within patient's tolerance, Monitored during session, Premedicated before session, Repositioned    Home Living                          Prior Function            PT Goals (current goals can now be found in the care plan section) Acute Rehab PT Goals Patient Stated Goal: return home after rehab PT Goal Formulation: With patient/family Time For Goal Achievement: 12/07/22 Potential to Achieve Goals: Good Progress towards PT goals: Progressing toward goals    Frequency    Min 3X/week      PT Plan Current plan remains appropriate    Co-evaluation              AM-PAC PT "6 Clicks" Mobility   Outcome Measure  Help needed turning from your back to your side while in a flat bed without using bedrails?: A Lot Help needed moving from lying on your  back to sitting on the side of a flat bed without using bedrails?: A Lot Help needed moving to and from a bed to a chair (including a wheelchair)?: A Lot Help needed standing up from a chair using your arms (e.g., wheelchair or bedside chair)?: A Lot Help needed to walk in hospital room?: A Lot Help needed climbing 3-5 steps with a railing? : Total 6 Click Score: 11    End of Session   Activity Tolerance: Patient tolerated treatment well;Patient limited by fatigue Patient left: in chair;with call bell/phone within reach;with family/visitor present Nurse Communication: Mobility status PT Visit Diagnosis: Unsteadiness on feet (R26.81);Other abnormalities of gait and mobility (R26.89);Muscle weakness (generalized) (M62.81)     Time: 1610-9604 PT Time Calculation (min) (ACUTE ONLY): 29 min  Charges:  $Therapeutic Exercise: 8-22 mins $Therapeutic Activity: 8-22  mins                     12:37 PM, 11/24/22 Ocie Bob, MPT Physical Therapist with [facility] [phone] office [phone] mobile phone

## 2022-11-25 ENCOUNTER — Encounter (HOSPITAL_COMMUNITY): Payer: Self-pay | Admitting: Orthopedic Surgery

## 2022-11-25 DIAGNOSIS — Z170 Estrogen receptor positive status [ER+]: Secondary | ICD-10-CM

## 2022-11-25 DIAGNOSIS — S72001D Fracture of unspecified part of neck of right femur, subsequent encounter for closed fracture with routine healing: Secondary | ICD-10-CM | POA: Diagnosis not present

## 2022-11-25 DIAGNOSIS — S72401D Unspecified fracture of lower end of right femur, subsequent encounter for closed fracture with routine healing: Secondary | ICD-10-CM | POA: Diagnosis not present

## 2022-11-25 DIAGNOSIS — Z96642 Presence of left artificial hip joint: Secondary | ICD-10-CM | POA: Diagnosis not present

## 2022-11-25 DIAGNOSIS — C50411 Malignant neoplasm of upper-outer quadrant of right female breast: Secondary | ICD-10-CM | POA: Diagnosis not present

## 2022-11-25 DIAGNOSIS — Z471 Aftercare following joint replacement surgery: Secondary | ICD-10-CM | POA: Diagnosis not present

## 2022-11-25 DIAGNOSIS — I700 Atherosclerosis of aorta: Secondary | ICD-10-CM | POA: Diagnosis not present

## 2022-11-25 DIAGNOSIS — E611 Iron deficiency: Secondary | ICD-10-CM | POA: Diagnosis not present

## 2022-11-25 DIAGNOSIS — S72409A Unspecified fracture of lower end of unspecified femur, initial encounter for closed fracture: Secondary | ICD-10-CM | POA: Diagnosis not present

## 2022-11-25 DIAGNOSIS — Z9181 History of falling: Secondary | ICD-10-CM | POA: Diagnosis not present

## 2022-11-25 DIAGNOSIS — Z9889 Other specified postprocedural states: Secondary | ICD-10-CM | POA: Diagnosis not present

## 2022-11-25 DIAGNOSIS — S72491G Other fracture of lower end of right femur, subsequent encounter for closed fracture with delayed healing: Secondary | ICD-10-CM | POA: Diagnosis not present

## 2022-11-25 DIAGNOSIS — R488 Other symbolic dysfunctions: Secondary | ICD-10-CM | POA: Diagnosis not present

## 2022-11-25 DIAGNOSIS — R2681 Unsteadiness on feet: Secondary | ICD-10-CM | POA: Diagnosis not present

## 2022-11-25 DIAGNOSIS — I152 Hypertension secondary to endocrine disorders: Secondary | ICD-10-CM | POA: Diagnosis not present

## 2022-11-25 DIAGNOSIS — M810 Age-related osteoporosis without current pathological fracture: Secondary | ICD-10-CM | POA: Diagnosis not present

## 2022-11-25 DIAGNOSIS — E538 Deficiency of other specified B group vitamins: Secondary | ICD-10-CM | POA: Diagnosis not present

## 2022-11-25 DIAGNOSIS — Z13820 Encounter for screening for osteoporosis: Secondary | ICD-10-CM | POA: Diagnosis not present

## 2022-11-25 DIAGNOSIS — F0390 Unspecified dementia without behavioral disturbance: Secondary | ICD-10-CM | POA: Diagnosis not present

## 2022-11-25 DIAGNOSIS — D509 Iron deficiency anemia, unspecified: Secondary | ICD-10-CM | POA: Diagnosis not present

## 2022-11-25 DIAGNOSIS — M6281 Muscle weakness (generalized): Secondary | ICD-10-CM | POA: Diagnosis not present

## 2022-11-25 DIAGNOSIS — E1159 Type 2 diabetes mellitus with other circulatory complications: Secondary | ICD-10-CM | POA: Diagnosis not present

## 2022-11-25 DIAGNOSIS — E118 Type 2 diabetes mellitus with unspecified complications: Secondary | ICD-10-CM | POA: Diagnosis not present

## 2022-11-25 DIAGNOSIS — Z4789 Encounter for other orthopedic aftercare: Secondary | ICD-10-CM | POA: Diagnosis not present

## 2022-11-25 DIAGNOSIS — J8410 Pulmonary fibrosis, unspecified: Secondary | ICD-10-CM | POA: Diagnosis not present

## 2022-11-25 DIAGNOSIS — I10 Essential (primary) hypertension: Secondary | ICD-10-CM | POA: Diagnosis not present

## 2022-11-25 DIAGNOSIS — N390 Urinary tract infection, site not specified: Secondary | ICD-10-CM | POA: Diagnosis not present

## 2022-11-25 DIAGNOSIS — R262 Difficulty in walking, not elsewhere classified: Secondary | ICD-10-CM | POA: Diagnosis not present

## 2022-11-25 DIAGNOSIS — S72001S Fracture of unspecified part of neck of right femur, sequela: Secondary | ICD-10-CM | POA: Diagnosis not present

## 2022-11-25 DIAGNOSIS — Z780 Asymptomatic menopausal state: Secondary | ICD-10-CM | POA: Diagnosis not present

## 2022-11-25 LAB — CBC
HCT: 26.8 % — ABNORMAL LOW (ref 36.0–46.0)
Hemoglobin: 8.6 g/dL — ABNORMAL LOW (ref 12.0–15.0)
MCH: 29 pg (ref 26.0–34.0)
MCHC: 32.1 g/dL (ref 30.0–36.0)
MCV: 90.2 fL (ref 80.0–100.0)
Platelets: 259 10*3/uL (ref 150–400)
RBC: 2.97 MIL/uL — ABNORMAL LOW (ref 3.87–5.11)
RDW: 15.8 % — ABNORMAL HIGH (ref 11.5–15.5)
WBC: 7.3 10*3/uL (ref 4.0–10.5)
nRBC: 0 % (ref 0.0–0.2)

## 2022-11-25 LAB — TYPE AND SCREEN
Antibody Screen: NEGATIVE
Unit division: 0

## 2022-11-25 LAB — SARS CORONAVIRUS 2 BY RT PCR: SARS Coronavirus 2 by RT PCR: NEGATIVE

## 2022-11-25 LAB — BPAM RBC: ISSUE DATE / TIME: 202405071129

## 2022-11-25 MED ORDER — ENOXAPARIN SODIUM 40 MG/0.4ML IJ SOSY: 40.0000 mg | PREFILLED_SYRINGE | INTRAMUSCULAR | 0 refills | Status: DC

## 2022-11-25 MED ORDER — CYANOCOBALAMIN 1000 MCG PO TABS: 1000.0000 ug | ORAL_TABLET | Freq: Every day | ORAL | Status: DC

## 2022-11-25 MED ORDER — ADULT MULTIVITAMIN W/MINERALS CH: 1.0000 | ORAL_TABLET | Freq: Every day | ORAL | Status: DC

## 2022-11-25 MED ORDER — AMOXICILLIN 500 MG PO CAPS: 500.0000 mg | ORAL_CAPSULE | Freq: Three times a day (TID) | ORAL | 0 refills | Status: AC

## 2022-11-25 MED ORDER — FERROUS SULFATE 325 (65 FE) MG PO TABS: 325.0000 mg | ORAL_TABLET | Freq: Every day | ORAL | 3 refills | Status: DC

## 2022-11-25 MED ORDER — ENSURE ENLIVE PO LIQD: 237.0000 mL | Freq: Two times a day (BID) | ORAL | 12 refills | Status: DC

## 2022-11-25 MED ORDER — ACETAMINOPHEN 325 MG PO TABS: 650.0000 mg | ORAL_TABLET | Freq: Four times a day (QID) | ORAL | Status: DC | PRN

## 2022-11-25 NOTE — TOC Transition Note (Signed)
Transition of Care [facility]) - CM/SW Discharge Note   Patient Details  Name: Tamara [name] MRN: [id number] Date of Birth: [birth year]  Transition of Care [facility]) CM/SW Contact:  Leitha Bleak, RN Phone Number: 11/25/2022, 11:18 AM   Clinical Narrative   DC summary sent to [facility], Kerri provided room number. RN will call report after COVID test results. CM called [name] to explain discharge, and answered questions. She will meet with Lynnea Ferrier at lunch to sign papers and decide how many days they can pay co-pay.    Final next level of care: Skilled Nursing Facility Barriers to Discharge: Barriers Resolved   Patient Goals and CMS Choice CMS Medicare.gov Compare Post Acute Care list provided to:: Patient Represenative (must comment) Choice offered to / list presented to : Adult Children  Discharge Placement        Patient to be transferred to facility by: [facility] staff Name of family member notified: [name] Patient and family notified of of transfer: 11/25/22  Discharge Plan and Services Additional resources added to the After Visit Summary for        Social Determinants of Health (SDOH) Interventions SDOH Screenings   Food Insecurity: No Food Insecurity (10/08/2022)  Housing: Low Risk  (10/08/2022)  Transportation Needs: No Transportation Needs (10/08/2022)  Utilities: Not At Risk (10/08/2022)  Depression (PHQ2-9): Low Risk  (10/14/2022)  Tobacco Use: Medium Risk (11/25/2022)   Readmission Risk Interventions    11/23/2022   10:51 AM  Readmission Risk Prevention Plan  Transportation Screening Complete  PCP or Specialist Appt within 5-7 Days Not Complete  Home Care Screening Complete  Medication Review (RN CM) Complete

## 2022-11-25 NOTE — Care Management Important Message (Signed)
Important Message  Patient Details  Name: Tamara [name] MRN: [id number] Date of Birth: Oct 27, 1933   Medicare Important Message Given:  Yes (spoke with daughter Matthias Hughs at [phone] to explain letter, no additional copy needed)     Corey Harold 11/25/2022, 11:11 AM

## 2022-11-25 NOTE — Discharge Summary (Signed)
Physician Discharge Summary  Tamara [name] ZOX:[id number] DOB: 07-13-1934 DOA: 11/21/2022  PCP: [name], MD Orthopedics: Dr. Dallas Schimke  Admit date: 11/21/2022 Discharge date: 11/25/2022  Disposition:  SNF   Recommendations for Outpatient Follow-up:  Follow up with Dr. Dallas Schimke - orthopedics in 2 weeks for postop visit, wound check Follow up with PCP in 2 weeks for recheck, labs, hospital follow up Please obtain BMP/CBC in 2 weeks Please check B12 level in 1 month  Fall precautions recommended  Please consider ambulatory referral to palliative care   Discharge Condition: STABLE   CODE STATUS: FULL DIET: regular   Brief Hospitalization Summary: Please see all hospital notes, images, labs for full details of the hospitalization. ADMISSION PROVIDER HPI:  89 year old female with a history of dementia, diabetes mellitus, hypertension, right breast cancer, and B12 deficiency presenting after an unwitnessed fall with right leg pain.  The patient had a recent hospital admission from 10/08/2022 to 10/14/2022 when she had an unwitnessed fall resulting in right femoral neck fracture.  She underwent a right bipolar hip arthroplasty on 10/09/2022.  She was subsequently discharged to [facility] for STR.  She was subsequently discharged back home from STR on 10/28/2022.  Since discharge home, the patient has been able to ambulate with a walker.  She does require assistance with transfers.  According to her daughter, it appears that the amount of assistance is minimal.  However, it has been recommended that the patient not get up by herself.  Nevertheless, the patient got up early in the morning on 11/21/2022 to go to the bathroom and by herself.  She sustained a fall onto her right side and was not able to get up.  As result, EMS was activated.  There was no syncope or loss of consciousness.  The patient was awake and alert when the patient's daughter found her.  Daughter states that since the patient's last  hospitalization, the patient has had more sundowning episodes and has had more confusion overall. In the ED, the patient was afebrile and hemodynamically stable with oxygen saturation 97% room air.  WBC 11.7, hemoglobin 10.4, platelets 239,000.  Sodium 138, potassium 4.1, bicarbonate 27, creatinine 0.96.  CT of the brain was negative.  CT cervical spine was negative for traumatic injury.  X-ray of the right femur showed an acute comminuted displaced angulated fracture of the distal third of the right femoral shaft, approximately 5 to 6 cm distal to the proximal right femur implant. Orthopedics was consulted.  Dr. Dallas Schimke plans for operative intervention on 11/21/22. Pt was taken to OR on 11/22/22 by Dr. Dallas Schimke who placed a supracondylar nail.  PT was consulted on post op D#1  HOSPITAL COURSE  BY PROBLEM   Right distal femur fracture -Ortho--Dr. Dallas Schimke consulted -11/22/22--supracondylar nail placement -continue lovenox for DVT prophylaxis until ortho follow up -Pt discharging to SNF rehab -PT eval on 11/23/22>>SNF arranged   Dementia without behavioral disturbance -Continue Aricept -pt had hospital delirium during last hospitalization   Essential hypertension - diet controlled -Holding amlodipine and losartan temporarily -BP soft>>improved, remains well controlled off anti-HTN meds   Diabetes mellitus type 2, controlled -Not on any medications as an outpatient -10/08/22 hemoglobin A1c--5.9   Right breast cancer -Currently in remission -Status post lumpectomy and lymph node dissection August 2014 -Patient declined radiation therapy -s/p Aromasin October 2014, continued till October 2019.  -Follow-up Dr. Ellin Saba   UTI--Lactobacillus -5/5 UA >50 WBC -started amoxil x 3 days course   B12 deficiency -B12--167 -pt  received B12 inj, then started po -continue 1000 mcg oral daily    Iron deficiency -given nulecit x 1 on 11/22/22 -iron saturation 9%, iron 23, ferritin 205 -continue po  ferrous sulfate   Acute Anemia -5/7--transfuse one unit PRBC, Hg up to 8.6 after transfusion       Family Communication:  daughter updated at bedside 5/8   Consultants:  ortho--Dr. Dallas Schimke   Code Status:  FULL   DVT Prophylaxis:  enoxaparin until outpatient follow up with orthopedics    Discharge Diagnoses:  Principal Problem:   Closed fracture of distal end of femur, unspecified fracture morphology, initial encounter [facility]) Active Problems:   Malignant neoplasm of upper-outer quadrant of RIGHT female breast (HCC)   B12 deficiency   Essential (primary) hypertension   Hypertension associated with type 2 diabetes mellitus (HCC)   Closed displaced comminuted fracture of shaft of right femur [facility])   Discharge Instructions:  Allergies as of 11/25/2022   No Known Allergies      Medication List     STOP taking these medications    cyanocobalamin 1000 MCG/ML injection Commonly known as: VITAMIN B12 Replaced by: cyanocobalamin 1000 MCG tablet   UNABLE TO FIND       TAKE these medications    acetaminophen 325 MG tablet Commonly known as: TYLENOL Take 2 tablets (650 mg total) by mouth every 6 (six) hours as needed for mild pain, moderate pain, fever or headache. What changed: reasons to take this   amoxicillin 500 MG capsule Commonly known as: AMOXIL Take 1 capsule (500 mg total) by mouth every 8 (eight) hours for 2 days.   calcium-vitamin D 500-200 MG-UNIT tablet Commonly known as: OSCAL WITH D Take 2 tablets by mouth daily with breakfast.   cyanocobalamin 1000 MCG tablet Take 1 tablet (1,000 mcg total) by mouth daily. Start taking on: Nov 26, 2022 Replaces: cyanocobalamin 1000 MCG/ML injection   donepezil 10 MG tablet Commonly known as: ARICEPT Take 1 tablet (10 mg total) by mouth daily.   enoxaparin 40 MG/0.4ML injection Commonly known as: LOVENOX Inject 0.4 mLs (40 mg total) into the skin daily for 20 days.   feeding supplement Liqd Take 237 mLs by  mouth 2 (two) times daily between meals.   ferrous sulfate 325 (65 FE) MG tablet Take 1 tablet (325 mg total) by mouth daily with breakfast. Once a day on Monday and Thursday What changed: when to take this   multivitamin with minerals Tabs tablet Take 1 tablet by mouth daily. Start taking on: Nov 26, 2022        Follow-up Information     [name], MD. Schedule an appointment as soon as possible for a visit in 2 week(s).   Specialty: Internal Medicine Why: Hospital Follow Up Contact information: [address] [place] [address] [phone]         Oliver Barre, MD. Schedule an appointment as soon as possible for a visit in 12 day(s).   Specialties: Orthopedic Surgery, Sports Medicine Why: Hospital Follow Up, For wound re-check Contact information: [address] [place] [address] [phone]                No Known Allergies Allergies as of 11/25/2022   No Known Allergies      Medication List     STOP taking these medications    cyanocobalamin 1000 MCG/ML injection Commonly known as: VITAMIN B12 Replaced by: cyanocobalamin 1000 MCG tablet   UNABLE TO FIND  TAKE these medications    acetaminophen 325 MG tablet Commonly known as: TYLENOL Take 2 tablets (650 mg total) by mouth every 6 (six) hours as needed for mild pain, moderate pain, fever or headache. What changed: reasons to take this   amoxicillin 500 MG capsule Commonly known as: AMOXIL Take 1 capsule (500 mg total) by mouth every 8 (eight) hours for 2 days.   calcium-vitamin D 500-200 MG-UNIT tablet Commonly known as: OSCAL WITH D Take 2 tablets by mouth daily with breakfast.   cyanocobalamin 1000 MCG tablet Take 1 tablet (1,000 mcg total) by mouth daily. Start taking on: Nov 26, 2022 Replaces: cyanocobalamin 1000 MCG/ML injection   donepezil 10 MG tablet Commonly known as: ARICEPT Take 1 tablet (10 mg total) by mouth daily.   enoxaparin 40 MG/0.4ML  injection Commonly known as: LOVENOX Inject 0.4 mLs (40 mg total) into the skin daily for 20 days.   feeding supplement Liqd Take 237 mLs by mouth 2 (two) times daily between meals.   ferrous sulfate 325 (65 FE) MG tablet Take 1 tablet (325 mg total) by mouth daily with breakfast. Once a day on Monday and Thursday What changed: when to take this   multivitamin with minerals Tabs tablet Take 1 tablet by mouth daily. Start taking on: Nov 26, 2022        Procedures/Studies: DG C-Arm 1-60 Min  Result Date: 11/22/2022 CLINICAL DATA:  ORIF right hip fracture EXAM: DG C-ARM 1-60 MIN FLUOROSCOPY: Fluoroscopy Time:  2 minutes 30 seconds Radiation Exposure Index (if provided by the fluoroscopic device): 15.861 mGy Number of Acquired Spot Images: 11 COMPARISON:  11/21/2022 FINDINGS: 11 fluoroscopic images are obtained during the performance of the procedure and are provided for interpretation only. Images demonstrate placement of an intramedullary rod with proximal and distal interlocking screws traversing the comminuted distal femoral fracture seen previously. Alignment appears near anatomic. Please refer to the operative report. IMPRESSION: 1. ORIF distal right femur fracture. Electronically Signed   By: Sharlet Salina M.D.   On: 11/22/2022 15:29   DG FEMUR PORT, MIN 2 VIEWS RIGHT  Result Date: 11/22/2022 CLINICAL DATA:  Postop right femur ORIF. EXAM: RIGHT FEMUR PORTABLE 2 VIEW COMPARISON:  11/21/2022. FINDINGS: Submitted images demonstrate placement of an intramedullary rod on from the inferior margin of the intramedullary stem of the right hip prosthesis to the intercondylar aspect of the distal femur. The primary fracture components of the distal femoral shaft fracture have been significantly reduced. There is residual displacement of the primary distal component 8 mm lateral. Orthopedic hardware appears well seated. There is surrounding soft tissue edema. IMPRESSION: 1. Significant reduction of  the comminuted displaced distal right femoral shaft fracture following ORIF. Orthopedic hardware is well-seated. Electronically Signed   By: Amie Portland M.D.   On: 11/22/2022 13:14   DG Pelvis Portable  Result Date: 11/22/2022 CLINICAL DATA:  Femur fracture EXAM: PORTABLE PELVIS 1-2 VIEWS COMPARISON:  10/09/2022 FINDINGS: Previous bipolar hip replacement on the right. No evidence of acute pelvic region fracture. IMPRESSION: Previous bipolar hip replacement on the right. Electronically Signed   By: Paulina Fusi M.D.   On: 11/22/2022 12:55   DG Chest Portable 1 View  Result Date: 11/21/2022 CLINICAL DATA:  Preop.  Right hip fracture. EXAM: PORTABLE CHEST 1 VIEW COMPARISON:  10/10/2022. FINDINGS: Cardiac silhouette mildly enlarged.  No mediastinal or hilar masses. Bilateral interstitial thickening and areas of scarring, stable from the prior exam. No evidence of pneumonia or pulmonary edema. No convincing  pleural effusion or pneumothorax. Skeletal structures are diffusely demineralized. IMPRESSION: No acute cardiopulmonary disease. Electronically Signed   By: Amie Portland M.D.   On: 11/21/2022 11:42   CT Cervical Spine Wo Contrast  Result Date: 11/21/2022 CLINICAL DATA:  89 year old female status post fall walking to bathroom. Found down. Pain. EXAM: CT CERVICAL SPINE WITHOUT CONTRAST TECHNIQUE: Multidetector CT imaging of the cervical spine was performed without intravenous contrast. Multiplanar CT image reconstructions were also generated. RADIATION DOSE REDUCTION: This exam was performed according to the departmental dose-optimization program which includes automated exposure control, adjustment of the mA and/or kV according to patient size and/or use of iterative reconstruction technique. COMPARISON:  Head CT today.  No prior cervical spine. FINDINGS: Alignment: Maintained cervical lordosis. Subtle degenerative appearing anterolisthesis of C6 on C7. Cervicothoracic junction alignment is within normal  limits. Bilateral posterior element alignment is within normal limits. Skull base and vertebrae: Osteopenia. Visualized skull base is intact. No atlanto-occipital dissociation. C1 and C2 appear intact and aligned. No acute osseous abnormality identified. Soft tissues and spinal canal: No prevertebral fluid or swelling. No visible canal hematoma. Calcified carotid atherosclerosis in the neck. Otherwise negative visible noncontrast neck soft tissues. Disc levels: Mild for age cervical spine degeneration and capacious spinal canal at most levels. Upper chest: Osteopenia. Grossly intact visible upper thoracic levels. Lung apices are clear. IMPRESSION: 1. No acute traumatic injury identified in the cervical spine. 2. Osteopenia. Mild for age cervical spine degeneration. Electronically Signed   By: Odessa Fleming M.D.   On: 11/21/2022 10:43   DG Femur Min 2 Views Right  Result Date: 11/21/2022 CLINICAL DATA:  89 year old female status post fall walking to bathroom. Found down. Pain. Status post right hip fracture and ORIF in March. EXAM: RIGHT FEMUR 2 VIEWS COMPARISON:  Preoperative right hip series 10/08/2022. postoperative pelvis radiographs 10/09/2022. FINDINGS: Four views of the right femur. The right hip arthroplasty, femoral stem appears stable and intact. But there is a highly comminuted femoral shaft fracture in the distal 3rd, beginning about fiber 6 cm distal to the implant. One full shaft width posterior displacement with pronounced posterior angulation. Displaced 8 cm butterfly fragment. Mild medial angulation. Grossly maintained right knee joint alignment with severe joint space loss. IMPRESSION: Acutely comminuted, displaced and angulated fracture of the distal 3rd right femoral shaft, 5-6 cm distal to the proximal right femoral implant. Electronically Signed   By: Odessa Fleming M.D.   On: 11/21/2022 10:40   CT Head Wo Contrast  Result Date: 11/21/2022 CLINICAL DATA:  89 year old female status post fall walking to  bathroom. Found down. Pain. EXAM: CT HEAD WITHOUT CONTRAST TECHNIQUE: Contiguous axial images were obtained from the base of the skull through the vertex without intravenous contrast. RADIATION DOSE REDUCTION: This exam was performed according to the departmental dose-optimization program which includes automated exposure control, adjustment of the mA and/or kV according to patient size and/or use of iterative reconstruction technique. COMPARISON:  Head CT 10/08/2022. FINDINGS: Brain: No midline shift, mass effect, or evidence of intracranial mass lesion. No ventriculomegaly. Chronic anterior temporal lobe atrophy more pronounced on the left (series 2, image 11). Stable cerebral volume. Stable gray-white matter differentiation throughout the brain. Patchy up to moderate bilateral periventricular white matter hypodensity. No acute intracranial hemorrhage identified. No cortically based acute infarct identified. Vascular: Calcified atherosclerosis at the skull base. No suspicious intracranial vascular hyperdensity. Skull: Stable. Osteopenia. No acute osseous abnormality identified. Sinuses/Orbits: Visualized paranasal sinuses and mastoids are stable and well aerated. Other: No  orbit or scalp soft tissue injury identified. IMPRESSION: 1. No acute intracranial abnormality or acute traumatic injury identified. 2. Stable non contrast CT appearance of temporal lobe atrophy, white matter disease. Cerebral Atrophy (ICD10-G31.9). Electronically Signed   By: Odessa Fleming M.D.   On: 11/21/2022 10:38     Subjective: No specific complaints, agreeable to SNF rehab, discussed with daughter;   Discharge Exam: Vitals:   11/24/22 1958 11/25/22 0330  BP: (!) 114/57 (!) 109/58  Pulse: 85 88  Resp: 16 20  Temp: 98.9 F (37.2 C) 99.3 F (37.4 C)  SpO2: 99% 97%   Vitals:   11/24/22 1300 11/24/22 1438 11/24/22 1958 11/25/22 0330  BP: (!) 101/53 (!) 97/56 (!) 114/57 (!) 109/58  Pulse: 71 78 85 88  Resp: 17 16 16 20   Temp:  97.8 F (36.6 C) 98.4 F (36.9 C) 98.9 F (37.2 C) 99.3 F (37.4 C)  TempSrc: Oral Oral Oral Oral  SpO2: 97% 99% 99% 97%  Weight:      Height:       General: Pt is alert, awake, not in acute distress Cardiovascular: normal S1/S2 +, no rubs, no gallops Respiratory: CTA bilaterally, no wheezing, no rhonchi Abdominal: Soft, NT, ND, bowel sounds + Extremities: wounds clean, dry, intact, palpable distal pulses bilateral   The results of significant diagnostics from this hospitalization (including imaging, microbiology, ancillary and laboratory) are listed below for reference.     Microbiology: Recent Results (from the past 240 hour(s))  MRSA Next Gen by PCR, Nasal     Status: None   Collection Time: 11/22/22  4:33 AM   Specimen: Nasal Mucosa; Nasal Swab  Result Value Ref Range Status   MRSA by PCR Next Gen NOT DETECTED NOT DETECTED Final    Comment: (NOTE) The GeneXpert MRSA Assay (FDA approved for NASAL specimens only), is one component of a comprehensive MRSA colonization surveillance program. It is not intended to diagnose MRSA infection nor to guide or monitor treatment for MRSA infections. Test performance is not FDA approved in patients less than 59 years old. Performed at [facility], [address], [place], [place] [address]   Urine Culture     Status: Abnormal   Collection Time: 11/22/22 12:00 PM   Specimen: Urine, Catheterized  Result Value Ref Range Status   Specimen Description   Final    URINE, CATHETERIZED Performed at [facility], [address] [place], [place] [address]    Special Requests   Final    NONE Performed at [facility], [address] [place], [place] [address]    Culture (A)  Final    >=100,000 COLONIES/mL LACTOBACILLUS SPECIES Standardized susceptibility testing for this organism is not available. >=100,000 COLONIES/mL YEAST    Report Status 11/23/2022 FINAL  Final     Labs: BNP (last 3 results) Recent Labs    10/11/22 1430  BNP  237.0*   Basic Metabolic Panel: Recent Labs  Lab 11/21/22 1111 11/22/22 0407 11/23/22 0733 11/24/22 0356  NA 138 137 134* 135  K 4.1 3.6 3.7 3.6  CL 104 103 103 103  CO2 27 26 24 24   GLUCOSE 143* 115* 131* 97  BUN 19 21 18 22   CREATININE 0.96 0.95 0.94 1.02*  CALCIUM 8.3* 8.0* 7.2* 7.3*  MG  --   --  2.1 2.2   Liver Function Tests: No results for input(s): "AST", "ALT", "ALKPHOS", "BILITOT", "PROT", "ALBUMIN" in the last 168 hours. No results for input(s): "LIPASE", "AMYLASE" in the last  168 hours. No results for input(s): "AMMONIA" in the last 168 hours. CBC: Recent Labs  Lab 11/21/22 1111 11/22/22 0407 11/23/22 0733 11/24/22 0356 11/25/22 0759  WBC 11.7* 6.6 9.5 8.5 7.3  NEUTROABS 9.7*  --   --   --   --   HGB 10.4* 9.3* 7.9* 7.2* 8.6*  HCT 32.5* 28.9* 24.5* 22.2* 26.8*  MCV 94.2 93.2 93.5 94.1 90.2  PLT 339 296 243 231 259   Cardiac Enzymes: No results for input(s): "CKTOTAL", "CKMB", "CKMBINDEX", "TROPONINI" in the last 168 hours. BNP: Invalid input(s): "POCBNP" CBG: No results for input(s): "GLUCAP" in the last 168 hours. D-Dimer No results for input(s): "DDIMER" in the last 72 hours. Hgb A1c No results for input(s): "HGBA1C" in the last 72 hours. Lipid Profile No results for input(s): "CHOL", "HDL", "LDLCALC", "TRIG", "CHOLHDL", "LDLDIRECT" in the last 72 hours. Thyroid function studies No results for input(s): "TSH", "T4TOTAL", "T3FREE", "THYROIDAB" in the last 72 hours.  Invalid input(s): "FREET3" Anemia work up No results for input(s): "VITAMINB12", "FOLATE", "FERRITIN", "TIBC", "IRON", "RETICCTPCT" in the last 72 hours. Urinalysis    Component Value Date/Time   COLORURINE YELLOW 11/22/2022 1200   APPEARANCEUR TURBID (A) 11/22/2022 1200   LABSPEC 1.015 11/22/2022 1200   PHURINE 5.0 11/22/2022 1200   GLUCOSEU NEGATIVE 11/22/2022 1200   HGBUR SMALL (A) 11/22/2022 1200   BILIRUBINUR NEGATIVE 11/22/2022 1200   KETONESUR NEGATIVE 11/22/2022 1200    PROTEINUR 100 (A) 11/22/2022 1200   NITRITE NEGATIVE 11/22/2022 1200   LEUKOCYTESUR SMALL (A) 11/22/2022 1200   Sepsis Labs Recent Labs  Lab 11/22/22 0407 11/23/22 0733 11/24/22 0356 11/25/22 0759  WBC 6.6 9.5 8.5 7.3   Microbiology Recent Results (from the past 240 hour(s))  MRSA Next Gen by PCR, Nasal     Status: None   Collection Time: 11/22/22  4:33 AM   Specimen: Nasal Mucosa; Nasal Swab  Result Value Ref Range Status   MRSA by PCR Next Gen NOT DETECTED NOT DETECTED Final    Comment: (NOTE) The GeneXpert MRSA Assay (FDA approved for NASAL specimens only), is one component of a comprehensive MRSA colonization surveillance program. It is not intended to diagnose MRSA infection nor to guide or monitor treatment for MRSA infections. Test performance is not FDA approved in patients less than 3 years old. Performed at [facility], [address], [place], [place] [address]   Urine Culture     Status: Abnormal   Collection Time: 11/22/22 12:00 PM   Specimen: Urine, Catheterized  Result Value Ref Range Status   Specimen Description   Final    URINE, CATHETERIZED Performed at [facility], [address], [place], [place] [address]    Special Requests   Final    NONE Performed at [facility], [address], [place], [place] [address]    Culture (A)  Final    >=100,000 COLONIES/mL LACTOBACILLUS SPECIES Standardized susceptibility testing for this organism is not available. >=100,000 COLONIES/mL YEAST    Report Status 11/23/2022 FINAL  Final    Time coordinating discharge: 35 mins   SIGNED:  Standley Dakins, MD  [facility] 11/25/2022, 10:09 AM How to contact the [facility] Attending or Consulting provider 7A - 7P or covering provider during after hours 7P -7A, for this patient?  Check the care team in [facility] and look for a) attending/consulting [facility] provider listed and b) the [facility] team listed Log into [email or web address] and use [facility]'s universal password to access.  If you do not have  the password, please contact the hospital operator. Locate the [facility] provider you are looking for under [facility] and page to a number that you can be directly reached. If you still have difficulty reaching the provider, please page the [facility] (Director on Call) for the Hospitalists listed on amion for assistance.

## 2022-11-25 NOTE — Progress Notes (Signed)
Patient night uneventful, rested quietly throughout the night. Bladder scan patient at 6am due to no urine all night scan showed 400 ml, physician made aware and received order to in/out, in/out produced 500 ml. Post cath scan showed 65 ml.

## 2022-11-25 NOTE — Discharge Instructions (Signed)
IMPORTANT INFORMATION: PAY CLOSE ATTENTION   PHYSICIAN DISCHARGE INSTRUCTIONS  Follow with Primary care provider  Fagan, Roy, MD  and other consultants as instructed by your Hospitalist Physician  SEEK MEDICAL CARE OR RETURN TO EMERGENCY ROOM IF SYMPTOMS COME BACK, WORSEN OR NEW PROBLEM DEVELOPS   Please note: You were cared for by a hospitalist during your hospital stay. Every effort will be made to forward records to your primary care provider.  You can request that your primary care provider send for your hospital records if they have not received them.  Once you are discharged, your primary care physician will handle any further medical issues. Please note that NO REFILLS for any discharge medications will be authorized once you are discharged, as it is imperative that you return to your primary care physician (or establish a relationship with a primary care physician if you do not have one) for your post hospital discharge needs so that they can reassess your need for medications and monitor your lab values.  Please get a complete blood count and chemistry panel checked by your Primary MD at your next visit, and again as instructed by your Primary MD.  Get Medicines reviewed and adjusted: Please take all your medications with you for your next visit with your Primary MD  Laboratory/radiological data: Please request your Primary MD to go over all hospital tests and procedure/radiological results at the follow up, please ask your primary care provider to get all Hospital records sent to his/her office.  In some cases, they will be blood work, cultures and biopsy results pending at the time of your discharge. Please request that your primary care provider follow up on these results.  If you are diabetic, please bring your blood sugar readings with you to your follow up appointment with primary care.    Please call and make your follow up appointments as soon as possible.    Also Note the  following: If you experience worsening of your admission symptoms, develop shortness of breath, life threatening emergency, suicidal or homicidal thoughts you must seek medical attention immediately by calling 911 or calling your MD immediately  if symptoms less severe.  You must read complete instructions/literature along with all the possible adverse reactions/side effects for all the Medicines you take and that have been prescribed to you. Take any new Medicines after you have completely understood and accpet all the possible adverse reactions/side effects.   Do not drive when taking Pain medications or sleeping medications (Benzodiazepines)  Do not take more than prescribed Pain, Sleep and Anxiety Medications. It is not advisable to combine anxiety,sleep and pain medications without talking with your primary care practitioner  Special Instructions: If you have smoked or chewed Tobacco  in the last 2 yrs please stop smoking, stop any regular Alcohol  and or any Recreational drug use.  Wear Seat belts while driving.  Do not drive if taking any narcotic, mind altering or controlled substances or recreational drugs or alcohol.       

## 2022-11-25 NOTE — Consult Note (Signed)
[facility] Customer service manager [facility]) Accountable Care Organization (ACO) [facility] Liaison Note  11/25/2022  Tamara [name] 03-06-1934 [id number]  Location: [facility] RN Hospital Liaison screened the patient remotely at [facility].  Insurance: [employer]   Tamara [name] is a 89 y.o. female who is a Primary Care Patient of [name], MD. The patient was screened for readmission hospitalization with noted medium risk score for unplanned readmission risk with 2 IP in 6 months.  The patient was assessed for potential [facility]) Care Management service needs for post hospital transition for care coordination. Review of patient's electronic medical record reveals patient d/c disposition to go to SNF [facility]) then home with [facility]. Facility will cover pt's post hospital needs at that time.   [facility] Care Management/Population Health does not replace or interfere with any arrangements made by the Inpatient Transition of Care team.   For questions contact:   Elliot Cousin, RN, BSN [facility] Liaison [place]   [facility]  Population Health Office Hours MTWF 8:00 am to 6 pm off on Thursday [phone] mobile [phone] [Office toll free line][facility] Office Hours are M-F 8:30 - 5 pm 24 hour nurse advise line [phone] Conceirge  [email or web address]

## 2022-11-25 NOTE — Plan of Care (Signed)

## 2022-11-26 ENCOUNTER — Encounter: Payer: Self-pay | Admitting: Adult Health

## 2022-11-26 ENCOUNTER — Non-Acute Institutional Stay (SKILLED_NURSING_FACILITY): Payer: PPO | Admitting: Adult Health

## 2022-11-26 DIAGNOSIS — S72001S Fracture of unspecified part of neck of right femur, sequela: Secondary | ICD-10-CM | POA: Diagnosis not present

## 2022-11-26 DIAGNOSIS — I152 Hypertension secondary to endocrine disorders: Secondary | ICD-10-CM | POA: Diagnosis not present

## 2022-11-26 DIAGNOSIS — S72491G Other fracture of lower end of right femur, subsequent encounter for closed fracture with delayed healing: Secondary | ICD-10-CM | POA: Diagnosis not present

## 2022-11-26 DIAGNOSIS — C50411 Malignant neoplasm of upper-outer quadrant of right female breast: Secondary | ICD-10-CM | POA: Diagnosis not present

## 2022-11-26 DIAGNOSIS — I700 Atherosclerosis of aorta: Secondary | ICD-10-CM

## 2022-11-26 DIAGNOSIS — F0390 Unspecified dementia without behavioral disturbance: Secondary | ICD-10-CM

## 2022-11-26 DIAGNOSIS — E1159 Type 2 diabetes mellitus with other circulatory complications: Secondary | ICD-10-CM | POA: Diagnosis not present

## 2022-11-26 DIAGNOSIS — J8410 Pulmonary fibrosis, unspecified: Secondary | ICD-10-CM | POA: Diagnosis not present

## 2022-11-26 DIAGNOSIS — Z170 Estrogen receptor positive status [ER+]: Secondary | ICD-10-CM

## 2022-11-26 DIAGNOSIS — E611 Iron deficiency: Secondary | ICD-10-CM

## 2022-11-26 NOTE — Progress Notes (Signed)
Location:  [facility] Room Number: 131 P Place of Service:  SNF (31)   CODE STATUS: Full Code   No Known Allergies  Chief Complaint  Patient presents with   Hospitalization Follow-up    Follow-up form recent hospital stay 5/4-11/25/2022    HPI:  She is a 89 year old woman who has been hospitalized from 11-21-22 through 11-25-22. Her medical history includes; dementia; diabetes; hypertension; vitamin B12 deficiency. She had an unwitnessed fall with right leg pain. She had been hospitalized from 10-08-22 through 10-14-22 also for an unwitnessed fall with right femoral fracture. He had underwent a bipolar hip arthroplasty on 10-09-22. She was then transferred for [facility] for short term rehab on 10-28-22. Since she was discharged she has been able to ambulate with a walker. Her fall on 5-4 resulted as she was taking herself to the bathroom. She fell onto her right side and was unable to get up. On 11-22-22 she underwent a supracondylar nail placement. She is here for short term rehab with her goal to return back home.  She will continue to be followed for her chronic illnesses including:  Vitamin B 12 deficiency:  Iron deficiency anemia; unspecified iron deficiency dementia without behavioral disturbance: Fibrosis of lung Hypertension associated with type 2 diabetes mellitus:Aortic atherosclerosis     Past Medical History:  Diagnosis Date   B12 deficiency 06/08/2016   Breast cancer (HCC)    Breast cancer, right breast (HCC) 04/25/2013   Cancer (HCC)    Claustrophobia    Claustrophobia    Dementia (HCC)    Hypertension    Iron deficiency anemia 12/15/2016   Malignant neoplasm of upper-outer quadrant of RIGHT female breast (HCC) 04/25/2013   Stage IA (T1b, N0, M0) invasive mucinous breast carcinoma, S/P right breast lumpectomy by Dr. Lovell Sheehan on 03/08/2013 with 1 negative sentinel node. Cancer was identified as ER+ 100%, PR+ 85%, Her2 negative, and Ki-67 marker at 17%. Oncotype Dx  score of 20 with 13% 10 year risk of distant recurrence and absolute benefit of chemotherapy at 10 years in this risk category is negligible and less than 1%.   Osteoporosis     Past Surgical History:  Procedure Laterality Date   ABDOMINAL HYSTERECTOMY     CATARACT EXTRACTION, BILATERAL     FEMUR IM NAIL Right 11/22/2022   Procedure: INTRAMEDULLARY (IM) RETROGRADE FEMORAL NAILING;  Surgeon: Oliver Barre, MD;  Location: [facility];  Service: Orthopedics;  Laterality: Right;   HIP ARTHROPLASTY Right 10/09/2022   Procedure: ARTHROPLASTY BIPOLAR HIP (HEMIARTHROPLASTY);  Surgeon: Vickki Hearing, MD;  Location: [facility];  Service: Orthopedics;  Laterality: Right;   PARTIAL MASTECTOMY WITH NEEDLE LOCALIZATION AND AXILLARY SENTINEL LYMPH NODE BX Right 03/08/2013   Procedure: PARTIAL MASTECTOMY WITH NEEDLE LOCALIZATION AND AXILLARY SENTINEL LYMPH NODE BX;  Surgeon: Dalia Heading, MD;  Location: [facility];  Service: General;  Laterality: Right;  Sentinel Node Bx @ 7:30am Needle Loc @ 8:00am    Social History   Socioeconomic History   Marital status: Widowed    Spouse name: Not on file   Number of children: Not on file   Years of education: Not on file   Highest education level: Not on file  Occupational History   Not on file  Tobacco Use   Smoking status: Former    Packs/day: 0.50    Years: 40.00    Additional pack years: 0.00    Total pack years: 20.00    Types: Cigarettes  Quit date: 03/03/1967    Years since quitting: 55.7   Smokeless tobacco: Never  Vaping Use   Vaping Use: Never used  Substance and Sexual Activity   Alcohol use: No   Drug use: No   Sexual activity: Yes    Birth control/protection: Surgical  Other Topics Concern   Not on file  Social History Narrative   Not on file   Social Determinants of Health   Financial Resource Strain: Not on file  Food Insecurity: No Food Insecurity (10/08/2022)   Hunger Vital Sign    Worried About Running Out of Food in the Last  Year: Never true    Ran Out of Food in the Last Year: Never true  Transportation Needs: No Transportation Needs (10/08/2022)   PRAPARE - Administrator, Civil Service (Medical): No    Lack of Transportation (Non-Medical): No  Physical Activity: Not on file  Stress: Not on file  Social Connections: Not on file  Intimate Partner Violence: Not At Risk (10/08/2022)   Humiliation, Afraid, Rape, and Kick questionnaire    Fear of Current or Ex-Partner: No    Emotionally Abused: No    Physically Abused: No    Sexually Abused: No   Family History  Problem Relation Age of Onset   Diabetes Mother    Cancer Father    Cancer Sister    Cancer Brother       VITAL SIGNS BP 114/62   Pulse 82   Temp (!) 97.5 F (36.4 C)   Resp 20   Ht 5\' 2"  (1.575 m)   Wt 138 lb 9.6 oz (62.9 kg)   SpO2 96%   BMI 25.35 kg/m   Outpatient Encounter Medications as of 11/26/2022  Medication Sig   acetaminophen (TYLENOL) 325 MG tablet Take 2 tablets (650 mg total) by mouth every 6 (six) hours as needed for mild pain, moderate pain, fever or headache.   amoxicillin (AMOXIL) 500 MG capsule Take 1 capsule (500 mg total) by mouth every 8 (eight) hours for 2 days.   calcium-vitamin D (OSCAL WITH D) 500-200 MG-UNIT per tablet Take 2 tablets by mouth daily with breakfast.   donepezil (ARICEPT) 10 MG tablet Take 1 tablet (10 mg total) by mouth daily.   enoxaparin (LOVENOX) 40 MG/0.4ML injection Inject 0.4 mLs (40 mg total) into the skin daily for 20 days.   feeding supplement (ENSURE ENLIVE / ENSURE PLUS) LIQD Take 237 mLs by mouth 2 (two) times daily between meals.   ferrous sulfate 325 (65 FE) MG tablet Take 1 tablet (325 mg total) by mouth daily with breakfast. Once a day on Monday and Thursday   UNABLE TO FIND Diet - Liquids: Regular Thin   vitamin B-12 1000 MCG tablet Take 1 tablet (1,000 mcg total) by mouth daily.   Multiple Vitamin (MULTIVITAMIN WITH MINERALS) TABS tablet Take 1 tablet by mouth daily.    No facility-administered encounter medications on file as of 11/26/2022.     SIGNIFICANT DIAGNOSTIC EXAMS  TODAY  10-08-22: right hip x-ray: Right femoral neck fracture.  10-11-22: ct of chest:  1. Widespread subpleural lung opacity, with lower lung volumes since 2017, trace pleural effusions, and scattered nonspecific peribronchial opacity. Atelectatic changes to the hilar airways. No overt lung consolidation. The appearance is suspicious for Chronic Lung Disease with Developing Fibrosis.Superimposed viral/atypical Respiratory Infection is difficult to exclude. And there are reactive appearing right paratracheal lymph nodes. 2. Chronic mild cardiomegaly is stable since 2017. Calcified coronary artery  and Aortic Atherosclerosis  3. Chronic cholelithiasis. Right nephrolithiasis versus vascular calcifications.   11-21-22: ct of head:  1. No acute intracranial abnormality or acute traumatic injury identified. 2. Stable non contrast CT appearance of temporal lobe atrophy, white matter disease. Cerebral Atrophy  11-21-22: right hip x-ray:  Acutely comminuted, displaced and angulated fracture of the distal 3rd right femoral shaft, 5-6 cm distal to the proximal right femoral implant.   TODAY  10-20-22: wbc 6.7;hgb 8.8; hct 27.6; mcv 97.9 plt 318 10-22-22; iron 25; tibc 231 11-21-22: wbc 11.7; hgb 10.4; hct 32.5; mcv 94.2 plt 339; glucose 143; bun 19; creat 0.96; k+ 4.1; na++ 138; ca 8.3; gfr 58 11-22-22: wbc vitamin B 12: 167; folate 14.9; iron 23; tibce 251; ferritin 205; urine culture: lactobacillus species and yeast 11-25-22: wbc 7.3; hgb 8.6; hct 26.8; mcv 90.2 plt 259   Review of Systems  Constitutional:  Negative for malaise/fatigue.  Respiratory:  Negative for cough and shortness of breath.   Cardiovascular:  Negative for chest pain, palpitations and leg swelling.  Gastrointestinal:  Negative for abdominal pain, constipation and heartburn.  Musculoskeletal:  Negative for back pain, joint pain  and myalgias.  Skin: Negative.   Neurological:  Negative for dizziness.  Psychiatric/Behavioral:  The patient is not nervous/anxious.     Physical Exam Constitutional:      General: She is not in acute distress.    Appearance: She is well-developed. She is not diaphoretic.  Neck:     Thyroid: No thyromegaly.  Cardiovascular:     Rate and Rhythm: Normal rate and regular rhythm.     Pulses: Normal pulses.     Heart sounds: Normal heart sounds.  Pulmonary:     Effort: Pulmonary effort is normal. No respiratory distress.     Breath sounds: Normal breath sounds.  Abdominal:     General: Bowel sounds are normal. There is no distension.     Palpations: Abdomen is soft.     Tenderness: There is no abdominal tenderness.  Musculoskeletal:        General: Normal range of motion.     Cervical back: Neck supple.     Right lower leg: No edema.     Left lower leg: No edema.     Comments: Status post right hip fracture: ace intact   Lymphadenopathy:     Cervical: No cervical adenopathy.  Skin:    General: Skin is warm and dry.  Neurological:     Mental Status: She is alert. Mental status is at baseline.  Psychiatric:        Mood and Affect: Mood normal.      ASSESSMENT/ PLAN:  TODAY  Closed comminuted intra-articular fracture of distal femur right with delayed healing subsequent encounter: will continue therapy as directed to improve upon her level of independent with her adls. Will follow up with orthopedics. Will continue lovenox 40 mg daily for total of 20 days  2. UTI lactobacillus species: will complete amoxicillin  3. Vitamin B 12 deficiency: level is 167; will continue 1000 mcg daily and will repeat levels  4. Iron deficiency anemia; unspecified iron deficiency hgb 10.4 will continue iron twice weekly; on 11-22-22 received nulecit while in the hospital    5. Dementia without behavioral disturbance: weight is 138 pounds will continue aricept 10 mg daily  6. Fibrosis of lung  remains on room air  7. Hypertension associated with type 2 diabetes mellitus: b/p 114/62; is off amlodipine and losartan  8. Aortic atherosclerosis (  ct 10-11-22)   9. Type 2 diabetes mellitus without complication without current long term use of insulin: hgb A1c 5.9; will monitor   10. History of breast cancer: history lumpectomy and lymph node dissection August 2014; no radiation (she declined) s/p aromasin October 2014.      Synthia Innocent NP [facility] Adult Medicine  call [phone]

## 2022-11-27 ENCOUNTER — Non-Acute Institutional Stay (SKILLED_NURSING_FACILITY): Payer: PPO | Admitting: Internal Medicine

## 2022-11-27 ENCOUNTER — Encounter: Payer: Self-pay | Admitting: Internal Medicine

## 2022-11-27 DIAGNOSIS — E538 Deficiency of other specified B group vitamins: Secondary | ICD-10-CM

## 2022-11-27 DIAGNOSIS — I10 Essential (primary) hypertension: Secondary | ICD-10-CM | POA: Diagnosis not present

## 2022-11-27 DIAGNOSIS — S72491G Other fracture of lower end of right femur, subsequent encounter for closed fracture with delayed healing: Secondary | ICD-10-CM

## 2022-11-27 DIAGNOSIS — F0390 Unspecified dementia without behavioral disturbance: Secondary | ICD-10-CM | POA: Diagnosis not present

## 2022-11-27 DIAGNOSIS — S72409A Unspecified fracture of lower end of unspecified femur, initial encounter for closed fracture: Secondary | ICD-10-CM

## 2022-11-27 DIAGNOSIS — E611 Iron deficiency: Secondary | ICD-10-CM

## 2022-11-27 NOTE — Assessment & Plan Note (Signed)
PT and OT at SNF as dementia allows compliance.

## 2022-11-27 NOTE — Assessment & Plan Note (Signed)
B12 level 167.  She received parenteral injection followed by oral supplementation. Repeat B12 level in 6-8 weeks to verify absorption.

## 2022-11-27 NOTE — Assessment & Plan Note (Signed)
Nadir H/H 7.2/22.2.  Iron level 23, iron saturation 9% and ferritin 205.  New set administered 5/5 and oral ferrous sulfate continued.

## 2022-11-27 NOTE — Patient Instructions (Signed)
See assessment and plan under each diagnosis in the problem list and acutely for this visit 

## 2022-11-27 NOTE — Assessment & Plan Note (Signed)
She can provide no meaningful history.  She is unaware of having been hospitalized for the fracture.  She denies any pain. She lacks competency and it is expected that she will continue to ambulate without help with associated high risk for additional fractures.

## 2022-11-27 NOTE — Progress Notes (Signed)
NURSING HOME LOCATION:  [facility] ROOM NUMBER:  131 P  CODE STATUS:  Full Code  PCP:  [name] MD  This is a comprehensive admission note to this SNFperformed on this date less than 30 days from date of admission. Included are preadmission medical/surgical history; reconciled medication list; family history; social history and comprehensive review of systems.  Corrections and additions to the records were documented. Comprehensive physical exam was also performed. Additionally a clinical summary was entered for each active diagnosis pertinent to this admission in the Problem List to enhance continuity of care.  HPI: She was hospitalized 5/4 - 11/25/2022 presenting to the ED after an unwitnessed fall with right lower extremity pain. This was in the context of her recent hospitalization 3/21 - 10/14/2022.  She had also had an unwitnessed fall resulting in right femoral neck fracture.  At that time she underwent a right bipolar hip arthroplasty on 3/22.  She completed rehab at [facility] before returning home on 4/10. After returning home she has been able to ambulate with a walker but required assistance with transfers. Apparently the patient got up by herself early on the morning of 5/4 to go to the bathroom resulting in the mechanical fall.  She was unable to get up; EMS was activated for transfer to the ED. These recurrent falls are in the context of a family report that patient has had more frequent sundowning episodes and increasing confusion overall. In the ED imaging revealed acute comminuted displaced angulated fracture of the distal third of the right femoral shaft approximately 5-6 cm distal to the proximal right femoral implant.  Dr. Dallas Schimke consulted and placed a supracondylar nail on 5/5.  Postop DVT prophylaxis was with Lovenox. Amlodipine and losartan were held as blood pressure was soft.  It remained adequately controlled without antihypertensive medications.  Preop  H/H was 10.4/32.5; nadir H/H was 7.2/22.2.  H/H prior to discharge after transfusion of 1 unit packed red cells was 8.6/26.8.  B12 level was 167 and oral supplementation was initiated after injection.  Iron deficiency is also was noted as iron saturation was 9% and iron level 23 and ferritin 205.  He was set was administered on 5/5 and oral ferrous sulfate continued..  Course was also complicated by UTI with culture revealing greater than 100,000 lactobacillus species.  She did receive a course of antibiotics.  While hospitalized glucoses ranged from 97 up to 143.  GFR was 58; prior GFR had been over 60.  Discharge for rehab was recommended by PT/OT.   Past medical and surgical history: Includes essential hypertension, history of breast cancer, vascular dementia, and diabetes with vascular complications. Pertinent surgical history is included in the HPI.  Social history: Non-smoker; nondrinker.  Family history: Noncontributory due to advanced age.   Review of systems: Clinical neurocognitive deficits made validity of responses questionable ,preventing ROS completion.  She can provide no meaningful history.  She stated that she was "all right" and denies any pain.  She cannot tell me what had transpired prior to the SNF admission.  She denied having been in the hospital.  She could not explain why the right lower extremity was wrapped.  When I asked about this the second time she confabulated about "going home to get those things" as she pointed at the nightgown on the bed  Physical exam:  Pertinent or positive findings: She appears her stated age.  She has bilateral ptosis.  Facies are blank.  She has complete  dentures.  Slight gallop cadence was present.  There is slight accentuation of S1 and S2.  She has minor rales at the right lower lobe posteriorly.  She has trace edema at the sock line.  Pedal pulses are decreased.  Right lower extremity is surgically wrapped.  General appearance: Adequately  nourished; no acute distress, increased work of breathing is present.   Lymphatic: No lymphadenopathy about the head, neck, axilla. Eyes: No conjunctival inflammation or lid edema is present. There is no scleral icterus. Ears:  External ear exam shows no significant lesions or deformities.   Nose:  External nasal examination shows no deformity or inflammation. Nasal mucosa are pink and moist without lesions, exudates Oral exam: Lips and gums are healthy appearing.There is no oropharyngeal erythema or exudate. Neck:  No thyromegaly, masses, tenderness noted.    Heart:  Normal rate and regular rhythm. S1 and S2 normal without gallop, murmur, click, rub.  Lungs: Chest clear to auscultation without wheezes, rhonchi, rales, rubs. Abdomen: Bowel sounds are normal.  Abdomen is soft and nontender with no organomegaly, hernias, masses. GU: Deferred  Extremities:  No cyanosis, clubbing, edema. Neurologic exam:  Strength equal  in upper & lower extremities. Balance, Rhomberg, finger to nose testing could not be completed due to clinical state Deep tendon reflexes are equal Skin: Warm & dry w/o tenting. No significant lesions or rash.  See clinical summary under each active problem in the Problem List with associated updated therapeutic plan

## 2022-11-27 NOTE — Assessment & Plan Note (Signed)
Blood pressure controlled without antihypertensive medications.  Continue to monitor at SNF.

## 2022-12-01 ENCOUNTER — Ambulatory Visit (HOSPITAL_COMMUNITY)
Admission: RE | Admit: 2022-12-01 | Discharge: 2022-12-01 | Disposition: A | Discharge status: Home / Self Care | Payer: PPO | Source: Ambulatory Visit | Attending: Internal Medicine | Admitting: Internal Medicine

## 2022-12-01 DIAGNOSIS — Z13820 Encounter for screening for osteoporosis: Secondary | ICD-10-CM

## 2022-12-01 DIAGNOSIS — M810 Age-related osteoporosis without current pathological fracture: Secondary | ICD-10-CM | POA: Diagnosis not present

## 2022-12-01 DIAGNOSIS — Z780 Asymptomatic menopausal state: Secondary | ICD-10-CM | POA: Diagnosis not present

## 2022-12-03 ENCOUNTER — Other Ambulatory Visit (HOSPITAL_COMMUNITY)
Admission: RE | Admit: 2022-12-03 | Discharge: 2022-12-03 | Disposition: A | Discharge status: Home / Self Care | Payer: PPO | Source: Skilled Nursing Facility | Attending: Adult Health | Admitting: Adult Health

## 2022-12-03 DIAGNOSIS — E1159 Type 2 diabetes mellitus with other circulatory complications: Secondary | ICD-10-CM | POA: Insufficient documentation

## 2022-12-03 LAB — HEMOGLOBIN AND HEMATOCRIT, BLOOD
HCT: 29.3 % — ABNORMAL LOW (ref 36.0–46.0)
Hemoglobin: 9.2 g/dL — ABNORMAL LOW (ref 12.0–15.0)

## 2022-12-03 LAB — COMPREHENSIVE METABOLIC PANEL
ALT: 26 U/L (ref 0–44)
AST: 39 U/L (ref 15–41)
Albumin: 2.8 g/dL — ABNORMAL LOW (ref 3.5–5.0)
Alkaline Phosphatase: 105 U/L (ref 38–126)
Anion gap: 8 (ref 5–15)
BUN: 25 mg/dL — ABNORMAL HIGH (ref 8–23)
CO2: 24 mmol/L (ref 22–32)
Calcium: 8.1 mg/dL — ABNORMAL LOW (ref 8.9–10.3)
Chloride: 106 mmol/L (ref 98–111)
Creatinine, Ser: 0.94 mg/dL (ref 0.44–1.00)
GFR, Estimated: 59 mL/min — ABNORMAL LOW (ref >60)
Glucose, Bld: 87 mg/dL (ref 70–99)
Potassium: 4.1 mmol/L (ref 3.5–5.1)
Sodium: 138 mmol/L (ref 135–145)
Total Bilirubin: 0.7 mg/dL (ref 0.3–1.2)
Total Protein: 6 g/dL — ABNORMAL LOW (ref 6.5–8.1)

## 2022-12-03 LAB — VITAMIN B12: Vitamin B-12: 377 pg/mL (ref 180–914)

## 2022-12-08 ENCOUNTER — Other Ambulatory Visit: Payer: Self-pay

## 2022-12-08 ENCOUNTER — Encounter: Payer: Self-pay | Admitting: Orthopedic Surgery

## 2022-12-08 ENCOUNTER — Other Ambulatory Visit (INDEPENDENT_AMBULATORY_CARE_PROVIDER_SITE_OTHER): Payer: PPO

## 2022-12-08 ENCOUNTER — Ambulatory Visit (INDEPENDENT_AMBULATORY_CARE_PROVIDER_SITE_OTHER): Payer: PPO | Admitting: Orthopedic Surgery

## 2022-12-08 DIAGNOSIS — S72351D Displaced comminuted fracture of shaft of right femur, subsequent encounter for closed fracture with routine healing: Secondary | ICD-10-CM

## 2022-12-08 DIAGNOSIS — Z9889 Other specified postprocedural states: Secondary | ICD-10-CM | POA: Diagnosis not present

## 2022-12-08 NOTE — Progress Notes (Signed)
Orthopaedic Postop Note  Assessment: Tamara [name] is a 89 y.o. female s/p retrograde femoral nail for comminuted Right distal 1/3   femoral shaft fracture  DOS: 11/22/22  Plan: Sutures trimmed, steri strips placed Continue with protective WBAT Continue with DVT prophylaxis for at least 6 weeks after surgery WBAT on the operative extremity Follow up in 4 weeks; call with any issues   Follow-up: No follow-ups on file. XR at next visit: Right femur  Subjective:  Chief Complaint  Patient presents with   Routine Post Op    R hip/femur DOS 11/22/22    History of Present Illness: Tamara [name] is a 89 y.o. female who presents following the above stated procedure.  Surgery was approximately 2 weeks ago.  Procedure was complicated by recent fall, requiring right hip hemiarthroplasty.  She was discharged to [facility].  She has been doing well.  She does have advanced dementia.  She is able to ambulate with the assistance of a walker.  She continues to have pain.  Review of Systems: No fevers or chills No numbness or tingling No Chest Pain No shortness of breath   Objective: There were no vitals taken for this visit.  Physical Exam:  Alert and oriented.  No acute distress.  Seated in wheelchair.  Surgical incisions are healing well.  No surrounding erythema or drainage.  Knee range of motion from 20-85 degrees.  She is sitting comfortably in a wheelchair, with her right knee bent.  Active motion intact in the TA/EHL.  Toes are warm and well-perfused.   IMAGING: I personally ordered and reviewed the following images:  XR of the Right femur demonstrates a well positioned retrograde femoral nail.   The comminuted distal femoral shaft fracture remains in stable position.  There is no evidence of implant subsidence.  No acute fractures are noted.  Retrograde nail is abutting the hemiarthroplasty.  Impression: Right femoral shaft fracture in stable position without evidence hardware  failure  Oliver Barre, MD 12/08/2022 11:35 AM

## 2022-12-10 ENCOUNTER — Encounter: Payer: Self-pay | Admitting: Adult Health

## 2022-12-10 ENCOUNTER — Non-Acute Institutional Stay (SKILLED_NURSING_FACILITY): Payer: PPO | Admitting: Adult Health

## 2022-12-10 DIAGNOSIS — I152 Hypertension secondary to endocrine disorders: Secondary | ICD-10-CM

## 2022-12-10 DIAGNOSIS — I700 Atherosclerosis of aorta: Secondary | ICD-10-CM

## 2022-12-10 DIAGNOSIS — E1159 Type 2 diabetes mellitus with other circulatory complications: Secondary | ICD-10-CM | POA: Diagnosis not present

## 2022-12-10 DIAGNOSIS — J8410 Pulmonary fibrosis, unspecified: Secondary | ICD-10-CM | POA: Diagnosis not present

## 2022-12-10 DIAGNOSIS — S72491G Other fracture of lower end of right femur, subsequent encounter for closed fracture with delayed healing: Secondary | ICD-10-CM

## 2022-12-10 NOTE — Progress Notes (Signed)
Location:  [facility] Room Number: 131 Place of Service:  SNF (31)   CODE STATUS: FULL CODE  No Known Allergies  Chief Complaint  Patient presents with   Discharge Note    Discharge    HPI:  She is being discharged to home with home health for pt/ot. She will not need any dme. She will need her prescriptions written and will need to follow up with her medical provider. She had been hospitalized after a right hip fracture. She was admitted to this facility for short term rehab. Therapy: stand/pivot: mod assist; bed mobility: min assist; ambulate 80 feet with contact guard; upper body min assist; lower body mod to max assist; brp: mod assist. She is impulsive. She will be returning home.   Past Medical History:  Diagnosis Date   B12 deficiency 06/08/2016   Breast cancer (HCC)    Breast cancer, right breast (HCC) 04/25/2013   Cancer (HCC)    Claustrophobia    Claustrophobia    Dementia (HCC)    Hypertension    Iron deficiency anemia 12/15/2016   Malignant neoplasm of upper-outer quadrant of RIGHT female breast (HCC) 04/25/2013   Stage IA (T1b, N0, M0) invasive mucinous breast carcinoma, S/P right breast lumpectomy by Dr. Lovell Sheehan on 03/08/2013 with 1 negative sentinel node. Cancer was identified as ER+ 100%, PR+ 85%, Her2 negative, and Ki-67 marker at 17%. Oncotype Dx score of 20 with 13% 10 year risk of distant recurrence and absolute benefit of chemotherapy at 10 years in this risk category is negligible and less than 1%.   Osteoporosis     Past Surgical History:  Procedure Laterality Date   ABDOMINAL HYSTERECTOMY     CATARACT EXTRACTION, BILATERAL     FEMUR IM NAIL Right 11/22/2022   Procedure: INTRAMEDULLARY (IM) RETROGRADE FEMORAL NAILING;  Surgeon: Oliver Barre, MD;  Location: [facility];  Service: Orthopedics;  Laterality: Right;   HIP ARTHROPLASTY Right 10/09/2022   Procedure: ARTHROPLASTY BIPOLAR HIP (HEMIARTHROPLASTY);  Surgeon: Vickki Hearing, MD;   Location: [facility];  Service: Orthopedics;  Laterality: Right;   PARTIAL MASTECTOMY WITH NEEDLE LOCALIZATION AND AXILLARY SENTINEL LYMPH NODE BX Right 03/08/2013   Procedure: PARTIAL MASTECTOMY WITH NEEDLE LOCALIZATION AND AXILLARY SENTINEL LYMPH NODE BX;  Surgeon: Dalia Heading, MD;  Location: [facility];  Service: General;  Laterality: Right;  Sentinel Node Bx @ 7:30am Needle Loc @ 8:00am    Social History   Socioeconomic History   Marital status: Widowed    Spouse name: Not on file   Number of children: Not on file   Years of education: Not on file   Highest education level: Not on file  Occupational History   Not on file  Tobacco Use   Smoking status: Former    Packs/day: 0.50    Years: 40.00    Additional pack years: 0.00    Total pack years: 20.00    Types: Cigarettes    Quit date: 03/03/1967    Years since quitting: 55.8   Smokeless tobacco: Never  Vaping Use   Vaping Use: Never used  Substance and Sexual Activity   Alcohol use: No   Drug use: No   Sexual activity: Yes    Birth control/protection: Surgical  Other Topics Concern   Not on file  Social History Narrative   Not on file   Social Determinants of Health   Financial Resource Strain: Not on file  Food Insecurity: No Food Insecurity (10/08/2022)  Hunger Vital Sign    Worried About Running Out of Food in the Last Year: Never true    Ran Out of Food in the Last Year: Never true  Transportation Needs: No Transportation Needs (10/08/2022)   PRAPARE - Administrator, Civil Service (Medical): No    Lack of Transportation (Non-Medical): No  Physical Activity: Not on file  Stress: Not on file  Social Connections: Not on file  Intimate Partner Violence: Not At Risk (10/08/2022)   Humiliation, Afraid, Rape, and Kick questionnaire    Fear of Current or Ex-Partner: No    Emotionally Abused: No    Physically Abused: No    Sexually Abused: No   Family History  Problem Relation Age of Onset   Diabetes  Mother    Cancer Father    Cancer Sister    Cancer Brother       VITAL SIGNS BP (!) 101/59   Pulse 78   Temp (!) 97.1 F (36.2 C)   Resp 16   Ht 5\' 2"  (1.575 m)   Wt 135 lb 8 oz (61.5 kg)   SpO2 99%   BMI 24.78 kg/m   Outpatient Encounter Medications as of 12/10/2022  Medication Sig   acetaminophen (TYLENOL) 325 MG tablet Take 2 tablets (650 mg total) by mouth every 6 (six) hours as needed for mild pain, moderate pain, fever or headache.   calcium-vitamin D (OSCAL WITH D) 500-200 MG-UNIT per tablet Take 2 tablets by mouth daily with breakfast.   donepezil (ARICEPT) 10 MG tablet Take 1 tablet (10 mg total) by mouth daily.   enoxaparin (LOVENOX) 40 MG/0.4ML injection Inject 0.4 mLs (40 mg total) into the skin daily for 20 days.   feeding supplement (ENSURE ENLIVE / ENSURE PLUS) LIQD Take 237 mLs by mouth 2 (two) times daily between meals.   ferrous sulfate 325 (65 FE) MG tablet Take 1 tablet (325 mg total) by mouth daily with breakfast. Once a day on Monday and Thursday   Multiple Vitamin (MULTIVITAMIN WITH MINERALS) TABS tablet Take 1 tablet by mouth daily.   UNABLE TO FIND Diet - Liquids: Regular Thin   vitamin B-12 1000 MCG tablet Take 1 tablet (1,000 mcg total) by mouth daily.   No facility-administered encounter medications on file as of 12/10/2022.     SIGNIFICANT DIAGNOSTIC EXAMS   TODAY  10-08-22: right hip x-ray: Right femoral neck fracture.  10-11-22: ct of chest:  1. Widespread subpleural lung opacity, with lower lung volumes since 2017, trace pleural effusions, and scattered nonspecific peribronchial opacity. Atelectatic changes to the hilar airways. No overt lung consolidation. The appearance is suspicious for Chronic Lung Disease with Developing Fibrosis.Superimposed viral/atypical Respiratory Infection is difficult to exclude. And there are reactive appearing right paratracheal lymph nodes. 2. Chronic mild cardiomegaly is stable since 2017. Calcified coronary  artery and Aortic Atherosclerosis  3. Chronic cholelithiasis. Right nephrolithiasis versus vascular calcifications.   11-21-22: ct of head:  1. No acute intracranial abnormality or acute traumatic injury identified. 2. Stable non contrast CT appearance of temporal lobe atrophy, white matter disease. Cerebral Atrophy  11-21-22: right hip x-ray:  Acutely comminuted, displaced and angulated fracture of the distal 3rd right femoral shaft, 5-6 cm distal to the proximal right femoral implant.   TODAY  10-20-22: wbc 6.7;hgb 8.8; hct 27.6; mcv 97.9 plt 318 10-22-22; iron 25; tibc 231 11-21-22: wbc 11.7; hgb 10.4; hct 32.5; mcv 94.2 plt 339; glucose 143; bun 19; creat 0.96; k+ 4.1; na++  138; ca 8.3; gfr 58 11-22-22: wbc vitamin B 12: 167; folate 14.9; iron 23; tibce 251; ferritin 205; urine culture: lactobacillus species and yeast 11-25-22: wbc 7.3; hgb 8.6; hct 26.8; mcv 90.2 plt 259   Review of Systems  Constitutional:  Negative for malaise/fatigue.  Respiratory:  Negative for cough and shortness of breath.   Cardiovascular:  Negative for chest pain, palpitations and leg swelling.  Gastrointestinal:  Negative for abdominal pain, constipation and heartburn.  Musculoskeletal:  Negative for back pain, joint pain and myalgias.  Skin: Negative.   Neurological:  Negative for dizziness.  Psychiatric/Behavioral:  The patient is not nervous/anxious.    Physical Exam Constitutional:      General: She is not in acute distress.    Appearance: She is well-developed. She is not diaphoretic.  Neck:     Thyroid: No thyromegaly.  Cardiovascular:     Rate and Rhythm: Normal rate and regular rhythm.     Pulses: Normal pulses.     Heart sounds: Normal heart sounds.  Pulmonary:     Effort: Pulmonary effort is normal. No respiratory distress.     Breath sounds: Normal breath sounds.  Abdominal:     General: Bowel sounds are normal. There is no distension.     Palpations: Abdomen is soft.     Tenderness: There is  no abdominal tenderness.  Musculoskeletal:        General: Normal range of motion.     Cervical back: Neck supple.     Right lower leg: No edema.     Left lower leg: No edema.     Comments:  Status post right hip fracture  Lymphadenopathy:     Cervical: No cervical adenopathy.  Skin:    General: Skin is warm and dry.  Neurological:     Mental Status: She is alert. Mental status is at baseline.  Psychiatric:        Mood and Affect: Mood normal.       ASSESSMENT/ PLAN:   Patient is being discharged with the following home health services:  pt/ot: to evaluate and treat as indicated for gait balance strength adl training  Patient is being discharged with the following durable medical equipment:  no dme needed   Patient has been advised to f/u with their PCP in 1-2 weeks to for a transitions of care visit.  Social services at their facility was responsible for arranging this appointment.  Pt was provided with adequate prescriptions of noncontrolled medications to reach the scheduled appointment .  For controlled substances, a limited supply was provided as appropriate for the individual patient.  If the pt normally receives these medications from a pain clinic or has a contract with another physician, these medications should be received from that clinic or physician only).    A 30 day supply of her prescription medications have been sent to: [facility] in [place]   Time spent with patient: 40 minutes: medications; home health; dme   Synthia Innocent NP [facility]  call [phone]

## 2022-12-11 ENCOUNTER — Other Ambulatory Visit: Payer: Self-pay | Admitting: Adult Health

## 2022-12-11 MED ORDER — DONEPEZIL HCL 10 MG PO TABS: 10.0000 mg | ORAL_TABLET | Freq: Every day | ORAL | 0 refills | Status: DC

## 2022-12-11 MED ORDER — ENOXAPARIN SODIUM 40 MG/0.4ML IJ SOSY: 40.0000 mg | PREFILLED_SYRINGE | INTRAMUSCULAR | 0 refills | Status: DC

## 2022-12-29 ENCOUNTER — Telehealth: Payer: Self-pay | Admitting: Radiology

## 2022-12-29 DIAGNOSIS — J8410 Pulmonary fibrosis, unspecified: Secondary | ICD-10-CM | POA: Diagnosis not present

## 2022-12-29 DIAGNOSIS — S7291XA Unspecified fracture of right femur, initial encounter for closed fracture: Secondary | ICD-10-CM | POA: Diagnosis not present

## 2022-12-29 DIAGNOSIS — M810 Age-related osteoporosis without current pathological fracture: Secondary | ICD-10-CM | POA: Diagnosis not present

## 2022-12-29 NOTE — Telephone Encounter (Signed)
Please call Dr Ouida Sills about this patient.  6/12 Wed AM at [phone] Or if after 12 noon on Wed call Dr Alonza Smoker cell at [phone]

## 2023-01-01 ENCOUNTER — Ambulatory Visit (INDEPENDENT_AMBULATORY_CARE_PROVIDER_SITE_OTHER): Payer: PPO | Admitting: Orthopedic Surgery

## 2023-01-01 ENCOUNTER — Encounter: Payer: Self-pay | Admitting: Orthopedic Surgery

## 2023-01-01 VITALS — BP 114/81 | HR 79

## 2023-01-01 DIAGNOSIS — S72001D Fracture of unspecified part of neck of right femur, subsequent encounter for closed fracture with routine healing: Secondary | ICD-10-CM

## 2023-01-01 NOTE — Progress Notes (Signed)
Chief Complaint  Patient presents with   Routine Post Op    Fu post op right knee trouble with stairs    Post-op Follow-up    Right hip fracture 10/09/22 Right femur 11/22/22 (Dr Dallas Schimke)   Encounter Diagnosis  Name Primary?   Closed fracture of right hip with routine healing, subsequent encounter bipolar hip replacement 10/09/22 Yes   89 year old female had a right hip fracture treated with bipolar and then had a right distal femur fracture treated with a retrograde nail  Her right hip flexion is normal she not have any difficulty from that  She is having some stiffness in the right knee with swelling of the right leg expected  Right hip fracture status post bipolar continue follow-ups with Dr. Dallas Schimke for x-rays to assess healing of the femur fracture

## 2023-01-02 DIAGNOSIS — R262 Difficulty in walking, not elsewhere classified: Secondary | ICD-10-CM | POA: Diagnosis not present

## 2023-01-02 DIAGNOSIS — M6281 Muscle weakness (generalized): Secondary | ICD-10-CM | POA: Diagnosis not present

## 2023-01-02 DIAGNOSIS — S72001D Fracture of unspecified part of neck of right femur, subsequent encounter for closed fracture with routine healing: Secondary | ICD-10-CM | POA: Diagnosis not present

## 2023-01-02 DIAGNOSIS — J8410 Pulmonary fibrosis, unspecified: Secondary | ICD-10-CM | POA: Diagnosis not present

## 2023-01-05 ENCOUNTER — Encounter: Payer: PPO | Admitting: Orthopedic Surgery

## 2023-01-05 DIAGNOSIS — S7291XA Unspecified fracture of right femur, initial encounter for closed fracture: Secondary | ICD-10-CM | POA: Diagnosis not present

## 2023-01-05 DIAGNOSIS — M810 Age-related osteoporosis without current pathological fracture: Secondary | ICD-10-CM | POA: Diagnosis not present

## 2023-01-05 DIAGNOSIS — J8410 Pulmonary fibrosis, unspecified: Secondary | ICD-10-CM | POA: Diagnosis not present

## 2023-01-12 ENCOUNTER — Encounter: Payer: PPO | Admitting: Orthopedic Surgery

## 2023-01-27 ENCOUNTER — Other Ambulatory Visit: Payer: Self-pay

## 2023-01-27 ENCOUNTER — Encounter: Payer: Self-pay | Admitting: Orthopedic Surgery

## 2023-01-27 ENCOUNTER — Other Ambulatory Visit (INDEPENDENT_AMBULATORY_CARE_PROVIDER_SITE_OTHER): Payer: PPO

## 2023-01-27 ENCOUNTER — Ambulatory Visit (INDEPENDENT_AMBULATORY_CARE_PROVIDER_SITE_OTHER): Payer: PPO | Admitting: Orthopedic Surgery

## 2023-01-27 DIAGNOSIS — S72001D Fracture of unspecified part of neck of right femur, subsequent encounter for closed fracture with routine healing: Secondary | ICD-10-CM

## 2023-01-27 DIAGNOSIS — S72351D Displaced comminuted fracture of shaft of right femur, subsequent encounter for closed fracture with routine healing: Secondary | ICD-10-CM

## 2023-01-27 MED ORDER — TRAMADOL HCL 50 MG PO TABS: 50.0000 mg | ORAL_TABLET | Freq: Two times a day (BID) | ORAL | 0 refills | Status: DC | PRN

## 2023-01-27 NOTE — Progress Notes (Signed)
Orthopaedic Postop Note  Assessment: Tamara [name] is a 89 y.o. female s/p retrograde right femoral nail distal to a right hip hemiarthroplasty  DOS: 11/22/2022  Plan: Tamara [name] is doing well, considering that she had to think surgery is within a couple months.  She is understandably reluctant to bear weight, but when she does she is doing well.  She continues to have some pain, primarily in the right knee.  This is restricting her right knee range of motion.  Incisions of healed well.  Radiographs are stable, with evidence of consolidation.  Pleased with her progress so far.  Encouraged her to continue to walk, carefully in order to strengthen the bone.  Meds ordered this encounter  Medications   traMADol (ULTRAM) 50 MG tablet    Sig: Take 1 tablet (50 mg total) by mouth every 12 (twelve) hours as needed.    Dispense:  20 tablet    Refill:  0     Follow-up: Return in about 6 weeks (around 03/10/2023). XR at next visit: Right femur  Subjective:  Chief Complaint  Patient presents with   Routine Post Op    R  DOS 11/22/22    History of Present Illness: Tamara [name] is a 89 y.o. female who presents following the above stated procedure.  Surgery was approximately 2 months ago.  She is doing well.  She is staying with family.  She will ambulate, with assistance, using a walker.  Occasional pain in the right knee.  Family notes swelling in the lower leg, but this has not been associated with warmth or redness.  Review of Systems: No fevers or chills No numbness or tingling No Chest Pain No shortness of breath   Objective: There were no vitals taken for this visit.  Physical Exam:  Elderly female.  Seated in wheelchair.  Surgical incision of the right leg are healing.  No surrounding erythema or drainage.  Some residual swelling in the right knee.  Range of motion from 15-90 degrees.  No tenderness palpation in the right thigh.  Mild diffuse swelling of the lower  leg.  IMAGING: I personally ordered and reviewed the following images:  X-rays of the right femur were obtained in clinic today.  No acute injuries are noted.  Comminuted distal right femoral shaft fracture remains in stable alignment.  No evidence of hardware failure.  There has been interval consolidation.  Well-positioned right hip hemiarthroplasty remains in unchanged position.  Compared to prior x-rays, there is been no interval displacement.  Advanced degenerative changes of the right knee.  No bony lesions.  Impression: Stable right femoral shaft fracture, without hardware failure   Oliver Barre, MD 01/27/2023 9:36 AM

## 2023-02-01 DIAGNOSIS — R262 Difficulty in walking, not elsewhere classified: Secondary | ICD-10-CM | POA: Diagnosis not present

## 2023-02-01 DIAGNOSIS — M6281 Muscle weakness (generalized): Secondary | ICD-10-CM | POA: Diagnosis not present

## 2023-02-01 DIAGNOSIS — J8410 Pulmonary fibrosis, unspecified: Secondary | ICD-10-CM | POA: Diagnosis not present

## 2023-02-01 DIAGNOSIS — S72001D Fracture of unspecified part of neck of right femur, subsequent encounter for closed fracture with routine healing: Secondary | ICD-10-CM | POA: Diagnosis not present

## 2023-02-12 DIAGNOSIS — N3000 Acute cystitis without hematuria: Secondary | ICD-10-CM | POA: Diagnosis not present

## 2023-03-04 DIAGNOSIS — J8410 Pulmonary fibrosis, unspecified: Secondary | ICD-10-CM | POA: Diagnosis not present

## 2023-03-04 DIAGNOSIS — M6281 Muscle weakness (generalized): Secondary | ICD-10-CM | POA: Diagnosis not present

## 2023-03-04 DIAGNOSIS — S72001D Fracture of unspecified part of neck of right femur, subsequent encounter for closed fracture with routine healing: Secondary | ICD-10-CM | POA: Diagnosis not present

## 2023-03-04 DIAGNOSIS — R262 Difficulty in walking, not elsewhere classified: Secondary | ICD-10-CM | POA: Diagnosis not present

## 2023-03-10 ENCOUNTER — Encounter: Payer: Self-pay | Admitting: Orthopedic Surgery

## 2023-03-10 ENCOUNTER — Ambulatory Visit (INDEPENDENT_AMBULATORY_CARE_PROVIDER_SITE_OTHER): Payer: PPO | Admitting: Orthopedic Surgery

## 2023-03-10 ENCOUNTER — Other Ambulatory Visit (INDEPENDENT_AMBULATORY_CARE_PROVIDER_SITE_OTHER): Payer: PPO

## 2023-03-10 DIAGNOSIS — S72351D Displaced comminuted fracture of shaft of right femur, subsequent encounter for closed fracture with routine healing: Secondary | ICD-10-CM

## 2023-03-10 MED ORDER — HYDROXYZINE HCL 10 MG PO TABS: 10.0000 mg | ORAL_TABLET | Freq: Three times a day (TID) | ORAL | 0 refills | Status: DC | PRN

## 2023-03-10 NOTE — Progress Notes (Signed)
Orthopaedic Postop Note  Assessment: Tamara [name] is a 89 y.o. female s/p retrograde right femoral nail distal to a right hip hemiarthroplasty  DOS: 11/22/2022  Plan: Tamara [name] is doing pretty well overall.  She has no pain on physical exam.  Radiographs demonstrates callus formation.  She is able to flex the knee to 90 degrees, but limited motion beyond that.  She is 15-20 degrees, full extension.  Certainly, she could improve her range of motion overall.  I have encouraged her to continue ambulating, on a daily basis, and wanted to improve her strength, range of motion as well as to assist with bone healing.  Unfortunately, given her mental state, it is difficult for her to appreciate the importance.  Family is discussing the possibility of sending her back to a rehab facility for further care.  I can assist with this, they will contact the clinic.  I would like to see her back in 3 months.   Follow-up: Return in about 3 months (around 06/10/2023). XR at next visit: Right femur  Subjective:  Chief Complaint  Patient presents with   Routine Post Op    R hip/femur DOS 11/22/22    History of Present Illness: Tamara [name] is a 89 y.o. female who presents following the above stated procedure.  Surgery was approximately 3 months ago.  Overall, she is doing pretty good.  She has no pain.  Unfortunately, she has not been very active.  She requires assistance, and really only gets up when she uses the restroom.  Family has not been able to commit the time to help with her rehab.  They are discussing the possibility of sending her back to a rehab facility.   Review of Systems: No fevers or chills No numbness or tingling No Chest Pain No shortness of breath   Objective: There were no vitals taken for this visit.  Physical Exam:  Elderly female.  Seated in wheelchair.  She does not answer questions appropriately.  Surgical incisions of healed.  No surrounding erythema or drainage.  Mild  diffuse swelling from her knee distal to her ankle.  This is improving.  Range of motion from 15-90 degrees.  No pain with axial loading.   IMAGING: I personally ordered and reviewed the following images:  X-rays of the right femur were obtained in clinic today.  These are compared to prior x-rays.  There are no acute injuries.  Comminuted fracture of the distal femoral shaft remains in stable position.  Screws are not backing out.  Hardware remains stable.  There is callus formation about the fracture site.  Limited views of the right hip demonstrate that the prosthesis remains in a good stable position.  Impression: Healing right comminuted femoral shaft fracture without hardware failure or subsidence  Oliver Barre, MD 03/10/2023 9:49 AM

## 2023-03-23 ENCOUNTER — Encounter (HOSPITAL_COMMUNITY): Payer: Self-pay | Admitting: *Deleted

## 2023-03-23 ENCOUNTER — Emergency Department (HOSPITAL_COMMUNITY): Payer: PPO

## 2023-03-23 ENCOUNTER — Observation Stay (HOSPITAL_COMMUNITY)
Admission: EM | Admit: 2023-03-23 | Discharge: 2023-03-26 | Disposition: A | Discharge status: Skilled Nursing Facility | Payer: PPO | Attending: Family Medicine | Admitting: Family Medicine

## 2023-03-23 ENCOUNTER — Other Ambulatory Visit: Payer: Self-pay

## 2023-03-23 DIAGNOSIS — J189 Pneumonia, unspecified organism: Secondary | ICD-10-CM | POA: Diagnosis not present

## 2023-03-23 DIAGNOSIS — I152 Hypertension secondary to endocrine disorders: Secondary | ICD-10-CM | POA: Diagnosis present

## 2023-03-23 DIAGNOSIS — I771 Stricture of artery: Secondary | ICD-10-CM | POA: Diagnosis not present

## 2023-03-23 DIAGNOSIS — I10 Essential (primary) hypertension: Secondary | ICD-10-CM | POA: Insufficient documentation

## 2023-03-23 DIAGNOSIS — E119 Type 2 diabetes mellitus without complications: Secondary | ICD-10-CM | POA: Diagnosis not present

## 2023-03-23 DIAGNOSIS — R9431 Abnormal electrocardiogram [ECG] [EKG]: Secondary | ICD-10-CM | POA: Diagnosis not present

## 2023-03-23 DIAGNOSIS — R0902 Hypoxemia: Secondary | ICD-10-CM | POA: Diagnosis not present

## 2023-03-23 DIAGNOSIS — R059 Cough, unspecified: Secondary | ICD-10-CM | POA: Diagnosis present

## 2023-03-23 DIAGNOSIS — Z96641 Presence of right artificial hip joint: Secondary | ICD-10-CM | POA: Diagnosis not present

## 2023-03-23 DIAGNOSIS — R0602 Shortness of breath: Secondary | ICD-10-CM | POA: Diagnosis not present

## 2023-03-23 DIAGNOSIS — Z1152 Encounter for screening for COVID-19: Secondary | ICD-10-CM | POA: Diagnosis not present

## 2023-03-23 DIAGNOSIS — Z87891 Personal history of nicotine dependence: Secondary | ICD-10-CM | POA: Insufficient documentation

## 2023-03-23 DIAGNOSIS — E1159 Type 2 diabetes mellitus with other circulatory complications: Secondary | ICD-10-CM | POA: Diagnosis present

## 2023-03-23 DIAGNOSIS — F0390 Unspecified dementia without behavioral disturbance: Secondary | ICD-10-CM | POA: Diagnosis present

## 2023-03-23 DIAGNOSIS — Z79899 Other long term (current) drug therapy: Secondary | ICD-10-CM | POA: Diagnosis not present

## 2023-03-23 DIAGNOSIS — J9601 Acute respiratory failure with hypoxia: Secondary | ICD-10-CM | POA: Diagnosis not present

## 2023-03-23 DIAGNOSIS — Z853 Personal history of malignant neoplasm of breast: Secondary | ICD-10-CM | POA: Diagnosis not present

## 2023-03-23 DIAGNOSIS — R918 Other nonspecific abnormal finding of lung field: Secondary | ICD-10-CM | POA: Diagnosis not present

## 2023-03-23 LAB — CBC WITH DIFFERENTIAL/PLATELET
Abs Immature Granulocytes: 0.02 10*3/uL (ref 0.00–0.07)
Basophils Absolute: 0 10*3/uL (ref 0.0–0.1)
Basophils Relative: 0 %
Eosinophils Absolute: 0.3 10*3/uL (ref 0.0–0.5)
Eosinophils Relative: 4 %
HCT: 36.3 % (ref 36.0–46.0)
Hemoglobin: 11.6 g/dL — ABNORMAL LOW (ref 12.0–15.0)
Immature Granulocytes: 0 %
Lymphocytes Relative: 18 %
Lymphs Abs: 1.3 10*3/uL (ref 0.7–4.0)
MCH: 30.1 pg (ref 26.0–34.0)
MCHC: 32 g/dL (ref 30.0–36.0)
MCV: 94 fL (ref 80.0–100.0)
Monocytes Absolute: 1.1 10*3/uL — ABNORMAL HIGH (ref 0.1–1.0)
Monocytes Relative: 14 %
Neutro Abs: 4.9 10*3/uL (ref 1.7–7.7)
Neutrophils Relative %: 64 %
Platelets: 212 10*3/uL (ref 150–400)
RBC: 3.86 MIL/uL — ABNORMAL LOW (ref 3.87–5.11)
RDW: 15.7 % — ABNORMAL HIGH (ref 11.5–15.5)
WBC: 7.6 10*3/uL (ref 4.0–10.5)
nRBC: 0 % (ref 0.0–0.2)

## 2023-03-23 LAB — BASIC METABOLIC PANEL
Anion gap: 5 (ref 5–15)
BUN: 24 mg/dL — ABNORMAL HIGH (ref 8–23)
CO2: 28 mmol/L (ref 22–32)
Calcium: 8.3 mg/dL — ABNORMAL LOW (ref 8.9–10.3)
Chloride: 107 mmol/L (ref 98–111)
Creatinine, Ser: 0.91 mg/dL (ref 0.44–1.00)
GFR, Estimated: >60 mL/min (ref >60)
Glucose, Bld: 97 mg/dL (ref 70–99)
Potassium: 4.2 mmol/L (ref 3.5–5.1)
Sodium: 140 mmol/L (ref 135–145)

## 2023-03-23 LAB — RESP PANEL BY RT-PCR (RSV, FLU A&B, COVID)  RVPGX2
Influenza A by PCR: NEGATIVE
Influenza B by PCR: NEGATIVE
Resp Syncytial Virus by PCR: NEGATIVE
SARS Coronavirus 2 by RT PCR: NEGATIVE

## 2023-03-23 LAB — GLUCOSE, CAPILLARY: Glucose-Capillary: 122 mg/dL — ABNORMAL HIGH (ref 70–99)

## 2023-03-23 MED ORDER — GUAIFENESIN-DM 100-10 MG/5ML PO SYRP
15.0000 mL | ORAL_SOLUTION | Freq: Three times a day (TID) | ORAL | Status: AC
  Administered 2023-03-23 – 2023-03-24 (×3): 15 mL via ORAL
  Filled 2023-03-23 (×3): qty 15

## 2023-03-23 MED ORDER — SODIUM CHLORIDE 0.9 % IV SOLN
1.0000 g | Freq: Once | INTRAVENOUS | Status: AC
  Administered 2023-03-23: 1 g via INTRAVENOUS
  Filled 2023-03-23: qty 10

## 2023-03-23 MED ORDER — SODIUM CHLORIDE 0.9 % IV SOLN
500.0000 mg | INTRAVENOUS | Status: DC
  Administered 2023-03-24 – 2023-03-25 (×2): 500 mg via INTRAVENOUS
  Filled 2023-03-23 (×2): qty 5

## 2023-03-23 MED ORDER — ACETAMINOPHEN 325 MG PO TABS
650.0000 mg | ORAL_TABLET | Freq: Four times a day (QID) | ORAL | Status: DC | PRN
  Administered 2023-03-23: 650 mg via ORAL
  Filled 2023-03-23: qty 2

## 2023-03-23 MED ORDER — ENOXAPARIN SODIUM 40 MG/0.4ML IJ SOSY
40.0000 mg | PREFILLED_SYRINGE | INTRAMUSCULAR | Status: DC
  Administered 2023-03-23 – 2023-03-25 (×3): 40 mg via SUBCUTANEOUS
  Filled 2023-03-23 (×3): qty 0.4

## 2023-03-23 MED ORDER — ONDANSETRON HCL 4 MG/2ML IJ SOLN: 4.0000 mg | Freq: Four times a day (QID) | INTRAMUSCULAR | Status: DC | PRN

## 2023-03-23 MED ORDER — DOXYLAMINE SUCCINATE (SLEEP) 25 MG PO TABS: 25.0000 mg | ORAL_TABLET | Freq: Every evening | ORAL | Status: DC | PRN

## 2023-03-23 MED ORDER — AZITHROMYCIN 250 MG PO TABS
500.0000 mg | ORAL_TABLET | Freq: Once | ORAL | Status: AC
  Administered 2023-03-23: 500 mg via ORAL
  Filled 2023-03-23: qty 2

## 2023-03-23 MED ORDER — SODIUM CHLORIDE 0.9 % IV SOLN
2.0000 g | INTRAVENOUS | Status: DC
  Administered 2023-03-24 – 2023-03-25 (×2): 2 g via INTRAVENOUS
  Filled 2023-03-23 (×2): qty 20

## 2023-03-23 MED ORDER — LACTATED RINGERS IV SOLN: INTRAVENOUS | Status: DC

## 2023-03-23 MED ORDER — ACETAMINOPHEN 650 MG RE SUPP: 650.0000 mg | Freq: Four times a day (QID) | RECTAL | Status: DC | PRN

## 2023-03-23 MED ORDER — DONEPEZIL HCL 5 MG PO TABS
10.0000 mg | ORAL_TABLET | Freq: Every day | ORAL | Status: DC
  Administered 2023-03-23 – 2023-03-25 (×3): 10 mg via ORAL
  Filled 2023-03-23 (×3): qty 2

## 2023-03-23 MED ORDER — POLYETHYLENE GLYCOL 3350 17 G PO PACK: 17.0000 g | PACK | Freq: Every day | ORAL | Status: DC | PRN

## 2023-03-23 MED ORDER — ONDANSETRON HCL 4 MG PO TABS: 4.0000 mg | ORAL_TABLET | Freq: Four times a day (QID) | ORAL | Status: DC | PRN

## 2023-03-23 NOTE — ED Triage Notes (Signed)
Pt with dementia, family member states pt with SOB and not able to see Dr Ouida Sills today. + cough. No distress noted in triage.

## 2023-03-23 NOTE — ED Provider Notes (Signed)
[place] EMERGENCY DEPARTMENT AT [facility] Provider Note   CSN: [id number] Arrival date & time: 03/23/23  1159     History  Chief Complaint  Patient presents with   Shortness of Breath    Tamara [name] is a 89 y.o. female.   Shortness of Breath Associated symptoms: cough        Tamara [name] is a 89 y.o. female with past medical history of hypertension, hx breast cancer, anemia, dementia who presents to the Emergency Department accompanied by family member for evaluation of cough.  Family member states that she noticed productive sounding cough x 1 week.  No known fever.  Also noticed that patient seems "winded" with exertion.  She tried to see PCP today but was unable to get an appointment.  States that she is concerned that she may have COVID.  Patient reports having cough but denies any pain or shortness of breath at this time.   Home Medications Prior to Admission medications   Medication Sig Start Date End Date Taking? Authorizing Provider  acetaminophen (TYLENOL) 325 MG tablet Take 2 tablets (650 mg total) by mouth every 6 (six) hours as needed for mild pain, moderate pain, fever or headache. 11/25/22   Cleora Fleet, MD  calcium-vitamin D (OSCAL WITH D) 500-200 MG-UNIT per tablet Take 2 tablets by mouth daily with breakfast. 04/25/13   Ellouise Newer, PA-C  donepezil (ARICEPT) 10 MG tablet Take 1 tablet (10 mg total) by mouth daily. 12/11/22   Sharee Holster, NP  enoxaparin (LOVENOX) 40 MG/0.4ML injection Inject 0.4 mLs (40 mg total) into the skin daily for 5 days. 12/11/22 12/16/22  Sharee Holster, NP  feeding supplement (ENSURE ENLIVE / ENSURE PLUS) LIQD Take 237 mLs by mouth 2 (two) times daily between meals. 11/25/22   Cleora Fleet, MD  ferrous sulfate 325 (65 FE) MG tablet Take 1 tablet (325 mg total) by mouth daily with breakfast. Once a day on Monday and Thursday 11/25/22   Cleora Fleet, MD  Multiple Vitamin (MULTIVITAMIN WITH MINERALS)  TABS tablet Take 1 tablet by mouth daily. 11/26/22   Johnson, Clanford L, MD  traMADol (ULTRAM) 50 MG tablet Take 1 tablet (50 mg total) by mouth every 12 (twelve) hours as needed. 01/27/23   Oliver Barre, MD  UNABLE TO FIND Diet - Liquids: Regular Thin    [provider]  vitamin B-12 1000 MCG tablet Take 1 tablet (1,000 mcg total) by mouth daily. 11/26/22   Cleora Fleet, MD      Allergies    Patient has no known allergies.    Review of Systems   Review of Systems  Unable to perform ROS: Dementia  Respiratory:  Positive for cough and shortness of breath.     Physical Exam Updated Vital Signs BP 118/73   Pulse 80   Temp 98 F (36.7 C)   Resp 16   Ht 5\' 2"  (1.575 m)   Wt 61.5 kg   SpO2 98%   BMI 24.80 kg/m  Physical Exam Vitals and nursing note reviewed.  Constitutional:      General: She is not in acute distress.    Appearance: She is well-developed. She is not toxic-appearing.  Eyes:     Conjunctiva/sclera: Conjunctivae normal.     Pupils: Pupils are equal, round, and reactive to light.  Cardiovascular:     Rate and Rhythm: Normal rate and regular rhythm.     Pulses: Normal  pulses.  Pulmonary:     Effort: Pulmonary effort is normal.     Breath sounds: Rhonchi and rales present.     Comments: Few scattered rales at the left base.  Patient able to speak in complete sentences, do not appreciate any respiratory distress. Abdominal:     Palpations: Abdomen is soft.     Tenderness: There is no abdominal tenderness.  Musculoskeletal:        General: Normal range of motion.  Skin:    General: Skin is warm.     Capillary Refill: Capillary refill takes less than 2 seconds.  Neurological:     General: No focal deficit present.     Mental Status: She is alert.     Sensory: No sensory deficit.     Motor: No weakness.     ED Results / Procedures / Treatments   Labs (all labs ordered are listed, but only abnormal results are displayed) Labs Reviewed  CBC  WITH DIFFERENTIAL/PLATELET - Abnormal; Notable for the following components:      Result Value   RBC 3.86 (*)    Hemoglobin 11.6 (*)    RDW 15.7 (*)    Monocytes Absolute 1.1 (*)    All other components within normal limits  BASIC METABOLIC PANEL - Abnormal; Notable for the following components:   BUN 24 (*)    Calcium 8.3 (*)    All other components within normal limits  RESP PANEL BY RT-PCR (RSV, FLU A&B, COVID)  RVPGX2    EKG None  Radiology DG Chest 2 View  Result Date: 03/23/2023 CLINICAL DATA:  Shortness of breath EXAM: CHEST - 2 VIEW COMPARISON:  X-ray 11/21/2022 FINDINGS: Borderline cardiopericardial silhouette. Calcified tortuous aorta. Diffuse interstitial changes again seen. No pneumothorax, effusion. No edema. Slight increase in opacity in the left lung base. IMPRESSION: Chronic interstitial changes identified. Subtle opacity left lung base. Infiltrate is not excluded. Recommend follow-up Electronically Signed   By: Karen Kays M.D.   On: 03/23/2023 13:30    Procedures Procedures    Medications Ordered in ED Medications  cefTRIAXone (ROCEPHIN) 1 g in sodium chloride 0.9 % 100 mL IVPB (0 g Intravenous Stopped 03/23/23 1750)  azithromycin (ZITHROMAX) tablet 500 mg (500 mg Oral Given 03/23/23 1631)    ED Course/ Medical Decision Making/ A&P                                 Medical Decision Making Patient here from home for evaluation of cough.  Has dementia at baseline.  Denies any chest pain or shortness of breath.  Family member at bedside notes a productive sounding cough and concern for COVID.  No known fever at home.  On exam, patient well-appearing nontoxic.  Vital signs are reassuring.  No concerning symptoms for sepsis.  No tachycardia tachypnea or hypoxia.  Occasional cough and scattered rales heard at the left base.  Amount and/or Complexity of Data Reviewed Labs: ordered.    Details: Labs interpreted by me, respiratory panel negative, no leukocytosis,  chemistries without derangement. Radiology: ordered.    Details: Chest x-ray shows subtle opacity left lung base ECG/medicine tests: ordered.    Details: EKG shows sinus rhythm with occasional PVCs Discussion of management or test interpretation with external provider(s): Patient here with likely pneumonia.  No concerning symptoms for sepsis.  Remaining workup reassuring.  Patient ambulated in the department and had brief episode of hypoxia with sats  dropping to upper 80s after only making few steps.  Oxygen returned to mid 90s at rest.  Family member at bedside concerned about caring for her at home and requesting that she be admitted.  Will consult with hospitalist.  Discussed findings with [facility] hospitalist, Dr. Lourena Simmonds who agrees to admit           Final Clinical Impression(s) / ED Diagnoses Final diagnoses:  Hypoxia  Community acquired pneumonia of left lower lobe of lung    Rx / DC Orders ED Discharge Orders     None         Pauline Aus, PA-C 03/23/23 1752    Anders Simmonds T, DO 03/25/23 2355

## 2023-03-23 NOTE — Assessment & Plan Note (Signed)
At least moderate dementia, needs assistance with most ADLs.  Able to answer simple questions, and for the most part recognizes family, family major issue with sundowning at night.  Doxylamine helps.

## 2023-03-23 NOTE — ED Notes (Signed)
Patient ambulated in room with spo2 89%.

## 2023-03-23 NOTE — ED Provider Triage Note (Signed)
Emergency Medicine Provider Triage Evaluation Note  Tamara [name] , a 89 y.o. female  was evaluated in triage.  Pt complains of increased shortness of breath along with a wet sounding cough which is slowly worsened over the past week or so.  Patient's daughter is at the bedside who is her POA with whom she lives and has noted increased work of breathing along with a wet sounding cough which has not been productive.  No documented fevers.  Patient denies any complaints, however she does have moderate dementia..  Review of Systems  Positive: Cough, shortness of breath Negative: Denies chest pain  Physical Exam  BP 119/73 (BP Location: Right Arm)   Pulse 82   Temp 98.1 F (36.7 C) (Oral)   Resp 16   Ht 5\' 2"  (1.575 m)   Wt 61.5 kg   SpO2 97%   BMI 24.80 kg/m  Gen:   Awake, no distress   Resp:  Normal effort , wet sounding cough, wheezing on exam MSK:   Moves extremities without difficulty  Other:    Medical Decision Making  Medically screening exam initiated at 12:21 PM.  Appropriate orders placed.  Riki Altes was informed that the remainder of the evaluation will be completed by another provider, this initial triage assessment does not replace that evaluation, and the importance of remaining in the ED until their evaluation is complete.     Burgess Amor, PA-C 03/23/23 1228

## 2023-03-23 NOTE — Assessment & Plan Note (Addendum)
O2 sats down to 89% on room air with ambulation, at rest sats have remained 95 to 100%.  Chest x-ray shows chronic interstitial changes, with left lung base subtle opacity-infiltrate not excluded.  She is afebrile without leukocytosis.  Presenting with dyspnea and cough.  COVID Test is negative.  Mild hypoxia likely 2/2 PNA with baseline lung fibrosis. -Continue ceftriaxone and azithromycin -Mucolytics as needed - L/R 75cc/hr x 12hrs

## 2023-03-23 NOTE — H&P (Signed)
History and Physical    Tamara [name] QMV:[id number] DOB: Sep 15, 1933 DOA: 03/23/2023  PCP: [name], MD   Patient coming from: Home  I have personally briefly reviewed patient's old medical records in [facility]  Chief Complaint: Cough, Difficulty breathing  HPI: Tamara [name] is a 89 y.o. female with medical history significant for hypertension, diabetes mellitus, lung fibrosis, dementia, right breast cancer, aortic valve stenosis. Patient is able to answer simple questions, but unable to give details, her daughter [name] assists with the history.  Was brought to the ED with reports of difficulty breathing over the past week, with associated wet cough.  No chest pain.  No vomiting no loose stools.  No urinary complaints.  No leg swelling.  ED Course: Temperature 98.1.  Heart rate 75-85, respiratory 16-22.  Blood pressure systolic 118-141.  O2 sats down to 89% on room air with ambulation. Chest x-ray shows chronic interstitial changes, with left lung base subpleural opacity-infiltrate not excluded.  IV ceftriaxone and azithromycin started, hospitalist to admit.  Review of Systems: As per HPI all other systems reviewed and negative.  Past Medical History:  Diagnosis Date   B12 deficiency 06/08/2016   Breast cancer (HCC)    Breast cancer, right breast (HCC) 04/25/2013   Cancer (HCC)    Claustrophobia    Claustrophobia    Dementia (HCC)    Hypertension    Iron deficiency anemia 12/15/2016   Malignant neoplasm of upper-outer quadrant of RIGHT female breast (HCC) 04/25/2013   Stage IA (T1b, N0, M0) invasive mucinous breast carcinoma, S/P right breast lumpectomy by Dr. Lovell Sheehan on 03/08/2013 with 1 negative sentinel node. Cancer was identified as ER+ 100%, PR+ 85%, Her2 negative, and Ki-67 marker at 17%. Oncotype Dx score of 20 with 13% 10 year risk of distant recurrence and absolute benefit of chemotherapy at 10 years in this risk category is negligible and less than 1%.   Osteoporosis      Past Surgical History:  Procedure Laterality Date   ABDOMINAL HYSTERECTOMY     CATARACT EXTRACTION, BILATERAL     FEMUR IM NAIL Right 11/22/2022   Procedure: INTRAMEDULLARY (IM) RETROGRADE FEMORAL NAILING;  Surgeon: Oliver Barre, MD;  Location: [facility];  Service: Orthopedics;  Laterality: Right;   HIP ARTHROPLASTY Right 10/09/2022   Procedure: ARTHROPLASTY BIPOLAR HIP (HEMIARTHROPLASTY);  Surgeon: Vickki Hearing, MD;  Location: [facility];  Service: Orthopedics;  Laterality: Right;   PARTIAL MASTECTOMY WITH NEEDLE LOCALIZATION AND AXILLARY SENTINEL LYMPH NODE BX Right 03/08/2013   Procedure: PARTIAL MASTECTOMY WITH NEEDLE LOCALIZATION AND AXILLARY SENTINEL LYMPH NODE BX;  Surgeon: Dalia Heading, MD;  Location: [facility];  Service: General;  Laterality: Right;  Sentinel Node Bx @ 7:30am Needle Loc @ 8:00am     reports that she quit smoking about 56 years ago. Her smoking use included cigarettes. She started smoking about 96 years ago. She has a 20 pack-year smoking history. She has never used smokeless tobacco. She reports that she does not drink alcohol and does not use drugs.  Allergies  Allergen Reactions   Tramadol Other (See Comments)    Made her feel "loopy", excessive scratching    Family History  Problem Relation Age of Onset   Diabetes Mother    Cancer Father    Cancer Sister    Cancer Brother    Prior to Admission medications   Medication Sig Start Date End Date Taking? Authorizing Provider  acetaminophen (TYLENOL) 325 MG tablet Take 2 tablets (  650 mg total) by mouth every 6 (six) hours as needed for mild pain, moderate pain, fever or headache. 11/25/22  Yes Johnson, Clanford L, MD  alendronate (FOSAMAX) 70 MG tablet Take 70 mg by mouth once a week. 01/05/23  Yes [provider]  calcium-vitamin D (OSCAL WITH D) 500-200 MG-UNIT per tablet Take 2 tablets by mouth daily with breakfast. 04/25/13  Yes Kefalas, Maurine Minister, PA-C  donepezil (ARICEPT) 10 MG tablet Take 1 tablet  (10 mg total) by mouth daily. Patient taking differently: Take 10 mg by mouth at bedtime. 12/11/22  Yes Sharee Holster, NP  doxylamine, Sleep, (UNISOM) 25 MG tablet Take 25 mg by mouth at bedtime as needed for sleep.   Yes [provider]    Physical Exam: Vitals:   03/23/23 1605 03/23/23 1645 03/23/23 1700 03/23/23 1808  BP: 131/79 129/73 (!) 127/95 (!) 141/79  Pulse: 82 76 75 80  Resp: 20 20 20    Temp:    97.7 F (36.5 C)  TempSrc:    Oral  SpO2: 98% 95% 100% 98%  Weight:      Height:        Constitutional: NAD, calm, comfortable Vitals:   03/23/23 1605 03/23/23 1645 03/23/23 1700 03/23/23 1808  BP: 131/79 129/73 (!) 127/95 (!) 141/79  Pulse: 82 76 75 80  Resp: 20 20 20    Temp:    97.7 F (36.5 C)  TempSrc:    Oral  SpO2: 98% 95% 100% 98%  Weight:      Height:       Eyes: PERRL, lids and conjunctivae normal ENMT: Mucous membranes are moist.   Neck: normal, supple, no masses, no thyromegaly Respiratory: Anterior auscultation - clear to auscultation bilaterally, no wheezing, no crackles. Normal respiratory effort. No accessory muscle use.  Cardiovascular: Regular rate and rhythm, no murmurs / rubs / gallops. No extremity edema.  Extremities warm. Abdomen: no tenderness, no masses palpated. No hepatosplenomegaly. Bowel sounds positive.  Musculoskeletal: no clubbing / cyanosis. No joint deformity upper and lower extremities.  Skin: no rashes, lesions, ulcers. No induration Neurologic: No facial asymmetry, speech fluent, moving extremity spontaneously.  Psychiatric: Awake and alert oriented to person, not so much to situation.   Labs on Admission: I have personally reviewed following labs and imaging studies  CBC: Recent Labs  Lab 03/23/23 1250  WBC 7.6  NEUTROABS 4.9  HGB 11.6*  HCT 36.3  MCV 94.0  PLT 212   Basic Metabolic Panel: Recent Labs  Lab 03/23/23 1250  NA 140  K 4.2  CL 107  CO2 28  GLUCOSE 97  BUN 24*  CREATININE 0.91  CALCIUM 8.3*    Radiological Exams on Admission: DG Chest 2 View  Result Date: 03/23/2023 CLINICAL DATA:  Shortness of breath EXAM: CHEST - 2 VIEW COMPARISON:  X-ray 11/21/2022 FINDINGS: Borderline cardiopericardial silhouette. Calcified tortuous aorta. Diffuse interstitial changes again seen. No pneumothorax, effusion. No edema. Slight increase in opacity in the left lung base. IMPRESSION: Chronic interstitial changes identified. Subtle opacity left lung base. Infiltrate is not excluded. Recommend follow-up Electronically Signed   By: Karen Kays M.D.   On: 03/23/2023 13:30    EKG: Independently reviewed.  Sinus rhythm, rate 84, QTc 437.  PVCs present.  No significant change from prior.  Assessment/Plan Principal Problem:   Acute hypoxic respiratory failure (HCC) Active Problems:   Dementia without behavioral disturbance (HCC)   Hypertension associated with type 2 diabetes mellitus (HCC)   Assessment and Plan: *  Acute hypoxic respiratory failure (HCC) O2 sats down to 89% on room air with ambulation, at rest sats have remained 95 to 100%.  Chest x-ray shows chronic interstitial changes, with left lung base subtle opacity-infiltrate not excluded.  She is afebrile without leukocytosis.  Presenting with dyspnea and cough.  COVID Test is negative.  Mild hypoxia likely 2/2 PNA with baseline lung fibrosis. -Continue ceftriaxone and azithromycin -Mucolytics as needed - L/R 75cc/hr x 12hrs  Hypertension associated with type 2 diabetes mellitus (HCC) Stable.  Not on medications for diabetes or hypertension.  Last A1c 09/2022 was 5.9  Dementia without behavioral disturbance (HCC) At least moderate dementia, needs assistance with most ADLs.  Able to answer simple questions, and for the most part recognizes family, family major issue with sundowning at night.  Doxylamine helps.    DVT prophylaxis: Lovenox Code Status: DNR- Confirmed with daughter [name] at bedside Family Communication: Daughter [name] at  bedside is his DPOA. Disposition Plan:  ~ 1 -2 days Consults called: None  Admission status:  Obs Tele    Author: Onnie Boer, MD 03/23/2023 7:29 PM  For on call review [email or web address].

## 2023-03-23 NOTE — Assessment & Plan Note (Signed)
Stable.  Not on medications for diabetes or hypertension.  Last A1c 09/2022 was 5.9

## 2023-03-23 NOTE — ED Notes (Signed)
Report called to whitney

## 2023-03-24 DIAGNOSIS — J9601 Acute respiratory failure with hypoxia: Secondary | ICD-10-CM

## 2023-03-24 LAB — BASIC METABOLIC PANEL
Anion gap: 10 (ref 5–15)
BUN: 23 mg/dL (ref 8–23)
CO2: 25 mmol/L (ref 22–32)
Calcium: 8.2 mg/dL — ABNORMAL LOW (ref 8.9–10.3)
Chloride: 106 mmol/L (ref 98–111)
Creatinine, Ser: 0.85 mg/dL (ref 0.44–1.00)
GFR, Estimated: >60 mL/min (ref >60)
Glucose, Bld: 88 mg/dL (ref 70–99)
Potassium: 3.8 mmol/L (ref 3.5–5.1)
Sodium: 141 mmol/L (ref 135–145)

## 2023-03-24 LAB — CBC
HCT: 31.6 % — ABNORMAL LOW (ref 36.0–46.0)
Hemoglobin: 10.3 g/dL — ABNORMAL LOW (ref 12.0–15.0)
MCH: 30.3 pg (ref 26.0–34.0)
MCHC: 32.6 g/dL (ref 30.0–36.0)
MCV: 92.9 fL (ref 80.0–100.0)
Platelets: 196 K/uL (ref 150–400)
RBC: 3.4 MIL/uL — ABNORMAL LOW (ref 3.87–5.11)
RDW: 15.7 % — ABNORMAL HIGH (ref 11.5–15.5)
WBC: 6 K/uL (ref 4.0–10.5)
nRBC: 0 % (ref 0.0–0.2)

## 2023-03-24 NOTE — Evaluation (Signed)
Physical Therapy Evaluation Patient Details Name: Tamara [name] MRN: [id number] DOB: 1933-08-10 Today's Date: 03/24/2023  History of Present Illness  Tamara [name] is a 89 y.o. female with medical history significant for hypertension, diabetes mellitus, lung fibrosis, dementia, right breast cancer, aortic valve stenosis.  Patient is able to answer simple questions, but unable to give details, her daughter Tamara [name] assists with the history.  Was brought to the ED with reports of difficulty breathing over the past week, with associated wet cough.  No chest pain.  No vomiting no loose stools.  No urinary complaints.  No leg swelling.   Clinical Impression  Patient requires repeated verbal/tactile cueing mostly due to apprehensive to get up and possible mild confusion.  Patient had difficulty moving BLE during bed mobility due to weakness/discomfort, had most difficulty completing sit to stands due to BLE and limited to a few side steps at bedside before requesting to sit due to c/o fatigue.  Patient tolerated sitting up in chair after therapy - nursing staff aware.  Patient will benefit from continued skilled physical therapy in hospital and recommended venue below to increase strength, balance, endurance for safe ADLs and gait.          If plan is discharge home, recommend the following: A lot of help with walking and/or transfers;A lot of help with bathing/dressing/bathroom;Help with stairs or ramp for entrance;Assistance with cooking/housework   Can travel by private vehicle   No    Equipment Recommendations None recommended by PT  Recommendations for Other Services       Functional Status Assessment Patient has had a recent decline in their functional status and demonstrates the ability to make significant improvements in function in a reasonable and predictable amount of time.     Precautions / Restrictions Precautions Precautions: Fall Restrictions Weight Bearing Restrictions: No       Mobility  Bed Mobility Overal bed mobility: Needs Assistance Bed Mobility: Supine to Sit     Supine to sit: Min assist, Mod assist, HOB elevated     General bed mobility comments: increased time, labored movement    Transfers Overall transfer level: Needs assistance Equipment used: Rolling walker (2 wheels) Transfers: Sit to/from Stand, Bed to chair/wheelchair/BSC Sit to Stand: Mod assist   Step pivot transfers: Min assist, Mod assist       General transfer comment: had most difficulty completing sit to stands due to BLE weakness    Ambulation/Gait Ambulation/Gait assistance: Mod assist Gait Distance (Feet): 5 Feet Assistive device: Rolling walker (2 wheels) Gait Pattern/deviations: Decreased step length - right, Decreased step length - left, Decreased stride length Gait velocity: slow     General Gait Details: limited to a few slow labored side steps before requesting to sit due to c/o fatigue  Stairs            Wheelchair Mobility     Tilt Bed    Modified Rankin (Stroke Patients Only)       Balance Overall balance assessment: Needs assistance Sitting-balance support: Feet supported, No upper extremity supported Sitting balance-Leahy Scale: Fair Sitting balance - Comments: fair/good seated at EOB   Standing balance support: Reliant on assistive device for balance, During functional activity, Bilateral upper extremity supported Standing balance-Leahy Scale: Poor Standing balance comment: fair/poor using RW                             Pertinent Vitals/Pain Pain Assessment Pain Assessment:  Faces Faces Pain Scale: Hurts little more Pain Location: BLE with pressure Pain Descriptors / Indicators: Sore, Guarding Pain Intervention(s): Limited activity within patient's tolerance, Monitored during session, Repositioned    Home Living Family/patient expects to be discharged to:: Private residence Living Arrangements: Children Available  Help at Discharge: Family;Personal care attendant;Other (Comment) Type of Home: House Home Access: Stairs to enter Entrance Stairs-Rails: Right Entrance Stairs-Number of Steps: 4   Home Layout: Two level;Able to live on main level with bedroom/bathroom Home Equipment: Rolling Walker (2 wheels);BSC/3in1;Shower seat - built in;Wheelchair - manual      Prior Function Prior Level of Function : Needs assist       Physical Assist : Mobility (physical);ADLs (physical) Mobility (physical): Bed mobility;Transfers;Gait;Stairs ADLs (physical): Dressing;Bathing;IADLs Mobility Comments: [place] ambulator with RW and assist. Assisted for bed mobility. ADLs Comments: Assist for bathing, dressing, and IADL's.     Extremity/Trunk Assessment   Upper Extremity Assessment Upper Extremity Assessment: Generalized weakness    Lower Extremity Assessment Lower Extremity Assessment: Generalized weakness    Cervical / Trunk Assessment Cervical / Trunk Assessment: Normal  Communication   Communication Communication: No apparent difficulties  Cognition Arousal: Alert Behavior During Therapy: Anxious Overall Cognitive Status: History of cognitive impairments - at baseline                                 General Comments: requires much encouragement for participation with therapy        General Comments      Exercises     Assessment/Plan    PT Assessment Patient needs continued PT services  PT Problem List Decreased strength;Decreased activity tolerance;Decreased balance;Decreased mobility       PT Treatment Interventions DME instruction;Gait training;Stair training;Functional mobility training;Therapeutic activities;Therapeutic exercise;Balance training;Patient/family education    PT Goals (Current goals can be found in the Care Plan section)  Acute Rehab PT Goals Patient Stated Goal: return  home with family to assist PT Goal Formulation: With patient Time For Goal  Achievement: 04/07/23 Potential to Achieve Goals: Good    Frequency Min 3X/week     Co-evaluation               AM-PAC PT "6 Clicks" Mobility  Outcome Measure Help needed turning from your back to your side while in a flat bed without using bedrails?: A Little Help needed moving from lying on your back to sitting on the side of a flat bed without using bedrails?: A Little Help needed moving to and from a bed to a chair (including a wheelchair)?: A Lot Help needed standing up from a chair using your arms (e.g., wheelchair or bedside chair)?: A Lot Help needed to walk in hospital room?: A Lot Help needed climbing 3-5 steps with a railing? : A Lot 6 Click Score: 14    End of Session   Activity Tolerance: Patient tolerated treatment well;Patient limited by fatigue Patient left: in chair;with call bell/phone within reach Nurse Communication: Mobility status PT Visit Diagnosis: Unsteadiness on feet (R26.81);Other abnormalities of gait and mobility (R26.89);Muscle weakness (generalized) (M62.81)    Time: 4540-9811 PT Time Calculation (min) (ACUTE ONLY): 24 min   Charges:   PT Evaluation $PT Eval Moderate Complexity: 1 Mod PT Treatments $Therapeutic Activity: 23-37 mins PT General Charges $$ ACUTE PT VISIT: 1 Visit         2:59 PM, 03/24/23 Ocie Bob, MPT Physical Therapist with [facility] [phone] office [phone] mobile phone

## 2023-03-24 NOTE — TOC Initial Note (Signed)
Transition of Care [facility]) - Initial/Assessment Note    Patient Details  Name: Tamara [name] MRN: [id number] Date of Birth: May 29, 1934  [facility] CM/SW Contact:    Karn Cassis, LCSW Phone Number: 03/24/2023, 2:31 PM  Clinical Narrative:  Pt admitted due to acute hypoxic respiratory failure. Assessment completed with pt's daughter, [name]. Pt lives with [name] and pt's granddaughter is with pt during the day. PT evaluated pt and recommend SNF. Discussed placement process and Medicare.gov for ratings. Will send referral to [facility] and start auth.                  Expected Discharge Plan: Skilled Nursing Facility Barriers to Discharge: Continued Medical Work up   Patient Goals and CMS Choice Patient states their goals for this hospitalization and ongoing recovery are:: SNF   Choice offered to / list presented to : Adult Children [facility] ownership interest in [facility].provided to:: Adult Children    Expected Discharge Plan and Services In-house Referral: Clinical Social Work   Post Acute Care Choice: Skilled Nursing Facility Living arrangements for the past 2 months: Single Family Home                                      Prior Living Arrangements/Services Living arrangements for the past 2 months: Single Family Home Lives with:: Adult Children Patient language and need for interpreter reviewed:: Yes Do you feel safe going back to the place where you live?: Yes      Need for Family Participation in Patient Care: Yes (Comment) Care giver support system in place?: Yes (comment)   Criminal Activity/Legal Involvement Pertinent to Current Situation/Hospitalization: No - Comment as needed  Activities of Daily Living Home Assistive Devices/Equipment: Walker (specify type) (front wheel) ADL Screening (condition at time of admission) Patient's cognitive ability adequate to safely complete daily activities?: No Is the  patient deaf or have difficulty hearing?: No Does the patient have difficulty seeing, even when wearing glasses/contacts?: No Does the patient have difficulty concentrating, remembering, or making decisions?: Yes Patient able to express need for assistance with ADLs?: Yes Does the patient have difficulty dressing or bathing?: Yes Independently performs ADLs?: No Does the patient have difficulty walking or climbing stairs?: Yes Weakness of Legs: Both Weakness of Arms/Hands: Both  Permission Sought/Granted                  Emotional Assessment       Orientation: : Oriented to Self Alcohol / Substance Use: Not Applicable Psych Involvement: No (comment)  Admission diagnosis:  Hypoxia [R09.02] Community acquired pneumonia of left lower lobe of lung [J18.9] Acute hypoxic respiratory failure (HCC) [J96.01] Patient Active Problem List   Diagnosis Date Noted   Acute hypoxic respiratory failure (HCC) 03/23/2023   Closed comminuted intra-articular fracture of distal femur, right, with delayed healing, subsequent encounter 11/21/2022   Closed displaced comminuted fracture of shaft of right femur (HCC) 11/21/2022   Iron deficiency 10/27/2022   Aortic atherosclerosis (HCC) 10/19/2022   Hypertension associated with type 2 diabetes mellitus (HCC) 10/19/2022   Cystitis with hematuria 10/11/2022   Postoperative fever 10/10/2022   Closed fracture of right hip (HCC) 10/08/2022   Dementia without behavioral disturbance (HCC) 10/08/2022   Fall at home, initial encounter 10/08/2022   Aortic valve stenosis, nonrheumatic 03/24/2022   Pre-diabetes 03/24/2022   Disorder of mitral valve  03/24/2022   Essential (primary) hypertension 03/24/2022   Fibrosis lung (HCC) 03/24/2022   Acute blood loss anemia 12/15/2016   B12 deficiency 06/08/2016   Breast CA (HCC) 06/26/2014   Malignant neoplasm of upper-outer quadrant of RIGHT female breast (HCC) 04/25/2013   Osteoporosis 08/10/2007   PCP:  [name], MD Pharmacy:   [facility] [address], [place] - [address] [place] [address] [place] [address] Phone: [phone] Fax: [phone]  [facility] [place] [facility], [place] - [address]  [place] [address] Phone: [phone] Fax: [phone]     Social Determinants of Health (SDOH) Social History: SDOH Screenings   Food Insecurity: No Food Insecurity (03/23/2023)  Housing: Low Risk  (03/23/2023)  Transportation Needs: No Transportation Needs (03/23/2023)  Utilities: Not At Risk (03/23/2023)  Depression (PHQ2-9): Low Risk  (10/14/2022)  Tobacco Use: Medium Risk (03/23/2023)   SDOH Interventions:     Readmission Risk Interventions    11/23/2022   10:51 AM  Readmission Risk Prevention Plan  Transportation Screening Complete  PCP or Specialist Appt within 5-7 Days Not Complete  Home Care Screening Complete  Medication Review (RN CM) Complete

## 2023-03-24 NOTE — Care Management Obs Status (Signed)
MEDICARE OBSERVATION STATUS NOTIFICATION   Patient Details  Name: TENNILLE SANCHEZ MRN: [id number] Date of Birth: 1934/02/24   Medicare Observation Status Notification Given:   Yes    Corey Harold 03/24/2023, 3:38 PM

## 2023-03-24 NOTE — [place] FL2 (Signed)
[place] MEDICAID FL2 LEVEL OF CARE FORM     IDENTIFICATION  Patient Name: Tamara [name] Birthdate: Nov 30, 1933 Sex: female Admission Date (Current Location): 03/23/2023  [facility] and [place] Number:  [employer] and Address:  [facility],  [address], [place] [address]      Provider Number: [id number]  Attending Physician Name and Address:  Tyrone Nine, MD  Relative Name and Phone Number:       Current Level of Care: Hospital Recommended Level of Care: Skilled Nursing Facility Prior Approval Number:    Date Approved/Denied:   PASRR Number: [id number]  Discharge Plan: SNF    Current Diagnoses: Patient Active Problem List   Diagnosis Date Noted   Acute hypoxic respiratory failure (HCC) 03/23/2023   Closed comminuted intra-articular fracture of distal femur, right, with delayed healing, subsequent encounter 11/21/2022   Closed displaced comminuted fracture of shaft of right femur (HCC) 11/21/2022   Iron deficiency 10/27/2022   Aortic atherosclerosis (HCC) 10/19/2022   Hypertension associated with type 2 diabetes mellitus (HCC) 10/19/2022   Cystitis with hematuria 10/11/2022   Postoperative fever 10/10/2022   Closed fracture of right hip (HCC) 10/08/2022   Dementia without behavioral disturbance (HCC) 10/08/2022   Fall at home, initial encounter 10/08/2022   Aortic valve stenosis, nonrheumatic 03/24/2022   Pre-diabetes 03/24/2022   Disorder of mitral valve 03/24/2022   Essential (primary) hypertension 03/24/2022   Fibrosis lung (HCC) 03/24/2022   Acute blood loss anemia 12/15/2016   B12 deficiency 06/08/2016   Breast CA (HCC) 06/26/2014   Malignant neoplasm of upper-outer quadrant of RIGHT female breast (HCC) 04/25/2013   Osteoporosis 08/10/2007    Orientation RESPIRATION BLADDER Height & Weight     Self  Normal External catheter Weight: 135 lb 9.3 oz (61.5 kg) Height:  5\' 2"  (157.5 cm)  BEHAVIORAL SYMPTOMS/MOOD NEUROLOGICAL BOWEL  NUTRITION STATUS      Incontinent Diet (Regular. See d/c summary for updates.)  AMBULATORY STATUS COMMUNICATION OF NEEDS Skin   Extensive Assist Verbally Skin abrasions, Other (Comment) (Redness to right hip/knee)                       Personal Care Assistance Level of Assistance  Bathing, Feeding, Dressing Bathing Assistance: Maximum assistance Feeding assistance: Limited assistance Dressing Assistance: Maximum assistance     Functional Limitations Info  Sight, Hearing, Speech Sight Info: Adequate Hearing Info: Adequate Speech Info: Adequate    SPECIAL CARE FACTORS FREQUENCY  PT (By licensed PT)     PT Frequency: 5x weekly              Contractures      Additional Factors Info  Code Status, Allergies Code Status Info: DNR- limited Allergies Info: Tramadol           Current Medications (03/24/2023):  This is the current hospital active medication list Current Facility-Administered Medications  Medication Dose Route Frequency Provider Last Rate Last Admin   acetaminophen (TYLENOL) tablet 650 mg  650 mg Oral Q6H PRN Emokpae, Ejiroghene E, MD   650 mg at 03/23/23 2157   Or   acetaminophen (TYLENOL) suppository 650 mg  650 mg Rectal Q6H PRN Emokpae, Ejiroghene E, MD       azithromycin (ZITHROMAX) 500 mg in sodium chloride 0.9 % 250 mL IVPB  500 mg Intravenous Q24H Emokpae, Ejiroghene E, MD       cefTRIAXone (ROCEPHIN) 2 g in sodium chloride 0.9 % 100 mL IVPB  2 g Intravenous Q24H Emokpae, Ejiroghene E, MD       donepezil (ARICEPT) tablet 10 mg  10 mg Oral QHS Emokpae, Ejiroghene E, MD   10 mg at 03/23/23 2156   doxylamine (Sleep) (UNISOM) tablet 25 mg  25 mg Oral QHS PRN Emokpae, Ejiroghene E, MD       enoxaparin (LOVENOX) injection 40 mg  40 mg Subcutaneous Q24H Emokpae, Ejiroghene E, MD   40 mg at 03/23/23 2157   guaiFENesin-dextromethorphan (ROBITUSSIN DM) 100-10 MG/5ML syrup 15 mL  15 mL Oral Q8H Emokpae, Ejiroghene E, MD   15 mL at 03/24/23 0645   ondansetron  (ZOFRAN) tablet 4 mg  4 mg Oral Q6H PRN Emokpae, Ejiroghene E, MD       Or   ondansetron (ZOFRAN) injection 4 mg  4 mg Intravenous Q6H PRN Emokpae, Ejiroghene E, MD       polyethylene glycol (MIRALAX / GLYCOLAX) packet 17 g  17 g Oral Daily PRN Emokpae, Ejiroghene E, MD         Discharge Medications: Please see discharge summary for a list of discharge medications.  Relevant Imaging Results:  Relevant Lab Results:   Additional Information SSN: [id number]  Karn Cassis, LCSW

## 2023-03-24 NOTE — Progress Notes (Signed)
Mobility Specialist Progress Note:    03/24/23 1130  Mobility  Activity Dangled on edge of bed  Level of Assistance Minimal assist, patient does 75% or more  Assistive Device None  Range of Motion/Exercises Active;All extremities  Activity Response Tolerated well  Mobility Referral Yes  $Mobility charge 1 Mobility  Mobility Specialist Start Time (ACUTE ONLY) 1130  Mobility Specialist Stop Time (ACUTE ONLY) 1145  Mobility Specialist Time Calculation (min) (ACUTE ONLY) 15 min   Pt received in bed, agreeable to mobility. Required MinA to sit EOB. Pt declined ambulation d/t anxiety of her hip and femur. Sitting EOB pt did BL leg kicks. Tolerated well, asx throughout. Returned pt supine, family in room. Bed alarm on, all needs met.   Lawerance Bach Mobility Specialist Please contact via Special educational needs teacher or  Rehab office at [phone]

## 2023-03-24 NOTE — Plan of Care (Signed)
  Problem: Acute Rehab PT Goals(only PT should resolve) Goal: Pt Will Go Supine/Side To Sit Outcome: Progressing Flowsheets (Taken 03/24/2023 1501) Pt will go Supine/Side to Sit: with contact guard assist Goal: Patient Will Transfer Sit To/From Stand Outcome: Progressing Flowsheets (Taken 03/24/2023 1501) Patient will transfer sit to/from stand: with minimal assist Goal: Pt Will Transfer Bed To Chair/Chair To Bed Outcome: Progressing Flowsheets (Taken 03/24/2023 1501) Pt will Transfer Bed to Chair/Chair to Bed: with min assist Goal: Pt Will Ambulate Outcome: Progressing Flowsheets (Taken 03/24/2023 1501) Pt will Ambulate:  25 feet  with minimal assist  with rolling walker   3:01 PM, 03/24/23 Ocie Bob, MPT Physical Therapist with [facility] [phone] office [phone] mobile phone

## 2023-03-24 NOTE — Progress Notes (Signed)
[facility] PROGRESS NOTE  TAVARES ROEDERER (DOB: 1933-12-08) ZOX:[id number] PCP: [name], MD  Brief Narrative: HPI: Tamara [name] is an 89 y.o. female with medical history significant for hypertension, diabetes mellitus, lung fibrosis, dementia, right breast cancer, aortic valve stenosis. Patient is able to answer simple questions, but unable to give details, her daughter Tamara [name] assists with the history.  Was brought to the ED with reports of difficulty breathing over the past week, with associated wet cough.  No chest pain.  No vomiting no loose stools.  No urinary complaints.  No leg swelling.   ED Course: Temperature 98.1.  Heart rate 75-85, respiratory 16-22.  Blood pressure systolic 118-141.  O2 sats down to 89% on room air with ambulation. Chest x-ray shows chronic interstitial changes, with left lung base subpleural opacity-infiltrate not excluded.  IV ceftriaxone and azithromycin started, hospitalist to admit.  Subjective: Pt is confused, in mittens this morning. She reports some shortness of breath, but she thinks this is about how it normally is. After she ate breakfast, RN attempted ambulatory pulse oximetry and stated patient was too weak to get up. PT consulted, CSW consulted.  Objective: BP 110/74 (BP Location: Left Arm)   Pulse 66   Temp 97.9 F (36.6 C) (Oral)   Resp 18   Ht 5\' 2"  (1.575 m)   Wt 61.5 kg   SpO2 98%   BMI 24.80 kg/m   Gen: Elderly female in no distress Pulm: Clear, nonlabored on room air CV: RRR, no MRG or edema GI: Soft, NT, ND, +BS  Neuro: Alert and disoriented, moves all extremities to command. Ext: Warm, no deformities Skin: No rashes, lesions or ulcers on visualized skin   Assessment & Plan: Acute hypoxic respiratory failure:  - We will continue typical antibiotics for a possible LLL pneumonia superimposed on chronic fibrotic changes (lowering threshold for hypoxemia). Plan 5 days Tx.  - Aim for SpO2 >89% and normal respiratory  effort.  Weakness: Likely due to infection - PT/OT consulted, planning rehabilitation. Unable to go home at current functional level.   Hypertension associated with type 2 diabetes mellitus (HCC) Stable.  Not on medications for diabetes or hypertension.  Last A1c 09/2022 was 5.9   Dementia without behavioral disturbance (HCC) At least moderate dementia, needs assistance with most ADLs.  Able to answer simple questions, and for the most part recognizes family, family major issue with sundowning at night.  Doxylamine helps. - Delirium precautions  Tyrone Nine, MD [facility] [email or web address] 03/24/2023, 2:53 PM

## 2023-03-24 NOTE — Care Management Obs Status (Addendum)
MEDICARE OBSERVATION STATUS NOTIFICATION   Patient Details  Name: Tamara [name] MRN: [id number] Date of Birth: 1934/02/24   Medicare Observation Status Notification Given:   Yes    Corey Harold 03/24/2023, 3:38 PM

## 2023-03-24 NOTE — Progress Notes (Signed)
Pt lives with her daughter. Her granddaughter stays with her during the day so patient has around the clock supervision with family. She requires assist with all ADLs and ambulates with a walker. No current home health services. TOC will follow.    03/24/23 0804  TOC Brief Assessment  Insurance and Status Reviewed  Patient has primary care physician Yes  Home environment has been reviewed Lives with daughter.  Prior level of function: Assist from family.  Prior/Current Home Services No current home services  Social Determinants of Health Reivew SDOH reviewed no interventions necessary  Readmission risk has been reviewed Yes  Transition of care needs no transition of care needs at this time

## 2023-03-25 DIAGNOSIS — J9601 Acute respiratory failure with hypoxia: Secondary | ICD-10-CM | POA: Diagnosis not present

## 2023-03-25 NOTE — TOC Progression Note (Signed)
Transition of Care [facility]) - Progression Note    Patient Details  Name: Tamara [name] MRN: [id number] Date of Birth: 18-Apr-1934  [facility] CM/SW Contact  Elliot Gault, LCSW Phone Number: 03/25/2023, 2:35 PM  Clinical Narrative:     TOC following. Bed offers presented to pt's daughter who accepted [facility]. Updated insurance. Berkley Harvey is still pending.  Destiny at [facility] states they can admit pt tomorrow if she gets [place].  Will follow up in AM.  Expected Discharge Plan: Skilled Nursing Facility Barriers to Discharge: Continued Medical Work up  Expected Discharge Plan and Services In-house Referral: Clinical Social Work   Post Acute Care Choice: Skilled Nursing Facility Living arrangements for the past 2 months: Single Family Home Expected Discharge Date: 03/25/23                                     Social Determinants of Health (SDOH) Interventions SDOH Screenings   Food Insecurity: No Food Insecurity (03/23/2023)  Housing: Low Risk  (03/23/2023)  Transportation Needs: No Transportation Needs (03/23/2023)  Utilities: Not At Risk (03/23/2023)  Depression (PHQ2-9): Low Risk  (10/14/2022)  Tobacco Use: Medium Risk (03/23/2023)    Readmission Risk Interventions    11/23/2022   10:51 AM  Readmission Risk Prevention Plan  Transportation Screening Complete  PCP or Specialist Appt within 5-7 Days Not Complete  Home Care Screening Complete  Medication Review (RN CM) Complete

## 2023-03-25 NOTE — Plan of Care (Signed)

## 2023-03-25 NOTE — Progress Notes (Signed)
[facility] PROGRESS NOTE  Tamara [name] (DOB: 08/05/1933) [id number] PCP: Tamara Perches, MD  Brief Narrative: HPI: Tamara [name] is an 89 y.o. female with medical history significant for hypertension, diabetes mellitus, lung fibrosis, dementia, right breast cancer, aortic valve stenosis. Patient is able to answer simple questions, but unable to give details, her daughter Tamara [name] assists with the history.  Was brought to the ED with reports of difficulty breathing over the past week, with associated wet cough.  No chest pain.  No vomiting no loose stools.  No urinary complaints.  No leg swelling.   ED Course: Temperature 98.1.  Heart rate 75-85, respiratory 16-22.  Blood pressure systolic 118-141.  O2 sats down to 89% on room air with ambulation. Chest x-ray shows chronic interstitial changes, with left lung base subpleural opacity-infiltrate not excluded.  IV ceftriaxone and azithromycin started and the patient's respiratory status has normalized. She will require SNF rehabilitation at discharge which is being pursued.   Subjective: Pt without complaints, confused. Denies any trouble breathing or pain anywhere.   Objective: BP 105/78 (BP Location: Left Arm)   Pulse 75   Temp 98.1 F (36.7 C) (Oral)   Resp 17   Ht 5\' 2"  (1.575 m)   Wt 61.5 kg   SpO2 97%   BMI 24.80 kg/m   Elderly female in no distress Clear, nonlabored, normal rate RRR, no MRG or edema Alert, not oriented but conversant.  Assessment & Plan: Acute hypoxic respiratory failure: RSV, flu, covid PCRs negative. Not producing sputum for Cx.  - We will continue typical antibiotics for a possible LLL pneumonia superimposed on chronic fibrotic changes (lowering threshold for hypoxemia). Plan 5 days Tx. Her respiratory effort and pulmonary exam are normal without supplemental oxygen at this time. - Aim for SpO2 >89% and normal respiratory effort.  Weakness: Likely due to infection - PT/OT consulted, planning  rehabilitation. Unable to go home at current functional level.   History of T2DM: Last A1c 09/2022 was 5.9%  History of HTN: No home medications, normotensive off meds.    Dementia: At least moderate dementia, needs assistance with most ADLs.  Able to answer simple questions, and for the most part recognizes family, family major issue with sundowning at night.   - Continue home doxylamine qHS and aricept. - Delirium precautions  Tyrone Nine, MD [facility] [email or web address] 03/25/2023, 8:41 AM

## 2023-03-26 DIAGNOSIS — J8410 Pulmonary fibrosis, unspecified: Secondary | ICD-10-CM | POA: Diagnosis not present

## 2023-03-26 DIAGNOSIS — F03C0 Unspecified dementia, severe, without behavioral disturbance, psychotic disturbance, mood disturbance, and anxiety: Secondary | ICD-10-CM | POA: Diagnosis not present

## 2023-03-26 DIAGNOSIS — Z96641 Presence of right artificial hip joint: Secondary | ICD-10-CM | POA: Diagnosis not present

## 2023-03-26 DIAGNOSIS — D649 Anemia, unspecified: Secondary | ICD-10-CM | POA: Diagnosis not present

## 2023-03-26 DIAGNOSIS — K5909 Other constipation: Secondary | ICD-10-CM | POA: Diagnosis not present

## 2023-03-26 DIAGNOSIS — F0390 Unspecified dementia without behavioral disturbance: Secondary | ICD-10-CM | POA: Diagnosis not present

## 2023-03-26 DIAGNOSIS — J155 Pneumonia due to Escherichia coli: Secondary | ICD-10-CM | POA: Diagnosis not present

## 2023-03-26 DIAGNOSIS — F413 Other mixed anxiety disorders: Secondary | ICD-10-CM | POA: Diagnosis not present

## 2023-03-26 DIAGNOSIS — I350 Nonrheumatic aortic (valve) stenosis: Secondary | ICD-10-CM | POA: Diagnosis not present

## 2023-03-26 DIAGNOSIS — M1990 Unspecified osteoarthritis, unspecified site: Secondary | ICD-10-CM | POA: Diagnosis not present

## 2023-03-26 DIAGNOSIS — R0902 Hypoxemia: Secondary | ICD-10-CM | POA: Diagnosis not present

## 2023-03-26 DIAGNOSIS — F419 Anxiety disorder, unspecified: Secondary | ICD-10-CM | POA: Diagnosis not present

## 2023-03-26 DIAGNOSIS — S72001D Fracture of unspecified part of neck of right femur, subsequent encounter for closed fracture with routine healing: Secondary | ICD-10-CM | POA: Diagnosis not present

## 2023-03-26 DIAGNOSIS — F319 Bipolar disorder, unspecified: Secondary | ICD-10-CM | POA: Diagnosis not present

## 2023-03-26 DIAGNOSIS — J9690 Respiratory failure, unspecified, unspecified whether with hypoxia or hypercapnia: Secondary | ICD-10-CM | POA: Diagnosis not present

## 2023-03-26 DIAGNOSIS — M62562 Muscle wasting and atrophy, not elsewhere classified, left lower leg: Secondary | ICD-10-CM | POA: Diagnosis not present

## 2023-03-26 DIAGNOSIS — J9601 Acute respiratory failure with hypoxia: Secondary | ICD-10-CM | POA: Diagnosis not present

## 2023-03-26 DIAGNOSIS — Z853 Personal history of malignant neoplasm of breast: Secondary | ICD-10-CM | POA: Diagnosis not present

## 2023-03-26 DIAGNOSIS — J189 Pneumonia, unspecified organism: Secondary | ICD-10-CM | POA: Diagnosis not present

## 2023-03-26 DIAGNOSIS — K5901 Slow transit constipation: Secondary | ICD-10-CM | POA: Diagnosis not present

## 2023-03-26 DIAGNOSIS — E1159 Type 2 diabetes mellitus with other circulatory complications: Secondary | ICD-10-CM | POA: Diagnosis not present

## 2023-03-26 DIAGNOSIS — M810 Age-related osteoporosis without current pathological fracture: Secondary | ICD-10-CM | POA: Diagnosis not present

## 2023-03-26 DIAGNOSIS — I152 Hypertension secondary to endocrine disorders: Secondary | ICD-10-CM | POA: Diagnosis not present

## 2023-03-26 DIAGNOSIS — F5105 Insomnia due to other mental disorder: Secondary | ICD-10-CM | POA: Diagnosis not present

## 2023-03-26 DIAGNOSIS — R2689 Other abnormalities of gait and mobility: Secondary | ICD-10-CM | POA: Diagnosis not present

## 2023-03-26 DIAGNOSIS — F39 Unspecified mood [affective] disorder: Secondary | ICD-10-CM | POA: Diagnosis not present

## 2023-03-26 DIAGNOSIS — M6281 Muscle weakness (generalized): Secondary | ICD-10-CM | POA: Diagnosis not present

## 2023-03-26 DIAGNOSIS — F0150 Vascular dementia without behavioral disturbance: Secondary | ICD-10-CM | POA: Diagnosis not present

## 2023-03-26 DIAGNOSIS — G4700 Insomnia, unspecified: Secondary | ICD-10-CM | POA: Diagnosis not present

## 2023-03-26 DIAGNOSIS — R262 Difficulty in walking, not elsewhere classified: Secondary | ICD-10-CM | POA: Diagnosis not present

## 2023-03-26 DIAGNOSIS — D519 Vitamin B12 deficiency anemia, unspecified: Secondary | ICD-10-CM | POA: Diagnosis not present

## 2023-03-26 DIAGNOSIS — M62561 Muscle wasting and atrophy, not elsewhere classified, right lower leg: Secondary | ICD-10-CM | POA: Diagnosis not present

## 2023-03-26 DIAGNOSIS — M818 Other osteoporosis without current pathological fracture: Secondary | ICD-10-CM | POA: Diagnosis not present

## 2023-03-26 MED ORDER — AZITHROMYCIN 500 MG PO TABS: 500.0000 mg | ORAL_TABLET | Freq: Every day | ORAL | 0 refills | Status: AC

## 2023-03-26 MED ORDER — PREDNISONE 20 MG PO TABS: 20.0000 mg | ORAL_TABLET | Freq: Every day | ORAL | 0 refills | Status: AC

## 2023-03-26 MED ORDER — POLYETHYLENE GLYCOL 3350 17 G PO PACK: 17.0000 g | PACK | Freq: Every day | ORAL | 0 refills | Status: DC

## 2023-03-26 MED ORDER — CEPHALEXIN 500 MG PO CAPS: 500.0000 mg | ORAL_CAPSULE | Freq: Three times a day (TID) | ORAL | 0 refills | Status: AC

## 2023-03-26 NOTE — TOC Transition Note (Signed)
Transition of Care [facility]) - CM/SW Discharge Note   Patient Details  Name: Tamara [name] MRN: [id number] Date of Birth: 1934/05/25  [facility]) CM/SW Contact:  Elliot Gault, LCSW Phone Number: 03/26/2023, 10:52 AM   Clinical Narrative:     Pt has received insurance auth for SNF. Updated Destiny at [facility] and they can admit pt today. Pt/dtr remain in agreement with dc plan.  DC clinical sent electronically. RN to call report. [facility] arranged.  No other TOC needs for dc.  Final next level of care: Skilled Nursing Facility Barriers to Discharge: Barriers Resolved   Patient Goals and CMS Choice CMS Medicare.gov Compare Post Acute Care list provided to:: Patient Represenative (must comment) Choice offered to / list presented to : Adult Children  Discharge Placement                Patient [facility] bed [facility]: [facility] Patient to be transferred to facility by: [facility] Name of family member notified: Aggie Cosier Patient and family notified of of transfer: 03/26/23  Discharge Plan and Services Additional resources added to the After Visit Summary for   In-house Referral: Clinical Social Work   Post Acute Care Choice: Skilled Nursing Facility                               Social Determinants of Health (SDOH) Interventions SDOH Screenings   Food Insecurity: No Food Insecurity (03/23/2023)  Housing: Low Risk  (03/23/2023)  Transportation Needs: No Transportation Needs (03/23/2023)  Utilities: Not At Risk (03/23/2023)  Depression (PHQ2-9): Low Risk  (10/14/2022)  Tobacco Use: Medium Risk (03/23/2023)     Readmission Risk Interventions    11/23/2022   10:51 AM  Readmission Risk Prevention Plan  Transportation Screening Complete  PCP or Specialist Appt within 5-7 Days Not Complete  Home Care Screening Complete  Medication Review (RN CM) Complete

## 2023-03-26 NOTE — Discharge Summary (Signed)
Tamara [name], is a 89 y.o. female  DOB 1933-10-08  MRN [id number].  Admission date:  03/23/2023  Admitting Physician  Onnie Boer, MD  Discharge Date:  03/26/2023   Primary MD  [name], MD  Recommendations for primary care physician for things to follow:   1)Repeat CBC  within 1 week  Admission Diagnosis  Hypoxia [R09.02] Community acquired pneumonia of left lower lobe of lung [J18.9] Acute hypoxic respiratory failure (HCC) [J96.01]   Discharge Diagnosis  Hypoxia [R09.02] Community acquired pneumonia of left lower lobe of lung [J18.9] Acute hypoxic respiratory failure (HCC) [J96.01]    Principal Problem:   Acute hypoxic respiratory failure (HCC) Active Problems:   Dementia without behavioral disturbance (HCC)   Hypertension associated with type 2 diabetes mellitus (HCC)      Past Medical History:  Diagnosis Date   B12 deficiency 06/08/2016   Breast cancer (HCC)    Breast cancer, right breast (HCC) 04/25/2013   Cancer (HCC)    Claustrophobia    Claustrophobia    Dementia (HCC)    Hypertension    Iron deficiency anemia 12/15/2016   Malignant neoplasm of upper-outer quadrant of RIGHT female breast (HCC) 04/25/2013   Stage IA (T1b, N0, M0) invasive mucinous breast carcinoma, S/P right breast lumpectomy by Dr. Lovell Sheehan on 03/08/2013 with 1 negative sentinel node. Cancer was identified as ER+ 100%, PR+ 85%, Her2 negative, and Ki-67 marker at 17%. Oncotype Dx score of 20 with 13% 10 year risk of distant recurrence and absolute benefit of chemotherapy at 10 years in this risk category is negligible and less than 1%.   Osteoporosis     Past Surgical History:  Procedure Laterality Date   ABDOMINAL HYSTERECTOMY     CATARACT EXTRACTION, BILATERAL     FEMUR IM NAIL Right 11/22/2022   Procedure: INTRAMEDULLARY (IM) RETROGRADE FEMORAL NAILING;  Surgeon: Oliver Barre, MD;  Location: [facility];  Service:  Orthopedics;  Laterality: Right;   HIP ARTHROPLASTY Right 10/09/2022   Procedure: ARTHROPLASTY BIPOLAR HIP (HEMIARTHROPLASTY);  Surgeon: Vickki Hearing, MD;  Location: [facility];  Service: Orthopedics;  Laterality: Right;   PARTIAL MASTECTOMY WITH NEEDLE LOCALIZATION AND AXILLARY SENTINEL LYMPH NODE BX Right 03/08/2013   Procedure: PARTIAL MASTECTOMY WITH NEEDLE LOCALIZATION AND AXILLARY SENTINEL LYMPH NODE BX;  Surgeon: Dalia Heading, MD;  Location: [facility];  Service: General;  Laterality: Right;  Sentinel Node Bx @ 7:30am Needle Loc @ 8:00am     HPI  from the history and physical done on the day of admission:   Chief Complaint: Cough, Difficulty breathing   HPI: Tamara [name] is a 89 y.o. female with medical history significant for hypertension, diabetes mellitus, lung fibrosis, dementia, right breast cancer, aortic valve stenosis. Patient is able to answer simple questions, but unable to give details, her daughter Tamara [name] assists with the history.  Was brought to the ED with reports of difficulty breathing over the past week, with associated wet cough.  No chest pain.  No vomiting no loose stools.  No urinary complaints.  No leg swelling.   ED Course: Temperature 98.1.  Heart rate 75-85, respiratory 16-22.  Blood pressure systolic 118-141.  O2 sats down to 89% on room air with ambulation. Chest x-ray shows chronic interstitial changes, with left lung base subpleural opacity-infiltrate not excluded.  IV ceftriaxone and azithromycin started, hospitalist to admit.   Review of Systems: As per HPI all other systems reviewed and negative.    Hospital Course:   Assessment and Plan: 1)Acute hypoxic respiratory failure due to presumed left-sided community-acquired pneumonia: -- RSV, flu, covid PCRs negative.  -Chest x-ray suggest LLL pneumonia superimposed on chronic fibrotic changes (lowering threshold for hypoxemia).  -Treated with Rocephin and azithromycin -Weaned off oxygen -Hypoxia  resolved-  -Okay to discharge on Keflex, azithromycin and prednisone.   2)Generalized weakness/ambulatory dysfunction- -PT eval appreciated recommends SNF rehab  3)History of T2DM: Last A1c 09/2022 was 5.9%, diet controlled, no routine medications   4)History of HTN: No home medications, normotensive off meds.    5)Dementia: At least moderate dementia, needs assistance with most ADLs.  Able to answer simple questions, and for the most part recognizes family, family major issue with sundowning at night.   - Continue home doxylamine qHS and aricept.  6) chronic normocytic and normochromic anemia--- Hgb currently above 10 which is at or above prior baseline -Repeat CBC in about a week or so  Discharge Condition: stable  Follow UP   Contact information for after-discharge care     Destination     [facility] Preferred SNF .   Service: Skilled Nursing Contact information: [address] [place] [address] [phone]                     Diet and Activity recommendation:  As advised  Discharge Instructions    Discharge Instructions     Call MD for:  difficulty breathing, headache or visual disturbances   Complete by: As directed    Call MD for:  persistant dizziness or light-headedness   Complete by: As directed    Call MD for:  persistant nausea and vomiting   Complete by: As directed    Call MD for:  temperature >100.4   Complete by: As directed    Diet - low sodium heart healthy   Complete by: As directed    Discharge instructions   Complete by: As directed    1)Repeat CBC  within 1 week   Increase activity slowly   Complete by: As directed        Discharge Medications     Allergies as of 03/26/2023       Reactions   Tramadol Other (See Comments)   Made her feel "loopy", excessive scratching        Medication List     TAKE these medications    acetaminophen 325 MG tablet Commonly known as: TYLENOL Take  2 tablets (650 mg total) by mouth every 6 (six) hours as needed for mild pain, moderate pain, fever or headache.   alendronate 70 MG tablet Commonly known as: FOSAMAX Take 70 mg by mouth once a week.   azithromycin 500 MG tablet Commonly known as: ZITHROMAX Take 1 tablet (500 mg total) by mouth daily for 3 days.   calcium-vitamin D 500-200 MG-UNIT tablet Commonly known as: OSCAL WITH D Take 2 tablets by mouth daily with breakfast.   cephALEXin 500 MG capsule Commonly known as: Keflex Take 1 capsule (500 mg total) by  mouth 3 (three) times daily for 5 days.   donepezil 10 MG tablet Commonly known as: ARICEPT Take 1 tablet (10 mg total) by mouth daily. What changed: when to take this   doxylamine (Sleep) 25 MG tablet Commonly known as: UNISOM Take 25 mg by mouth at bedtime as needed for sleep.   polyethylene glycol 17 g packet Commonly known as: MIRALAX / GLYCOLAX Take 17 g by mouth daily.   predniSONE 20 MG tablet Commonly known as: DELTASONE Take 1 tablet (20 mg total) by mouth daily with breakfast for 5 days.        Major procedures and Radiology Reports - PLEASE review detailed and final reports for all details, in brief -   DG Chest 2 View  Result Date: 03/23/2023 CLINICAL DATA:  Shortness of breath EXAM: CHEST - 2 VIEW COMPARISON:  X-ray 11/21/2022 FINDINGS: Borderline cardiopericardial silhouette. Calcified tortuous aorta. Diffuse interstitial changes again seen. No pneumothorax, effusion. No edema. Slight increase in opacity in the left lung base. IMPRESSION: Chronic interstitial changes identified. Subtle opacity left lung base. Infiltrate is not excluded. Recommend follow-up Electronically Signed   By: Karen Kays M.D.   On: 03/23/2023 13:30   DG FEMUR, MIN 2 VIEWS RIGHT  Result Date: 03/12/2023 X-rays of the right femur were obtained in clinic today.  These are compared to prior x-rays.  There are no acute injuries.  Comminuted fracture of the distal femoral  shaft remains in stable position.  Screws are not backing out.  Hardware remains stable.  There is callus formation about the fracture site.  Limited views of the right hip demonstrate that the prosthesis remains in a good stable position.  Impression: Healing right comminuted femoral shaft fracture without hardware failure or subsidence    Micro Results   Recent Results (from the past 240 hour(s))  Resp panel by RT-PCR (RSV, Flu A&B, Covid) Anterior Nasal Swab     Status: None   Collection Time: 03/23/23 12:22 PM   Specimen: Anterior Nasal Swab  Result Value Ref Range Status   SARS Coronavirus 2 by RT PCR NEGATIVE NEGATIVE Final    Comment: (NOTE) SARS-CoV-2 target nucleic acids are NOT DETECTED.  The SARS-CoV-2 RNA is generally detectable in upper respiratory specimens during the acute phase of infection. The lowest concentration of SARS-CoV-2 viral copies this assay can detect is 138 copies/mL. A negative result does not preclude SARS-Cov-2 infection and should not be used as the sole basis for treatment or other patient management decisions. A negative result may occur with  improper specimen collection/handling, submission of specimen other than nasopharyngeal swab, presence of viral mutation(s) within the areas targeted by this assay, and inadequate number of viral copies(<138 copies/mL). A negative result must be combined with clinical observations, patient history, and epidemiological information. The expected result is Negative.  Fact Sheet for Patients:  BloggerCourse.com  Fact Sheet for Healthcare Providers:  SeriousBroker.it  This test is no t yet approved or cleared by the [place] FDA and  has been authorized for detection and/or diagnosis of SARS-CoV-2 by FDA under an Emergency Use Authorization (EUA). This EUA will remain  in effect (meaning this test can be used) for the duration of the COVID-19 declaration  under Section 564(b)(1) of the Act, 21 U.S.C.section 360bbb-3(b)(1), unless the authorization is terminated  or revoked sooner.       Influenza A by PCR NEGATIVE NEGATIVE Final   Influenza B by PCR NEGATIVE NEGATIVE Final    Comment: (NOTE) The  Xpert Xpress SARS-CoV-2/FLU/RSV plus assay is intended as an aid in the diagnosis of influenza from Nasopharyngeal swab specimens and should not be used as a sole basis for treatment. Nasal washings and aspirates are unacceptable for Xpert Xpress SARS-CoV-2/FLU/RSV testing.  Fact Sheet for Patients: BloggerCourse.com  Fact Sheet for Healthcare Providers: SeriousBroker.it  This test is not yet approved or cleared by the [place] FDA and has been authorized for detection and/or diagnosis of SARS-CoV-2 by FDA under an Emergency Use Authorization (EUA). This EUA will remain in effect (meaning this test can be used) for the duration of the COVID-19 declaration under Section 564(b)(1) of the Act, 21 U.S.C. section 360bbb-3(b)(1), unless the authorization is terminated or revoked.     Resp Syncytial Virus by PCR NEGATIVE NEGATIVE Final    Comment: (NOTE) Fact Sheet for Patients: BloggerCourse.com  Fact Sheet for Healthcare Providers: SeriousBroker.it  This test is not yet approved or cleared by the [place] FDA and has been authorized for detection and/or diagnosis of SARS-CoV-2 by FDA under an Emergency Use Authorization (EUA). This EUA will remain in effect (meaning this test can be used) for the duration of the COVID-19 declaration under Section 564(b)(1) of the Act, 21 U.S.C. section 360bbb-3(b)(1), unless the authorization is terminated or revoked.  Performed at [facility], [address], [place], [place] [address]     Today   Subjective    Tamara [name] today has no new complaints  No productive cough, no  fevers  No Nausea, Vomiting or Diarrhea -          Patient has been seen and examined prior to discharge   Objective   Blood pressure 122/69, pulse 73, temperature 97.8 F (36.6 C), temperature source Oral, resp. rate 18, height 5\' 2"  (1.575 m), weight 61.5 kg, SpO2 97%.   Intake/Output Summary (Last 24 hours) at 03/26/2023 1039 Last data filed at 03/26/2023 0508 Gross per 24 hour  Intake 730 ml  Output 800 ml  Net -70 ml    Exam Gen:- Awake Alert, no acute distress  HEENT:- [place].AT, No sclera icterus Neck-Supple Neck,No JVD,.  Lungs-fair symmetrical air movement, no wheezing, no rhonchi  CV- S1, S2 normal, regular Abd-  +ve B.Sounds, Abd Soft, No tenderness,    Extremity/Skin:- No  edema,   good pulses Psych-affect is appropriate, obvious memory and cognitive deficits consistent with underlying dementia  -neuro--generalized weakness, no new focal deficits, no tremors    Data Review   CBC w Diff:  Lab Results  Component Value Date   WBC 6.0 03/24/2023   HGB 10.3 (L) 03/24/2023   HCT 31.6 (L) 03/24/2023   PLT 196 03/24/2023   LYMPHOPCT 18 03/23/2023   MONOPCT 14 03/23/2023   EOSPCT 4 03/23/2023   BASOPCT 0 03/23/2023    CMP:  Lab Results  Component Value Date   NA 141 03/24/2023   K 3.8 03/24/2023   CL 106 03/24/2023   CO2 25 03/24/2023   BUN 23 03/24/2023   CREATININE 0.85 03/24/2023   PROT 6.0 (L) 12/03/2022   ALBUMIN 2.8 (L) 12/03/2022   BILITOT 0.7 12/03/2022   ALKPHOS 105 12/03/2022   AST 39 12/03/2022   ALT 26 12/03/2022  .  Total Discharge time is about 33 minutes  Shon Hale M.D on 03/26/2023 at 10:39 AM  Go to [email or web address] -  for contact info  [facility] - Office  [phone]

## 2023-03-26 NOTE — Discharge Instructions (Signed)
1)Repeat CBC  within 1 week

## 2023-03-28 DIAGNOSIS — M818 Other osteoporosis without current pathological fracture: Secondary | ICD-10-CM | POA: Diagnosis not present

## 2023-03-28 DIAGNOSIS — K5909 Other constipation: Secondary | ICD-10-CM | POA: Diagnosis not present

## 2023-03-28 DIAGNOSIS — F0150 Vascular dementia without behavioral disturbance: Secondary | ICD-10-CM | POA: Diagnosis not present

## 2023-04-02 DIAGNOSIS — J155 Pneumonia due to Escherichia coli: Secondary | ICD-10-CM | POA: Diagnosis not present

## 2023-04-04 DIAGNOSIS — S72001D Fracture of unspecified part of neck of right femur, subsequent encounter for closed fracture with routine healing: Secondary | ICD-10-CM | POA: Diagnosis not present

## 2023-04-04 DIAGNOSIS — R262 Difficulty in walking, not elsewhere classified: Secondary | ICD-10-CM | POA: Diagnosis not present

## 2023-04-04 DIAGNOSIS — J8410 Pulmonary fibrosis, unspecified: Secondary | ICD-10-CM | POA: Diagnosis not present

## 2023-04-04 DIAGNOSIS — M6281 Muscle weakness (generalized): Secondary | ICD-10-CM | POA: Diagnosis not present

## 2023-04-14 DIAGNOSIS — F5105 Insomnia due to other mental disorder: Secondary | ICD-10-CM | POA: Diagnosis not present

## 2023-04-14 DIAGNOSIS — F03C0 Unspecified dementia, severe, without behavioral disturbance, psychotic disturbance, mood disturbance, and anxiety: Secondary | ICD-10-CM | POA: Diagnosis not present

## 2023-04-14 DIAGNOSIS — F39 Unspecified mood [affective] disorder: Secondary | ICD-10-CM | POA: Diagnosis not present

## 2023-04-14 DIAGNOSIS — F413 Other mixed anxiety disorders: Secondary | ICD-10-CM | POA: Diagnosis not present

## 2023-04-17 DIAGNOSIS — F0150 Vascular dementia without behavioral disturbance: Secondary | ICD-10-CM | POA: Diagnosis not present

## 2023-04-17 DIAGNOSIS — M818 Other osteoporosis without current pathological fracture: Secondary | ICD-10-CM | POA: Diagnosis not present

## 2023-04-26 DIAGNOSIS — Z9181 History of falling: Secondary | ICD-10-CM | POA: Diagnosis not present

## 2023-04-26 DIAGNOSIS — J8410 Pulmonary fibrosis, unspecified: Secondary | ICD-10-CM | POA: Diagnosis not present

## 2023-04-26 DIAGNOSIS — Z9011 Acquired absence of right breast and nipple: Secondary | ICD-10-CM | POA: Diagnosis not present

## 2023-04-26 DIAGNOSIS — F03B11 Unspecified dementia, moderate, with agitation: Secondary | ICD-10-CM | POA: Diagnosis not present

## 2023-04-26 DIAGNOSIS — I350 Nonrheumatic aortic (valve) stenosis: Secondary | ICD-10-CM | POA: Diagnosis not present

## 2023-04-26 DIAGNOSIS — F319 Bipolar disorder, unspecified: Secondary | ICD-10-CM | POA: Diagnosis not present

## 2023-04-26 DIAGNOSIS — D509 Iron deficiency anemia, unspecified: Secondary | ICD-10-CM | POA: Diagnosis not present

## 2023-04-26 DIAGNOSIS — Z853 Personal history of malignant neoplasm of breast: Secondary | ICD-10-CM | POA: Diagnosis not present

## 2023-04-26 DIAGNOSIS — Z8701 Personal history of pneumonia (recurrent): Secondary | ICD-10-CM | POA: Diagnosis not present

## 2023-04-26 DIAGNOSIS — Z96641 Presence of right artificial hip joint: Secondary | ICD-10-CM | POA: Diagnosis not present

## 2023-04-26 DIAGNOSIS — F40240 Claustrophobia: Secondary | ICD-10-CM | POA: Diagnosis not present

## 2023-04-26 DIAGNOSIS — I10 Essential (primary) hypertension: Secondary | ICD-10-CM | POA: Diagnosis not present

## 2023-04-26 DIAGNOSIS — Z7983 Long term (current) use of bisphosphonates: Secondary | ICD-10-CM | POA: Diagnosis not present

## 2023-04-26 DIAGNOSIS — M810 Age-related osteoporosis without current pathological fracture: Secondary | ICD-10-CM | POA: Diagnosis not present

## 2023-04-26 DIAGNOSIS — J9600 Acute respiratory failure, unspecified whether with hypoxia or hypercapnia: Secondary | ICD-10-CM | POA: Diagnosis not present

## 2023-04-26 DIAGNOSIS — F03B3 Unspecified dementia, moderate, with mood disturbance: Secondary | ICD-10-CM | POA: Diagnosis not present

## 2023-04-26 DIAGNOSIS — E538 Deficiency of other specified B group vitamins: Secondary | ICD-10-CM | POA: Diagnosis not present

## 2023-04-30 DIAGNOSIS — G309 Alzheimer's disease, unspecified: Secondary | ICD-10-CM | POA: Diagnosis not present

## 2023-04-30 DIAGNOSIS — J181 Lobar pneumonia, unspecified organism: Secondary | ICD-10-CM | POA: Diagnosis not present

## 2023-04-30 DIAGNOSIS — J9621 Acute and chronic respiratory failure with hypoxia: Secondary | ICD-10-CM | POA: Diagnosis not present

## 2023-04-30 DIAGNOSIS — J8410 Pulmonary fibrosis, unspecified: Secondary | ICD-10-CM | POA: Diagnosis not present

## 2023-05-04 DIAGNOSIS — M6281 Muscle weakness (generalized): Secondary | ICD-10-CM | POA: Diagnosis not present

## 2023-05-04 DIAGNOSIS — S72001D Fracture of unspecified part of neck of right femur, subsequent encounter for closed fracture with routine healing: Secondary | ICD-10-CM | POA: Diagnosis not present

## 2023-05-04 DIAGNOSIS — R262 Difficulty in walking, not elsewhere classified: Secondary | ICD-10-CM | POA: Diagnosis not present

## 2023-05-04 DIAGNOSIS — J8410 Pulmonary fibrosis, unspecified: Secondary | ICD-10-CM | POA: Diagnosis not present

## 2023-05-22 ENCOUNTER — Ambulatory Visit
Admission: EM | Admit: 2023-05-22 | Discharge: 2023-05-22 | Disposition: A | Discharge status: Home / Self Care | Payer: PPO

## 2023-05-22 DIAGNOSIS — X58XXXA Exposure to other specified factors, initial encounter: Secondary | ICD-10-CM | POA: Diagnosis not present

## 2023-05-22 DIAGNOSIS — Z7983 Long term (current) use of bisphosphonates: Secondary | ICD-10-CM | POA: Diagnosis not present

## 2023-05-22 DIAGNOSIS — Z96641 Presence of right artificial hip joint: Secondary | ICD-10-CM | POA: Diagnosis not present

## 2023-05-22 DIAGNOSIS — S41112A Laceration without foreign body of left upper arm, initial encounter: Secondary | ICD-10-CM

## 2023-05-22 DIAGNOSIS — S82401D Unspecified fracture of shaft of right fibula, subsequent encounter for closed fracture with routine healing: Secondary | ICD-10-CM | POA: Diagnosis not present

## 2023-05-22 DIAGNOSIS — Z885 Allergy status to narcotic agent status: Secondary | ICD-10-CM | POA: Diagnosis not present

## 2023-05-22 DIAGNOSIS — S82101A Unspecified fracture of upper end of right tibia, initial encounter for closed fracture: Secondary | ICD-10-CM | POA: Diagnosis not present

## 2023-05-22 DIAGNOSIS — S82201A Unspecified fracture of shaft of right tibia, initial encounter for closed fracture: Secondary | ICD-10-CM | POA: Diagnosis not present

## 2023-05-22 DIAGNOSIS — R609 Edema, unspecified: Secondary | ICD-10-CM | POA: Diagnosis not present

## 2023-05-22 DIAGNOSIS — M79604 Pain in right leg: Secondary | ICD-10-CM | POA: Diagnosis not present

## 2023-05-22 DIAGNOSIS — I959 Hypotension, unspecified: Secondary | ICD-10-CM | POA: Diagnosis not present

## 2023-05-22 DIAGNOSIS — M25461 Effusion, right knee: Secondary | ICD-10-CM | POA: Diagnosis not present

## 2023-05-22 DIAGNOSIS — S79101A Unspecified physeal fracture of lower end of right femur, initial encounter for closed fracture: Secondary | ICD-10-CM | POA: Diagnosis not present

## 2023-05-22 DIAGNOSIS — Z66 Do not resuscitate: Secondary | ICD-10-CM | POA: Diagnosis not present

## 2023-05-22 MED ORDER — MUPIROCIN 2 % EX OINT: 1.0000 | TOPICAL_OINTMENT | Freq: Two times a day (BID) | CUTANEOUS | 0 refills | Status: DC

## 2023-05-22 MED ORDER — CHLORHEXIDINE GLUCONATE 4 % EX SOLN: Freq: Every day | CUTANEOUS | 0 refills | Status: DC | PRN

## 2023-05-22 NOTE — Discharge Instructions (Signed)
Clean the area at least 1 time daily with Hibiclens and apply mupirocin ointment and a nonstick gauze pad.  Wrap the area and Coban to hold the dressing in place.  Elevate at rest to help with swelling.

## 2023-05-22 NOTE — ED Provider Notes (Signed)
[facility]    CSN: [id number] Arrival date & time: 05/22/23  1121      History   Chief Complaint No chief complaint on file.   HPI Tamara [name] is a 89 y.o. female.   Presenting today with daughter for evaluation of a laceration to the left forearm that occurred this morning when her arm brushed against a nail sticking out of the wall.  They have applied a pressure dressing prior to arrival which helped stop the bleeding without difficulty.  Denies decreased range of motion, numbness, tingling, uncontrolled bleeding.  Patient denies being on any anticoagulation.  Last tetanus was 12/23/2022.    Past Medical History:  Diagnosis Date   B12 deficiency 06/08/2016   Breast cancer (HCC)    Breast cancer, right breast (HCC) 04/25/2013   Cancer (HCC)    Claustrophobia    Claustrophobia    Dementia (HCC)    Hypertension    Iron deficiency anemia 12/15/2016   Malignant neoplasm of upper-outer quadrant of RIGHT female breast (HCC) 04/25/2013   Stage IA (T1b, N0, M0) invasive mucinous breast carcinoma, S/P right breast lumpectomy by Dr. Lovell Sheehan on 03/08/2013 with 1 negative sentinel node. Cancer was identified as ER+ 100%, PR+ 85%, Her2 negative, and Ki-67 marker at 17%. Oncotype Dx score of 20 with 13% 10 year risk of distant recurrence and absolute benefit of chemotherapy at 10 years in this risk category is negligible and less than 1%.   Osteoporosis     Patient Active Problem List   Diagnosis Date Noted   Acute hypoxic respiratory failure (HCC) 03/23/2023   Closed comminuted intra-articular fracture of distal femur, right, with delayed healing, subsequent encounter 11/21/2022   Closed displaced comminuted fracture of shaft of right femur (HCC) 11/21/2022   Iron deficiency 10/27/2022   Aortic atherosclerosis (HCC) 10/19/2022   Hypertension associated with type 2 diabetes mellitus (HCC) 10/19/2022   Cystitis with hematuria 10/11/2022   Postoperative fever 10/10/2022    Closed fracture of right hip (HCC) 10/08/2022   Dementia without behavioral disturbance (HCC) 10/08/2022   Fall at home, initial encounter 10/08/2022   Aortic valve stenosis, nonrheumatic 03/24/2022   Pre-diabetes 03/24/2022   Disorder of mitral valve 03/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   Essential (primary) hypertension 03/24/2022   Fibrosis lung (HCC) 03/24/2022   Acute blood loss anemia 12/15/2016   B12 deficiency 06/08/2016   Breast CA (HCC) 06/26/2014   Malignant neoplasm of upper-outer quadrant of RIGHT female breast (HCC) 04/25/2013   Osteoporosis 08/10/2007    Past Surgical History:  Procedure Laterality Date   ABDOMINAL HYSTERECTOMY     CATARACT EXTRACTION, BILATERAL     FEMUR IM NAIL Right 11/22/2022   Procedure: INTRAMEDULLARY (IM) RETROGRADE FEMORAL NAILING;  Surgeon: Oliver Barre, MD;  Location: [facility];  Service: Orthopedics;  Laterality: Right;   HIP ARTHROPLASTY Right 10/09/2022   Procedure: ARTHROPLASTY BIPOLAR HIP (HEMIARTHROPLASTY);  Surgeon: Vickki Hearing, MD;  Location: [facility];  Service: Orthopedics;  Laterality: Right;   PARTIAL MASTECTOMY WITH NEEDLE LOCALIZATION AND AXILLARY SENTINEL LYMPH NODE BX Right 03/08/2013   Procedure: PARTIAL MASTECTOMY WITH NEEDLE LOCALIZATION AND AXILLARY SENTINEL LYMPH NODE BX;  Surgeon: Dalia Heading, MD;  Location: [facility];  Service: General;  Laterality: Right;  Sentinel Node Bx @ 7:30am Needle Loc @ 8:00am    OB History   No obstetric history on file.      Home Medications    Prior to Admission medications   Medication Sig Start Date End Date  Taking? Authorizing Provider  chlorhexidine (HIBICLENS) 4 % external liquid Apply topically daily as needed. 05/22/23  Yes Particia Nearing, PA-C  mupirocin ointment (BACTROBAN) 2 % Apply 1 Application topically 2 (two) times daily. 05/22/23  Yes Particia Nearing, PA-C  acetaminophen (TYLENOL) 325 MG tablet Take 2 tablets (650 mg total) by mouth every 6 (six) hours as needed for mild pain,  moderate pain, fever or headache. 11/25/22   Johnson, Clanford L, MD  alendronate (FOSAMAX) 70 MG tablet Take 70 mg by mouth once a week. 01/05/23   [provider]  calcium-vitamin D (OSCAL WITH D) 500-200 MG-UNIT per tablet Take 2 tablets by mouth daily with breakfast. 04/25/13   Ellouise Newer, PA-C  donepezil (ARICEPT) 10 MG tablet Take 1 tablet (10 mg total) by mouth daily. Patient taking differently: Take 10 mg by mouth at bedtime. 12/11/22   Sharee Holster, NP  doxylamine, Sleep, (UNISOM) 25 MG tablet Take 25 mg by mouth at bedtime as needed for sleep.    [provider]  polyethylene glycol (MIRALAX / GLYCOLAX) 17 g packet Take 17 g by mouth daily. 03/26/23   Shon Hale, MD  risperiDONE (RISPERDAL) 0.5 MG tablet SMARTSIG:1 Tablet(s) By Mouth Every 12 Hours PRN    [provider]  sertraline (ZOLOFT) 50 MG tablet Take 50 mg by mouth daily.    [provider]    Family History Family History  Problem Relation Age of Onset   Diabetes Mother    Cancer Father    Cancer Sister    Cancer Brother     Social History Social History   Tobacco Use   Smoking status: Former    Current packs/day: 0.00    Average packs/day: 0.5 packs/day for 40.0 years (20.0 ttl pk-yrs)    Types: Cigarettes    Start date: 03/03/1927    Quit date: 03/03/1967    Years since quitting: 56.2   Smokeless tobacco: Never  Vaping Use   Vaping status: Never Used  Substance Use Topics   Alcohol use: No   Drug use: No     Allergies   Tramadol   Review of Systems Review of Systems Per HPI  Physical Exam Triage Vital Signs ED Triage Vitals [05/22/23 1151]  Encounter Vitals Group     BP 114/71     Systolic BP Percentile      Diastolic BP Percentile      Pulse Rate 77     Resp 20     Temp 98.4 F (36.9 C)     Temp Source Oral     SpO2 96 %     Weight      Height      Head Circumference      Peak Flow      Pain Score 0     Pain Loc      Pain Education       Exclude from Growth Chart    No data found.  Updated Vital Signs BP 114/71 (BP Location: Right Arm)   Pulse 77   Temp 98.4 F (36.9 C) (Oral)   Resp 20   SpO2 96%   Visual Acuity Right Eye Distance:   Left Eye Distance:   Bilateral Distance:    Right Eye Near:   Left Eye Near:    Bilateral Near:     Physical Exam Vitals and nursing note reviewed.  Constitutional:      Appearance: Normal appearance. She is not ill-appearing.  HENT:     Head: Atraumatic.     Mouth/Throat:     Mouth: Mucous membranes are moist.  Eyes:     Extraocular Movements: Extraocular movements intact.     Conjunctiva/sclera: Conjunctivae normal.  Cardiovascular:     Rate and Rhythm: Normal rate and regular rhythm.     Heart sounds: Normal heart sounds.  Pulmonary:     Effort: Pulmonary effort is normal.     Breath sounds: Normal breath sounds.  Musculoskeletal:     Cervical back: Normal range of motion and neck supple.     Comments: Range of motion at baseline  Skin:    General: Skin is warm.     Comments: Superficial linear laceration to the left forearm, well-approximated at rest, bleeding well-controlled.  No foreign body appreciable  Neurological:     Mental Status: She is alert.     Comments: At baseline  Left upper extremity neurovascularly intact  Psychiatric:        Mood and Affect: Mood normal.        Thought Content: Thought content normal.        Judgment: Judgment normal.      UC Treatments / Results  Labs (all labs ordered are listed, but only abnormal results are displayed) Labs Reviewed - No data to display  EKG   Radiology No results found.  Procedures Procedures (including critical care time)  Medications Ordered in UC Medications - No data to display  Initial Impression / Assessment and Plan / UC Course  I have reviewed the triage vital signs and the nursing notes.  Pertinent labs & imaging results that were available during my care of the patient  were reviewed by me and considered in my medical decision making (see chart for details).     Discussed closure versus pressure dressing with patient and daughter who opt for pressure dressing and good home wound care.  Wound cleaned and dressed in clinic, Hibiclens and mupirocin sent for home wound care with instructions.  Discussed return precautions.  Tetanus up-to-date.  Return for worsening symptoms.  Final Clinical Impressions(s) / UC Diagnoses   Final diagnoses:  Laceration of left upper extremity, initial encounter     Discharge Instructions      Clean the area at least 1 time daily with Hibiclens and apply mupirocin ointment and a nonstick gauze pad.  Wrap the area and Coban to hold the dressing in place.  Elevate at rest to help with swelling.    ED Prescriptions     Medication Sig Dispense Auth. Provider   chlorhexidine (HIBICLENS) 4 % external liquid Apply topically daily as needed. 236 mL Particia Nearing, PA-C   mupirocin ointment (BACTROBAN) 2 % Apply 1 Application topically 2 (two) times daily. 22 g Particia Nearing, [place]      PDMP not reviewed this encounter.   Particia Nearing, [place] 05/22/23 1302

## 2023-05-22 NOTE — ED Triage Notes (Addendum)
Per daughter, pt went into the bathroom that had a nail hanging out of the door and she cut her left forearm near her elbow earlier today.   Last tetanus 12/23/2022

## 2023-05-23 ENCOUNTER — Inpatient Hospital Stay (HOSPITAL_COMMUNITY)
Admission: RE | Admit: 2023-05-23 | Discharge: 2023-05-27 | DRG: 494 | Disposition: A | Discharge status: Skilled Nursing Facility | Payer: PPO | Source: Other Acute Inpatient Hospital | Attending: Family Medicine | Admitting: Family Medicine

## 2023-05-23 ENCOUNTER — Encounter (HOSPITAL_COMMUNITY): Payer: Self-pay

## 2023-05-23 DIAGNOSIS — X500XXA Overexertion from strenuous movement or load, initial encounter: Secondary | ICD-10-CM | POA: Diagnosis not present

## 2023-05-23 DIAGNOSIS — Z833 Family history of diabetes mellitus: Secondary | ICD-10-CM | POA: Diagnosis not present

## 2023-05-23 DIAGNOSIS — Z885 Allergy status to narcotic agent status: Secondary | ICD-10-CM

## 2023-05-23 DIAGNOSIS — F0390 Unspecified dementia without behavioral disturbance: Secondary | ICD-10-CM | POA: Diagnosis not present

## 2023-05-23 DIAGNOSIS — I10 Essential (primary) hypertension: Secondary | ICD-10-CM | POA: Diagnosis not present

## 2023-05-23 DIAGNOSIS — E119 Type 2 diabetes mellitus without complications: Secondary | ICD-10-CM | POA: Diagnosis not present

## 2023-05-23 DIAGNOSIS — Z8701 Personal history of pneumonia (recurrent): Secondary | ICD-10-CM | POA: Diagnosis not present

## 2023-05-23 DIAGNOSIS — U071 COVID-19: Secondary | ICD-10-CM | POA: Diagnosis not present

## 2023-05-23 DIAGNOSIS — Z87891 Personal history of nicotine dependence: Secondary | ICD-10-CM | POA: Diagnosis not present

## 2023-05-23 DIAGNOSIS — F0150 Vascular dementia without behavioral disturbance: Secondary | ICD-10-CM | POA: Diagnosis not present

## 2023-05-23 DIAGNOSIS — Z79899 Other long term (current) drug therapy: Secondary | ICD-10-CM | POA: Diagnosis not present

## 2023-05-23 DIAGNOSIS — E611 Iron deficiency: Secondary | ICD-10-CM | POA: Diagnosis not present

## 2023-05-23 DIAGNOSIS — M810 Age-related osteoporosis without current pathological fracture: Secondary | ICD-10-CM | POA: Diagnosis not present

## 2023-05-23 DIAGNOSIS — S82111A Displaced fracture of right tibial spine, initial encounter for closed fracture: Secondary | ICD-10-CM | POA: Diagnosis not present

## 2023-05-23 DIAGNOSIS — Z96641 Presence of right artificial hip joint: Secondary | ICD-10-CM | POA: Diagnosis not present

## 2023-05-23 DIAGNOSIS — S41112A Laceration without foreign body of left upper arm, initial encounter: Secondary | ICD-10-CM | POA: Diagnosis not present

## 2023-05-23 DIAGNOSIS — Z66 Do not resuscitate: Secondary | ICD-10-CM | POA: Diagnosis not present

## 2023-05-23 DIAGNOSIS — D631 Anemia in chronic kidney disease: Secondary | ICD-10-CM | POA: Diagnosis not present

## 2023-05-23 DIAGNOSIS — S82101D Unspecified fracture of upper end of right tibia, subsequent encounter for closed fracture with routine healing: Secondary | ICD-10-CM

## 2023-05-23 DIAGNOSIS — I350 Nonrheumatic aortic (valve) stenosis: Secondary | ICD-10-CM | POA: Diagnosis not present

## 2023-05-23 DIAGNOSIS — F32A Depression, unspecified: Secondary | ICD-10-CM | POA: Diagnosis not present

## 2023-05-23 DIAGNOSIS — E538 Deficiency of other specified B group vitamins: Secondary | ICD-10-CM | POA: Diagnosis not present

## 2023-05-23 DIAGNOSIS — Z853 Personal history of malignant neoplasm of breast: Secondary | ICD-10-CM | POA: Diagnosis not present

## 2023-05-23 DIAGNOSIS — M62561 Muscle wasting and atrophy, not elsewhere classified, right lower leg: Secondary | ICD-10-CM | POA: Diagnosis not present

## 2023-05-23 DIAGNOSIS — S51012A Laceration without foreign body of left elbow, initial encounter: Secondary | ICD-10-CM | POA: Diagnosis not present

## 2023-05-23 DIAGNOSIS — Z7983 Long term (current) use of bisphosphonates: Secondary | ICD-10-CM | POA: Diagnosis not present

## 2023-05-23 DIAGNOSIS — D638 Anemia in other chronic diseases classified elsewhere: Secondary | ICD-10-CM | POA: Diagnosis present

## 2023-05-23 DIAGNOSIS — N390 Urinary tract infection, site not specified: Secondary | ICD-10-CM | POA: Diagnosis not present

## 2023-05-23 DIAGNOSIS — S82101A Unspecified fracture of upper end of right tibia, initial encounter for closed fracture: Secondary | ICD-10-CM | POA: Diagnosis not present

## 2023-05-23 DIAGNOSIS — S51812A Laceration without foreign body of left forearm, initial encounter: Secondary | ICD-10-CM | POA: Diagnosis present

## 2023-05-23 DIAGNOSIS — S41112D Laceration without foreign body of left upper arm, subsequent encounter: Secondary | ICD-10-CM | POA: Diagnosis not present

## 2023-05-23 DIAGNOSIS — W228XXA Striking against or struck by other objects, initial encounter: Secondary | ICD-10-CM | POA: Diagnosis present

## 2023-05-23 DIAGNOSIS — Z90710 Acquired absence of both cervix and uterus: Secondary | ICD-10-CM | POA: Diagnosis not present

## 2023-05-23 DIAGNOSIS — E1169 Type 2 diabetes mellitus with other specified complication: Secondary | ICD-10-CM | POA: Diagnosis not present

## 2023-05-23 DIAGNOSIS — W450XXA Nail entering through skin, initial encounter: Secondary | ICD-10-CM | POA: Diagnosis not present

## 2023-05-23 DIAGNOSIS — R531 Weakness: Secondary | ICD-10-CM | POA: Diagnosis not present

## 2023-05-23 DIAGNOSIS — Z8639 Personal history of other endocrine, nutritional and metabolic disease: Secondary | ICD-10-CM

## 2023-05-23 DIAGNOSIS — C50911 Malignant neoplasm of unspecified site of right female breast: Secondary | ICD-10-CM | POA: Diagnosis not present

## 2023-05-23 DIAGNOSIS — S82101G Unspecified fracture of upper end of right tibia, subsequent encounter for closed fracture with delayed healing: Secondary | ICD-10-CM | POA: Diagnosis not present

## 2023-05-23 DIAGNOSIS — R2689 Other abnormalities of gait and mobility: Secondary | ICD-10-CM | POA: Diagnosis not present

## 2023-05-23 DIAGNOSIS — Z9181 History of falling: Secondary | ICD-10-CM | POA: Diagnosis not present

## 2023-05-23 DIAGNOSIS — M25561 Pain in right knee: Secondary | ICD-10-CM | POA: Diagnosis present

## 2023-05-23 LAB — COMPREHENSIVE METABOLIC PANEL
ALT: 17 U/L (ref 0–44)
AST: 16 U/L (ref 15–41)
Albumin: 3.1 g/dL — ABNORMAL LOW (ref 3.5–5.0)
Alkaline Phosphatase: 66 U/L (ref 38–126)
Anion gap: 8 (ref 5–15)
BUN: 35 mg/dL — ABNORMAL HIGH (ref 8–23)
CO2: 21 mmol/L — ABNORMAL LOW (ref 22–32)
Calcium: 7.9 mg/dL — ABNORMAL LOW (ref 8.9–10.3)
Chloride: 112 mmol/L — ABNORMAL HIGH (ref 98–111)
Creatinine, Ser: 1.03 mg/dL — ABNORMAL HIGH (ref 0.44–1.00)
GFR, Estimated: 53 mL/min — ABNORMAL LOW (ref >60)
Glucose, Bld: 142 mg/dL — ABNORMAL HIGH (ref 70–99)
Potassium: 3.8 mmol/L (ref 3.5–5.1)
Sodium: 141 mmol/L (ref 135–145)
Total Bilirubin: 1 mg/dL (ref 0.3–1.2)
Total Protein: 6.3 g/dL — ABNORMAL LOW (ref 6.5–8.1)

## 2023-05-23 LAB — CBC
HCT: 28.4 % — ABNORMAL LOW (ref 36.0–46.0)
Hemoglobin: 9.3 g/dL — ABNORMAL LOW (ref 12.0–15.0)
MCH: 31.1 pg (ref 26.0–34.0)
MCHC: 32.7 g/dL (ref 30.0–36.0)
MCV: 95 fL (ref 80.0–100.0)
Platelets: 178 10*3/uL (ref 150–400)
RBC: 2.99 MIL/uL — ABNORMAL LOW (ref 3.87–5.11)
RDW: 14.4 % (ref 11.5–15.5)
WBC: 7.8 10*3/uL (ref 4.0–10.5)
nRBC: 0 % (ref 0.0–0.2)

## 2023-05-23 LAB — MAGNESIUM: Magnesium: 2.1 mg/dL (ref 1.7–2.4)

## 2023-05-23 LAB — PHOSPHORUS: Phosphorus: 3.7 mg/dL (ref 2.5–4.6)

## 2023-05-23 MED ORDER — ONDANSETRON HCL 4 MG PO TABS: 4.0000 mg | ORAL_TABLET | Freq: Four times a day (QID) | ORAL | Status: DC | PRN

## 2023-05-23 MED ORDER — CEFAZOLIN SODIUM-DEXTROSE 2-4 GM/100ML-% IV SOLN
2.0000 g | INTRAVENOUS | Status: AC
  Administered 2023-05-24: 2 g via INTRAVENOUS
  Filled 2023-05-23: qty 100

## 2023-05-23 MED ORDER — ACETAMINOPHEN 650 MG RE SUPP: 650.0000 mg | Freq: Four times a day (QID) | RECTAL | Status: DC | PRN

## 2023-05-23 MED ORDER — ACETAMINOPHEN 325 MG PO TABS
650.0000 mg | ORAL_TABLET | Freq: Four times a day (QID) | ORAL | Status: DC | PRN
  Administered 2023-05-24: 650 mg via ORAL
  Filled 2023-05-23: qty 2

## 2023-05-23 MED ORDER — ONDANSETRON HCL 4 MG/2ML IJ SOLN
4.0000 mg | Freq: Four times a day (QID) | INTRAMUSCULAR | Status: DC | PRN
  Administered 2023-05-24: 4 mg via INTRAVENOUS

## 2023-05-23 NOTE — Hospital Course (Addendum)
89 year old female with a history of dementia, diabetes mellitus, hypertension, right breast cancer, and B12 deficiency, Fe deficiency, aortic valve stenosis presented as a transfer from [facility] secondary to right knee pain and swelling.  Per report, patient sustained a hyperflexion injury to the right knee when her leg came out of the wheelchair while daughter was pushing her up the ramp.  She noted swelling of the right knee as well as the right upper tibia-fibula area.  The patient was taken to [facility] emergency department.  X-ray showed an acute right proximal tibial metaphyseal fracture.  Orthopedics, Dr. Yevette Edwards was consulted.  He recommended transfer the patient to [facility].  Notably, the patient had a recent hospital admission from 03/23/2023 to 03/26/2023 secondary to acute respiratory failure from pneumonia.  The patient was treated with ceftriaxone and azithromycin.  She was discharged home with cephalexin and azithromycin and prednisone.  She was discharged to [facility] health care SNF.  She was weaned off oxygen at the time of discharge.  She had a prior hospital mission from 11/21/2022 to 11/25/2022 when she was treated for right distal femur fracture.  A supracondylar nail was placed by Dr. Dallas Schimke on 11/22/2022.  She was subsequently discharged to skilled nursing facility.

## 2023-05-23 NOTE — Progress Notes (Addendum)
PROGRESS NOTE  Tamara [name]:[id number] DOB: 05/19/1934 DOA: 05/23/2023 PCP: [name], MD  Brief History:   89 year old female with a history of dementia, diabetes mellitus, hypertension, right breast cancer, and B12 deficiency, Fe deficiency, aortic valve stenosis presented as a transfer from [facility] secondary to right knee pain and swelling.  Per report, patient sustained a hyperflexion injury to the right knee when her leg came out of the wheelchair while daughter was pushing her up the ramp.  She noted swelling of the right knee as well as the right upper tibia-fibula area.  The patient was taken to [facility] emergency department.  X-ray showed an acute right proximal tibial metaphyseal fracture.  Orthopedics, Dr. Yevette Edwards was consulted.  He recommended transfer the patient to [facility].  Notably, the patient had a recent hospital admission from 03/23/2023 to 03/26/2023 secondary to acute respiratory failure from pneumonia.  The patient was treated with ceftriaxone and azithromycin.  She was discharged home with cephalexin and azithromycin and prednisone.  She was discharged to [facility] health care SNF.  She was weaned off oxygen at the time of discharge.  She had a prior hospital mission from 11/21/2022 to 11/25/2022 when she was treated for right distal femur fracture.  A supracondylar nail was placed by Dr. Dallas Schimke on 11/22/2022.  She was subsequently discharged to skilled nursing facility.   Assessment/Plan:  Closed fracture of right proximal tibia -Orthopedics, Dr. Dallas Schimke consulted>>will see patient -Judicious opioids  Dementia without behavioral disturbance -Continue Aricept -pt had hospital delirium during last 2 hospitalizations -Able to answer simple questions, and for the most part recognizes family    Essential hypertension -amlodipine and losartan discontinued since her 11/2022 admission -BP soft>>improved, remains well controlled off anti-HTN meds   Diabetes  mellitus type 2, controlled -Not on any medications as an outpatient -10/08/22 hemoglobin A1c--5.9   Right breast cancer -Currently in remission -Status post lumpectomy and lymph node dissection August 2014 -Patient declined radiation therapy -s/p Aromasin October 2014, continued till October 2019.  -Follow-up Dr. Ellin Saba  Anemia chronic disease -baseline Hgb ~10        Family Communication:   no Family at bedside  Consultants:  ortho  Code Status:  FULL   DVT Prophylaxis:  holding in preparation for surgery   Procedures: As Listed in Progress Note Above  Antibiotics: None    Total time spent 50 minutes.  Greater than 50% spent face to face counseling and coordinating care.   Subjective: Pt is pleasantly confused.  Complains of right leg pain.  Denies f/c, cp, sob, n/v/d  Objective: Vitals:   05/23/23 0234 05/23/23 0546  BP: 109/69 (!) 109/58  Pulse: 88 91  Resp: 18   Temp: (!) 97.5 F (36.4 C) 98 F (36.7 C)  TempSrc: Oral Oral  SpO2: 96% 98%  Weight: 64.4 kg    No intake or output data in the 24 hours ending 05/23/23 0846 Weight change:  Exam:  General:  Pt is alert, follows commands appropriately, not in acute distress HEENT: No icterus, No thrush, No neck mass, [place]/AT Cardiovascular: RRR, S1/S2, no rubs, no gallops Respiratory: bibasilar crackles. No wheeze Abdomen: Soft/+BS, non tender, non distended, no guarding Extremities: No edema, No lymphangitis, No petechiae, No rashes, no synovitis   Data Reviewed: I have personally reviewed following labs and imaging studies Basic Metabolic Panel: Recent Labs  Lab 05/23/23 0635  NA 141  K 3.8  CL 112*  CO2 21*  GLUCOSE 142*  BUN 35*  CREATININE 1.03*  CALCIUM 7.9*  MG 2.1  PHOS 3.7   Liver Function Tests: Recent Labs  Lab 05/23/23 0635  AST 16  ALT 17  ALKPHOS 66  BILITOT 1.0  PROT 6.3*  ALBUMIN 3.1*   No results for input(s): "LIPASE", "AMYLASE" in the last 168 hours. No  results for input(s): "AMMONIA" in the last 168 hours. Coagulation Profile: No results for input(s): "INR", "PROTIME" in the last 168 hours. CBC: Recent Labs  Lab 05/23/23 0635  WBC 7.8  HGB 9.3*  HCT 28.4*  MCV 95.0  PLT 178   Cardiac Enzymes: No results for input(s): "CKTOTAL", "CKMB", "CKMBINDEX", "TROPONINI" in the last 168 hours. BNP: Invalid input(s): "POCBNP" CBG: No results for input(s): "GLUCAP" in the last 168 hours. HbA1C: No results for input(s): "HGBA1C" in the last 72 hours. Urine analysis:    Component Value Date/Time   COLORURINE YELLOW 11/22/2022 1200   APPEARANCEUR TURBID (A) 11/22/2022 1200   LABSPEC 1.015 11/22/2022 1200   PHURINE 5.0 11/22/2022 1200   GLUCOSEU NEGATIVE 11/22/2022 1200   HGBUR SMALL (A) 11/22/2022 1200   BILIRUBINUR NEGATIVE 11/22/2022 1200   KETONESUR NEGATIVE 11/22/2022 1200   PROTEINUR 100 (A) 11/22/2022 1200   NITRITE NEGATIVE 11/22/2022 1200   LEUKOCYTESUR SMALL (A) 11/22/2022 1200   Sepsis Labs: @LABRCNTIP (procalcitonin:4,lacticidven:4) )No results found for this or any previous visit (from the past 240 hour(s)).   Scheduled Meds: Continuous Infusions:  Procedures/Studies: No results found.  Catarina Hartshorn, DO  [facility]  If 7PM-7AM, please contact night-coverage [email or web address] Password [email or web address] 05/23/2023, 8:46 AM   LOS: 0 days

## 2023-05-23 NOTE — H&P (Signed)
History and Physical    Patient: Tamara [name] GNF:[id number] DOB: 03-Dec-1933 DOA: 05/23/2023 DOS: the patient was seen and examined on 05/23/2023 PCP: [name], MD  Patient coming from: Home  Chief Complaint: No chief complaint on file.  HPI: Tamara [name] is a 89 y.o. female with medical history significant of hypertension, type 2 diabetes mellitus, dementia, right breast cancer, aortic valve stenosis, lung fibrosis presents to [facility] ED due to right knee pain and swelling.  Patient was unable to provide history possibly due to underlying dementia, history was obtained from ED medical record.  Per report, patient sustained a hyperflexion injury to the right knee when the leg came on the wheelchair while daughter was pushing her up the ramp.  She noted swelling of the right knee as well as the right upper tibia-fibula area.  There was no report of chest pain, shortness of breath, fever, chills, nausea or vomiting.  Of note, earlier in the day, patient presents to [facility] [place] urgent care for evaluation of left forearm laceration which occurred when accidentally brushed against a nail sticking out of the wall.  Pressure dressing was applied prior to arrival to the urgent care with bleeding being controlled and no numbness, tingling or decreased range of motion.  Pressure dressing was applied at the urgent care due to patient's preference, Hibiclens and mupirocin ordered for home wound care with instructions.  Patient was admitted from 9/3 to 03/26/2023 due to community acquired pneumonia of left lower lobe of lung and acute hypoxic respiratory failure and was treated with IV Rocephin and azithromycin, she was weaned off supplemental oxygen and discharged home on Keflex, azithromycin and prednisone.  She was also admitted from 5//24 to 11/25/2022 due to right distal femur fracture repaired by Dr. Dallas Schimke on 11/22/2022 with supracondylar nail placement and she was discharged to SNF at that time for  rehabilitation.  ED Course:  In the emergency department, BP was 129/72, pulse 83 bpm, respiratory 16/min, O2 sat 98% on room air.  Workup in the ED showed WBC of 11.2, hemoglobin 10.0, hematocrit 31.2, MCV 95.7, platelets 202.  BMP showed sodium 149, potassium 4.2, chloride 113, bicarb 28.2, BUN 32, creatinine 1.37, EGFR 38, albumin 2.9 Right knee x-ray showed acute right proximal tibia metaphyseal fracture Right tibia- fibula x-ray showed right proximal tibia metaphyseal fracture. Patient was treated with Tylenol. Orthopedic surgeon on-call at [facility] (Dr. Yevette Edwards) was consulted and accepted patient to be admitted to [facility].  However, it appears that later on family insisted for patient to be admitted to [facility] due to proximity and patient was transferred to [facility] (instead of [facility]).  Review of Systems: Review of systems as noted in the HPI. All other systems reviewed and are negative.   Past Medical History:  Diagnosis Date   B12 deficiency 06/08/2016   Breast cancer (HCC)    Breast cancer, right breast (HCC) 04/25/2013   Cancer (HCC)    Claustrophobia    Claustrophobia    Dementia (HCC)    Hypertension    Iron deficiency anemia 12/15/2016   Malignant neoplasm of upper-outer quadrant of RIGHT female breast (HCC) 04/25/2013   Stage IA (T1b, N0, M0) invasive mucinous breast carcinoma, S/P right breast lumpectomy by Dr. Lovell Sheehan on 03/08/2013 with 1 negative sentinel node. Cancer was identified as ER+ 100%, PR+ 85%, Her2 negative, and Ki-67 marker at 17%. Oncotype Dx score of 20 with 13% 10 year risk of distant recurrence and absolute benefit of chemotherapy at  10 years in this risk category is negligible and less than 1%.   Osteoporosis    Past Surgical History:  Procedure Laterality Date   ABDOMINAL HYSTERECTOMY     CATARACT EXTRACTION, BILATERAL     FEMUR IM NAIL Right 11/22/2022   Procedure: INTRAMEDULLARY (IM) RETROGRADE FEMORAL NAILING;  Surgeon: Oliver Barre, MD;  Location: [facility];   Service: Orthopedics;  Laterality: Right;   HIP ARTHROPLASTY Right 10/09/2022   Procedure: ARTHROPLASTY BIPOLAR HIP (HEMIARTHROPLASTY);  Surgeon: Vickki Hearing, MD;  Location: [facility];  Service: Orthopedics;  Laterality: Right;   PARTIAL MASTECTOMY WITH NEEDLE LOCALIZATION AND AXILLARY SENTINEL LYMPH NODE BX Right 03/08/2013   Procedure: PARTIAL MASTECTOMY WITH NEEDLE LOCALIZATION AND AXILLARY SENTINEL LYMPH NODE BX;  Surgeon: Dalia Heading, MD;  Location: [facility];  Service: General;  Laterality: Right;  Sentinel Node Bx @ 7:30am Needle Loc @ 8:00am    Social History:  reports that she quit smoking about 56 years ago. Her smoking use included cigarettes. She started smoking about 96 years ago. She has a 20 pack-year smoking history. She has never used smokeless tobacco. She reports that she does not drink alcohol and does not use drugs.   Allergies  Allergen Reactions   Tramadol Other (See Comments)    Made her feel "loopy", excessive scratching    Family History  Problem Relation Age of Onset   Diabetes Mother    Cancer Father    Cancer Sister    Cancer Brother      Prior to Admission medications   Medication Sig Start Date End Date Taking? Authorizing Provider  acetaminophen (TYLENOL) 325 MG tablet Take 2 tablets (650 mg total) by mouth every 6 (six) hours as needed for mild pain, moderate pain, fever or headache. 11/25/22   Johnson, Clanford L, MD  alendronate (FOSAMAX) 70 MG tablet Take 70 mg by mouth once a week. 01/05/23   [provider]  calcium-vitamin D (OSCAL WITH D) 500-200 MG-UNIT per tablet Take 2 tablets by mouth daily with breakfast. 04/25/13   Ellouise Newer, PA-C  chlorhexidine (HIBICLENS) 4 % external liquid Apply topically daily as needed. 05/22/23   Particia Nearing, PA-C  donepezil (ARICEPT) 10 MG tablet Take 1 tablet (10 mg total) by mouth daily. Patient taking differently: Take 10 mg by mouth at bedtime. 12/11/22   Sharee Holster, NP   doxylamine, Sleep, (UNISOM) 25 MG tablet Take 25 mg by mouth at bedtime as needed for sleep.    [provider]  mupirocin ointment (BACTROBAN) 2 % Apply 1 Application topically 2 (two) times daily. 05/22/23   Particia Nearing, PA-C  polyethylene glycol (MIRALAX / GLYCOLAX) 17 g packet Take 17 g by mouth daily. 03/26/23   Shon Hale, MD  risperiDONE (RISPERDAL) 0.5 MG tablet SMARTSIG:1 Tablet(s) By Mouth Every 12 Hours PRN    [provider]  sertraline (ZOLOFT) 50 MG tablet Take 50 mg by mouth daily.    [provider]    Physical Exam: BP (!) 109/58 (BP Location: Left Arm)   Pulse 91   Temp 98 F (36.7 C) (Oral)   Resp 18   Wt 64.4 kg   SpO2 98%   BMI 25.97 kg/m   General: 89 y.o. year-old female well developed well nourished in no acute distress.   HEENT: NCAT, EOMI Neck: Supple, trachea medial Cardiovascular: Regular rate and rhythm with no rubs or gallops.  No thyromegaly or JVD noted.  No lower extremity  edema. 2/4 pulses in all 4 extremities. Respiratory: Clear to auscultation with no wheezes or rales. Good inspiratory effort. Abdomen: Soft, nontender nondistended with normal bowel sounds x4 quadrants. Muskuloskeletal: Right knee swelling with mild erythema of the upper tibia fibular region as well as the right anterior knee.   Neuro: No focal neurologic deficit.  Sensation, reflexes intact Skin: No ulcerative lesions noted or rashes Psychiatry: Mood is appropriate for condition and setting          Labs on Admission:  Basic Metabolic Panel: Recent Labs  Lab 05/23/23 0635  NA 141  K 3.8  CL 112*  CO2 21*  GLUCOSE 142*  BUN 35*  CREATININE 1.03*  CALCIUM 7.9*  MG 2.1  PHOS 3.7   Liver Function Tests: Recent Labs  Lab 05/23/23 0635  AST 16  ALT 17  ALKPHOS 66  BILITOT 1.0  PROT 6.3*  ALBUMIN 3.1*   No results for input(s): "LIPASE", "AMYLASE" in the last 168 hours. No results for input(s): "AMMONIA" in the last 168  hours. CBC: Recent Labs  Lab 05/23/23 0635  WBC 7.8  HGB 9.3*  HCT 28.4*  MCV 95.0  PLT 178   Cardiac Enzymes: No results for input(s): "CKTOTAL", "CKMB", "CKMBINDEX", "TROPONINI" in the last 168 hours.  BNP (last 3 results) Recent Labs    10/11/22 1430  BNP 237.0*    ProBNP (last 3 results) No results for input(s): "PROBNP" in the last 8760 hours.  CBG: No results for input(s): "GLUCAP" in the last 168 hours.  Radiological Exams on Admission: No results found.  EKG: I independently viewed the EKG done and my findings are as followed: EKG was not done in the ED  Assessment/Plan Present on Admission:  Closed fracture of right proximal tibia  Essential (primary) hypertension  Dementia without behavioral disturbance (HCC)  Principal Problem:   Closed fracture of right proximal tibia Active Problems:   Essential (primary) hypertension   Dementia without behavioral disturbance (HCC)   Laceration of left upper extremity   History of type 2 diabetes mellitus   Closed fracture of right proximal tibia Patient was transferred to [facility] from [facility] per family preference.  Patient was supposed to be transferred to [facility] where she was already accepted by the orthopedic team. I already spoke with Dr. Dallas Schimke who will determine if he will be able to accept the patient, otherwise, patient may have to be transferred to [facility] for further orthopedic management. Continue fall precaution Keep patient n.p.o.  Laceration of left upper extremity Continue wound care  Essential hypertension (controlled) Not on home medications  History of type 2 diabetes mellitus Hgb A1c 5.9 on 10/08/2022 Patient is not on any medication Continue diet and lifestyle modification  Dementia Continue home meds when med rec is updated  DVT prophylaxis: SCDs  Advance Care Planning:   Code Status: Full Code   Consults: Orthopedic surgery  Family Communication: None at  bedside  Severity of Illness: The appropriate patient status for this patient is INPATIENT. Inpatient status is judged to be reasonable and necessary in order to provide the required intensity of service to ensure the patient's safety. The patient's presenting symptoms, physical exam findings, and initial radiographic and laboratory data in the context of their chronic comorbidities is felt to place them at high risk for further clinical deterioration. Furthermore, it is not anticipated that the patient will be medically stable for discharge from the hospital within 2 midnights of admission.   * I  certify that at the point of admission it is my clinical judgment that the patient will require inpatient hospital care spanning beyond 2 midnights from the point of admission due to high intensity of service, high risk for further deterioration and high frequency of surveillance required.*  Author: Frankey Shown, DO 05/23/2023 7:37 AM  For on call review [email or web address].

## 2023-05-23 NOTE — Consult Note (Signed)
ORTHOPAEDIC CONSULTATION  REQUESTING PHYSICIAN: TatOnalee Hua, MD  ASSESSMENT AND PLAN: 89 y.o. female with the following: Right proximal tibial fracture, metadiaphyseal; possible extension to plateau  Orthopedics recommends admission to a medical service and we will provide consultation and follow along  - Weight Bearing Status/Activity: NWB RLE   - Additional recommended labs/tests: None  -VTE Prophylaxis: At discretion of hospitalist  - Pain control: As needed; currently comfortable  - Follow-up plan: TBD  -Procedures: Continue to discuss operative vs nonoperative management.  After discussing both options, family would like to proceed with ORIF.  Case has been posted for 05/24/23, pending clearance and swelling conducive to surgery.   Chief Complaint: Right leg pain  HPI: Tamara [name] is a 89 y.o. female with PMH as listed below.  She is well known to myself and my partner.  She underwent a right hip bipolar in March, 2024 and then a retrograde femoral nail by myself in May.  She healed following both surgeries, but she has not been active.  She will use a walker to assist with ambulation, and a wheelchair at times.  Last evening, she was been pushed in a wheelchair when her right ankle got caught under the chair as she was being pushed.  She has immediate pain.  Family noted swelling.  The presented to [facility] for evaluation and transferred to [facility] for further treatment.  She was recently admitted and discharged following treatment for pneumonia in September.  She has dementia.  She is currently not complaining of pain.  Her daughter is at the bedside.   Past Medical History:  Diagnosis Date   B12 deficiency 06/08/2016   Breast cancer (HCC)    Breast cancer, right breast (HCC) 04/25/2013   Cancer (HCC)    Claustrophobia    Claustrophobia    Dementia (HCC)    Hypertension    Iron deficiency anemia 12/15/2016   Malignant neoplasm of upper-outer quadrant of RIGHT  female breast (HCC) 04/25/2013   Stage IA (T1b, N0, M0) invasive mucinous breast carcinoma, S/P right breast lumpectomy by Dr. Lovell Sheehan on 03/08/2013 with 1 negative sentinel node. Cancer was identified as ER+ 100%, PR+ 85%, Her2 negative, and Ki-67 marker at 17%. Oncotype Dx score of 20 with 13% 10 year risk of distant recurrence and absolute benefit of chemotherapy at 10 years in this risk category is negligible and less than 1%.   Osteoporosis    Past Surgical History:  Procedure Laterality Date   ABDOMINAL HYSTERECTOMY     CATARACT EXTRACTION, BILATERAL     FEMUR IM NAIL Right 11/22/2022   Procedure: INTRAMEDULLARY (IM) RETROGRADE FEMORAL NAILING;  Surgeon: Oliver Barre, MD;  Location: [facility];  Service: Orthopedics;  Laterality: Right;   HIP ARTHROPLASTY Right 10/09/2022   Procedure: ARTHROPLASTY BIPOLAR HIP (HEMIARTHROPLASTY);  Surgeon: Vickki Hearing, MD;  Location: [facility];  Service: Orthopedics;  Laterality: Right;   PARTIAL MASTECTOMY WITH NEEDLE LOCALIZATION AND AXILLARY SENTINEL LYMPH NODE BX Right 03/08/2013   Procedure: PARTIAL MASTECTOMY WITH NEEDLE LOCALIZATION AND AXILLARY SENTINEL LYMPH NODE BX;  Surgeon: Dalia Heading, MD;  Location: [facility] ORS;  Service: General;  Laterality: Right;  Sentinel Node Bx @ 7:30am Needle Loc @ 8:00am   Social History   Socioeconomic History   Marital status: Widowed    Spouse name: Not on file   Number of children: Not on file   Years of education: Not on file   Highest education level: Not on file  Occupational  History   Not on file  Tobacco Use   Smoking status: Former    Current packs/day: 0.00    Average packs/day: 0.5 packs/day for 40.0 years (20.0 ttl pk-yrs)    Types: Cigarettes    Start date: 03/03/1927    Quit date: 03/03/1967    Years since quitting: 56.2   Smokeless tobacco: Never  Vaping Use   Vaping status: Never Used  Substance and Sexual Activity   Alcohol use: No   Drug use: No   Sexual activity: Yes    Birth  control/protection: Surgical  Other Topics Concern   Not on file  Social History Narrative   Not on file   Social Determinants of Health   Financial Resource Strain: Not on file  Food Insecurity: No Food Insecurity (03/23/2023)   Hunger Vital Sign    Worried About Running Out of Food in the Last Year: Never true    Ran Out of Food in the Last Year: Never true  Transportation Needs: No Transportation Needs (04/02/2023)   Received from [facility]   PRAPARE - Transportation    Lack of Transportation (Medical): No    Lack of Transportation (Non-Medical): No  Physical Activity: Not on file  Stress: Not on file  Social Connections: Not on file   Family History  Problem Relation Age of Onset   Diabetes Mother    Cancer Father    Cancer Sister    Cancer Brother    Allergies  Allergen Reactions   Tramadol Other (See Comments)    Made her feel "loopy", excessive scratching   Prior to Admission medications   Medication Sig Start Date End Date Taking? Authorizing Provider  acetaminophen (TYLENOL) 325 MG tablet Take 2 tablets (650 mg total) by mouth every 6 (six) hours as needed for mild pain, moderate pain, fever or headache. 11/25/22  Yes Johnson, Clanford L, MD  alendronate (FOSAMAX) 70 MG tablet Take 70 mg by mouth once a week. 01/05/23  Yes [provider]  calcium-vitamin D (OSCAL WITH D) 500-200 MG-UNIT per tablet Take 2 tablets by mouth daily with breakfast. 04/25/13  Yes Kefalas, Maurine Minister, PA-C  chlorhexidine (HIBICLENS) 4 % external liquid Apply topically daily as needed. 05/22/23  Yes Particia Nearing, PA-C  donepezil (ARICEPT) 10 MG tablet Take 1 tablet (10 mg total) by mouth daily. Patient taking differently: Take 10 mg by mouth at bedtime. 12/11/22  Yes Sharee Holster, NP  doxylamine, Sleep, (UNISOM) 25 MG tablet Take 25 mg by mouth at bedtime as needed for sleep.   Yes [provider]  mupirocin ointment (BACTROBAN) 2 % Apply 1 Application topically  2 (two) times daily. 05/22/23  Yes Particia Nearing, PA-C  polyethylene glycol (MIRALAX / GLYCOLAX) 17 g packet Take 17 g by mouth daily. 03/26/23  Yes Emokpae, Courage, MD  risperiDONE (RISPERDAL) 0.5 MG tablet SMARTSIG:1 Tablet(s) By Mouth Every 12 Hours PRN   Yes [provider]  sertraline (ZOLOFT) 50 MG tablet Take 50 mg by mouth daily.   Yes [provider]    Family History Reviewed and non-contributory, no pertinent history of problems with bleeding or anesthesia    Review of Systems Unable to assess    OBJECTIVE  Vitals:Patient Vitals for the past 8 hrs:  BP Temp Temp src Pulse Resp SpO2  05/23/23 1300 (!) 99/58 98.5 F (36.9 C) Oral 92 18 95 %   General: Comfortable.  Does not answer questions appropriately.  Cardiovascular: Warm extremities  noted Respiratory: No cyanosis, no use of accessory musculature GI: No organomegaly, abdomen is soft and non-tender Skin: No lesions in the area of chief complaint other than those listed below in MSK exam.  Neurologic: Responds to light touch distally Psychiatric: Patient cannot be consented for procedures Lymphatic: No swelling obvious and reported other than the area involved in the exam below Extremities   RLE:  Surgical incisions about the knee are healed, no surrounding erythema or drainage. Ecchymosis and swelling.  Pain with motion of the right knee.  Active motion intact to foot and great toe.  Toes are warm and well perfused.   Test Results Imaging  XR from [facility] demonstrates well positioned right hip hemiarthroplasty.  Distal femur fracture is healed.  No change in alignment of hardware.  Minimally displaced fracture of the right proximal tibia in meta-diaphyseal region.  Possible extension to lateral plateau.  No obvious depression.  Some small fragments at lateral border of lateral tibial plateau  Labs cbc Recent Labs    05/23/23 0635  WBC 7.8  HGB 9.3*  HCT 28.4*  PLT 178     Recent  Labs    05/23/23 0635  NA 141  K 3.8  CL 112*  CO2 21*  GLUCOSE 142*  BUN 35*  CREATININE 1.03*  CALCIUM 7.9*

## 2023-05-24 ENCOUNTER — Other Ambulatory Visit: Payer: Self-pay

## 2023-05-24 ENCOUNTER — Inpatient Hospital Stay (HOSPITAL_COMMUNITY): Payer: PPO

## 2023-05-24 ENCOUNTER — Inpatient Hospital Stay (HOSPITAL_COMMUNITY): Payer: PPO | Admitting: Anesthesiology

## 2023-05-24 ENCOUNTER — Encounter (HOSPITAL_COMMUNITY)
Admission: RE | Disposition: A | Discharge status: Skilled Nursing Facility | Payer: Self-pay | Source: Other Acute Inpatient Hospital | Attending: Internal Medicine

## 2023-05-24 ENCOUNTER — Encounter (HOSPITAL_COMMUNITY): Payer: Self-pay | Admitting: Internal Medicine

## 2023-05-24 DIAGNOSIS — S82101A Unspecified fracture of upper end of right tibia, initial encounter for closed fracture: Secondary | ICD-10-CM

## 2023-05-24 DIAGNOSIS — E119 Type 2 diabetes mellitus without complications: Secondary | ICD-10-CM

## 2023-05-24 DIAGNOSIS — S82101G Unspecified fracture of upper end of right tibia, subsequent encounter for closed fracture with delayed healing: Secondary | ICD-10-CM | POA: Diagnosis not present

## 2023-05-24 DIAGNOSIS — I10 Essential (primary) hypertension: Secondary | ICD-10-CM | POA: Diagnosis not present

## 2023-05-24 DIAGNOSIS — F0390 Unspecified dementia without behavioral disturbance: Secondary | ICD-10-CM | POA: Diagnosis not present

## 2023-05-24 HISTORY — PX: ORIF TIBIA PLATEAU: SHX2132

## 2023-05-24 LAB — BASIC METABOLIC PANEL
Anion gap: 12 (ref 5–15)
BUN: 31 mg/dL — ABNORMAL HIGH (ref 8–23)
CO2: 20 mmol/L — ABNORMAL LOW (ref 22–32)
Calcium: 7.9 mg/dL — ABNORMAL LOW (ref 8.9–10.3)
Chloride: 112 mmol/L — ABNORMAL HIGH (ref 98–111)
Creatinine, Ser: 0.99 mg/dL (ref 0.44–1.00)
GFR, Estimated: 56 mL/min — ABNORMAL LOW (ref >60)
Glucose, Bld: 105 mg/dL — ABNORMAL HIGH (ref 70–99)
Potassium: 3.8 mmol/L (ref 3.5–5.1)
Sodium: 144 mmol/L (ref 135–145)

## 2023-05-24 LAB — CBC
HCT: 26.1 % — ABNORMAL LOW (ref 36.0–46.0)
Hemoglobin: 8.5 g/dL — ABNORMAL LOW (ref 12.0–15.0)
MCH: 31.6 pg (ref 26.0–34.0)
MCHC: 32.6 g/dL (ref 30.0–36.0)
MCV: 97 fL (ref 80.0–100.0)
Platelets: 200 10*3/uL (ref 150–400)
RBC: 2.69 MIL/uL — ABNORMAL LOW (ref 3.87–5.11)
RDW: 14.4 % (ref 11.5–15.5)
WBC: 7.8 10*3/uL (ref 4.0–10.5)
nRBC: 0 % (ref 0.0–0.2)

## 2023-05-24 LAB — GLUCOSE, CAPILLARY: Glucose-Capillary: 100 mg/dL — ABNORMAL HIGH (ref 70–99)

## 2023-05-24 SURGERY — OPEN REDUCTION INTERNAL FIXATION (ORIF) TIBIAL PLATEAU
Anesthesia: General | Site: Leg Lower | Laterality: Right

## 2023-05-24 MED ORDER — BUPIVACAINE-EPINEPHRINE 0.5% -1:200000 IJ SOLN
INTRAMUSCULAR | Status: DC | PRN
  Administered 2023-05-24: 30 mL

## 2023-05-24 MED ORDER — BUPIVACAINE-EPINEPHRINE (PF) 0.5% -1:200000 IJ SOLN
INTRAMUSCULAR | Status: AC
Start: 2023-05-24 — End: ?
  Filled 2023-05-24: qty 30

## 2023-05-24 MED ORDER — LACTATED RINGERS IV SOLN: INTRAVENOUS | Status: DC

## 2023-05-24 MED ORDER — LACTATED RINGERS IV SOLN: INTRAVENOUS | Status: DC | PRN

## 2023-05-24 MED ORDER — ONDANSETRON HCL 4 MG/2ML IJ SOLN
INTRAMUSCULAR | Status: AC
  Filled 2023-05-24: qty 2

## 2023-05-24 MED ORDER — FENTANYL CITRATE PF 50 MCG/ML IJ SOSY
25.0000 ug | PREFILLED_SYRINGE | INTRAMUSCULAR | Status: DC | PRN
  Filled 2023-05-24: qty 1

## 2023-05-24 MED ORDER — DONEPEZIL HCL 5 MG PO TABS
10.0000 mg | ORAL_TABLET | Freq: Every day | ORAL | Status: DC
  Administered 2023-05-24 – 2023-05-26 (×3): 10 mg via ORAL
  Filled 2023-05-24 (×3): qty 2

## 2023-05-24 MED ORDER — VANCOMYCIN HCL 1000 MG IV SOLR
INTRAVENOUS | Status: DC | PRN
  Administered 2023-05-24: 1000 mg via TOPICAL

## 2023-05-24 MED ORDER — CHLORHEXIDINE GLUCONATE 0.12 % MT SOLN: 15.0000 mL | Freq: Once | OROMUCOSAL | Status: DC

## 2023-05-24 MED ORDER — PROPOFOL 10 MG/ML IV BOLUS
INTRAVENOUS | Status: DC | PRN
  Administered 2023-05-24: 70 mg via INTRAVENOUS
  Administered 2023-05-24: 30 mg via INTRAVENOUS

## 2023-05-24 MED ORDER — ORAL CARE MOUTH RINSE: 15.0000 mL | Freq: Once | OROMUCOSAL | Status: DC

## 2023-05-24 MED ORDER — 0.9 % SODIUM CHLORIDE (POUR BTL) OPTIME
TOPICAL | Status: DC | PRN
  Administered 2023-05-24: 1000 mL

## 2023-05-24 MED ORDER — OXYCODONE HCL 5 MG PO TABS: 5.0000 mg | ORAL_TABLET | Freq: Once | ORAL | Status: DC | PRN

## 2023-05-24 MED ORDER — PROPOFOL 10 MG/ML IV BOLUS
INTRAVENOUS | Status: AC
  Filled 2023-05-24: qty 20

## 2023-05-24 MED ORDER — ROCURONIUM BROMIDE 10 MG/ML (PF) SYRINGE
PREFILLED_SYRINGE | INTRAVENOUS | Status: AC
  Filled 2023-05-24: qty 10

## 2023-05-24 MED ORDER — OXYCODONE HCL 5 MG/5ML PO SOLN: 5.0000 mg | Freq: Once | ORAL | Status: DC | PRN

## 2023-05-24 MED ORDER — SUGAMMADEX SODIUM 200 MG/2ML IV SOLN
INTRAVENOUS | Status: DC | PRN
  Administered 2023-05-24: 100 mg via INTRAVENOUS

## 2023-05-24 MED ORDER — FENTANYL CITRATE (PF) 100 MCG/2ML IJ SOLN
INTRAMUSCULAR | Status: AC
  Filled 2023-05-24: qty 2

## 2023-05-24 MED ORDER — CEFAZOLIN SODIUM-DEXTROSE 2-4 GM/100ML-% IV SOLN
2.0000 g | Freq: Three times a day (TID) | INTRAVENOUS | Status: AC
  Administered 2023-05-24 – 2023-05-25 (×3): 2 g via INTRAVENOUS
  Filled 2023-05-24 (×3): qty 100

## 2023-05-24 MED ORDER — SERTRALINE HCL 50 MG PO TABS
50.0000 mg | ORAL_TABLET | Freq: Every day | ORAL | Status: DC
  Administered 2023-05-24 – 2023-05-26 (×3): 50 mg via ORAL
  Filled 2023-05-24 (×3): qty 1

## 2023-05-24 MED ORDER — VANCOMYCIN HCL 1000 MG IV SOLR
INTRAVENOUS | Status: AC
  Filled 2023-05-24: qty 20

## 2023-05-24 MED ORDER — ROCURONIUM BROMIDE 100 MG/10ML IV SOLN
INTRAVENOUS | Status: DC | PRN
  Administered 2023-05-24: 40 mg via INTRAVENOUS

## 2023-05-24 MED ORDER — RISPERIDONE 0.5 MG PO TABS
0.5000 mg | ORAL_TABLET | Freq: Every day | ORAL | Status: DC
  Administered 2023-05-24 – 2023-05-26 (×3): 0.5 mg via ORAL
  Filled 2023-05-24 (×3): qty 1

## 2023-05-24 MED ORDER — LIDOCAINE HCL (PF) 2 % IJ SOLN
INTRAMUSCULAR | Status: AC
  Filled 2023-05-24: qty 5

## 2023-05-24 MED ORDER — LIDOCAINE HCL (CARDIAC) PF 100 MG/5ML IV SOSY
PREFILLED_SYRINGE | INTRAVENOUS | Status: DC | PRN
  Administered 2023-05-24: 50 mg via INTRATRACHEAL

## 2023-05-24 MED ORDER — FENTANYL CITRATE (PF) 100 MCG/2ML IJ SOLN
INTRAMUSCULAR | Status: DC | PRN
  Administered 2023-05-24 (×2): 25 ug via INTRAVENOUS

## 2023-05-24 SURGICAL SUPPLY — 76 items
APL PRP STRL LF DISP 70% ISPRP (MISCELLANEOUS) ×1
BIT DRILL 2.8 (DRILL) ×1
BIT DRL 2.8 (DRILL) ×1
BLADE SURG 15 STRL LF DISP TIS (BLADE) ×1
BLADE SURG 15 STRL SS (BLADE) ×1
BLADE SURG SZ10 CARB STEEL (BLADE) ×1
BNDG CMPR 9X6 STRL LF SNTH (GAUZE/BANDAGES/DRESSINGS) ×1
BNDG CMPR STD VLCR NS LF 5.8X4 (GAUZE/BANDAGES/DRESSINGS) ×1
BNDG CMPR STD VLCR NS LF 5.8X6 (GAUZE/BANDAGES/DRESSINGS) ×1
BNDG COHESIVE 4X5 TAN STRL (GAUZE/BANDAGES/DRESSINGS) ×1
BNDG ELASTIC 4X5.8 VLCR NS LF (GAUZE/BANDAGES/DRESSINGS) ×1
BNDG ELASTIC 6X5.8 VLCR NS LF (GAUZE/BANDAGES/DRESSINGS) ×1
BNDG ESMARK 6X9 LF (GAUZE/BANDAGES/DRESSINGS) ×1
BNDG GAUZE DERMACEA FLUFF 4 (GAUZE/BANDAGES/DRESSINGS) ×1
BNDG GZE DERMACEA 4 6PLY (GAUZE/BANDAGES/DRESSINGS) ×1
CHLORAPREP W/TINT 26 (MISCELLANEOUS) ×1
CLOTH BEACON ORANGE TIMEOUT ST (SAFETY) ×1
COOLER ICEMAN CLASSIC (MISCELLANEOUS)
COVER LIGHT HANDLE STERIS (MISCELLANEOUS) ×2
COVER MAYO STAND XLG (MISCELLANEOUS) ×1
CUFF TOURN SGL QUICK 34 (TOURNIQUET CUFF) ×1
CUFF TRNQT CYL 34X4.125X (TOURNIQUET CUFF) ×1
DECANTER SPIKE VIAL GLASS SM (MISCELLANEOUS) ×1
DRAPE C-ARM FOLDED MOBILE STRL (DRAPES) ×1
DRAPE C-ARMOR (DRAPES) ×1
ELECT REM PT RETURN 9FT ADLT (ELECTROSURGICAL) ×1
GAUZE SPONGE 4X4 12PLY STRL (GAUZE/BANDAGES/DRESSINGS) ×1
GLOVE BIO SURGEON STRL SZ7 (GLOVE) ×1
GLOVE BIO SURGEON STRL SZ8 (GLOVE) ×3
GLOVE BIOGEL PI IND STRL 7.0 (GLOVE) ×3
GLOVE SRG 8 PF TXTR STRL LF DI (GLOVE) ×1
GLOVE SURG UNDER POLY LF SZ8 (GLOVE) ×1
GOWN STRL REUS W/TWL LRG LVL3 (GOWN DISPOSABLE) ×3
GUIDEPIN TT 1.5X150 (PIN) ×2
IMMOBILIZER KNEE 19 UNV (ORTHOPEDIC SUPPLIES) ×1
K-WIRE SGL PT/SMTH 9X35 (WIRE)
KIT TURNOVER KIT A (KITS) ×1
KWIRE SGL PT/SMTH 9X45IN (WIRE)
MANIFOLD NEPTUNE II (INSTRUMENTS) ×1
NDL HYPO 21X1.5 SAFETY (NEEDLE) ×1 IMPLANT
NDL MA TROC 1/2 (NEEDLE) IMPLANT
NEEDLE HYPO 21X1.5 SAFETY (NEEDLE) ×1
NEEDLE MA TROC 1/2 (NEEDLE)
NS IRRIG 1000ML POUR BTL (IV SOLUTION) ×1
PACK BASIC LIMB (CUSTOM PROCEDURE TRAY) ×1
PAD ARMBOARD 7.5X6 YLW CONV (MISCELLANEOUS) ×1
PAD CAST 4YDX4 CTTN HI CHSV (CAST SUPPLIES)
PAD COLD SHLDR WRAP-ON (PAD)
PADDING CAST COTTON 4X4 STRL (CAST SUPPLIES)
PADDING CAST COTTON 6X4 STRL (CAST SUPPLIES)
PASSER SUT SWANSON 36MM LOOP (INSTRUMENTS)
PLATE TIBIA PROX RT 3H (Plate) ×1 IMPLANT
POSITIONER HEAD 8X9X4 ADT (SOFTGOODS) ×1
SCREW LOCK 55X3.5XVA (Screw) ×1 IMPLANT
SCREW LOCK VA 3.5X70 (Screw) ×2 IMPLANT
SCREW LOCKING 3.5X55 (Screw) ×1 IMPLANT
SCREW LOCKING VAR ANGL 3.5X75 (Screw) ×3 IMPLANT
SCREW NLOCK WORKHORSE 3.5X30 (Screw) ×1 IMPLANT
SCREW WORKHORSE 3.5X36 (Screw) ×1 IMPLANT
SCREW WORKHORSE 3.5X38 (Screw) ×1 IMPLANT
SET BASIN LINEN APH (SET/KITS/TRAYS/PACK) ×1
SPONGE T-LAP 18X18 ~~LOC~~+RFID (SPONGE) ×1
STAPLER VISISTAT (STAPLE)
STRIP CLOSURE SKIN 1/2X4 (GAUZE/BANDAGES/DRESSINGS) ×2
SUT MNCRL 0 VIOLET CTX 36 (SUTURE)
SUT MNCRL AB 4-0 PS2 18 (SUTURE) ×1
SUT MON AB 0 CT1 (SUTURE)
SUT MON AB 2-0 SH 27 (SUTURE) ×2
SUT MON AB 2-0 SH27 (SUTURE) ×2
SUT NUROLON CT 2 BLK #1 18IN (SUTURE)
SUT VIC AB 1 CT1 27 (SUTURE) ×2
SUT VIC AB 1 CT1 27XBRD ANTBC (SUTURE) ×2
SUT VIC AB 2-0 CT2 27 (SUTURE) ×1
SYR 30ML LL (SYRINGE) ×1
SYR BULB IRRIG 60ML STRL (SYRINGE) ×2
TUBE SUCTION HIGH CAP CLEAR NV (SUCTIONS) ×1

## 2023-05-24 NOTE — Progress Notes (Addendum)
PROGRESS NOTE  Tamara [name] UJW:[id number] DOB: 1933-09-07 DOA: 05/23/2023 PCP: [name], MD  Brief History:  89 year old female with a history of dementia, diabetes mellitus, hypertension, right breast cancer, and B12 deficiency, Fe deficiency, aortic valve stenosis presented as a transfer from [facility] secondary to right knee pain and swelling.  Per report, patient sustained a hyperflexion injury to the right knee when her leg came out of the wheelchair while daughter was pushing her up the ramp.  She noted swelling of the right knee as well as the right upper tibia-fibula area.  The patient was taken to [facility] emergency department.  X-ray showed an acute right proximal tibial metaphyseal fracture.  Orthopedics, Dr. Yevette Edwards was consulted.  He recommended transfer the patient to [facility].  Notably, the patient had a recent hospital admission from 03/23/2023 to 03/26/2023 secondary to acute respiratory failure from pneumonia.  The patient was treated with ceftriaxone and azithromycin.  She was discharged home with cephalexin and azithromycin and prednisone.  She was discharged to [facility] care SNF.  She was weaned off oxygen at the time of discharge.  She had a prior hospital mission from 11/21/2022 to 11/25/2022 when she was treated for right distal femur fracture.  A supracondylar nail was placed by Dr. Dallas Schimke on 11/22/2022.  She was subsequently discharged to skilled nursing facility.   Assessment/Plan: Closed fracture of right proximal tibia -Orthopedics, Dr. Dallas Schimke appreciated -plan ORIF 11/4 -Judicious opioids -anticipate SNF after ORIF   Dementia without behavioral disturbance -Continue Aricept -pt had hospital delirium during last 2 hospitalizations -Able to answer simple questions, and for the most part recognizes family    Essential hypertension -amlodipine and losartan discontinued since her 11/2022 admission -BP soft>>improved, remains well controlled off  anti-HTN meds   Diabetes mellitus type 2, controlled -Not on any medications as an outpatient -10/08/22 hemoglobin A1c--5.9   Right breast cancer -Currently in remission -Status post lumpectomy and lymph node dissection August 2014 -Patient declined radiation therapy -s/p Aromasin October 2014, continued till October 2019.  -Follow-up Dr. Ellin Saba   Anemia chronic disease -baseline Hgb ~10               Family Communication:   daughter udpated 11/4   Consultants:  ortho   Code Status:  DNR--confirmed with daughter   DVT Prophylaxis:  holding in preparation for surgery     Procedures: As Listed in Progress Note Above   Antibiotics: None         Subjective: Patient denies fevers, chills, headache, chest pain, dyspnea, nausea, vomiting, diarrhea, abdominal pain   Objective: Vitals:   05/23/23 0546 05/23/23 1300 05/23/23 2000 05/24/23 0454  BP: (!) 109/58 (!) 99/58 (!) 118/55 (!) 104/57  Pulse: 91 92 83 85  Resp:  18 18 16   Temp: 98 F (36.7 C) 98.5 F (36.9 C) (!) 97.1 F (36.2 C) (!) 97.5 F (36.4 C)  TempSrc: Oral Oral Oral Oral  SpO2: 98% 95% 95%   Weight:        Intake/Output Summary (Last 24 hours) at 05/24/2023 0824 Last data filed at 05/24/2023 0206 Gross per 24 hour  Intake --  Output 250 ml  Net -250 ml   Weight change:  Exam:  General:  Pt is alert, follows commands appropriately, not in acute distress HEENT: No icterus, No thrush, No neck mass, [place]/AT Cardiovascular: RRR, S1/S2, no rubs, no gallops Respiratory: bibasilar crackles.  No wheeze  Abdomen: Soft/+BS, non tender, non distended, no guarding Extremities: trace RLE edema, No lymphangitis, No petechiae, No rashes, no synovitis   Data Reviewed: I have personally reviewed following labs and imaging studies Basic Metabolic Panel: Recent Labs  Lab 05/23/23 0635  NA 141  K 3.8  CL 112*  CO2 21*  GLUCOSE 142*  BUN 35*  CREATININE 1.03*  CALCIUM 7.9*  MG 2.1  PHOS 3.7    Liver Function Tests: Recent Labs  Lab 05/23/23 0635  AST 16  ALT 17  ALKPHOS 66  BILITOT 1.0  PROT 6.3*  ALBUMIN 3.1*   No results for input(s): "LIPASE", "AMYLASE" in the last 168 hours. No results for input(s): "AMMONIA" in the last 168 hours. Coagulation Profile: No results for input(s): "INR", "PROTIME" in the last 168 hours. CBC: Recent Labs  Lab 05/23/23 0635  WBC 7.8  HGB 9.3*  HCT 28.4*  MCV 95.0  PLT 178   Cardiac Enzymes: No results for input(s): "CKTOTAL", "CKMB", "CKMBINDEX", "TROPONINI" in the last 168 hours. BNP: Invalid input(s): "POCBNP" CBG: No results for input(s): "GLUCAP" in the last 168 hours. HbA1C: No results for input(s): "HGBA1C" in the last 72 hours. Urine analysis:    Component Value Date/Time   COLORURINE YELLOW 11/22/2022 1200   APPEARANCEUR TURBID (A) 11/22/2022 1200   LABSPEC 1.015 11/22/2022 1200   PHURINE 5.0 11/22/2022 1200   GLUCOSEU NEGATIVE 11/22/2022 1200   HGBUR SMALL (A) 11/22/2022 1200   BILIRUBINUR NEGATIVE 11/22/2022 1200   KETONESUR NEGATIVE 11/22/2022 1200   PROTEINUR 100 (A) 11/22/2022 1200   NITRITE NEGATIVE 11/22/2022 1200   LEUKOCYTESUR SMALL (A) 11/22/2022 1200   Sepsis Labs: @LABRCNTIP (procalcitonin:4,lacticidven:4) )No results found for this or any previous visit (from the past 240 hour(s)).   Scheduled Meds:  donepezil  10 mg Oral QHS   risperiDONE  0.5 mg Oral QHS   sertraline  50 mg Oral QHS   Continuous Infusions:   ceFAZolin (ANCEF) IV      Procedures/Studies: No results found.  Catarina Hartshorn, DO  [facility]  If 7PM-7AM, please contact night-coverage [email or web address] Password [email or web address] 05/24/2023, 8:24 AM   LOS: 1 day

## 2023-05-24 NOTE — Plan of Care (Signed)
Pt has dementia. Pt continues to pull at wound care dressing. Wrapped wound care dressing w/ace bandage. Pt displayed agitation, provided back rub to soothe pt until pt fell asleep. Will monitor pt for the duration of nursing shift.

## 2023-05-24 NOTE — Anesthesia Preprocedure Evaluation (Signed)
Anesthesia Evaluation  Patient identified by MRN, date of birth, ID band Patient awake and Patient confused    Reviewed: Allergy & Precautions, H&P , NPO status , Patient's Chart, lab work & pertinent test results, reviewed documented beta blocker date and time , Unable to perform ROS - Chart review only  Airway Mallampati: II  TM Distance: >3 FB Neck ROM: full    Dental no notable dental hx. (+) Lower Dentures, Upper Dentures   Pulmonary neg pulmonary ROS, former smoker   Pulmonary exam normal breath sounds clear to auscultation       Cardiovascular Exercise Tolerance: Good hypertension, Pt. on medications negative cardio ROS Normal cardiovascular exam Rhythm:regular Rate:Normal     Neuro/Psych  PSYCHIATRIC DISORDERS Anxiety    Dementia negative neurological ROS  negative psych ROS   GI/Hepatic negative GI ROS, Neg liver ROS,,,  Endo/Other  negative endocrine ROSdiabetes, Well Controlled, Type 2, Oral Hypoglycemic Agents    Renal/GU negative Renal ROS  negative genitourinary   Musculoskeletal negative musculoskeletal ROS (+)    Abdominal   Peds negative pediatric ROS (+)  Hematology negative hematology ROS (+) Blood dyscrasia, anemia   Anesthesia Other Findings Right breast cancer  Reproductive/Obstetrics negative OB ROS                             Anesthesia Physical Anesthesia Plan  ASA: 3  Anesthesia Plan: General and General LMA   Post-op Pain Management: Dilaudid IV   Induction: Intravenous  PONV Risk Score and Plan: Ondansetron  Airway Management Planned: Nasal Cannula, Natural Airway and Oral ETT  Additional Equipment:   Intra-op Plan:   Post-operative Plan: Extubation in OR  Informed Consent: I have reviewed the patients History and Physical, chart, labs and discussed the procedure including the risks, benefits and alternatives for the proposed anesthesia with the  patient or authorized representative who has indicated his/her understanding and acceptance.     Dental Advisory Given  Plan Discussed with: CRNA  Anesthesia Plan Comments:         Anesthesia Quick Evaluation

## 2023-05-24 NOTE — TOC Initial Note (Signed)
Transition of Care [facility]) - Initial/Assessment Note    Patient Details  Name: Tamara [name] MRN: [id number] Date of Birth: 1933-09-12  Transition of Care [facility]) CM/SW Contact:    Villa Herb, LCSWA Phone Number: 05/24/2023, 2:07 PM  Clinical Narrative:                 CSW noted per chart review that pt went to OR today. CSW spoke with pts daughter who states that if SNF is recommended they will be interested in [facility] as pt has been there in the past. Daughter understands that pts may be in coinsurance days. Pt will need EMS transport. If pt does not get auth for SNF pt will likely go home with [facility] and DME needs. TOC to follow for PT recommendations.   Expected Discharge Plan: Skilled Nursing Facility Barriers to Discharge: Continued Medical Work up   Patient Goals and CMS Choice Patient states their goals for this hospitalization and ongoing recovery are:: get stronger CMS Medicare.gov Compare Post Acute Care list provided to:: Patient Represenative (must comment) Choice offered to / list presented to : Adult Children [place] ownership interest in [facility].provided to:: Adult Children    Expected Discharge Plan and Services In-house Referral: Clinical Social Work Discharge Planning Services: CM Consult Post Acute Care Choice: Skilled Nursing Facility Living arrangements for the past 2 months: Single Family Home                                      Prior Living Arrangements/Services Living arrangements for the past 2 months: Single Family Home Lives with:: Relatives Patient language and need for interpreter reviewed:: Yes Do you feel safe going back to the place where you live?: Yes      Need for Family Participation in Patient Care: Yes (Comment) Care giver support system in place?: Yes (comment)   Criminal Activity/Legal Involvement Pertinent to Current Situation/Hospitalization: No - Comment as needed  Activities of Daily Living      Permission  Sought/Granted                  Emotional Assessment Appearance:: Appears stated age       Alcohol / Substance Use: Not Applicable Psych Involvement: No (comment)  Admission diagnosis:  Closed fracture of right proximal tibia [S82.101A] Patient Active Problem List   Diagnosis Date Noted   Closed fracture of right proximal tibia 05/23/2023   Laceration of left upper extremity 05/23/2023   History of type 2 diabetes mellitus 05/23/2023   Acute hypoxic respiratory failure (HCC) 03/23/2023   Closed comminuted intra-articular fracture of distal femur, right, with delayed healing, subsequent encounter 11/21/2022   Closed displaced comminuted fracture of shaft of right femur (HCC) 11/21/2022   Iron deficiency 10/27/2022   Aortic atherosclerosis (HCC) 10/19/2022   Hypertension associated with type 2 diabetes mellitus (HCC) 10/19/2022   Cystitis with hematuria 10/11/2022   Postoperative fever 10/10/2022   Closed fracture of right hip (HCC) 10/08/2022   Dementia without behavioral disturbance (HCC) 10/08/2022   Fall at home, initial encounter 10/08/2022   Aortic valve stenosis, nonrheumatic 03/24/2022   Pre-diabetes 03/24/2022   Disorder of mitral valve 03/24/2022   Essential (primary) hypertension 03/24/2022   Fibrosis lung (HCC) 03/24/2022   Acute blood loss anemia 12/15/2016   B12 deficiency 06/08/2016   Breast CA (HCC) 06/26/2014   Malignant neoplasm of upper-outer quadrant of RIGHT  female breast (HCC) 04/25/2013   Osteoporosis 08/10/2007   PCP:  [name], MD Pharmacy:   [facility] [address], [place] - [address] [place] [address] [place] [address] [place] [address] Phone: [phone] Fax: [phone]  [facility] [place], [place] - [address] [place] [address] Phone: [phone] Fax: [phone]     Social Determinants of Health (SDOH) Social History: SDOH Screenings   Food Insecurity: No Food Insecurity  (03/23/2023)  Housing: Low Risk  (03/23/2023)  Transportation Needs: No Transportation Needs (04/02/2023)   Received from [facility]  Utilities: Not At Risk (03/23/2023)  Depression (PHQ2-9): Low Risk  (10/14/2022)  Tobacco Use: Medium Risk (05/24/2023)  Health Literacy: High Risk (04/02/2023)   [facility] from [facility]   SDOH Interventions:     Readmission Risk Interventions    05/24/2023    2:06 PM 11/23/2022   10:51 AM  Readmission Risk Prevention Plan  Transportation Screening Complete Complete  PCP or Specialist Appt within 5-7 Days  Not Complete  Home Care Screening  Complete  Medication Review (RN CM)  Complete  HRI or Home Care Consult Complete   Social Work Consult for Recovery Care Planning/Counseling Complete   Palliative Care Screening Not Applicable   Medication Review Oceanographer) Complete

## 2023-05-24 NOTE — Progress Notes (Signed)
ORTHOPAEDIC PROGRESS NOTE  Scheduled for Procedure(s): OPEN REDUCTION INTERNAL FIXATION (ORIF) TIBIAL PLATEAU; ORIF R proximal tibia fracture   DOS: 05/24/2023  SUBJECTIVE: No issues over night.  No family at bedside.    OBJECTIVE: PE:  Confused.  Demented  Right leg with swelling and bruising Compartments are soft Wiggles toes Responds to light touch  Vitals:   05/23/23 2000 05/24/23 0454  BP: (!) 118/55 (!) 104/57  Pulse: 83 85  Resp: 18 16  Temp: (!) 97.1 F (36.2 C) (!) 97.5 F (36.4 C)  SpO2: 95%       Latest Ref Rng & Units 05/23/2023    6:35 AM 03/24/2023    4:49 AM 03/23/2023   12:50 PM  CBC  WBC 4.0 - 10.5 K/uL 7.8  6.0  7.6   Hemoglobin 12.0 - 15.0 g/dL 9.3  16.1  09.6   Hematocrit 36.0 - 46.0 % 28.4  31.6  36.3   Platelets 150 - 400 K/uL 178  196  212      ASSESSMENT: Tamara [name] is a 89 y.o. female stable, ready for surgery.  NPO since midnight.  PLAN: Weightbearing: NWB RLE Incisional and dressing care: Reinforce dressings as needed; none currently Orthopedic device(s): None; knee immobilizer postop VTE prophylaxis: None currently, please hold until POD#1 Pain control: PRN medications, judicious use of narcotics Follow - up plan: 2 weeks postop   Contact information:     Kellie Murrill A. Dallas Schimke, MD MS [facility] [address] [place],  [place]  [address] Phone: [phone] Fax: [phone]

## 2023-05-24 NOTE — Transfer of Care (Signed)
Immediate Anesthesia Transfer of Care Note  Patient: Tamara [name]  Procedure(s) Performed: OPEN REDUCTION INTERNAL FIXATION (ORIF) TIBIAL PLATEAU (Right: Leg Lower)  Patient Location: PACU  Anesthesia Type:General  Level of Consciousness: awake  Airway & Oxygen Therapy: Patient Spontanous Breathing  Post-op Assessment: Report given to RN  Post vital signs: Reviewed and stable  Last Vitals:  Vitals Value Taken Time  BP 121/58 05/24/23 1215  Temp    Pulse    Resp 14 05/24/23 1217  SpO2    Vitals shown include unfiled device data.  Last Pain:  Vitals:   05/24/23 0922  TempSrc: Oral  PainSc:          Complications: No notable events documented.

## 2023-05-24 NOTE — Op Note (Signed)
Orthopaedic Surgery Operative Note (CSN: [id number])  Tamara [name]  [birth year] Date of Surgery: 05/24/2023   Diagnoses:  Right proximal tibia fracture  Procedure: ORIF of right proximal tibia fracture   Operative Finding Successful completion of the planned procedure.  Fracture is minimally displaced.  Using an anterolateral approach, dissected down to the surface of the tibia.  Fracture remained minimally displaced.  Used a proximal tibial plate, and secured the fracture with multiple locking screws proximally, as well as several bicortical screws distally.  Post-Op Diagnosis: Same Surgeons:Primary: Oliver Barre, MD Assistants: Cecile Sheerer Location: [facility] OR ROOM 4 Anesthesia: General with local anesthesia Antibiotics: Ancef 2 g with local vancomycin powder 1 g at the surgical site Tourniquet time:  Total Tourniquet Time Documented: Thigh (Right) - 62 minutes Total: Thigh (Right) - 62 minutes  Estimated Blood Loss: 50 cc Complications: None Specimens: None  Implants: Implant Name Type Inv. Item Serial No. Manufacturer Lot No. LRB No. Used Action  SCREW LOCKING VAR ANGL 3.5X75 - SSTERILE ON SET Screw SCREW LOCKING VAR ANGL 3.5X75 STERILE ON SET [employer]  Right 1 Implanted  SCREW LOCKING VAR ANGL 3.5X75 - SSTERILE ON SET Screw SCREW LOCKING VAR ANGL 3.5X75 STERILE ON SET [employer]  Right 1 Implanted  SCREW LOCKING VAR ANGL 3.5X75 - SSTERILE ON SET Screw SCREW LOCKING VAR ANGL 3.5X75 STERILE ON SET [employer]  Right 1 Implanted  SCREW LOCK VA 3.5X70 - SSTERILE ON SET Screw SCREW LOCK VA 3.5X70 [employer]ERILE ON SET [employer]  Right 1 Implanted  SCREW LOCK VA 3.5X70 - SSTERILE ON SET Screw SCREW LOCK VA 3.5X70 [employer]ER[employer] ON [employer]  Right 1 Implanted  SCREW LOCKING 3.5X55 - SSTERILE ON SET Screw SCREW LOCKING 3.5X55 STERILE ON SET [employer]  Right 1 Implanted  SCREW NLOCK WORKHORSE 3.5X30 - SSTERILE ON SET Screw SCREW NLOCK WORKHORSE 3.5X30 [employer]ERILE ON SET [employer]  Right  1 Implanted  SCREW WORKHORSE 3.5X36 - SSTERILE ON SET Screw SCREW WORKHORSE 3.5X36 [employer]  Right 1 Implanted  SCREW WORKHORSE 3.5X38 - SSTERILE ON SET Screw SCREW WORKHORSE 3.5X38 [employer]ER[employer] ON [employer]  Right 1 Implanted  PLATE TIBIA PROX RT 3H - SSTERILE ON SET Plate PLATE TIBIA PROX RT 3H STERILE ON SET [employer]  Right 1 Implanted    Indications for Surgery:   Tamara [name] is a 89 y.o. female who has undergone multiple procedures this year, and was slowly recovering.  She was using a wheelchair, when her right leg got caught underneath the wheels of the wheelchair.  She sustained an injury to the proximal tibia.  I had an extensive discussion with the patient's family, in regards to proceeding with nonoperative versus operative management.  Regardless of decision for treatment, patient would remain nonweightbearing for 4-6 weeks.  After discussing all the risks and the benefits with the patient's daughter, she has elected to proceed with surgery.  Benefits and risks of operative and nonoperative management were discussed prior to surgery with the patient's family and informed consent form was completed.  Specific risks including infection, need for additional surgery, bleeding, persistent pain, persistent stiffness, nonunion, malunion, blood clots and more severe complications associated with anesthesia.  The patient's family elected proceed with surgery.  Surgical consent was finalized.   Procedure:   The patient was identified properly. Informed consent was obtained and the surgical site was marked. The patient was taken to the OR where general anesthesia was induced.  The patient was positioned supine, with the right leg on bone foam.  The right leg was prepped and draped in the usual sterile fashion.  Timeout was performed before the beginning of the case.  Tourniquet was used for the above duration.  She received 2 g of Ancef prior to making incision.  We planned a  curvilinear incision over the anterolateral aspect of the proximal tibia.  We incised sharply through skin.  We then carried this dissection down through subcutaneous tissue and through the subcutaneous fat.  We created full-thickness flaps in all directions.  The fascia lying over the anterior compartment was identified.  The tibial crest was identified, and we prepared to incise the fascia just lateral to the tibial crest, leaving a cuff for later repair.  This was carried out proximally to the joint line.  The underlying muscle belly was then subperiosteally elevated to create space for a plate.  At this point, we evaluated the proximal tibia under fluoroscopy.  The fracture was identified, but there was no displacement.  We selected the above sized plate from the back table.  With the assistance of fluoroscopy, we approximated the positioning of the plate, to confirm that it would adequately secure the fracture.  Once were satisfied with the overall positioning, the plate was provisionally secured with multiple K wires.  The proximal K wire ensure that our proximal screws would be in sub articular bone.  We then proceeded to place a single screw in the distal shaft portion of the plate.  This was bicortical, to ensure that the plate was appropriately positioned and on the surface of the bone.  Overall placement was once again confirmed under fluoroscopy.  We then proceeded to place multiple screws within the proximal cluster.  The first 2 screws placed were bicortical.  We then placed additional screws in the proximal row of the proximal cluster.  This was done with the assistance of fluoroscopy.  We are satisfied with the position of these screws.  We then turned our attention to the distal portion of the plate.  We placed an additional 2 bicortical screws.  Bone quality was not great, but the screws achieved pretty good purchase.  Finally, we placed a single oblique screw through the proximal fragment.  The  proximal tibia was closely evaluated under fluoroscopy.  Final fluoroscopic views demonstrated plate and screws construct in excellent position.  Fracture had not displaced.  Joint line remained intact.  We irrigated the wound copiously.  We placed some Vanc powder within the wound bed.  The fascial layer was closed loosely, including over the plate is much as possible.  Next, we closed the incision in a multilayer fashion with absorbable suture.  Local anesthetic was injected around the incision.  Sterile dressing was placed, followed by a knee immobilizer.  Patient was awoken taken to PACU in stable condition.   Post-operative plan:  The patient will be NWB on the operative extremity Discharge home from the PACU once they have recovered DVT prophylaxis per primary team, no orthopedic contraindications.    Pain control with PRN pain medication preferring oral medicines.   Follow up plan will be scheduled in approximately 10-14 days for incision check and XR.

## 2023-05-24 NOTE — Anesthesia Procedure Notes (Addendum)
Procedure Name: Intubation Date/Time: 05/24/2023 10:30 AM  Performed by: Moshe Salisbury, CRNAPre-anesthesia Checklist: Patient identified, Patient being monitored, Timeout performed, Emergency Drugs available and Suction available Patient Re-evaluated:Patient Re-evaluated prior to induction Oxygen Delivery Method: Circle system utilized Preoxygenation: Pre-oxygenation with 100% oxygen Induction Type: IV induction Ventilation: Mask ventilation without difficulty Laryngoscope Size: Mac and 3 Grade View: Grade I Tube type: Oral Tube size: 7.0 mm Number of attempts: 1 Airway Equipment and Method: Stylet Placement Confirmation: ETT inserted through vocal cords under direct vision, positive ETCO2 and breath sounds checked- equal and bilateral Secured at: 21 cm Tube secured with: Tape Dental Injury: Teeth and Oropharynx as per pre-operative assessment

## 2023-05-24 NOTE — Progress Notes (Signed)
Patient alert with confusion. Returned from procedure at 1330, patient sleeping would awaken for a few seconds then went back to sleep. Family at bedside at the time. Later during shift, patient more alert being assisted with dinner. Patient reported no complaints of pain or discomfort.

## 2023-05-25 DIAGNOSIS — S82101D Unspecified fracture of upper end of right tibia, subsequent encounter for closed fracture with routine healing: Secondary | ICD-10-CM | POA: Diagnosis not present

## 2023-05-25 DIAGNOSIS — I10 Essential (primary) hypertension: Secondary | ICD-10-CM | POA: Diagnosis not present

## 2023-05-25 DIAGNOSIS — F0390 Unspecified dementia without behavioral disturbance: Secondary | ICD-10-CM | POA: Diagnosis not present

## 2023-05-25 LAB — CBC
HCT: 24.6 % — ABNORMAL LOW (ref 36.0–46.0)
Hemoglobin: 7.7 g/dL — ABNORMAL LOW (ref 12.0–15.0)
MCH: 30.8 pg (ref 26.0–34.0)
MCHC: 31.3 g/dL (ref 30.0–36.0)
MCV: 98.4 fL (ref 80.0–100.0)
Platelets: 206 10*3/uL (ref 150–400)
RBC: 2.5 MIL/uL — ABNORMAL LOW (ref 3.87–5.11)
RDW: 14.4 % (ref 11.5–15.5)
WBC: 8 10*3/uL (ref 4.0–10.5)
nRBC: 0 % (ref 0.0–0.2)

## 2023-05-25 LAB — BASIC METABOLIC PANEL
Anion gap: 8 (ref 5–15)
BUN: 32 mg/dL — ABNORMAL HIGH (ref 8–23)
CO2: 22 mmol/L (ref 22–32)
Calcium: 7.4 mg/dL — ABNORMAL LOW (ref 8.9–10.3)
Chloride: 113 mmol/L — ABNORMAL HIGH (ref 98–111)
Creatinine, Ser: 1.09 mg/dL — ABNORMAL HIGH (ref 0.44–1.00)
GFR, Estimated: 49 mL/min — ABNORMAL LOW (ref >60)
Glucose, Bld: 130 mg/dL — ABNORMAL HIGH (ref 70–99)
Potassium: 3.7 mmol/L (ref 3.5–5.1)
Sodium: 143 mmol/L (ref 135–145)

## 2023-05-25 LAB — MAGNESIUM: Magnesium: 2.2 mg/dL (ref 1.7–2.4)

## 2023-05-25 MED ORDER — HEPARIN SODIUM (PORCINE) 5000 UNIT/ML IJ SOLN
5000.0000 [IU] | Freq: Three times a day (TID) | INTRAMUSCULAR | Status: DC
  Administered 2023-05-25 – 2023-05-27 (×5): 5000 [IU] via SUBCUTANEOUS
  Filled 2023-05-25 (×5): qty 1

## 2023-05-25 MED ORDER — LACTATED RINGERS IV BOLUS
500.0000 mL | Freq: Once | INTRAVENOUS | Status: AC
  Administered 2023-05-25: 500 mL via INTRAVENOUS

## 2023-05-25 MED ORDER — HYDROCODONE-ACETAMINOPHEN 5-325 MG PO TABS
1.0000 | ORAL_TABLET | Freq: Four times a day (QID) | ORAL | Status: DC | PRN
  Administered 2023-05-25 – 2023-05-27 (×2): 1 via ORAL
  Filled 2023-05-25 (×3): qty 1

## 2023-05-25 NOTE — Evaluation (Signed)
Physical Therapy Evaluation Patient Details Name: BIBIANA GILLEAN MRN: [id number] DOB: 1933/10/17 Today's Date: 05/25/2023  History of Present Illness  Tamara [name] is a 89 y.o. female s/p ORIF of right proximal tibia fracture on 05/24/23 with medical history significant of hypertension, type 2 diabetes mellitus, dementia, right breast cancer, aortic valve stenosis, lung fibrosis presents to [facility] ED due to right knee pain and swelling.  Patient was unable to provide history possibly due to underlying dementia, history was obtained from ED medical record.  Per report, patient sustained a hyperflexion injury to the right knee when the leg came on the wheelchair while daughter was pushing her up the ramp.  She noted swelling of the right knee as well as the right upper tibia-fibula area.  There was no report of chest pain, shortness of breath, fever, chills, nausea or vomiting.     Of note, earlier in the day, patient presents to [facility] urgent care for evaluation of left forearm laceration which occurred when accidentally brushed against a nail sticking out of the wall.  Pressure dressing was applied prior to arrival to the urgent care with bleeding being controlled and no numbness, tingling or decreased range of motion.  Pressure dressing was applied at the urgent care due to patient's preference, Hibiclens and mupirocin ordered for home wound care with instructions.     Patient was admitted from 9/3 to 03/26/2023 due to community acquired pneumonia of left lower lobe of lung and acute hypoxic respiratory failure and was treated with IV Rocephin and azithromycin, she was weaned off supplemental oxygen and discharged home on Keflex, azithromycin and prednisone.     She was also admitted from 5//24 to 11/25/2022 due to right distal femur fracture repaired by Dr. Dallas Schimke on 11/22/2022 with supracondylar nail placement and she was discharged to SNF at that time for rehabilitation.   Clinical Impression   Patient demonstrates slow labored movement for sitting up at bedside with c/o severe pain RLE, has difficulty maintaining sitting balance due to using hands to guard of RLE and unable to attempt sit to stands or transferring to chair.  Patient put back to bed with Max assist for repositioning.  Patient will benefit from continued skilled physical therapy in hospital and recommended venue below to increase strength, balance, endurance for safe ADLs and gait.          If plan is discharge home, recommend the following: A lot of help with bathing/dressing/bathroom;A lot of help with walking and/or transfers;Help with stairs or ramp for entrance;Assistance with cooking/housework   Can travel by private vehicle   No    Equipment Recommendations None recommended by PT  Recommendations for Other Services       Functional Status Assessment Patient has had a recent decline in their functional status and demonstrates the ability to make significant improvements in function in a reasonable and predictable amount of time.     Precautions / Restrictions Precautions Precautions: Fall Restrictions Weight Bearing Restrictions: Yes RLE Weight Bearing: Non weight bearing      Mobility  Bed Mobility Overal bed mobility: Needs Assistance Bed Mobility: Supine to Sit, Sit to Supine     Supine to sit: Max assist Sit to supine: Max assist   General bed mobility comments: limited for moving RLE due to c/o severe pain/guarding    Transfers                        Ambulation/Gait  Stairs            Wheelchair Mobility     Tilt Bed    Modified Rankin (Stroke Patients Only)       Balance Overall balance assessment: Needs assistance Sitting-balance support: Feet unsupported, Bilateral upper extremity supported Sitting balance-Leahy Scale: Poor Sitting balance - Comments: seated at EOB                                     Pertinent  Vitals/Pain Pain Assessment Pain Assessment: Faces Faces Pain Scale: Hurts whole lot Pain Location: right RLE Pain Descriptors / Indicators: Moaning, Grimacing, Guarding, Sharp, Discomfort Pain Intervention(s): Limited activity within patient's tolerance, Monitored during session, Premedicated before session, Repositioned    Home Living Family/patient expects to be discharged to:: Private residence Living Arrangements: Children Available Help at Discharge: Family;Personal care attendant;Other (Comment) Type of Home: House Home Access: Stairs to enter Entrance Stairs-Rails: Right Entrance Stairs-Number of Steps: 4   Home Layout: Two level;Able to live on main level with bedroom/bathroom Home Equipment: Rolling Walker (2 wheels);BSC/3in1;Shower seat - built in;Wheelchair - manual      Prior Function Prior Level of Function : Needs assist       Physical Assist : Mobility (physical);ADLs (physical) Mobility (physical): Bed mobility;Transfers;Gait;Stairs ADLs (physical): Dressing;Bathing;IADLs Mobility Comments: [place] ambulator with RW and assist. Assisted for bed mobility. ADLs Comments: Assist for bathing, dressing, and IADL's.     Extremity/Trunk Assessment   Upper Extremity Assessment Upper Extremity Assessment: Defer to OT evaluation    Lower Extremity Assessment Lower Extremity Assessment: Generalized weakness;RLE deficits/detail RLE Deficits / Details: grossly -3/5 RLE: Unable to fully assess due to immobilization;Unable to fully assess due to pain RLE Sensation: WNL RLE Coordination: WNL    Cervical / Trunk Assessment Cervical / Trunk Assessment: Normal  Communication   Communication Communication: Difficulty following commands/understanding;Other (comment) (patient has difficulty talking "word salid" salide at times) Following commands: Follows one step commands with increased time Cueing Techniques: Verbal cues;Tactile cues  Cognition Arousal:  Alert Behavior During Therapy: Anxious Overall Cognitive Status: No family/caregiver present to determine baseline cognitive functioning                                 General Comments: requires Max verbal/tactile for following instructions        General Comments      Exercises     Assessment/Plan    PT Assessment Patient needs continued PT services  PT Problem List Decreased strength;Decreased activity tolerance;Decreased balance;Decreased mobility;Decreased coordination;Pain       PT Treatment Interventions DME instruction;Gait training;Functional mobility training;Therapeutic activities;Therapeutic exercise;Balance training;Patient/family education    PT Goals (Current goals can be found in the Care Plan section)  Acute Rehab PT Goals Patient Stated Goal: return home PT Goal Formulation: With patient Time For Goal Achievement: 06/08/23 Potential to Achieve Goals: Fair    Frequency Min 3X/week     Co-evaluation               AM-PAC PT "6 Clicks" Mobility  Outcome Measure Help needed turning from your back to your side while in a flat bed without using bedrails?: A Lot Help needed moving from lying on your back to sitting on the side of a flat bed without using bedrails?: A Lot Help needed moving to and from a bed to a chair (  including a wheelchair)?: Total Help needed standing up from a chair using your arms (e.g., wheelchair or bedside chair)?: Total Help needed to walk in hospital room?: Total Help needed climbing 3-5 steps with a railing? : Total 6 Click Score: 8    End of Session   Activity Tolerance: Patient tolerated treatment well;Patient limited by fatigue;Patient limited by pain Patient left: in bed;with call bell/phone within reach;with chair alarm set Nurse Communication: Mobility status PT Visit Diagnosis: Unsteadiness on feet (R26.81);Other abnormalities of gait and mobility (R26.89);Muscle weakness (generalized) (M62.81)     Time: 6045-4098 PT Time Calculation (min) (ACUTE ONLY): 21 min   Charges:   PT Evaluation $PT Eval Moderate Complexity: 1 Mod PT Treatments $Therapeutic Activity: 8-22 mins PT General Charges $$ ACUTE PT VISIT: 1 Visit         1:59 PM, 05/25/23 Ocie Bob, MPT Physical Therapist with [facility] [phone] office [phone] mobile phone

## 2023-05-25 NOTE — Progress Notes (Signed)
ORTHOPAEDIC PROGRESS NOTE  Scheduled for Procedure(s): ORIF R proximal tibia fracture   DOS: 05/24/2023  SUBJECTIVE: Patient resting comfortably.  No issues overnight.  No family at bedside.  OBJECTIVE: PE:  Confused.  Demented.  Sleeping, but arouses.  Right leg with knee immobilizer in place.  Ace wrap to the foot.  Toes are warm and well-perfused.  Active motion of her toes visualized.  Compartments are soft and compressible.  Brisk capillary refill.  Vitals:   05/25/23 0419 05/25/23 0808  BP: 105/84 (!) 96/58  Pulse: 84 81  Resp: 20 18  Temp: 99.2 F (37.3 C) 99.1 F (37.3 C)  SpO2: 94% 99%      Latest Ref Rng & Units 05/25/2023    4:33 AM 05/24/2023    8:28 AM 05/23/2023    6:35 AM  CBC  WBC 4.0 - 10.5 K/uL 8.0  7.8  7.8   Hemoglobin 12.0 - 15.0 g/dL 7.7  8.5  9.3   Hematocrit 36.0 - 46.0 % 24.6  26.1  28.4   Platelets 150 - 400 K/uL 206  200  178      ASSESSMENT: Tamara [name] is a 89 y.o. female stable POD#1  PLAN: Weightbearing: NWB RLE; knee immobilizer on at all times. Incisional and dressing care: Reinforce dressings as needed Orthopedic device(s): None; knee immobilizer postop VTE prophylaxis: None currently, please hold until POD#1 Pain control: PRN medications, judicious use of narcotics Follow - up plan: 2 weeks postop  Hemoglobin currently 7.7, continue to monitor very closely.   Contact information:     Zailyn Thoennes A. Dallas Schimke, MD MS [facility] [address] [place],  [place]  [address] Phone: [phone] Fax: [phone]

## 2023-05-25 NOTE — Progress Notes (Signed)
PROGRESS NOTE  Tamara [name] ZOX:[id number] DOB: 1934/05/26 DOA: 05/23/2023 PCP: [name], MD  Brief History:  89 year old female with a history of dementia, diabetes mellitus, hypertension, right breast cancer, and B12 deficiency, Fe deficiency, aortic valve stenosis presented as a transfer from [facility] secondary to right knee pain and swelling.  Per report, patient sustained a hyperflexion injury to the right knee when her leg came out of the wheelchair while daughter was pushing her up the ramp.  She noted swelling of the right knee as well as the right upper tibia-fibula area.  The patient was taken to [facility] emergency department.  X-ray showed an acute right proximal tibial metaphyseal fracture.  Orthopedics, Dr. Yevette Edwards was consulted.  He recommended transfer the patient to [facility].  Notably, the patient had a recent hospital admission from 03/23/2023 to 03/26/2023 secondary to acute respiratory failure from pneumonia.  The patient was treated with ceftriaxone and azithromycin.  She was discharged home with cephalexin and azithromycin and prednisone.  She was discharged to [facility] health care SNF.  She was weaned off oxygen at the time of discharge.  She had a prior hospital mission from 11/21/2022 to 11/25/2022 when she was treated for right distal femur fracture.  A supracondylar nail was placed by Dr. Dallas Schimke on 11/22/2022.  She was subsequently discharged to skilled nursing facility.   Assessment/Plan: Closed fracture of right proximal tibia -Orthopedics, Dr. Dallas Schimke appreciated -ORIF 11/4--Dr. Dallas Schimke -Judicious opioids -anticipate SNF -NWB on RLE; knee immobilizer on at all times   Dementia without behavioral disturbance -Continue Aricept -pt had hospital delirium during last 2 hospitalizations -Able to answer simple questions, and for the most part recognizes family    Essential hypertension -amlodipine and losartan discontinued since her 11/2022 admission -BP  soft>>improved, remains well controlled off anti-HTN meds   Diabetes mellitus type 2, controlled -Not on any medications as an outpatient -10/08/22 hemoglobin A1c--5.9   Right breast cancer -Currently in remission -Status post lumpectomy and lymph node dissection August 2014 -Patient declined radiation therapy -s/p Aromasin October 2014, continued till October 2019.  -Follow-up Dr. Ellin Saba   Anemia chronic disease -baseline Hgb ~10               Family Communication:   daughter udpated 11/5   Consultants:  ortho   Code Status:  DNR--confirmed with daughter   DVT Prophylaxis:  restarting [place] heparin on POD#1     Procedures: As Listed in Progress Note Above   Antibiotics: None         Subjective: Patient denies fevers, chills, headache, chest pain, dyspnea, nausea, vomiting, diarrhea, abdominal pain,   Objective: Vitals:   05/25/23 0419 05/25/23 0808 05/25/23 0933 05/25/23 1247  BP: 105/84 (!) 96/58 (!) 92/54 (!) 95/51  Pulse: 84 81 85 78  Resp: 20 18  17   Temp: 99.2 F (37.3 C) 99.1 F (37.3 C)  97.7 F (36.5 C)  TempSrc: Oral Axillary  Axillary  SpO2: 94% 99%  98%  Weight:        Intake/Output Summary (Last 24 hours) at 05/25/2023 1735 Last data filed at 05/25/2023 1300 Gross per 24 hour  Intake 868.29 ml  Output 350 ml  Net 518.29 ml   Weight change:  Exam:  General:  Pt is alert, follows commands appropriately, not in acute distress HEENT: No icterus, No thrush, No neck mass, [place]/AT Cardiovascular: RRR, S1/S2, no rubs, no gallops Respiratory: bibasilar crackles.  No wheeze Abdomen: Soft/+BS, non tender, non distended, no guarding Extremities: No edema, No lymphangitis, No petechiae, No rashes, no synovitis   Data Reviewed: I have personally reviewed following labs and imaging studies Basic Metabolic Panel: Recent Labs  Lab 05/23/23 0635 05/24/23 0828 05/25/23 0433  NA 141 144 143  K 3.8 3.8 3.7  CL 112* 112* 113*  CO2 21* 20*  22  GLUCOSE 142* 105* 130*  BUN 35* 31* 32*  CREATININE 1.03* 0.99 1.09*  CALCIUM 7.9* 7.9* 7.4*  MG 2.1  --  2.2  PHOS 3.7  --   --    Liver Function Tests: Recent Labs  Lab 05/23/23 0635  AST 16  ALT 17  ALKPHOS 66  BILITOT 1.0  PROT 6.3*  ALBUMIN 3.1*   No results for input(s): "LIPASE", "AMYLASE" in the last 168 hours. No results for input(s): "AMMONIA" in the last 168 hours. Coagulation Profile: No results for input(s): "INR", "PROTIME" in the last 168 hours. CBC: Recent Labs  Lab 05/23/23 0635 05/24/23 0828 05/25/23 0433  WBC 7.8 7.8 8.0  HGB 9.3* 8.5* 7.7*  HCT 28.4* 26.1* 24.6*  MCV 95.0 97.0 98.4  PLT 178 200 206   Cardiac Enzymes: No results for input(s): "CKTOTAL", "CKMB", "CKMBINDEX", "TROPONINI" in the last 168 hours. BNP: Invalid input(s): "POCBNP" CBG: Recent Labs  Lab 05/24/23 1302  GLUCAP 100*   HbA1C: No results for input(s): "HGBA1C" in the last 72 hours. Urine analysis:    Component Value Date/Time   COLORURINE YELLOW 11/22/2022 1200   APPEARANCEUR TURBID (A) 11/22/2022 1200   LABSPEC 1.015 11/22/2022 1200   PHURINE 5.0 11/22/2022 1200   GLUCOSEU NEGATIVE 11/22/2022 1200   HGBUR SMALL (A) 11/22/2022 1200   BILIRUBINUR NEGATIVE 11/22/2022 1200   KETONESUR NEGATIVE 11/22/2022 1200   PROTEINUR 100 (A) 11/22/2022 1200   NITRITE NEGATIVE 11/22/2022 1200   LEUKOCYTESUR SMALL (A) 11/22/2022 1200   Sepsis Labs: @LABRCNTIP (procalcitonin:4,lacticidven:4) )No results found for this or any previous visit (from the past 240 hour(s)).   Scheduled Meds:  donepezil  10 mg Oral QHS   risperiDONE  0.5 mg Oral QHS   sertraline  50 mg Oral QHS   Continuous Infusions:  Procedures/Studies: DG Tibia/Fibula Right  Result Date: 05/24/2023 CLINICAL DATA:  Plate and screw fixation proximal tibial fracture EXAM: RIGHT TIBIA AND FIBULA - 2 VIEW COMPARISON:  Tibia/fibula radiographs 2 days prior FINDINGS: Six C-arm fluoroscopic images were obtained  intraoperatively and submitted for post operative interpretation. The initial image demonstrates similar alignment of the proximal tibial fracture. Subsequent images demonstrate sideplate and screw fixation of the fracture with improved alignment. There is no evidence of immediate complication. Fluoro time 35 seconds, dose 2.6 mGy. Please see the performing provider's procedural report for further detail. IMPRESSION: Intraoperative images during sideplate and screw fixation of a proximal tibial fracture as above. Electronically Signed   By: Lesia Hausen M.D.   On: 05/24/2023 15:07   DG Tibia/Fibula Right Port  Result Date: 05/24/2023 CLINICAL DATA:  Closed fracture of tibia EXAM: PORTABLE RIGHT TIBIA AND FIBULA - 2 VIEW COMPARISON:  Same-day intraoperative images, tibia/fibula radiographs 2 days prior FINDINGS: There has been interval sideplate and screw fixation of the proximal tibia with improved alignment of the comminuted fracture. Hardware alignment is within expected limits, without evidence of complication. Postsurgical changes are also noted in the distal femur. Severe joint space narrowing of the lateral compartment in the knee is noted. IMPRESSION: Improved alignment of the comminuted proximal tibial fracture  following sideplate and screw fixation. Electronically Signed   By: Lesia Hausen M.D.   On: 05/24/2023 15:05   DG C-Arm 1-60 Min-No Report  Result Date: 05/24/2023 Fluoroscopy was utilized by the requesting physician.  No radiographic interpretation.    Catarina Hartshorn, DO  [facility]  If 7PM-7AM, please contact night-coverage [email or web address] Password [email or web address] 05/25/2023, 5:35 PM   LOS: 2 days

## 2023-05-25 NOTE — TOC Progression Note (Signed)
Transition of Care [facility]) - Progression Note    Patient Details  Name: Tamara [name] MRN: [id number] Date of Birth: [birth year]  [facility] CM/SW Contact  [place], [place] Phone Number: 05/25/2023, 11:09 AM  Clinical Narrative:    CSW updated that PT is recommending SNF for pt at D/C. CSW spoke with pts daughter who confirms they are still agreeable to SNF at [facility] if there is a bed available. CSW to complete referral and send out for review. Insurance Berkley Harvey will be started once PT note is in the chart. TOC to follow.   Expected Discharge Plan: Skilled Nursing Facility Barriers to Discharge: Continued Medical Work up  Expected Discharge Plan and Services In-house Referral: Clinical Social Work Discharge Planning Services: CM Consult Post Acute Care Choice: Skilled Nursing Facility Living arrangements for the past 2 months: Single Family Home                                       Social Determinants of Health (SDOH) Interventions SDOH Screenings   Food Insecurity: No Food Insecurity (03/23/2023)  Housing: Low Risk  (03/23/2023)  Transportation Needs: No Transportation Needs (04/02/2023)   [facility] from [facility]  Utilities: Not At Risk (03/23/2023)  Depression (PHQ2-9): Low Risk  (10/14/2022)  Tobacco Use: Medium Risk (05/24/2023)  Health Literacy: High Risk (04/02/2023)   Received from [facility]    Readmission Risk Interventions    05/24/2023    2:06 PM 11/23/2022   10:51 AM  Readmission Risk Prevention Plan  Transportation Screening Complete Complete  PCP or Specialist Appt within 5-7 Days  Not Complete  Home Care Screening  Complete  Medication Review (RN CM)  Complete  HRI or Home Care Consult Complete   Social Work Consult for Recovery Care Planning/Counseling Complete   Palliative Care Screening Not Applicable   Medication Review Oceanographer) Complete

## 2023-05-25 NOTE — NC FL2 (Signed)
[place] MEDICAID FL2 LEVEL OF CARE FORM     IDENTIFICATION  Patient Name: Tamara [name] Birthdate: Mar 15, 1934 Sex: female Admission Date (Current Location): 05/23/2023  [facility] and [place] Number:  [employer] and Address:  [facility]  [address] [place] [address]      Provider Number: [id number]  Attending Physician Name and Address:  Catarina Hartshorn, MD  Relative Name and Phone Number:  Matthias Hughs (Daughter)  [phone]    Current Level of Care: Hospital Recommended Level of Care: Skilled Nursing Facility Prior Approval Number:    Date Approved/Denied:   PASRR Number: [id number]  Discharge Plan: SNF    Current Diagnoses: Patient Active Problem List   Diagnosis Date Noted   Closed fracture of right proximal tibia 05/23/2023   Laceration of left upper extremity 05/23/2023   History of type 2 diabetes mellitus 05/23/2023   Acute hypoxic respiratory failure (HCC) 03/23/2023   Closed comminuted intra-articular fracture of distal femur, right, with delayed healing, subsequent encounter 11/21/2022   Closed displaced comminuted fracture of shaft of right femur (HCC) 11/21/2022   Iron deficiency 10/27/2022   Aortic atherosclerosis (HCC) 10/19/2022   Hypertension associated with type 2 diabetes mellitus (HCC) 10/19/2022   Cystitis with hematuria 10/11/2022   Postoperative fever 10/10/2022   Closed fracture of right hip (HCC) 10/08/2022   Dementia without behavioral disturbance (HCC) 10/08/2022   Fall at home, initial encounter 10/08/2022   Aortic valve stenosis, nonrheumatic 03/24/2022   Pre-diabetes 03/24/2022   Disorder of mitral valve 03/24/2022   Essential (primary) hypertension 03/24/2022   Fibrosis lung (HCC) 03/24/2022   Acute blood loss anemia 12/15/2016   B12 deficiency 06/08/2016   Breast CA (HCC) 06/26/2014   Malignant neoplasm of upper-outer quadrant of RIGHT female breast (HCC) 04/25/2013   Osteoporosis 08/10/2007     Orientation RESPIRATION BLADDER Height & Weight     Self, Place  Normal Continent Weight: 141 lb 15.6 oz (64.4 kg) Height:     BEHAVIORAL SYMPTOMS/MOOD NEUROLOGICAL BOWEL NUTRITION STATUS      Continent Diet (Regular)  AMBULATORY STATUS COMMUNICATION OF NEEDS Skin   Extensive Assist Verbally Normal                       Personal Care Assistance Level of Assistance  Bathing, Feeding, Dressing Bathing Assistance: Limited assistance Feeding assistance: Independent Dressing Assistance: Limited assistance     Functional Limitations Info  Sight, Hearing, Speech Sight Info: Adequate Hearing Info: Adequate Speech Info: Adequate    SPECIAL CARE FACTORS FREQUENCY  PT (By licensed PT), OT (By licensed OT)     PT Frequency: 5 times weekly OT Frequency: 5 times weekly            Contractures Contractures Info: Not present    Additional Factors Info  Code Status, Allergies Code Status Info: DNR (pre-arrest interventions desired) Allergies Info: Tramadol           Current Medications (05/25/2023):  This is the current hospital active medication list Current Facility-Administered Medications  Medication Dose Route Frequency Provider Last Rate Last Admin   acetaminophen (TYLENOL) tablet 650 mg  650 mg Oral Q6H PRN Oliver Barre, MD   650 mg at 05/24/23 2209   Or   acetaminophen (TYLENOL) suppository 650 mg  650 mg Rectal Q6H PRN Oliver Barre, MD       donepezil (ARICEPT) tablet 10 mg  10 mg Oral QHS Oliver Barre, MD  10 mg at 05/24/23 2211   HYDROcodone-acetaminophen (NORCO/VICODIN) 5-325 MG per tablet 1 tablet  1 tablet Oral Q6H PRN Catarina Hartshorn, MD   1 tablet at 05/25/23 0938   ondansetron (ZOFRAN) tablet 4 mg  4 mg Oral Q6H PRN Oliver Barre, MD       Or   ondansetron [facility]) injection 4 mg  4 mg Intravenous Q6H PRN Oliver Barre, MD   4 mg at 05/24/23 1145   risperiDONE (RISPERDAL) tablet 0.5 mg  0.5 mg Oral QHS Oliver Barre, MD   0.5 mg at 05/24/23 2211    sertraline (ZOLOFT) tablet 50 mg  50 mg Oral QHS Oliver Barre, MD   50 mg at 05/24/23 2211     Discharge Medications: Please see discharge summary for a list of discharge medications.  Relevant Imaging Results:  Relevant Lab Results:   Additional Information SSN: [id number] [place]

## 2023-05-25 NOTE — Plan of Care (Signed)
This pt has dementia. ORIF performed on pt 05/24/2023 Pt tolerated procedure well. Voiced no complaints of pain or discomfort during night shift. Pt progressing. Will be discharged to SNF for rehab once a facility has been approved - or pt will return home to family with a HH provided.

## 2023-05-25 NOTE — Plan of Care (Signed)
  Problem: Acute Rehab PT Goals(only PT should resolve) Goal: Pt Will Go Supine/Side To Sit Outcome: Progressing Flowsheets (Taken 05/25/2023 1400) Pt will go Supine/Side to Sit: with moderate assist Goal: Patient Will Transfer Sit To/From Stand Outcome: Progressing Flowsheets (Taken 05/25/2023 1400) Patient will transfer sit to/from stand: with moderate assist Goal: Pt Will Transfer Bed To Chair/Chair To Bed Outcome: Progressing Flowsheets (Taken 05/25/2023 1400) Pt will Transfer Bed to Chair/Chair to Bed:  with mod assist  with max assist Goal: Pt Will Ambulate Outcome: Progressing Flowsheets (Taken 05/25/2023 1400) Pt will Ambulate:  10 feet  with moderate assist  with maximum assist  with rolling walker   2:01 PM, 05/25/23 Ocie Bob, MPT Physical Therapist with [facility] [phone] office [phone] mobile phone

## 2023-05-26 DIAGNOSIS — Z8639 Personal history of other endocrine, nutritional and metabolic disease: Secondary | ICD-10-CM | POA: Diagnosis not present

## 2023-05-26 DIAGNOSIS — I10 Essential (primary) hypertension: Secondary | ICD-10-CM | POA: Diagnosis not present

## 2023-05-26 DIAGNOSIS — S82101A Unspecified fracture of upper end of right tibia, initial encounter for closed fracture: Secondary | ICD-10-CM | POA: Diagnosis not present

## 2023-05-26 DIAGNOSIS — F0390 Unspecified dementia without behavioral disturbance: Secondary | ICD-10-CM | POA: Diagnosis not present

## 2023-05-26 LAB — CBC
HCT: 24.5 % — ABNORMAL LOW (ref 36.0–46.0)
Hemoglobin: 7.7 g/dL — ABNORMAL LOW (ref 12.0–15.0)
MCH: 30.7 pg (ref 26.0–34.0)
MCHC: 31.4 g/dL (ref 30.0–36.0)
MCV: 97.6 fL (ref 80.0–100.0)
Platelets: 217 10*3/uL (ref 150–400)
RBC: 2.51 MIL/uL — ABNORMAL LOW (ref 3.87–5.11)
RDW: 14.1 % (ref 11.5–15.5)
WBC: 8.2 10*3/uL (ref 4.0–10.5)
nRBC: 0 % (ref 0.0–0.2)

## 2023-05-26 LAB — MAGNESIUM: Magnesium: 2.2 mg/dL (ref 1.7–2.4)

## 2023-05-26 LAB — BASIC METABOLIC PANEL
Anion gap: 7 (ref 5–15)
BUN: 27 mg/dL — ABNORMAL HIGH (ref 8–23)
CO2: 23 mmol/L (ref 22–32)
Calcium: 7.4 mg/dL — ABNORMAL LOW (ref 8.9–10.3)
Chloride: 112 mmol/L — ABNORMAL HIGH (ref 98–111)
Creatinine, Ser: 0.98 mg/dL (ref 0.44–1.00)
GFR, Estimated: 56 mL/min — ABNORMAL LOW (ref >60)
Glucose, Bld: 124 mg/dL — ABNORMAL HIGH (ref 70–99)
Potassium: 3.5 mmol/L (ref 3.5–5.1)
Sodium: 142 mmol/L (ref 135–145)

## 2023-05-26 NOTE — TOC Progression Note (Addendum)
Transition of Care [facility]) - Progression Note    Patient Details  Name: Tamara [name] MRN: [id number] Date of Birth: 12-02-1933  Transition of [facility] CM/SW Contact  Villa Herb, [place] Phone Number: 05/26/2023, 1:17 PM  Clinical Narrative:    CSW spoke with Tammy at [facility] to update facility choice of [facility]. Tammy states [facility] has been sent for medical review. TOC to follow.  Addendum 2:50pm: CSW updated by Tammy with [facility] that pts insurance auth has been approved for SNF at [facility]. SN[facility] is [id number] and EMS [facility] is [id number]. Daughter updated. Destiny with [facility] states they can accept pt to SNF tomorrow. TOC to follow.     Expected Discharge Plan: Skilled Nursing Facility Barriers to Discharge: Continued Medical Work up  Expected Discharge Plan and Services In-house Referral: Clinical Social Work Discharge Planning Services: CM Consult Post Acute Care Choice: Skilled Nursing Facility Living arrangements for the past 2 months: Single Family Home                                       Social Determinants of Health (SDOH) Interventions SDOH Screenings   Food Insecurity: No Food Insecurity (03/23/2023)  Housing: Low Risk  (03/23/2023)  Transportation Needs: No Transportation Needs (04/02/2023)   Received from [facility]  Utilities: Not At Risk (03/23/2023)  Depression (PHQ2-9): Low Risk  (10/14/2022)  Tobacco Use: Medium Risk (05/24/2023)  Health Literacy: High Risk (04/02/2023)   Received from [facility]    Readmission Risk Interventions    05/24/2023    2:06 PM 11/23/2022   10:51 AM  Readmission Risk Prevention Plan  Transportation Screening Complete Complete  PCP or Specialist Appt within 5-7 Days  Not Complete  Home Care Screening  Complete  Medication Review (RN CM)  Complete  HRI or Home Care Consult Complete   Social Work Consult for Recovery Care Planning/Counseling Complete   Palliative Care Screening Not Applicable   Medication Review Special educational needs teacher) Complete

## 2023-05-26 NOTE — Progress Notes (Signed)
PROGRESS NOTE  Tamara [name] QMV:[id number] DOB: [birth year] DOA: 05/23/2023 PCP: [name], MD  Brief History:  89 year old female with a history of dementia, diabetes mellitus, hypertension, right breast cancer, and B12 deficiency, Fe deficiency, aortic valve stenosis presented as a transfer from [facility] secondary to right knee pain and swelling.  Per report, patient sustained a hyperflexion injury to the right knee when her leg came out of the wheelchair while daughter was pushing her up the ramp.  She noted swelling of the right knee as well as the right upper tibia-fibula area.  The patient was taken to [facility] emergency department.  X-ray showed an acute right proximal tibial metaphyseal fracture.  Orthopedics, Dr. Yevette Edwards was consulted.  He recommended transfer the patient to [facility].  Notably, the patient had a recent hospital admission from 03/23/2023 to 03/26/2023 secondary to acute respiratory failure from pneumonia.  The patient was treated with ceftriaxone and azithromycin.  She was discharged home with cephalexin and azithromycin and prednisone.  She was discharged to [facility] SNF.  She was weaned off oxygen at the time of discharge.  She had a prior hospital mission from 11/21/2022 to 11/25/2022 when she was treated for right distal femur fracture.  A supracondylar nail was placed by Dr. Dallas Schimke on 11/22/2022.  She was subsequently discharged to skilled nursing facility.   Assessment/Plan: Closed fracture of right proximal tibia -Orthopedics, Dr. Dallas Schimke appreciated -ORIF 11/4--Dr. Dallas Schimke -Judicious opioids -awaiting authorization for SNF -NWB on RLE; knee immobilizer on at all times   Dementia without behavioral disturbance -Continue Aricept -pt had hospital delirium during last 2 hospitalizations -Able to answer simple questions, and for the most part recognizes family    Essential hypertension -amlodipine and losartan discontinued since her 11/2022  admission -BP soft>>improved, remains well controlled off anti-HTN meds   Diabetes mellitus type 2, controlled -Not on any medications as an outpatient -10/08/22 hemoglobin A1c--5.9   Right breast cancer -Currently in remission -Status post lumpectomy and lymph node dissection August 2014 -Patient declined radiation therapy -s/p Aromasin October 2014, continued till October 2019.  -Follow-up Dr. Ellin Saba   Anemia chronic disease -baseline Hgb ~10     Family Communication:   daughter udpated 11/5   Consultants:  ortho   Code Status:  DNR--confirmed with daughter   DVT Prophylaxis:  [place] heparin     Procedures: As Listed in Progress Note Above   Antibiotics: None    Subjective: Pt agreeable to SNF rehab placement    Objective: Vitals:   05/25/23 0933 05/25/23 1247 05/25/23 2248 05/26/23 1259  BP: (!) 92/54 (!) 95/51 (!) 92/46 (!) 105/53  Pulse: 85 78 90 74  Resp:  17 18 20   Temp:  97.7 F (36.5 C) 98.2 F (36.8 C) 99 F (37.2 C)  TempSrc:  Axillary Oral Oral  SpO2:  98% 95% 96%  Weight:        Intake/Output Summary (Last 24 hours) at 05/26/2023 1451 Last data filed at 05/26/2023 0900 Gross per 24 hour  Intake 540 ml  Output 1050 ml  Net -510 ml   Weight change:  Exam:  General:  awake, alert, sitting up in chair, no distress;  HEENT: neck supple, NCAT, EOMI.  Cardiovascular: normal s1, s2 sounds, no m/r/g Respiratory: rare expiratory wheeze heard. Abdomen: soft, ND/NT, no HSM.  Extremities: warm extremities   Data Reviewed: I have personally reviewed following labs and imaging studies Basic Metabolic Panel: Recent Labs  Lab 05/23/23  3474 05/24/23 0828 05/25/23 0433 05/26/23 0436  NA 141 144 143 142  K 3.8 3.8 3.7 3.5  CL 112* 112* 113* 112*  CO2 21* 20* 22 23  GLUCOSE 142* 105* 130* 124*  BUN 35* 31* 32* 27*  CREATININE 1.03* 0.99 1.09* 0.98  CALCIUM 7.9* 7.9* 7.4* 7.4*  MG 2.1  --  2.2 2.2  PHOS 3.7  --   --   --    Liver Function  Tests: Recent Labs  Lab 05/23/23 0635  AST 16  ALT 17  ALKPHOS 66  BILITOT 1.0  PROT 6.3*  ALBUMIN 3.1*   No results for input(s): "LIPASE", "AMYLASE" in the last 168 hours. No results for input(s): "AMMONIA" in the last 168 hours. Coagulation Profile: No results for input(s): "INR", "PROTIME" in the last 168 hours. CBC: Recent Labs  Lab 05/23/23 0635 05/24/23 0828 05/25/23 0433 05/26/23 0436  WBC 7.8 7.8 8.0 8.2  HGB 9.3* 8.5* 7.7* 7.7*  HCT 28.4* 26.1* 24.6* 24.5*  MCV 95.0 97.0 98.4 97.6  PLT 178 200 206 217   Cardiac Enzymes: No results for input(s): "CKTOTAL", "CKMB", "CKMBINDEX", "TROPONINI" in the last 168 hours. BNP: Invalid input(s): "POCBNP" CBG: Recent Labs  Lab 05/24/23 1302  GLUCAP 100*   HbA1C: No results for input(s): "HGBA1C" in the last 72 hours. Urine analysis:    Component Value Date/Time   COLORURINE YELLOW 11/22/2022 1200   APPEARANCEUR TURBID (A) 11/22/2022 1200   LABSPEC 1.015 11/22/2022 1200   PHURINE 5.0 11/22/2022 1200   GLUCOSEU NEGATIVE 11/22/2022 1200   HGBUR SMALL (A) 11/22/2022 1200   BILIRUBINUR NEGATIVE 11/22/2022 1200   KETONESUR NEGATIVE 11/22/2022 1200   PROTEINUR 100 (A) 11/22/2022 1200   NITRITE NEGATIVE 11/22/2022 1200   LEUKOCYTESUR SMALL (A) 11/22/2022 1200   No results found for this or any previous visit (from the past 240 hour(s)).   Scheduled Meds:  donepezil  10 mg Oral QHS   heparin injection (subcutaneous)  5,000 Units Subcutaneous Q8H   risperiDONE  0.5 mg Oral QHS   sertraline  50 mg Oral QHS   Continuous Infusions:  Procedures/Studies: DG Tibia/Fibula Right  Result Date: 05/24/2023 CLINICAL DATA:  Plate and screw fixation proximal tibial fracture EXAM: RIGHT TIBIA AND FIBULA - 2 VIEW COMPARISON:  Tibia/fibula radiographs 2 days prior FINDINGS: Six C-arm fluoroscopic images were obtained intraoperatively and submitted for post operative interpretation. The initial image demonstrates similar alignment  of the proximal tibial fracture. Subsequent images demonstrate sideplate and screw fixation of the fracture with improved alignment. There is no evidence of immediate complication. Fluoro time 35 seconds, dose 2.6 mGy. Please see the performing provider's procedural report for further detail. IMPRESSION: Intraoperative images during sideplate and screw fixation of a proximal tibial fracture as above. Electronically Signed   By: Lesia Hausen M.D.   On: 05/24/2023 15:07   DG Tibia/Fibula Right Port  Result Date: 05/24/2023 CLINICAL DATA:  Closed fracture of tibia EXAM: PORTABLE RIGHT TIBIA AND FIBULA - 2 VIEW COMPARISON:  Same-day intraoperative images, tibia/fibula radiographs 2 days prior FINDINGS: There has been interval sideplate and screw fixation of the proximal tibia with improved alignment of the comminuted fracture. Hardware alignment is within expected limits, without evidence of complication. Postsurgical changes are also noted in the distal femur. Severe joint space narrowing of the lateral compartment in the knee is noted. IMPRESSION: Improved alignment of the comminuted proximal tibial fracture following sideplate and screw fixation. Electronically Signed   By: Selena Lesser.D.  On: 05/24/2023 15:05   DG C-Arm 1-60 Min-No Report  Result Date: 05/24/2023 Fluoroscopy was utilized by the requesting physician.  No radiographic interpretation.    Standley Dakins, MD  [facility]  If 7PM-7AM, please contact night-coverage [email or web address] Password [email or web address] 05/26/2023, 2:51 PM   LOS: 3 days

## 2023-05-26 NOTE — Evaluation (Signed)
Occupational Therapy Evaluation Patient Details Name: Tamara [name] MRN: [id number] DOB: 24-Aug-1933 Today's Date: 05/26/2023   History of Present Illness Tamara [name] is a 89 y.o. female s/p ORIF of right proximal tibia fracture on 05/24/23 with medical history significant of hypertension, type 2 diabetes mellitus, dementia, right breast cancer, aortic valve stenosis, lung fibrosis presents to [facility] ED due to right knee pain and swelling.  Patient was unable to provide history possibly due to underlying dementia, history was obtained from ED medical record.  Per report, patient sustained a hyperflexion injury to the right knee when the leg came on the wheelchair while daughter was pushing her up the ramp.  She noted swelling of the right knee as well as the right upper tibia-fibula area.  There was no report of chest pain, shortness of breath, fever, chills, nausea or vomiting.     Of note, earlier in the day, patient presents to [facility] [place] urgent care for evaluation of left forearm laceration which occurred when accidentally brushed against a nail sticking out of the wall.  Pressure dressing was applied prior to arrival to the urgent care with bleeding being controlled and no numbness, tingling or decreased range of motion.  Pressure dressing was applied at the urgent care due to patient's preference, Hibiclens and mupirocin ordered for home wound care with instructions.     Patient was admitted from 9/3 to 03/26/2023 due to community acquired pneumonia of left lower lobe of lung and acute hypoxic respiratory failure and was treated with IV Rocephin and azithromycin, she was weaned off supplemental oxygen and discharged home on Keflex, azithromycin and prednisone.     She was also admitted from 5//24 to 11/25/2022 due to right distal femur fracture repaired by Dr. Dallas Schimke on 11/22/2022 with supracondylar nail placement and she was discharged to SNF at that time for rehabilitation.   Clinical  Impression   Pt appeared confused but was able to participate in OT evaluation with PT co-treatment. Pt is assisted for bathing and dressing at baseline. B UE were difficult to assess due to pt's cognitive status. Pt required max A for bed mobility and transfer to chair. No family present to discuss other baseline function or cognitive status. Pt left in the chair with chair alarm set and call bell within reach. Pt will benefit from continued OT in the hospital and recommended venue below to increase strength, balance, and endurance for safe ADL's.          If plan is discharge home, recommend the following: A lot of help with bathing/dressing/bathroom;A lot of help with walking and/or transfers;Assistance with cooking/housework;Assistance with feeding;Assist for transportation;Help with stairs or ramp for entrance;Direct supervision/assist for medications management;Supervision due to cognitive status    Functional Status Assessment  Patient has had a recent decline in their functional status and demonstrates the ability to make significant improvements in function in a reasonable and predictable amount of time.  Equipment Recommendations  None recommended by OT           Precautions / Restrictions Precautions Precautions: Fall Restrictions Weight Bearing Restrictions: Yes RLE Weight Bearing: Non weight bearing      Mobility Bed Mobility Overal bed mobility: Needs Assistance Bed Mobility: Supine to Sit     Supine to sit: Max assist     General bed mobility comments: limited engagmenet with sittup up; much assist    Transfers Overall transfer level: Needs assistance   Transfers: Sit to/from Stand, Bed to chair/wheelchair/BSC Sit  to Stand: Max assist Stand pivot transfers: Max assist         General transfer comment: Anxious; much assist; unable to use RW      Balance Overall balance assessment: Needs assistance Sitting-balance support: Feet unsupported, Bilateral  upper extremity supported Sitting balance-Leahy Scale: Fair Sitting balance - Comments: seated at EOB   Standing balance support: Bilateral upper extremity supported, During functional activity, Reliant on assistive device for balance Standing balance-Leahy Scale: Poor                             ADL either performed or assessed with clinical judgement   ADL Overall ADL's : Needs assistance/impaired Eating/Feeding: Minimal assistance;Sitting   Grooming: Minimal assistance;Moderate assistance;Sitting   Upper Body Bathing: Minimal assistance;Moderate assistance;Sitting   Lower Body Bathing: Maximal assistance;Sitting/lateral leans   Upper Body Dressing : Minimal assistance;Moderate assistance;Sitting   Lower Body Dressing: Maximal assistance;Sitting/lateral leans   Toilet Transfer: Maximal assistance;Stand-pivot Toilet Transfer Details (indicate cue type and reason): Simulated via EOB to chair Toileting- Clothing Manipulation and Hygiene: Maximal assistance;Sitting/lateral lean       Functional mobility during ADLs: Maximal assistance       Vision Baseline Vision/History: 0 No visual deficits Ability to See in Adequate Light: 0 Adequate Patient Visual Report: No change from baseline Vision Assessment?: No apparent visual deficits     Perception Perception: Not tested       Praxis Praxis: Not tested       Pertinent Vitals/Pain Pain Assessment Pain Assessment: Faces Faces Pain Scale: Hurts whole lot Pain Location: right RLE Pain Descriptors / Indicators: Grimacing, Guarding, Moaning Pain Intervention(s): Limited activity within patient's tolerance, Monitored during session, Repositioned     Extremity/Trunk Assessment Upper Extremity Assessment Upper Extremity Assessment: Generalized weakness;Difficult to assess due to impaired cognition   Lower Extremity Assessment Lower Extremity Assessment: Defer to PT evaluation   Cervical / Trunk  Assessment Cervical / Trunk Assessment: Normal   Communication Communication Communication: Difficulty communicating thoughts/reduced clarity of speech (some slurred speech and not making sense)   Cognition Arousal: Alert Behavior During Therapy: Anxious Overall Cognitive Status: No family/caregiver present to determine baseline cognitive functioning                                 General Comments: verbal and tactile cuing                      Home Living Family/patient expects to be discharged to:: Private residence Living Arrangements: Children Available Help at Discharge: Family;Personal care attendant;Other (Comment) Type of Home: House Home Access: Stairs to enter [employer] of Steps: 4 Entrance Stairs-Rails: Right Home Layout: Two level;Able to live on main level with bedroom/bathroom     Bathroom Shower/Tub: Producer, television/film/video: Standard Bathroom Accessibility: No   Home Equipment: Agricultural consultant (2 wheels);BSC/3in1;Shower seat - built in;Wheelchair - manual   Additional Comments: per PT note      Prior Functioning/Environment Prior Level of Function : Needs assist       Physical Assist : Mobility (physical);ADLs (physical)   ADLs (physical): Bathing;Dressing;IADLs Mobility Comments: [place]ous[place] ambulator with RW and assist. Assisted for bed mobility. ADLs Comments: Assist for bathing, dressing, and IADL's. No family present to discuss level of support.        OT Problem List: Decreased strength;Decreased range of motion;Decreased activity  tolerance;Impaired balance (sitting and/or standing);Decreased cognition;Decreased safety awareness;Pain      OT Treatment/Interventions: Self-care/ADL training;Therapeutic exercise;DME and/or AE instruction;Therapeutic activities;Cognitive remediation/compensation;Patient/family education    OT Goals(Current goals can be found in the care plan section) Acute Rehab OT  Goals Patient Stated Goal: return home OT Goal Formulation: With patient Time For Goal Achievement: 06/09/23 Potential to Achieve Goals: Good  OT Frequency: Min 2X/week    Co-evaluation PT/OT/SLP Co-Evaluation/Treatment: Yes Reason for Co-Treatment: Complexity of the patient's impairments (multi-system involvement)   OT goals addressed during session: ADL's and self-care                       End of Session Equipment Utilized During Treatment: Rolling walker (2 wheels)  Activity Tolerance: Patient tolerated treatment well Patient left: in chair;with call bell/phone within reach;with chair alarm set  OT Visit Diagnosis: Unsteadiness on feet (R26.81);Other abnormalities of gait and mobility (R26.89);Muscle weakness (generalized) (M62.81);Other symptoms and signs involving cognitive function                Time: 3244-0102 OT Time Calculation (min): 15 min Charges:  OT General Charges $OT Visit: 1 Visit OT Evaluation $OT Eval Low Complexity: 1 Low  Andreyah Natividad OT, MOT   Danie Chandler 05/26/2023, 9:06 AM

## 2023-05-26 NOTE — Plan of Care (Signed)
  Problem: Education: Goal: Knowledge of General Education information will improve Description: Including pain rating scale, medication(s)/side effects and non-pharmacologic comfort measures Outcome: Progressing   Problem: Health Behavior/Discharge Planning: Goal: Ability to manage health-related needs will improve Outcome: Progressing   Problem: Clinical Measurements: Goal: Ability to maintain clinical measurements within normal limits will improve Outcome: Progressing   Problem: Elimination: Goal: Will not experience complications related to urinary retention Outcome: Progressing   Problem: Pain Management: Goal: General experience of comfort will improve Outcome: Progressing

## 2023-05-26 NOTE — Progress Notes (Signed)
Physical Therapy Treatment Patient Details Name: Tamara [name] MRN: [id number] DOB: 03-31-1934 Today's Date: 05/26/2023   History of Present Illness Tamara [name] is a 89 y.o. female s/p ORIF of right proximal tibia fracture on 05/24/23 with medical history significant of hypertension, type 2 diabetes mellitus, dementia, right breast cancer, aortic valve stenosis, lung fibrosis presents to [facility] ED due to right knee pain and swelling.  Patient was unable to provide history possibly due to underlying dementia, history was obtained from ED medical record.  Per report, patient sustained a hyperflexion injury to the right knee when the leg came on the wheelchair while daughter was pushing her up the ramp.  She noted swelling of the right knee as well as the right upper tibia-fibula area.  There was no report of chest pain, shortness of breath, fever, chills, nausea or vomiting.     Of note, earlier in the day, patient presents to [facility] urgent care for evaluation of left forearm laceration which occurred when accidentally brushed against a nail sticking out of the wall.  Pressure dressing was applied prior to arrival to the urgent care with bleeding being controlled and no numbness, tingling or decreased range of motion.  Pressure dressing was applied at the urgent care due to patient's preference, Hibiclens and mupirocin ordered for home wound care with instructions.     Patient was admitted from 9/3 to 03/26/2023 due to community acquired pneumonia of left lower lobe of lung and acute hypoxic respiratory failure and was treated with IV Rocephin and azithromycin, she was weaned off supplemental oxygen and discharged home on Keflex, azithromycin and prednisone.     She was also admitted from 5//24 to 11/25/2022 due to right distal femur fracture repaired by Dr. Dallas Schimke on 11/22/2022 with supracondylar nail placement and she was discharged to SNF at that time for rehabilitation.    PT Comments  Patient  agreeable for therapy after encouragement.  Patient demonstrates slow labored movement for sitting up at bedside with poor tolerance for moving RLE due to increasing pain, unable to maintain NWB on RLE when attempting to stand using RW and required Max assist stand pivot with left knee blocked to transfers to chair - nursing staff notified.  Patient will benefit from continued skilled physical therapy in hospital and recommended venue below to increase strength, balance, endurance for safe ADLs and gait.      If plan is discharge home, recommend the following: A lot of help with bathing/dressing/bathroom;A lot of help with walking and/or transfers;Help with stairs or ramp for entrance;Assistance with cooking/housework   Can travel by private vehicle     No  Equipment Recommendations  None recommended by PT    Recommendations for Other Services       Precautions / Restrictions Precautions Precautions: Fall Restrictions Weight Bearing Restrictions: Yes RLE Weight Bearing: Non weight bearing     Mobility  Bed Mobility Overal bed mobility: Needs Assistance Bed Mobility: Sit to Supine     Supine to sit: Max assist     General bed mobility comments: slow labored movement with difficulty moving RLE due to increasing pain    Transfers Overall transfer level: Needs assistance Equipment used: 1 person hand held assist Transfers: Sit to/from Stand, Bed to chair/wheelchair/BSC Sit to Stand: Max assist Stand pivot transfers: Max assist         General transfer comment: patient demonstrated poor carryover for attempting to use RW while maintaining NWB RLE for transfers and required  Max assist stand pivot with left knee blocked    Ambulation/Gait                   Stairs             Wheelchair Mobility     Tilt Bed    Modified Rankin (Stroke Patients Only)       Balance Overall balance assessment: Needs assistance Sitting-balance support: Feet supported,  No upper extremity supported Sitting balance-Leahy Scale: Fair Sitting balance - Comments: seated at EOB   Standing balance support: Reliant on assistive device for balance, During functional activity, Bilateral upper extremity supported Standing balance-Leahy Scale: Poor Standing balance comment: stand pivot transfer, unable to transfer using RW                            Cognition Arousal: Alert Behavior During Therapy: Anxious Overall Cognitive Status: No family/caregiver present to determine baseline cognitive functioning                                 General Comments: verbal and tactile cuing        Exercises      General Comments        Pertinent Vitals/Pain Pain Assessment Pain Assessment: Faces Faces Pain Scale: Hurts whole lot Pain Location: right RLE Pain Descriptors / Indicators: Grimacing, Guarding, Moaning Pain Intervention(s): Limited activity within patient's tolerance, Monitored during session, Repositioned    Home Living Family/patient expects to be discharged to:: Private residence Living Arrangements: Children Available Help at Discharge: Family;Personal care attendant;Other (Comment) Type of Home: House Home Access: Stairs to enter Entrance Stairs-Rails: Right Entrance Stairs-Number of Steps: 4   Home Layout: Two level;Able to live on main level with bedroom/bathroom Home Equipment: Rolling Walker (2 wheels);BSC/3in1;Shower seat - built in;Wheelchair - manual Additional Comments: per PT note    Prior Function            PT Goals (current goals can now be found in the care plan section) Acute Rehab PT Goals Patient Stated Goal: return home PT Goal Formulation: With patient Time For Goal Achievement: 06/08/23 Potential to Achieve Goals: Fair Progress towards PT goals: Progressing toward goals    Frequency    Min 3X/week      PT Plan      Co-evaluation PT/OT/SLP Co-Evaluation/Treatment: Yes Reason for  Co-Treatment: Complexity of the patient's impairments (multi-system involvement) PT goals addressed during session: Mobility/safety with mobility;Balance;Proper use of DME OT goals addressed during session: ADL's and self-care      AM-PAC PT "6 Clicks" Mobility   Outcome Measure  Help needed turning from your back to your side while in a flat bed without using bedrails?: A Lot Help needed moving from lying on your back to sitting on the side of a flat bed without using bedrails?: A Lot Help needed moving to and from a bed to a chair (including a wheelchair)?: A Lot Help needed standing up from a chair using your arms (e.g., wheelchair or bedside chair)?: A Lot Help needed to walk in hospital room?: Total Help needed climbing 3-5 steps with a railing? : Total 6 Click Score: 10    End of Session   Activity Tolerance: Patient tolerated treatment well;Patient limited by fatigue;Patient limited by pain Patient left: in chair;with call bell/phone within reach Nurse Communication: Mobility status PT Visit Diagnosis: Unsteadiness on feet (R26.81);Other  abnormalities of gait and mobility (R26.89);Muscle weakness (generalized) (M62.81)     Time: 0802-0828 PT Time Calculation (min) (ACUTE ONLY): 26 min  Charges:    $Therapeutic Activity: 23-37 mins PT General Charges $$ ACUTE PT VISIT: 1 Visit                     11:40 AM, 05/26/23 Ocie Bob, MPT Physical Therapist with [facility] 336 [phone] office [phone] mobile phone

## 2023-05-26 NOTE — Plan of Care (Signed)
  Problem: Education: Goal: Knowledge of General Education information will improve Description: Including pain rating scale, medication(s)/side effects and non-pharmacologic comfort measures Outcome: Progressing   Problem: Health Behavior/Discharge Planning: Goal: Ability to manage health-related needs will improve Outcome: Progressing   Problem: Clinical Measurements: Goal: Ability to maintain clinical measurements within normal limits will improve Outcome: Progressing Goal: Will remain free from infection Outcome: Progressing Goal: Diagnostic test results will improve Outcome: Progressing   Problem: Elimination: Goal: Will not experience complications related to urinary retention Outcome: Progressing   Problem: Pain Management: Goal: General experience of comfort will improve Outcome: Progressing   Problem: Safety: Goal: Ability to remain free from injury will improve Outcome: Progressing   Problem: Skin Integrity: Goal: Risk for impaired skin integrity will decrease Outcome: Progressing

## 2023-05-26 NOTE — Plan of Care (Signed)
  Problem: Acute Rehab OT Goals (only OT should resolve) Goal: Pt. Will Perform Grooming Flowsheets (Taken 05/26/2023 0908) Pt Will Perform Grooming:  with modified independence  sitting Goal: Pt. Will Perform Upper Body Dressing Flowsheets (Taken 05/26/2023 0908) Pt Will Perform Upper Body Dressing:  with modified independence  sitting Goal: Pt. Will Perform Lower Body Dressing Flowsheets (Taken 05/26/2023 0908) Pt Will Perform Lower Body Dressing:  sitting/lateral leans  with mod assist Goal: Pt. Will Transfer To Toilet Flowsheets (Taken 05/26/2023 0908) Pt Will Transfer to Toilet:  with min assist  with mod assist  stand pivot transfer Goal: Pt. Will Perform Toileting-Clothing Manipulation Flowsheets (Taken 05/26/2023 0908) Pt Will Perform Toileting - Clothing Manipulation and hygiene: with min assist Goal: Pt/Caregiver Will Perform Home Exercise Program Flowsheets (Taken 05/26/2023 0908) Pt/caregiver will Perform Home Exercise Program:  Increased ROM  Increased strength  Both right and left upper extremity  Independently  Rodel Glaspy OT, MOT

## 2023-05-27 ENCOUNTER — Encounter (HOSPITAL_COMMUNITY): Payer: Self-pay | Admitting: Orthopedic Surgery

## 2023-05-27 DIAGNOSIS — F0390 Unspecified dementia without behavioral disturbance: Secondary | ICD-10-CM | POA: Diagnosis not present

## 2023-05-27 DIAGNOSIS — I10 Essential (primary) hypertension: Secondary | ICD-10-CM | POA: Diagnosis not present

## 2023-05-27 DIAGNOSIS — C50911 Malignant neoplasm of unspecified site of right female breast: Secondary | ICD-10-CM | POA: Diagnosis not present

## 2023-05-27 DIAGNOSIS — S72001D Fracture of unspecified part of neck of right femur, subsequent encounter for closed fracture with routine healing: Secondary | ICD-10-CM | POA: Diagnosis not present

## 2023-05-27 DIAGNOSIS — D638 Anemia in other chronic diseases classified elsewhere: Secondary | ICD-10-CM | POA: Diagnosis not present

## 2023-05-27 DIAGNOSIS — I350 Nonrheumatic aortic (valve) stenosis: Secondary | ICD-10-CM | POA: Diagnosis not present

## 2023-05-27 DIAGNOSIS — S41112D Laceration without foreign body of left upper arm, subsequent encounter: Secondary | ICD-10-CM | POA: Diagnosis not present

## 2023-05-27 DIAGNOSIS — E538 Deficiency of other specified B group vitamins: Secondary | ICD-10-CM | POA: Diagnosis not present

## 2023-05-27 DIAGNOSIS — R319 Hematuria, unspecified: Secondary | ICD-10-CM | POA: Diagnosis not present

## 2023-05-27 DIAGNOSIS — F32A Depression, unspecified: Secondary | ICD-10-CM | POA: Diagnosis not present

## 2023-05-27 DIAGNOSIS — E611 Iron deficiency: Secondary | ICD-10-CM | POA: Diagnosis not present

## 2023-05-27 DIAGNOSIS — D631 Anemia in chronic kidney disease: Secondary | ICD-10-CM | POA: Diagnosis not present

## 2023-05-27 DIAGNOSIS — N390 Urinary tract infection, site not specified: Secondary | ICD-10-CM | POA: Diagnosis not present

## 2023-05-27 DIAGNOSIS — D649 Anemia, unspecified: Secondary | ICD-10-CM | POA: Diagnosis not present

## 2023-05-27 DIAGNOSIS — M6281 Muscle weakness (generalized): Secondary | ICD-10-CM | POA: Diagnosis not present

## 2023-05-27 DIAGNOSIS — S82101D Unspecified fracture of upper end of right tibia, subsequent encounter for closed fracture with routine healing: Secondary | ICD-10-CM | POA: Diagnosis not present

## 2023-05-27 DIAGNOSIS — R2689 Other abnormalities of gait and mobility: Secondary | ICD-10-CM | POA: Diagnosis not present

## 2023-05-27 DIAGNOSIS — J8410 Pulmonary fibrosis, unspecified: Secondary | ICD-10-CM | POA: Diagnosis not present

## 2023-05-27 DIAGNOSIS — E119 Type 2 diabetes mellitus without complications: Secondary | ICD-10-CM | POA: Diagnosis not present

## 2023-05-27 DIAGNOSIS — F0150 Vascular dementia without behavioral disturbance: Secondary | ICD-10-CM | POA: Diagnosis not present

## 2023-05-27 DIAGNOSIS — S82111A Displaced fracture of right tibial spine, initial encounter for closed fracture: Secondary | ICD-10-CM | POA: Diagnosis not present

## 2023-05-27 DIAGNOSIS — M62561 Muscle wasting and atrophy, not elsewhere classified, right lower leg: Secondary | ICD-10-CM | POA: Diagnosis not present

## 2023-05-27 DIAGNOSIS — R262 Difficulty in walking, not elsewhere classified: Secondary | ICD-10-CM | POA: Diagnosis not present

## 2023-05-27 DIAGNOSIS — R531 Weakness: Secondary | ICD-10-CM | POA: Diagnosis not present

## 2023-05-27 DIAGNOSIS — Z8639 Personal history of other endocrine, nutritional and metabolic disease: Secondary | ICD-10-CM | POA: Diagnosis not present

## 2023-05-27 DIAGNOSIS — Z9181 History of falling: Secondary | ICD-10-CM | POA: Diagnosis not present

## 2023-05-27 DIAGNOSIS — U071 COVID-19: Secondary | ICD-10-CM | POA: Diagnosis not present

## 2023-05-27 DIAGNOSIS — M810 Age-related osteoporosis without current pathological fracture: Secondary | ICD-10-CM | POA: Diagnosis not present

## 2023-05-27 DIAGNOSIS — E1169 Type 2 diabetes mellitus with other specified complication: Secondary | ICD-10-CM | POA: Diagnosis not present

## 2023-05-27 MED ORDER — PANTOPRAZOLE SODIUM 40 MG PO TBEC: 40.0000 mg | DELAYED_RELEASE_TABLET | Freq: Every day | ORAL | Status: AC

## 2023-05-27 MED ORDER — ASPIRIN 81 MG PO TBEC: 81.0000 mg | DELAYED_RELEASE_TABLET | Freq: Two times a day (BID) | ORAL | Status: AC

## 2023-05-27 MED ORDER — DONEPEZIL HCL 10 MG PO TABS: 10.0000 mg | ORAL_TABLET | Freq: Every day | ORAL | Status: DC

## 2023-05-27 MED ORDER — RISPERIDONE 0.5 MG PO TABS: 0.5000 mg | ORAL_TABLET | Freq: Every day | ORAL | Status: DC

## 2023-05-27 MED ORDER — IRON (FERROUS SULFATE) 325 (65 FE) MG PO TABS: 1.0000 | ORAL_TABLET | Freq: Every day | ORAL | Status: AC

## 2023-05-27 MED ORDER — ACETAMINOPHEN 325 MG PO TABS: 650.0000 mg | ORAL_TABLET | Freq: Three times a day (TID) | ORAL | Status: DC

## 2023-05-27 MED ORDER — SERTRALINE HCL 50 MG PO TABS: 50.0000 mg | ORAL_TABLET | Freq: Every day | ORAL | Status: DC

## 2023-05-27 NOTE — Plan of Care (Signed)
  Problem: Education: Goal: Knowledge of General Education information will improve Description: Including pain rating scale, medication(s)/side effects and non-pharmacologic comfort measures Outcome: Progressing   Problem: Health Behavior/Discharge Planning: Goal: Ability to manage health-related needs will improve Outcome: Progressing   Problem: Clinical Measurements: Goal: Ability to maintain clinical measurements within normal limits will improve Outcome: Progressing Goal: Will remain free from infection Outcome: Progressing Goal: Diagnostic test results will improve Outcome: Progressing   Problem: Elimination: Goal: Will not experience complications related to urinary retention Outcome: Progressing   Problem: Pain Management: Goal: General experience of comfort will improve Outcome: Progressing   Problem: Safety: Goal: Ability to remain free from injury will improve Outcome: Progressing   Problem: Skin Integrity: Goal: Risk for impaired skin integrity will decrease Outcome: Progressing

## 2023-05-27 NOTE — Discharge Instructions (Addendum)
Orthopedic Instructions from Dr. Dallas Schimke  Weightbearing: NWB RLE; knee immobilizer on at all times. Incisional and dressing care: Reinforce dressings as needed Orthopedic device(s): None; knee immobilizer postop VTE prophylaxis: aspirin 81 mg BID x 14 days Pain control: PRN medications, judicious use of narcotics Follow - up plan: 2 weeks postop (around 06/07/23)     IMPORTANT INFORMATION: PAY CLOSE ATTENTION   PHYSICIAN DISCHARGE INSTRUCTIONS  Follow with Primary care provider  [name], MD  and other consultants as instructed by your Hospitalist Physician  SEEK MEDICAL CARE OR RETURN TO EMERGENCY ROOM IF SYMPTOMS COME BACK, WORSEN OR NEW PROBLEM DEVELOPS   Please note: You were cared for by a hospitalist during your hospital stay. Every effort will be made to forward records to your primary care provider.  You can request that your primary care provider send for your hospital records if they have not received them.  Once you are discharged, your primary care physician will handle any further medical issues. Please note that NO REFILLS for any discharge medications will be authorized once you are discharged, as it is imperative that you return to your primary care physician (or establish a relationship with a primary care physician if you do not have one) for your post hospital discharge needs so that they can reassess your need for medications and monitor your lab values.  Please get a complete blood count and chemistry panel checked by your Primary MD at your next visit, and again as instructed by your Primary MD.  Get Medicines reviewed and adjusted: Please take all your medications with you for your next visit with your Primary MD  Laboratory/radiological data: Please request your Primary MD to go over all hospital tests and procedure/radiological results at the follow up, please ask your primary care provider to get all Hospital records sent to his/her office.  In some cases,  they will be blood work, cultures and biopsy results pending at the time of your discharge. Please request that your primary care provider follow up on these results.  If you are diabetic, please bring your blood sugar readings with you to your follow up appointment with primary care.    Please call and make your follow up appointments as soon as possible.    Also Note the following: If you experience worsening of your admission symptoms, develop shortness of breath, life threatening emergency, suicidal or homicidal thoughts you must seek medical attention immediately by calling 911 or calling your MD immediately  if symptoms less severe.  You must read complete instructions/literature along with all the possible adverse reactions/side effects for all the Medicines you take and that have been prescribed to you. Take any new Medicines after you have completely understood and accpet all the possible adverse reactions/side effects.   Do not drive when taking Pain medications or sleeping medications (Benzodiazepines)  Do not take more than prescribed Pain, Sleep and Anxiety Medications. It is not advisable to combine anxiety,sleep and pain medications without talking with your primary care practitioner  Special Instructions: If you have smoked or chewed Tobacco  in the last 2 yrs please stop smoking, stop any regular Alcohol  and or any Recreational drug use.  Wear Seat belts while driving.  Do not drive if taking any narcotic, mind altering or controlled substances or recreational drugs or alcohol.

## 2023-05-27 NOTE — Consult Note (Signed)
[facility] Liaison Consult Note    05/27/2023  Tamara [name] [birth year] [id number]  Insurance:  [employer]   Primary Care Provider: [name], MD this provider is listed for the transition of care follow up appointments  and [facility] calls   [facility] Liaison screened the patient remotely at [facility].    The patient was screened for  hospitalization with noted high risk score for unplanned readmission risk 3 hospital admissions in 6 months.  The patient was assessed for potential Community Care Coordination service needs for post hospital transition for care coordination. Review of patient's electronic medical record reveals patient is being recommended for a SNF rehab level of care for transition.   Plan: No needs for immediate community CC follow up  Referral request for community care coordination: Needs are to be met at the SNF rehab level of care for post hospital transition.   [facility] does not replace or interfere with any arrangements made by the Inpatient Transition of Care team.   For questions contact:   Charlesetta Shanks, RN, BSN, CCM [place]  [facility], [facility] Liaison Direct Dial: [phone] or secure chat Email: [email or web address]

## 2023-05-27 NOTE — Discharge Summary (Signed)
Physician Discharge Summary  Tamara [name] AVW:[id number] DOB: [birth year] DOA: 05/23/2023  PCP: [name], MD Orthopedics: Dr. Dallas Schimke   Admit date: 05/23/2023 Discharge date: 05/27/2023  Disposition:  SNF   Recommendations for Outpatient Follow-up:  Follow up with Dr. Dallas Schimke in 11 days around 06/07/23 for postop check Follow up with PCP in 2 weeks  Please obtain CBC in one week to follow hemoglobin Fall precautions  Orthopedic Recommendations from Dr. Dallas Schimke Weightbearing: NWB RLE; knee immobilizer on at all times. Incisional and dressing care: Reinforce dressings as needed Orthopedic device(s): None; knee immobilizer postop VTE prophylaxis: aspirin 81 mg BID x 14 days Pain control: PRN medications, judicious use of narcotics Follow - up plan: 2 weeks postop   Discharge Condition: STABLE   CODE STATUS: DNR DIET: regular   Brief Hospitalization Summary: Please see all hospital notes, images, labs for full details of the hospitalization. Admission Provider HPI: 89 year old female with a history of dementia, diabetes mellitus, hypertension, right breast cancer, and B12 deficiency, Fe deficiency, aortic valve stenosis presented as a transfer from [facility] secondary to right knee pain and swelling.  Per report, patient sustained a hyperflexion injury to the right knee when her leg came out of the wheelchair while daughter was pushing her up the ramp.  She noted swelling of the right knee as well as the right upper tibia-fibula area.  The patient was taken to [facility] emergency department.  X-ray showed an acute right proximal tibial metaphyseal fracture.  Orthopedics, Dr. Yevette Edwards was consulted.  He recommended transfer the patient to [facility].  Notably, the patient had a recent hospital admission from 03/23/2023 to 03/26/2023 secondary to acute respiratory failure from pneumonia.  The patient was treated with ceftriaxone and azithromycin.  She was discharged home with cephalexin and  azithromycin and prednisone.  She was discharged to [facility] health care SNF.  She was weaned off oxygen at the time of discharge.  She had a prior hospital mission from 11/21/2022 to 11/25/2022 when she was treated for right distal femur fracture.  A supracondylar nail was placed by Dr. Dallas Schimke on 11/22/2022.  She was subsequently discharged to skilled nursing facility.  Hospital Course by problem list   Closed fracture of right proximal tibia -Orthopedics, Dr. Dallas Schimke appreciated -ORIF 11/4--Dr. Dallas Schimke -Judicious opioids--has not required any opioids in last 2 days -DC to SNF -NWB on RLE; knee immobilizer on at all times per orthopedics -discussed with Dr. Dallas Schimke, rec aspirin 81 mg BID x 14 d for DVT PPX   Dementia without behavioral disturbance -Continue Aricept -pt had hospital delirium during last 2 hospitalizations -Able to answer simple questions, and for the most part recognizes family    Essential hypertension -amlodipine and losartan discontinued since her 11/2022 admission -BP soft>>improved, remains well controlled off anti-HTN meds   Diabetes mellitus type 2, controlled -diet controlled  -10/08/22 hemoglobin A1c--5.9   Right breast cancer -Currently in remission -Status post lumpectomy and lymph node dissection August 2014 -Patient declined radiation therapy -s/p Aromasin October 2014, continued till October 2019.  -Follow-up Dr. Ellin Saba   Anemia chronic disease -baseline Hgb ~10 - postop Hg stable at 7.7 - iron sulfate 325 mg daily x 1 month - recommend to check CBC in 1 week to follow Hg      Family Communication:   daughter udpated 11/5   Consultants:  ortho   Code Status:  DNR--confirmed with daughter   Discharge Diagnoses:  Principal Problem:   Closed fracture of right proximal  tibia Active Problems:   Essential (primary) hypertension   Dementia without behavioral disturbance (HCC)   Laceration of left upper extremity   History of type 2  diabetes mellitus   Discharge Instructions:  Allergies as of 05/27/2023       Reactions   Tramadol Other (See Comments)   Made her feel "loopy", excessive scratching        Medication List     STOP taking these medications    mupirocin ointment 2 % Commonly known as: BACTROBAN       TAKE these medications    acetaminophen 325 MG tablet Commonly known as: TYLENOL Take 2 tablets (650 mg total) by mouth 3 (three) times daily. What changed:  when to take this reasons to take this   alendronate 70 MG tablet Commonly known as: FOSAMAX Take 70 mg by mouth once a week.   aspirin EC 81 MG tablet Take 1 tablet (81 mg total) by mouth 2 (two) times daily for 14 days. Swallow whole.   calcium-vitamin D 500-200 MG-UNIT tablet Commonly known as: OSCAL WITH D Take 2 tablets by mouth daily with breakfast.   chlorhexidine 4 % external liquid Commonly known as: Hibiclens Apply topically daily as needed.   donepezil 10 MG tablet Commonly known as: ARICEPT Take 1 tablet (10 mg total) by mouth at bedtime.   doxylamine (Sleep) 25 MG tablet Commonly known as: UNISOM Take 25 mg by mouth at bedtime as needed for sleep.   Iron (Ferrous Sulfate) 325 (65 Fe) MG Tabs Take 1 tablet by mouth daily with breakfast.   pantoprazole 40 MG tablet Commonly known as: Protonix Take 1 tablet (40 mg total) by mouth daily.   polyethylene glycol 17 g packet Commonly known as: MIRALAX / GLYCOLAX Take 17 g by mouth daily.   risperiDONE 0.5 MG tablet Commonly known as: RISPERDAL Take 1 tablet (0.5 mg total) by mouth at bedtime. What changed: See the new instructions.   sertraline 50 MG tablet Commonly known as: ZOLOFT Take 1 tablet (50 mg total) by mouth at bedtime. What changed: when to take this        Follow-up Information     [name], MD. Schedule an appointment as soon as possible for a visit in 2 week(s).   Specialty: Internal Medicine Why: Hospital Follow Up Contact  information: [address] [place] [address] [phone]         Oliver Barre, MD. Schedule an appointment as soon as possible for a visit in 11 day(s).   Specialties: Orthopedic Surgery, Sports Medicine Why: Hospital Follow Up Contact information: [address] [place] [address] [phone]                Allergies  Allergen Reactions   Tramadol Other (See Comments)    Made her feel "loopy", excessive scratching   Allergies as of 05/27/2023       Reactions   Tramadol Other (See Comments)   Made her feel "loopy", excessive scratching        Medication List     STOP taking these medications    mupirocin ointment 2 % Commonly known as: BACTROBAN       TAKE these medications    acetaminophen 325 MG tablet Commonly known as: TYLENOL Take 2 tablets (650 mg total) by mouth 3 (three) times daily. What changed:  when to take this reasons to take this   alendronate 70 MG tablet Commonly known as: FOSAMAX Take 70  mg by mouth once a week.   aspirin EC 81 MG tablet Take 1 tablet (81 mg total) by mouth 2 (two) times daily for 14 days. Swallow whole.   calcium-vitamin D 500-200 MG-UNIT tablet Commonly known as: OSCAL WITH D Take 2 tablets by mouth daily with breakfast.   chlorhexidine 4 % external liquid Commonly known as: Hibiclens Apply topically daily as needed.   donepezil 10 MG tablet Commonly known as: ARICEPT Take 1 tablet (10 mg total) by mouth at bedtime.   doxylamine (Sleep) 25 MG tablet Commonly known as: UNISOM Take 25 mg by mouth at bedtime as needed for sleep.   Iron (Ferrous Sulfate) 325 (65 Fe) MG Tabs Take 1 tablet by mouth daily with breakfast.   pantoprazole 40 MG tablet Commonly known as: Protonix Take 1 tablet (40 mg total) by mouth daily.   polyethylene glycol 17 g packet Commonly known as: MIRALAX / GLYCOLAX Take 17 g by mouth daily.   risperiDONE 0.5 MG tablet Commonly known as: RISPERDAL Take  1 tablet (0.5 mg total) by mouth at bedtime. What changed: See the new instructions.   sertraline 50 MG tablet Commonly known as: ZOLOFT Take 1 tablet (50 mg total) by mouth at bedtime. What changed: when to take this        Procedures/Studies: DG Tibia/Fibula Right  Result Date: 05/24/2023 CLINICAL DATA:  Plate and screw fixation proximal tibial fracture EXAM: RIGHT TIBIA AND FIBULA - 2 VIEW COMPARISON:  Tibia/fibula radiographs 2 days prior FINDINGS: Six C-arm fluoroscopic images were obtained intraoperatively and submitted for post operative interpretation. The initial image demonstrates similar alignment of the proximal tibial fracture. Subsequent images demonstrate sideplate and screw fixation of the fracture with improved alignment. There is no evidence of immediate complication. Fluoro time 35 seconds, dose 2.6 mGy. Please see the performing provider's procedural report for further detail. IMPRESSION: Intraoperative images during sideplate and screw fixation of a proximal tibial fracture as above. Electronically Signed   By: Lesia Hausen M.D.   On: 05/24/2023 15:07   DG Tibia/Fibula Right Port  Result Date: 05/24/2023 CLINICAL DATA:  Closed fracture of tibia EXAM: PORTABLE RIGHT TIBIA AND FIBULA - 2 VIEW COMPARISON:  Same-day intraoperative images, tibia/fibula radiographs 2 days prior FINDINGS: There has been interval sideplate and screw fixation of the proximal tibia with improved alignment of the comminuted fracture. Hardware alignment is within expected limits, without evidence of complication. Postsurgical changes are also noted in the distal femur. Severe joint space narrowing of the lateral compartment in the knee is noted. IMPRESSION: Improved alignment of the comminuted proximal tibial fracture following sideplate and screw fixation. Electronically Signed   By: Lesia Hausen M.D.   On: 05/24/2023 15:05   DG C-Arm 1-60 Min-No Report  Result Date: 05/24/2023 Fluoroscopy was  utilized by the requesting physician.  No radiographic interpretation.     Subjective: Pt pleasant with dementia, no specific complaints.   Discharge Exam: Vitals:   05/26/23 2011 05/27/23 0442  BP: (!) 85/38 (!) 96/50  Pulse: 86 93  Resp: 16 16  Temp: 98 F (36.7 C) 98.4 F (36.9 C)  SpO2: 98% 96%   Vitals:   05/25/23 2248 05/26/23 1259 05/26/23 2011 05/27/23 0442  BP: (!) 92/46 (!) 105/53 (!) 85/38 (!) 96/50  Pulse: 90 74 86 93  Resp: 18 20 16 16   Temp: 98.2 F (36.8 C) 99 F (37.2 C) 98 F (36.7 C) 98.4 F (36.9 C)  TempSrc: Oral Oral  Oral  SpO2:  95% 96% 98% 96%  Weight:       General:  awake, alert, sitting up in chair, no distress;  HEENT: neck supple, NCAT, EOMI.  Cardiovascular: normal s1, s2 sounds, no m/r/g Respiratory: rare expiratory wheeze heard. Abdomen: soft, ND/NT, no HSM.  Extremities: warm extremities   The results of significant diagnostics from this hospitalization (including imaging, microbiology, ancillary and laboratory) are listed below for reference.     Microbiology: No results found for this or any previous visit (from the past 240 hour(s)).   Labs: BNP (last 3 results) Recent Labs    10/11/22 1430  BNP 237.0*   Basic Metabolic Panel: Recent Labs  Lab 05/23/23 0635 05/24/23 0828 05/25/23 0433 05/26/23 0436  NA 141 144 143 142  K 3.8 3.8 3.7 3.5  CL 112* 112* 113* 112*  CO2 21* 20* 22 23  GLUCOSE 142* 105* 130* 124*  BUN 35* 31* 32* 27*  CREATININE 1.03* 0.99 1.09* 0.98  CALCIUM 7.9* 7.9* 7.4* 7.4*  MG 2.1  --  2.2 2.2  PHOS 3.7  --   --   --    Liver Function Tests: Recent Labs  Lab 05/23/23 0635  AST 16  ALT 17  ALKPHOS 66  BILITOT 1.0  PROT 6.3*  ALBUMIN 3.1*   No results for input(s): "LIPASE", "AMYLASE" in the last 168 hours. No results for input(s): "AMMONIA" in the last 168 hours. CBC: Recent Labs  Lab 05/23/23 0635 05/24/23 0828 05/25/23 0433 05/26/23 0436  WBC 7.8 7.8 8.0 8.2  HGB 9.3* 8.5*  7.7* 7.7*  HCT 28.4* 26.1* 24.6* 24.5*  MCV 95.0 97.0 98.4 97.6  PLT 178 200 206 217   Cardiac Enzymes: No results for input(s): "CKTOTAL", "CKMB", "CKMBINDEX", "TROPONINI" in the last 168 hours. BNP: Invalid input(s): "POCBNP" CBG: Recent Labs  Lab 05/24/23 1302  GLUCAP 100*   D-Dimer No results for input(s): "DDIMER" in the last 72 hours. Hgb A1c No results for input(s): "HGBA1C" in the last 72 hours. Lipid Profile No results for input(s): "CHOL", "HDL", "LDLCALC", "TRIG", "CHOLHDL", "LDLDIRECT" in the last 72 hours. Thyroid function studies No results for input(s): "TSH", "T4TOTAL", "T3FREE", "THYROIDAB" in the last 72 hours.  Invalid input(s): "FREET3" Anemia work up No results for input(s): "VITAMINB12", "FOLATE", "FERRITIN", "TIBC", "IRON", "RETICCTPCT" in the last 72 hours. Urinalysis    Component Value Date/Time   COLORURINE YELLOW 11/22/2022 1200   APPEARANCEUR TURBID (A) 11/22/2022 1200   LABSPEC 1.015 11/22/2022 1200   PHURINE 5.0 11/22/2022 1200   GLUCOSEU NEGATIVE 11/22/2022 1200   HGBUR SMALL (A) 11/22/2022 1200   BILIRUBINUR NEGATIVE 11/22/2022 1200   KETONESUR NEGATIVE 11/22/2022 1200   PROTEINUR 100 (A) 11/22/2022 1200   NITRITE NEGATIVE 11/22/2022 1200   LEUKOCYTESUR SMALL (A) 11/22/2022 1200   Sepsis Labs Recent Labs  Lab 05/23/23 0635 05/24/23 0828 05/25/23 0433 05/26/23 0436  WBC 7.8 7.8 8.0 8.2   Microbiology No results found for this or any previous visit (from the past 240 hour(s)).  Time coordinating discharge:  43 mins   SIGNED:  Standley Dakins, MD  [facility] 05/27/2023, 8:52 AM How to contact the [facility] Attending or Consulting provider 7A - 7P or covering provider during after hours 7P -7A, for this patient?  Check the care team in [facility] and look for a) attending/consulting [facility] provider listed and b) the [facility] team listed Log into [email or web address] and use [place]'s universal password to access. If you do not have the  password, please contact  the hospital operator. Locate the [facility] provider you are looking for under [facility] and page to a number that you can be directly reached. If you still have difficulty reaching the provider, please page the [facility] (Director on Call) for the Hospitalists listed on amion for assistance.

## 2023-05-27 NOTE — Care Management Important Message (Signed)
Important Message  Patient Details  Name: Tamara [name] MRN: [id number] Date of Birth: [birth year]   Important Message Given:  Yes - Medicare IM     Corey Harold 05/27/2023, 9:03 AM

## 2023-05-27 NOTE — TOC Transition Note (Signed)
Transition of Care [facility]) - CM/SW Discharge Note   Patient Details  Name: Tamara [name] MRN: [id number] Date of Birth: 02-Sep-1933  Transition of Care [facility]) CM/SW Contact:  Villa Herb, LCSWA Phone Number: 05/27/2023, 9:47 AM   Clinical Narrative:    Pts insurance Berkley Harvey is approved. CSW spoke with Destiny at [facility] who states they can accept pt today. CSW provided RN with room and report numbers. Med necessity completed and sent to the floor for RN. EMS transport called, pt is on the list. Secure VM left for pts daughter with update. TOC signing off.   Final next level of care: Skilled Nursing Facility Barriers to Discharge: Barriers Resolved   Patient Goals and CMS Choice CMS Medicare.gov Compare Post Acute Care list provided to:: Patient Represenative (must comment) Choice offered to / list presented to : Adult Children  Discharge Placement                  Patient to be transferred to facility by: EMS Name of family member notified: daughter Patient and family notified of of transfer: 05/27/23  Discharge Plan and Services Additional resources added to the After Visit Summary for   In-house Referral: Clinical Social Work Discharge Planning Services: CM Consult Post Acute Care Choice: Skilled Nursing Facility                               Social Determinants of Health (SDOH) Interventions SDOH Screenings   Food Insecurity: No Food Insecurity (03/23/2023)  Housing: Low Risk  (03/23/2023)  Transportation Needs: No Transportation Needs (04/02/2023)   Received from [facility]  Utilities: Not At Risk (03/23/2023)  Depression (PHQ2-9): Low Risk  (10/14/2022)  Tobacco Use: Medium Risk (05/24/2023)  Health Literacy: High Risk (04/02/2023)   Received from [facility]     Readmission Risk Interventions    05/24/2023    2:06 PM 11/23/2022   10:51 AM  Readmission Risk Prevention Plan  Transportation Screening Complete Complete  PCP or Specialist Appt within 5-7  Days  Not Complete  Home Care Screening  Complete  Medication Review (RN CM)  Complete  HRI or Home Care Consult Complete   Social Work Consult for Recovery Care Planning/Counseling Complete   Palliative Care Screening Not Applicable   Medication Review Oceanographer) Complete

## 2023-05-27 NOTE — Progress Notes (Signed)
Report given to [facility] at [facility]

## 2023-05-28 NOTE — Anesthesia Postprocedure Evaluation (Signed)
Anesthesia Post Note  Patient: Tamara [name]  Procedure(s) Performed: OPEN REDUCTION INTERNAL FIXATION (ORIF) TIBIAL PLATEAU (Right: Leg Lower)  Patient location during evaluation: Phase II Anesthesia Type: General Level of consciousness: awake Pain management: pain level controlled Vital Signs Assessment: post-procedure vital signs reviewed and stable Respiratory status: spontaneous breathing and respiratory function stable Cardiovascular status: blood pressure returned to baseline and stable Postop Assessment: no headache and no apparent nausea or vomiting Anesthetic complications: no Comments: Late entry   No notable events documented.   Last Vitals:  Vitals:   05/27/23 0442 05/27/23 0909  BP: (!) 96/50 (!) 91/50  Pulse: 93 89  Resp: 16 18  Temp: 36.9 C 37.1 C  SpO2: 96% 96%    Last Pain:  Vitals:   05/27/23 0846  TempSrc:   PainSc: 10-Worst pain ever                 Windell Norfolk

## 2023-06-03 DIAGNOSIS — D638 Anemia in other chronic diseases classified elsewhere: Secondary | ICD-10-CM | POA: Diagnosis not present

## 2023-06-03 DIAGNOSIS — D649 Anemia, unspecified: Secondary | ICD-10-CM | POA: Diagnosis not present

## 2023-06-04 DIAGNOSIS — J8410 Pulmonary fibrosis, unspecified: Secondary | ICD-10-CM | POA: Diagnosis not present

## 2023-06-04 DIAGNOSIS — D649 Anemia, unspecified: Secondary | ICD-10-CM | POA: Diagnosis not present

## 2023-06-04 DIAGNOSIS — M6281 Muscle weakness (generalized): Secondary | ICD-10-CM | POA: Diagnosis not present

## 2023-06-04 DIAGNOSIS — S72001D Fracture of unspecified part of neck of right femur, subsequent encounter for closed fracture with routine healing: Secondary | ICD-10-CM | POA: Diagnosis not present

## 2023-06-04 DIAGNOSIS — R262 Difficulty in walking, not elsewhere classified: Secondary | ICD-10-CM | POA: Diagnosis not present

## 2023-06-11 ENCOUNTER — Ambulatory Visit: Payer: PPO | Admitting: Orthopedic Surgery

## 2023-06-18 DIAGNOSIS — D638 Anemia in other chronic diseases classified elsewhere: Secondary | ICD-10-CM | POA: Diagnosis not present

## 2023-06-29 ENCOUNTER — Encounter: Payer: PPO | Admitting: Orthopedic Surgery

## 2023-07-04 DIAGNOSIS — M6281 Muscle weakness (generalized): Secondary | ICD-10-CM | POA: Diagnosis not present

## 2023-07-04 DIAGNOSIS — S72001D Fracture of unspecified part of neck of right femur, subsequent encounter for closed fracture with routine healing: Secondary | ICD-10-CM | POA: Diagnosis not present

## 2023-07-04 DIAGNOSIS — J8410 Pulmonary fibrosis, unspecified: Secondary | ICD-10-CM | POA: Diagnosis not present

## 2023-07-04 DIAGNOSIS — R262 Difficulty in walking, not elsewhere classified: Secondary | ICD-10-CM | POA: Diagnosis not present

## 2023-07-07 ENCOUNTER — Other Ambulatory Visit (INDEPENDENT_AMBULATORY_CARE_PROVIDER_SITE_OTHER): Payer: PPO

## 2023-07-07 ENCOUNTER — Encounter: Payer: Self-pay | Admitting: Orthopedic Surgery

## 2023-07-07 ENCOUNTER — Ambulatory Visit (INDEPENDENT_AMBULATORY_CARE_PROVIDER_SITE_OTHER): Payer: PPO | Admitting: Orthopedic Surgery

## 2023-07-07 DIAGNOSIS — S82101D Unspecified fracture of upper end of right tibia, subsequent encounter for closed fracture with routine healing: Secondary | ICD-10-CM | POA: Diagnosis not present

## 2023-07-07 DIAGNOSIS — S72351D Displaced comminuted fracture of shaft of right femur, subsequent encounter for closed fracture with routine healing: Secondary | ICD-10-CM

## 2023-07-07 DIAGNOSIS — T8131XA Disruption of external operation (surgical) wound, not elsewhere classified, initial encounter: Secondary | ICD-10-CM

## 2023-07-07 NOTE — Progress Notes (Signed)
Orthopaedic Postop Note  Assessment: Tamara [name] is a 89 y.o. female s/p ORIF of right proximal tibia fracture with a lateral based plate  DOS: 40/03/8118  Plan: Sutures were previously trimmed There has been incision breakdown and dehiscence.  Currently, there is some scabbing, without drainage or surrounding erythema Initiate wet-to-dry dressing, to be changed twice daily Range of motion as tolerated.  Okay to DC the knee immobilizer Weightbearing as tolerated.  Use a walker. Follow-up in 2 weeks for wound check   Follow-up: Return in about 2 weeks (around 07/21/2023). XR at next visit: None  Subjective:  Chief Complaint  Patient presents with   Routine Post Op    R leg DOS 05/24/23    History of Present Illness: Tamara [name] is a 89 y.o. female who presents following the above stated procedure.  Her most recent procedure was approximately 6 weeks ago.  She remains in a nursing facility.  She has continued to use a knee immobilizer.  She does demonstrate some confusion.  No staff is with her in the room today.  No fevers or chills.  Regular dressing has been on her knee.  Review of Systems: No fevers or chills No shortness of breath.   Objective: There were no vitals taken for this visit.  Physical Exam:  Seated in a wheelchair.  Surgical incision over the anterior lateral aspect of the right knee demonstrates some dehiscence and wound breakdown.  There is scabbing at the base of the incision.  No drainage.  No surrounding erythema.  She tolerates flexion to approximately 90 degrees.  She can get to approximately 10 degrees short of full extension.  Toes are warm and well-perfused.  She reports that she has intact sensation to the dorsum of the foot.  Active motion intact in the EHL/TA.    IMAGING: I personally ordered and reviewed the following images:   AP and lateral views of the right knee were obtained in clinic today.  These are compared to prior x-rays.  Plate  screw fixation remains in stable position.  Screws are not backing out.  Fracture lines are barely visible.  There has been some interval consolidation.  No further displacement.  No additional injuries.  No bony lesions.  Impression: Right proximal tibia fracture in stable alignment without hardware failure.   Oliver Barre, MD 07/07/2023 9:54 AM

## 2023-07-07 NOTE — Patient Instructions (Signed)
Okay to discontinue use of the knee immobilizer.  Advance her to range of motion as tolerated, as well as weightbearing as tolerated.  She needs physical therapy daily, walking with a walker.  Wet-to-dry dressings on her surgical incision to be changed twice daily  Return to clinic in 2 weeks for repeat evaluation of her incision.

## 2023-07-10 DIAGNOSIS — E1169 Type 2 diabetes mellitus with other specified complication: Secondary | ICD-10-CM | POA: Diagnosis not present

## 2023-07-10 DIAGNOSIS — S82111A Displaced fracture of right tibial spine, initial encounter for closed fracture: Secondary | ICD-10-CM | POA: Diagnosis not present

## 2023-07-10 DIAGNOSIS — R531 Weakness: Secondary | ICD-10-CM | POA: Diagnosis not present

## 2023-07-10 DIAGNOSIS — F0150 Vascular dementia without behavioral disturbance: Secondary | ICD-10-CM | POA: Diagnosis not present

## 2023-07-10 DIAGNOSIS — I10 Essential (primary) hypertension: Secondary | ICD-10-CM | POA: Diagnosis not present

## 2023-07-19 DIAGNOSIS — D638 Anemia in other chronic diseases classified elsewhere: Secondary | ICD-10-CM | POA: Diagnosis not present

## 2023-07-19 DIAGNOSIS — R319 Hematuria, unspecified: Secondary | ICD-10-CM | POA: Diagnosis not present

## 2023-07-23 ENCOUNTER — Ambulatory Visit (INDEPENDENT_AMBULATORY_CARE_PROVIDER_SITE_OTHER): Payer: PPO | Admitting: Orthopedic Surgery

## 2023-07-23 ENCOUNTER — Encounter: Payer: Self-pay | Admitting: Orthopedic Surgery

## 2023-07-23 DIAGNOSIS — T8131XD Disruption of external operation (surgical) wound, not elsewhere classified, subsequent encounter: Secondary | ICD-10-CM

## 2023-07-23 NOTE — Progress Notes (Signed)
 Orthopaedic Postop Note  Assessment: Tamara [name] is a 89 y.o. female s/p ORIF of right proximal tibia fracture with a lateral based plate  DOS: 88/11/7973  Plan: Tamara [name] is slowly getting better.  Breakdown of surgical incision has gotten progressively smaller.  Small amount of eschar was removed in clinic today with a scalpel.  Continue with wet-to-dry dressings, to be changed twice daily.  Follow-up in 2 weeks.   Follow-up: Return in about 2 weeks (around 08/06/2023). XR at next visit: None  Subjective:  Chief Complaint  Patient presents with   Routine Post Op    R leg DOS 05/24/23    History of Present Illness: Tamara [name] is a 89 y.o. female who presents following the above stated procedure.  Her most recent procedure was approximately 2 months ago.  She remains in a nursing facility.  I saw her in clinic a couple weeks ago, and noted some breakdown around the surgical incision.  They have been changing the dressing, using wet-to-dry dressings at least once per day.  Otherwise, she is doing okay.  She has started to ambulate.   Review of Systems: No fevers or chills No shortness of breath.   Objective: There were no vitals taken for this visit.  Physical Exam:  Seated in a wheelchair.  Surgical incision over the anterior lateral aspect of the right knee demonstrates some dehiscence and wound breakdown.  Minimal redness surrounding the incision.  There is eschar at the base of the wound breakdown.  No tenderness to palpation.  Some fresh bleeding tissue at the periphery of the wound.  No drainage.  No surrounding erythema.  She tolerates flexion to approximately 90 degrees.  She can get to approximately 10 degrees short of full extension.  Toes are warm and well-perfused.  She reports that she has intact sensation to the dorsum of the foot.  Active motion intact in the EHL/TA.    IMAGING: I personally ordered and reviewed the following images:  No new imaging obtained  today.  Tamara DELENA Horde, MD 07/23/2023 1:06 PM

## 2023-07-23 NOTE — Patient Instructions (Signed)
 Continue with wet-to-dry dressings, change twice daily.  Follow-up in 2 weeks for a wound check  Weightbearing as tolerated.  Range of motion as tolerated.

## 2023-08-04 DIAGNOSIS — R262 Difficulty in walking, not elsewhere classified: Secondary | ICD-10-CM | POA: Diagnosis not present

## 2023-08-04 DIAGNOSIS — J8410 Pulmonary fibrosis, unspecified: Secondary | ICD-10-CM | POA: Diagnosis not present

## 2023-08-04 DIAGNOSIS — M6281 Muscle weakness (generalized): Secondary | ICD-10-CM | POA: Diagnosis not present

## 2023-08-04 DIAGNOSIS — S72001D Fracture of unspecified part of neck of right femur, subsequent encounter for closed fracture with routine healing: Secondary | ICD-10-CM | POA: Diagnosis not present

## 2023-08-06 ENCOUNTER — Ambulatory Visit (INDEPENDENT_AMBULATORY_CARE_PROVIDER_SITE_OTHER): Payer: PPO | Admitting: Orthopedic Surgery

## 2023-08-06 ENCOUNTER — Encounter: Payer: Self-pay | Admitting: Orthopedic Surgery

## 2023-08-06 DIAGNOSIS — T8131XD Disruption of external operation (surgical) wound, not elsewhere classified, subsequent encounter: Secondary | ICD-10-CM

## 2023-08-06 NOTE — Patient Instructions (Signed)
Recommend Manuka honey for the dressing.  Apply the gel once per day and cover with a dry dressing.   Manuka honey is available at the pharmacy, or online.  If there are issues, please contact the clinic.   Follow up in 2 weeks

## 2023-08-06 NOTE — Progress Notes (Signed)
Orthopaedic Postop Note  Assessment: Tamara [name] is a 89 y.o. female s/p ORIF of right proximal tibia fracture with a lateral based plate  DOS: 28/10/1322  Plan: Mrs. Hellard has had minimal progression in the healing of the wound dehiscence since I last saw her.  Continues to be an eschar at the base.  Wet-to-dry's do not appear to be helping further.  I have recommended daily use of manuka honey.  This is available at the pharmacy.  This can also be purchased online.  I would like to see her back in 2 weeks.  Follow-up: Return in about 2 weeks (around 08/20/2023). XR at next visit: None  Subjective:  Chief Complaint  Patient presents with   Wound Check    R leg     History of Present Illness: Tamara [name] is a 89 y.o. female who presents following the above stated procedure.  Her most recent procedure was approximately 2-3 months ago.  She remains in a nursing facility.  I have been following her for some breakdown of her surgical incision.  Staff of been treating it with wet-to-dry dressings changed twice daily for the past month.  Initially, these were helpful.  However, progress is stalled.  She is otherwise comfortable.  No's fevers or chills.   Review of Systems: No fevers or chills No shortness of breath.   Objective: There were no vitals taken for this visit.  Physical Exam:  Seated in a wheelchair.  Surgical incision over the anterior lateral aspect of the right knee demonstrates some dehiscence and wound breakdown.  No surrounding redness.  No erythema.  No drainage.  At the base of the wound is an eschar.  Currently, this is wet.  At the edges of the wound there is healthy bleeding granulation tissue.  No tenderness to palpation.   IMAGING: I personally ordered and reviewed the following images:  No new imaging obtained today.  Oliver Barre, MD 08/06/2023 9:10 AM

## 2023-08-08 DIAGNOSIS — S82101D Unspecified fracture of upper end of right tibia, subsequent encounter for closed fracture with routine healing: Secondary | ICD-10-CM | POA: Diagnosis not present

## 2023-08-08 DIAGNOSIS — E1169 Type 2 diabetes mellitus with other specified complication: Secondary | ICD-10-CM | POA: Diagnosis not present

## 2023-08-08 DIAGNOSIS — I10 Essential (primary) hypertension: Secondary | ICD-10-CM | POA: Diagnosis not present

## 2023-08-08 DIAGNOSIS — Z9181 History of falling: Secondary | ICD-10-CM | POA: Diagnosis not present

## 2023-08-08 DIAGNOSIS — E119 Type 2 diabetes mellitus without complications: Secondary | ICD-10-CM | POA: Diagnosis not present

## 2023-08-08 DIAGNOSIS — F0150 Vascular dementia without behavioral disturbance: Secondary | ICD-10-CM | POA: Diagnosis not present

## 2023-08-08 DIAGNOSIS — S82111D Displaced fracture of right tibial spine, subsequent encounter for closed fracture with routine healing: Secondary | ICD-10-CM | POA: Diagnosis not present

## 2023-08-08 DIAGNOSIS — R531 Weakness: Secondary | ICD-10-CM | POA: Diagnosis not present

## 2023-08-08 DIAGNOSIS — F0390 Unspecified dementia without behavioral disturbance: Secondary | ICD-10-CM | POA: Diagnosis not present

## 2023-08-09 DIAGNOSIS — F0150 Vascular dementia without behavioral disturbance: Secondary | ICD-10-CM | POA: Diagnosis not present

## 2023-08-09 DIAGNOSIS — E1169 Type 2 diabetes mellitus with other specified complication: Secondary | ICD-10-CM | POA: Diagnosis not present

## 2023-08-09 DIAGNOSIS — I10 Essential (primary) hypertension: Secondary | ICD-10-CM | POA: Diagnosis not present

## 2023-08-09 DIAGNOSIS — R531 Weakness: Secondary | ICD-10-CM | POA: Diagnosis not present

## 2023-08-09 DIAGNOSIS — S82111D Displaced fracture of right tibial spine, subsequent encounter for closed fracture with routine healing: Secondary | ICD-10-CM | POA: Diagnosis not present

## 2023-08-14 DIAGNOSIS — L89616 Pressure-induced deep tissue damage of right heel: Secondary | ICD-10-CM | POA: Diagnosis not present

## 2023-08-14 DIAGNOSIS — D649 Anemia, unspecified: Secondary | ICD-10-CM | POA: Diagnosis not present

## 2023-08-14 DIAGNOSIS — F03B3 Unspecified dementia, moderate, with mood disturbance: Secondary | ICD-10-CM | POA: Diagnosis not present

## 2023-08-14 DIAGNOSIS — E119 Type 2 diabetes mellitus without complications: Secondary | ICD-10-CM | POA: Diagnosis not present

## 2023-08-14 DIAGNOSIS — Z9181 History of falling: Secondary | ICD-10-CM | POA: Diagnosis not present

## 2023-08-14 DIAGNOSIS — J8410 Pulmonary fibrosis, unspecified: Secondary | ICD-10-CM | POA: Diagnosis not present

## 2023-08-14 DIAGNOSIS — I10 Essential (primary) hypertension: Secondary | ICD-10-CM | POA: Diagnosis not present

## 2023-08-14 DIAGNOSIS — S82101D Unspecified fracture of upper end of right tibia, subsequent encounter for closed fracture with routine healing: Secondary | ICD-10-CM | POA: Diagnosis not present

## 2023-08-14 DIAGNOSIS — Z7983 Long term (current) use of bisphosphonates: Secondary | ICD-10-CM | POA: Diagnosis not present

## 2023-08-14 DIAGNOSIS — Z853 Personal history of malignant neoplasm of breast: Secondary | ICD-10-CM | POA: Diagnosis not present

## 2023-08-14 DIAGNOSIS — Z8701 Personal history of pneumonia (recurrent): Secondary | ICD-10-CM | POA: Diagnosis not present

## 2023-08-14 DIAGNOSIS — F03B11 Unspecified dementia, moderate, with agitation: Secondary | ICD-10-CM | POA: Diagnosis not present

## 2023-08-14 DIAGNOSIS — L89322 Pressure ulcer of left buttock, stage 2: Secondary | ICD-10-CM | POA: Diagnosis not present

## 2023-08-14 DIAGNOSIS — Z96641 Presence of right artificial hip joint: Secondary | ICD-10-CM | POA: Diagnosis not present

## 2023-08-14 DIAGNOSIS — Z9011 Acquired absence of right breast and nipple: Secondary | ICD-10-CM | POA: Diagnosis not present

## 2023-08-14 DIAGNOSIS — F319 Bipolar disorder, unspecified: Secondary | ICD-10-CM | POA: Diagnosis not present

## 2023-08-16 ENCOUNTER — Other Ambulatory Visit (HOSPITAL_COMMUNITY): Payer: Self-pay | Admitting: Internal Medicine

## 2023-08-16 ENCOUNTER — Telehealth: Payer: Self-pay | Admitting: Orthopedic Surgery

## 2023-08-16 ENCOUNTER — Ambulatory Visit (HOSPITAL_COMMUNITY)
Admission: RE | Admit: 2023-08-16 | Discharge: 2023-08-16 | Disposition: A | Discharge status: Home / Self Care | Payer: PPO | Source: Ambulatory Visit | Attending: Internal Medicine | Admitting: Internal Medicine

## 2023-08-16 DIAGNOSIS — J8410 Pulmonary fibrosis, unspecified: Secondary | ICD-10-CM | POA: Diagnosis not present

## 2023-08-16 DIAGNOSIS — S82201A Unspecified fracture of shaft of right tibia, initial encounter for closed fracture: Secondary | ICD-10-CM | POA: Diagnosis not present

## 2023-08-16 DIAGNOSIS — R059 Cough, unspecified: Secondary | ICD-10-CM | POA: Diagnosis not present

## 2023-08-16 DIAGNOSIS — S2241XA Multiple fractures of ribs, right side, initial encounter for closed fracture: Secondary | ICD-10-CM | POA: Diagnosis not present

## 2023-08-16 DIAGNOSIS — R0989 Other specified symptoms and signs involving the circulatory and respiratory systems: Secondary | ICD-10-CM | POA: Diagnosis not present

## 2023-08-16 DIAGNOSIS — D649 Anemia, unspecified: Secondary | ICD-10-CM | POA: Diagnosis not present

## 2023-08-16 DIAGNOSIS — R051 Acute cough: Secondary | ICD-10-CM | POA: Diagnosis not present

## 2023-08-16 NOTE — Telephone Encounter (Signed)
Dr. Dallas Schimke pt Tamara [name] a nurse w/[facility] [phone] lvm stating she saw this patient on 08/14/23.  She is requesting [name] nursing 1w9.  Her daughter is changing that dressing daily, but they need to get in there and oversee the wound care using Medihoney.

## 2023-08-17 DIAGNOSIS — R051 Acute cough: Secondary | ICD-10-CM | POA: Diagnosis not present

## 2023-08-17 DIAGNOSIS — J84112 Idiopathic pulmonary fibrosis: Secondary | ICD-10-CM | POA: Diagnosis not present

## 2023-08-18 DIAGNOSIS — S82201D Unspecified fracture of shaft of right tibia, subsequent encounter for closed fracture with routine healing: Secondary | ICD-10-CM | POA: Diagnosis not present

## 2023-08-24 ENCOUNTER — Encounter: Payer: Self-pay | Admitting: Orthopedic Surgery

## 2023-08-24 ENCOUNTER — Ambulatory Visit (INDEPENDENT_AMBULATORY_CARE_PROVIDER_SITE_OTHER): Payer: PPO | Admitting: Orthopedic Surgery

## 2023-08-24 ENCOUNTER — Telehealth: Payer: Self-pay

## 2023-08-24 DIAGNOSIS — T8131XD Disruption of external operation (surgical) wound, not elsewhere classified, subsequent encounter: Secondary | ICD-10-CM

## 2023-08-24 NOTE — Telephone Encounter (Signed)
Verbal order left on VM with call back information in case of questions.

## 2023-08-24 NOTE — Telephone Encounter (Signed)
-----   Message from Jerona LULLA Sage sent at 08/24/2023 10:19 AM EST ----- We will call her from my office and plan to see her today.  Thank you. ----- Message ----- From: Onesimo Oneil LABOR, MD Sent: 08/24/2023   9:46 AM EST To: Jerona Sage LULLA, MD  Good morning  Mrs. Burget had surgery for a proximal tibia fracture in early November.  She did well following the procedure but developed some dehiscence.  We have treated this with wet-to-dry dressings and more recently with manuka honey.  It is has not gotten worse, but there has been limited improvement.  I would like for you to evaluate to see if there is something different that we should try next.  There are pictures in her chart.  I am happy to help coordinate further treatment or wound care through our local PT.  Let me know how I can assist.   Appreciate your help  Oneil

## 2023-08-24 NOTE — Telephone Encounter (Signed)
I called and sw pt's daughter and offered appt today. Due to transportation issues worked out appt for Thursday at 11:00. Will call if need to adjust due to transportation.

## 2023-08-24 NOTE — Progress Notes (Signed)
 Orthopaedic Postop Note  Assessment: Tamara [name] is a 89 y.o. female s/p ORIF of right proximal tibia fracture with a lateral based plate  DOS: 88/11/7973  Plan: Mrs. Diss dehiscence continues to slowly recover.  It is not getting worse.  She has been treating it most recently with manuka honey.  This has been changed for couple times a day.  At this point, I would like her to be evaluated by Dr. Harden, for additional recommendations.  If we plan to continue with wound care, we could consider PT wound care, closer to home.  Follow-up: Return if symptoms worsen or fail to improve. XR at next visit: None  Subjective:  Chief Complaint  Patient presents with   Wound Check    R lower leg    History of Present Illness: Tamara [name] is a 89 y.o. female who presents following the above stated procedure.  Her most recent procedure was approximately 3 months ago.  She developed some dehiscence postop.  Initially we treated this with wet-to-dry's.  Most recently, we have been treating this with manuka honey.  Little progression is noticeable.  No drainage or concern for an infection around the incision.  Otherwise, she has been dealing with some congestion.  According to her daughter, she has been reluctant to walk.  She has been refusing physical therapy.  She is now at home, and is being cared for by her family.   Review of Systems: No fevers or chills No shortness of breath.   Objective: There were no vitals taken for this visit.  Physical Exam:  Seated in a wheelchair.  No purulence is appreciated.  No tenderness to palpation.  Range of motion is short of full extension, by approximately 10 degrees.  She is sitting comfortably roughly 90 degrees.     IMAGING: I personally ordered and reviewed the following images:  No new imaging obtained today.  Tamara DELENA Horde, MD 08/24/2023 9:41 AM

## 2023-08-26 ENCOUNTER — Encounter: Payer: Self-pay | Admitting: Orthopedic Surgery

## 2023-08-26 ENCOUNTER — Ambulatory Visit: Payer: PPO | Admitting: Orthopedic Surgery

## 2023-08-26 DIAGNOSIS — T8131XD Disruption of external operation (surgical) wound, not elsewhere classified, subsequent encounter: Secondary | ICD-10-CM | POA: Diagnosis not present

## 2023-08-26 DIAGNOSIS — L97411 Non-pressure chronic ulcer of right heel and midfoot limited to breakdown of skin: Secondary | ICD-10-CM | POA: Diagnosis not present

## 2023-08-26 NOTE — Progress Notes (Signed)
 Office Visit Note   Patient: Tamara [name]           Date of Birth: 01/26/1934           MRN: [id number] Visit Date: 08/26/2023              Requested by: Sheryle Carwin, MD [address] [place],  [place] [address] PCP: Sheryle Carwin, MD  Chief Complaint  Patient presents with   Right Leg - Wound Check    05/24/2023 ORIF right tibial plateau fx with Dr. Onesimo      HPI: Patient is an 89 year old woman who is seen for initial evaluation for the right lower extremity.  Patient has developed a right heel ulcer and has dehiscence of a surgical wound tibial plateau status post open reduction internal fixation on the right.  Patient has had multiple falls.  Patient states that she is currently under medical management for osteoporosis.  Assessment & Plan: Visit Diagnoses:  1. Dehiscence of operative wound, subsequent encounter   2. 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  Will continue with manuka honey dressings to the right proximal tibia wound.  Will apply for benefits for Kerecis tissue graft for the right lateral tibial plateau wound.  Discussed the possible need for surgical intervention.  Follow-Up Instructions: Return in about 1 week (around 09/02/2023).   Ortho Exam  Patient is alert, oriented, no adenopathy, well-dressed, normal affect, normal respiratory effort. Examination patient has fibrinous exudate covering a wound that broke down from her tibial plateau open reduction internal fixation.  After informed consent the wound was debrided back to bleeding viable granulation tissue this was touched with silver nitrate.  After debridement the wound is 2 x 5 cm.  Patient has a palpable dorsalis pedis pulse.  Examination of the right heel she has a large blister on the right heel that measures 4 x 5 cm.  There is no cellulitis no drainage.  The proximal tibia  wound does not probe to hardware or bone.  Imaging: No results found. No images are attached to the encounter.  Labs: Lab Results  Component Value Date   HGBA1C 5.9 (H) 10/08/2022   REPTSTATUS 11/23/2022 FINAL 11/22/2022   CULT (A) 11/22/2022    >=100,000 COLONIES/mL LACTOBACILLUS SPECIES Standardized susceptibility testing for this organism is not available. >=100,000 COLONIES/mL YEAST      Lab Results  Component Value Date   ALBUMIN 3.1 (L) 05/23/2023   ALBUMIN 2.8 (L) 12/03/2022   ALBUMIN 2.6 (L) 10/11/2022    Lab Results  Component Value Date   MG 2.2 05/26/2023   MG 2.2 05/25/2023   MG 2.1 05/23/2023   Lab Results  Component Value Date   VD25OH 39.82 10/17/2020   VD25OH 33.42 04/11/2020   VD25OH 44.7 02/24/2019    No results found for: PREALBUMIN    Latest Ref Rng & Units 05/26/2023    4:36 AM 05/25/2023    4:33 AM 05/24/2023    8:28 AM  CBC EXTENDED  WBC 4.0 - 10.5 K/uL 8.2  8.0  7.8   RBC 3.87 - 5.11 MIL/uL 2.51  2.50  2.69   Hemoglobin 12.0 - 15.0 g/dL 7.7  7.7  8.5   HCT 63.9 - 46.0 % 24.5  24.6  26.1   Platelets 150 - 400 K/uL 217  206  200  There is no height or weight on file to calculate BMI.  Orders:  No orders of the defined types were placed in this encounter.  No orders of the defined types were placed in this encounter.    Procedures: No procedures performed  Clinical Data: No additional findings.  ROS:  All other systems negative, except as noted in the HPI. Review of Systems  Objective: Vital Signs: There were no vitals taken for this visit.  Specialty Comments:  No specialty comments available.  PMFS History: Patient Active Problem List   Diagnosis Date Noted   Closed fracture of right proximal tibia 05/23/2023   Laceration of left upper extremity 05/23/2023   History of type 2 diabetes mellitus 05/23/2023   Acute hypoxic respiratory failure (HCC) 03/23/2023   Closed comminuted intra-articular fracture of  distal femur, right, with delayed healing, subsequent encounter 11/21/2022   Closed displaced comminuted fracture of shaft of right femur (HCC) 11/21/2022   Iron  deficiency 10/27/2022   Aortic atherosclerosis (HCC) 10/19/2022   Hypertension associated with type 2 diabetes mellitus (HCC) 10/19/2022   Cystitis with hematuria 10/11/2022   Postoperative fever 10/10/2022   Closed fracture of right hip (HCC) 10/08/2022   Dementia without behavioral disturbance (HCC) 10/08/2022   Fall at home, initial encounter 10/08/2022   Aortic valve stenosis, nonrheumatic 03/24/2022   Pre-diabetes 03/24/2022   Disorder of mitral valve 03/24/2022   Essential (primary) hypertension 03/24/2022   Fibrosis lung (HCC) 03/24/2022   Acute blood loss anemia 12/15/2016   B12 deficiency 06/08/2016   Breast CA (HCC) 06/26/2014   Malignant neoplasm of upper-outer quadrant of RIGHT female breast (HCC) 04/25/2013   Osteoporosis 08/10/2007   Past Medical History:  Diagnosis Date   B12 deficiency 06/08/2016   Breast cancer (HCC)    Breast cancer, right breast (HCC) 04/25/2013   Cancer (HCC)    Claustrophobia    Claustrophobia    Dementia (HCC)    Hypertension    Iron  deficiency anemia 12/15/2016   Malignant neoplasm of upper-outer quadrant of RIGHT female breast (HCC) 04/25/2013   Stage IA (T1b, N0, M0) invasive mucinous breast carcinoma, S/P right breast lumpectomy by Dr. Mavis on 03/08/2013 with 1 negative sentinel node. Cancer was identified as ER+ 100%, PR+ 85%, Her2 negative, and Ki-67 marker at 17%. Oncotype Dx score of 20 with 13% 10 year risk of distant recurrence and absolute benefit of chemotherapy at 10 years in this risk category is negligible and less than 1%.   Osteoporosis     Family History  Problem Relation Age of Onset   Diabetes Mother    Cancer Father    Cancer Sister    Cancer Brother     Past Surgical History:  Procedure Laterality Date   ABDOMINAL HYSTERECTOMY     CATARACT EXTRACTION,  BILATERAL     FEMUR IM NAIL Right 11/22/2022   Procedure: INTRAMEDULLARY (IM) RETROGRADE FEMORAL NAILING;  Surgeon: Onesimo Oneil LABOR, MD;  Location: [facility];  Service: Orthopedics;  Laterality: Right;   HIP ARTHROPLASTY Right 10/09/2022   Procedure: ARTHROPLASTY BIPOLAR HIP (HEMIARTHROPLASTY);  Surgeon: Margrette Taft BRAVO, MD;  Location: [facility];  Service: Orthopedics;  Laterality: Right;   ORIF TIBIA PLATEAU Right 05/24/2023   Procedure: OPEN REDUCTION INTERNAL FIXATION (ORIF) TIBIAL PLATEAU;  Surgeon: Onesimo Oneil LABOR, MD;  Location: [facility];  Service: Orthopedics;  Laterality: Right;   PARTIAL MASTECTOMY WITH NEEDLE LOCALIZATION AND AXILLARY SENTINEL LYMPH NODE BX Right 03/08/2013   Procedure: PARTIAL MASTECTOMY WITH NEEDLE LOCALIZATION AND  AXILLARY SENTINEL LYMPH NODE BX;  Surgeon: Oneil DELENA Budge, MD;  Location: [facility];  Service: General;  Laterality: Right;  Sentinel Node Bx @ 7:30am Needle Loc @ 8:00am   Social History   Occupational History   Not on file  Tobacco Use   Smoking status: Former    Current packs/day: 0.00    Average packs/day: 0.5 packs/day for 40.0 years (20.0 ttl pk-yrs)    Types: Cigarettes    Start date: 03/03/1927    Quit date: 03/03/1967    Years since quitting: 56.5   Smokeless tobacco: Never  Vaping Use   Vaping status: Never Used  Substance and Sexual Activity   Alcohol use: No   Drug use: No   Sexual activity: Yes    Birth control/protection: Surgical

## 2023-08-27 DIAGNOSIS — S82201D Unspecified fracture of shaft of right tibia, subsequent encounter for closed fracture with routine healing: Secondary | ICD-10-CM | POA: Diagnosis not present

## 2023-09-02 ENCOUNTER — Ambulatory Visit: Payer: PPO | Admitting: Orthopedic Surgery

## 2023-09-02 ENCOUNTER — Encounter (HOSPITAL_COMMUNITY): Payer: Self-pay | Admitting: Hematology

## 2023-09-02 ENCOUNTER — Encounter: Payer: Self-pay | Admitting: Orthopedic Surgery

## 2023-09-02 DIAGNOSIS — T8131XD Disruption of external operation (surgical) wound, not elsewhere classified, subsequent encounter: Secondary | ICD-10-CM

## 2023-09-02 DIAGNOSIS — L97411 Non-pressure chronic ulcer of right heel and midfoot limited to breakdown of skin: Secondary | ICD-10-CM

## 2023-09-02 NOTE — Progress Notes (Signed)
Office Visit Note   Patient: Tamara [name]           Date of Birth: 1933-07-22           MRN: [id number] Visit Date: 09/02/2023              Requested by: [name], MD [address] [place],  [place] [address] PCP: [name], MD  Chief Complaint  Patient presents with   Right Leg - Wound Check      HPI: Patient is an 89 year old woman who is over 3 months out from open reduction internal fixation right tibial plateau fracture.  She has developed wound dehiscence and has been undergoing routine wound care with manuka honey.  Patient is also developed a decubitus right heel ulcer she is currently in a PRAFO for this.  Assessment & Plan: Visit Diagnoses:  1. Dehiscence of operative wound, subsequent encounter   2. 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  Pictures were obtained.  Plan for changing the 4 x 4 gauze on the outside of the Adaptic touch daily with Ace wrap.  Continue with pressure offloading to the right heel.  Follow-Up Instructions: Return in about 1 week (around 09/09/2023).   Ortho Exam  Patient is alert, oriented, no adenopathy, well-dressed, normal affect, normal respiratory effort. Examination patient has a stable blood blister on the right heel.  It measures 4 x 4 cm there is no cellulitis no drainage.  Examination of the proximal lateral tibial plateau shows a persistent ulcer.  After informed consent a 10 blade knife was used to debride the skin and soft tissue back to bleeding viable granulation tissue.  There is no exposed hardware or bone.  After debridement the ulcer is 2 x 4 cm.  Will apply Kerecis micro graft plus a dry dressing and compression for the right lower extremity.  Imaging: No results found.      Labs: Lab Results  Component Value Date   HGBA1C 5.9 (H) 10/08/2022   REPTSTATUS 11/23/2022 FINAL 11/22/2022    CULT (A) 11/22/2022    >=100,000 COLONIES/mL LACTOBACILLUS SPECIES Standardized susceptibility testing for this organism is not available. >=100,000 COLONIES/mL YEAST      Lab Results  Component Value Date   ALBUMIN 3.1 (L) 05/23/2023   ALBUMIN 2.8 (L) 12/03/2022   ALBUMIN 2.6 (L) 10/11/2022    Lab Results  Component Value Date   MG 2.2 05/26/2023   MG 2.2 05/25/2023   MG 2.1 05/23/2023   Lab Results  Component Value Date   VD25OH 39.82 10/17/2020   VD25OH 33.42 04/11/2020   VD25OH 44.7 02/24/2019    No results found for: "PREALBUMIN"    Latest Ref Rng & Units 05/26/2023    4:36 AM 05/25/2023    4:33 AM 05/24/2023    8:28 AM  CBC EXTENDED  WBC 4.0 - 10.5 K/uL 8.2  8.0  7.8   RBC 3.87 - 5.11 MIL/uL 2.51  2.50  2.69   Hemoglobin 12.0 - 15.0 g/dL 7.7  7.7  8.5   HCT 19.1 - 46.0 % 24.5  24.6  26.1   Platelets 150 - 400 K/uL 217  206  200      There is no height or weight on file to calculate BMI.  Orders:  No orders of the defined types were placed in this encounter.  No orders of the defined types were placed in this encounter.    Procedures: No procedures performed  Clinical Data: No additional findings.  ROS:  All other systems negative, except as noted in the HPI. Review of Systems  Objective: Vital Signs: There were no vitals taken for this visit.  Specialty Comments:  No specialty comments available.  PMFS History: Patient Active Problem List   Diagnosis Date Noted   Closed fracture of right proximal tibia 05/23/2023   Laceration of left upper extremity 05/23/2023   History of type 2 diabetes mellitus 05/23/2023   Acute hypoxic respiratory failure (HCC) 03/23/2023   Closed comminuted intra-articular fracture of distal femur, right, with delayed healing, subsequent encounter 11/21/2022   Closed displaced comminuted fracture of shaft of right femur (HCC) 11/21/2022   Iron deficiency 10/27/2022   Aortic atherosclerosis (HCC) 10/19/2022    Hypertension associated with type 2 diabetes mellitus (HCC) 10/19/2022   Cystitis with hematuria 10/11/2022   Postoperative fever 10/10/2022   Closed fracture of right hip (HCC) 10/08/2022   Dementia without behavioral disturbance (HCC) 10/08/2022   Fall at home, initial encounter 10/08/2022   Aortic valve stenosis, nonrheumatic 03/24/2022   Pre-diabetes 03/24/2022   Disorder of mitral valve 03/24/2022   Essential (primary) hypertension 03/24/2022   Fibrosis lung (HCC) 03/24/2022   Acute blood loss anemia 12/15/2016   B12 deficiency 06/08/2016   Breast CA (HCC) 06/26/2014   Malignant neoplasm of upper-outer quadrant of RIGHT female breast (HCC) 04/25/2013   Osteoporosis 08/10/2007   Past Medical History:  Diagnosis Date   B12 deficiency 06/08/2016   Breast cancer (HCC)    Breast cancer, right breast (HCC) 04/25/2013   Cancer (HCC)    Claustrophobia    Claustrophobia    Dementia (HCC)    Hypertension    Iron deficiency anemia 12/15/2016   Malignant neoplasm of upper-outer quadrant of RIGHT female breast (HCC) 04/25/2013   Stage IA (T1b, N0, M0) invasive mucinous breast carcinoma, S/P right breast lumpectomy by Dr. Lovell Sheehan on 03/08/2013 with 1 negative sentinel node. Cancer was identified as ER+ 100%, PR+ 85%, Her2 negative, and Ki-67 marker at 17%. Oncotype Dx score of 20 with 13% 10 year risk of distant recurrence and absolute benefit of chemotherapy at 10 years in this risk category is negligible and less than 1%.   Osteoporosis     Family History  Problem Relation Age of Onset   Diabetes Mother    Cancer Father    Cancer Sister    Cancer Brother     Past Surgical History:  Procedure Laterality Date   ABDOMINAL HYSTERECTOMY     CATARACT EXTRACTION, BILATERAL     FEMUR IM NAIL Right 11/22/2022   Procedure: INTRAMEDULLARY (IM) RETROGRADE FEMORAL NAILING;  Surgeon: Oliver Barre, MD;  Location: [facility];  Service: Orthopedics;  Laterality: Right;   HIP ARTHROPLASTY Right 10/09/2022    Procedure: ARTHROPLASTY BIPOLAR HIP (HEMIARTHROPLASTY);  Surgeon: Vickki Hearing, MD;  Location: [facility];  Service: Orthopedics;  Laterality: Right;   ORIF TIBIA PLATEAU Right 05/24/2023   Procedure: OPEN REDUCTION INTERNAL FIXATION (ORIF) TIBIAL PLATEAU;  Surgeon: Oliver Barre, MD;  Location: [facility];  Service: Orthopedics;  Laterality: Right;   PARTIAL MASTECTOMY WITH NEEDLE LOCALIZATION AND AXILLARY SENTINEL LYMPH NODE BX Right 03/08/2013   Procedure: PARTIAL MASTECTOMY WITH NEEDLE LOCALIZATION AND AXILLARY SENTINEL LYMPH NODE BX;  Surgeon: Dalia Heading, MD;  Location: [facility];  Service: General;  Laterality: Right;  Sentinel Node Bx @  7:30am Needle Loc @ 8:00am   Social History   Occupational History   Not on file  Tobacco Use   Smoking status: Former    Current packs/day: 0.00    Average packs/day: 0.5 packs/day for 40.0 years (20.0 ttl pk-yrs)    Types: Cigarettes    Start date: 03/03/1927    Quit date: 03/03/1967    Years since quitting: 56.5   Smokeless tobacco: Never  Vaping Use   Vaping status: Never Used  Substance and Sexual Activity   Alcohol use: No   Drug use: No   Sexual activity: Yes    Birth control/protection: Surgical

## 2023-09-04 DIAGNOSIS — M6281 Muscle weakness (generalized): Secondary | ICD-10-CM | POA: Diagnosis not present

## 2023-09-04 DIAGNOSIS — J8410 Pulmonary fibrosis, unspecified: Secondary | ICD-10-CM | POA: Diagnosis not present

## 2023-09-04 DIAGNOSIS — R262 Difficulty in walking, not elsewhere classified: Secondary | ICD-10-CM | POA: Diagnosis not present

## 2023-09-04 DIAGNOSIS — S72001D Fracture of unspecified part of neck of right femur, subsequent encounter for closed fracture with routine healing: Secondary | ICD-10-CM | POA: Diagnosis not present

## 2023-09-07 DIAGNOSIS — Z79899 Other long term (current) drug therapy: Secondary | ICD-10-CM | POA: Diagnosis not present

## 2023-09-07 DIAGNOSIS — J84112 Idiopathic pulmonary fibrosis: Secondary | ICD-10-CM | POA: Diagnosis not present

## 2023-09-07 DIAGNOSIS — F02818 Dementia in other diseases classified elsewhere, unspecified severity, with other behavioral disturbance: Secondary | ICD-10-CM | POA: Diagnosis not present

## 2023-09-08 ENCOUNTER — Ambulatory Visit: Payer: PPO | Admitting: Family

## 2023-09-08 DIAGNOSIS — S82101D Unspecified fracture of upper end of right tibia, subsequent encounter for closed fracture with routine healing: Secondary | ICD-10-CM | POA: Diagnosis not present

## 2023-09-08 DIAGNOSIS — R531 Weakness: Secondary | ICD-10-CM | POA: Diagnosis not present

## 2023-09-08 DIAGNOSIS — I10 Essential (primary) hypertension: Secondary | ICD-10-CM | POA: Diagnosis not present

## 2023-09-08 DIAGNOSIS — E119 Type 2 diabetes mellitus without complications: Secondary | ICD-10-CM | POA: Diagnosis not present

## 2023-09-08 DIAGNOSIS — F0390 Unspecified dementia without behavioral disturbance: Secondary | ICD-10-CM | POA: Diagnosis not present

## 2023-09-08 DIAGNOSIS — Z9181 History of falling: Secondary | ICD-10-CM | POA: Diagnosis not present

## 2023-09-09 ENCOUNTER — Telehealth: Payer: Self-pay

## 2023-09-09 NOTE — Telephone Encounter (Signed)
Sarah with [facility] would like to clarify wound care orders for right pretibial and right heel.  Cb# [phone].  Please advise.  Thank you.

## 2023-09-09 NOTE — Telephone Encounter (Signed)
Pt was on the schedule today for follow up, but she rescheduled for 09/14/23. Last week she had kerecis skin graft applied with adaptic dressing and gauze and ace wrap over top. She was to change the outer dressings, leaving the adaptic in place, everyday. Since it has been a week, she can now removed scaffolding dressing and wash with dial soap and water. Use dry dressing for compression.

## 2023-09-10 ENCOUNTER — Ambulatory Visit: Payer: PPO | Admitting: Family

## 2023-09-10 NOTE — Telephone Encounter (Signed)
SW Maralyn Sago, gave her info below. Will update her next week if anything changes.

## 2023-09-14 ENCOUNTER — Ambulatory Visit: Payer: PPO | Admitting: Family

## 2023-09-14 DIAGNOSIS — S82101D Unspecified fracture of upper end of right tibia, subsequent encounter for closed fracture with routine healing: Secondary | ICD-10-CM | POA: Diagnosis not present

## 2023-09-14 DIAGNOSIS — L97212 Non-pressure chronic ulcer of right calf with fat layer exposed: Secondary | ICD-10-CM

## 2023-09-14 DIAGNOSIS — T8131XD Disruption of external operation (surgical) wound, not elsewhere classified, subsequent encounter: Secondary | ICD-10-CM | POA: Diagnosis not present

## 2023-09-14 DIAGNOSIS — D649 Anemia, unspecified: Secondary | ICD-10-CM | POA: Diagnosis not present

## 2023-09-14 DIAGNOSIS — E440 Moderate protein-calorie malnutrition: Secondary | ICD-10-CM | POA: Diagnosis not present

## 2023-09-14 DIAGNOSIS — L89610 Pressure ulcer of right heel, unstageable: Secondary | ICD-10-CM | POA: Diagnosis not present

## 2023-09-15 ENCOUNTER — Encounter: Payer: Self-pay | Admitting: Family

## 2023-09-15 NOTE — Progress Notes (Addendum)
 Office Visit Note   Patient: LAQUITA HARLAN           Date of Birth: 1934-04-04           MRN: [id number] Visit Date: 09/14/2023              Requested by: [name], MD [address] [place],  [place] [address] PCP: [name], MD  Chief Complaint  Patient presents with   Right Leg - Wound Check      HPI: Patient is an 89 year old woman who is about 4 months out from open reduction internal fixation right tibial plateau fracture.  She has developed wound dehiscence.  Patient is also developed a decubitus right heel ulcer she is currently in a PRAFO for this.  At last visit her ulcer was debrided and Kerecis micro graft was applied.  Assessment & Plan: Visit Diagnoses:  No diagnosis found.   Plan: The ulcer was debrided and Kerecis micro graft was applied.  Pictures were obtained.  Plan for changing the 4 x 4 gauze on the outside of the Adaptic daily with Ace wrap.  Continue with pressure offloading to the right heel.  Follow-Up Instructions: No follow-ups on file.   Ortho Exam  Patient is alert, oriented, no adenopathy, well-dressed, normal affect, normal respiratory effort. Examination patient has a stable blood blister on the right heel.  It measures 4 x 4 cm there is no cellulitis no drainage.    Examination of the proximal lateral tibial plateau shows a persistent ulcer. There is no exposed hardware or bone.  After debridement the ulcer is 2.5 x 1.5 cm.  With 2 mm of depth. will apply Kerecis micro graft plus a dry dressing and compression for the right lower extremity.  The wound healing has stalled, the wound bed has healthy granulation tissue, and patient presents for evaluation and application of Kerecis MariGen Micro graft. After informed consent a 10 blade knife was used to debride the skin and soft tissue to healthy viable bleeding granulation tissue.  Silver nitrate was used for hemostasis. The wound measures: 2.5 cm in length, 1.5cm  in width, 2 mm in  depth, wound location right proximal lateral tibial plateau. Kerecis MariGen micro tissue graft 4 cm2 was applied, and there was no wastage.  Please see the photo below of the Lot number and expiration date. The micro tissue graft was covered with a nonadherent Adaptic dressing, bolstered with 4 x 4 gauze and secured with a compression wrap.     Please see attached images  Imaging: No results found.      Labs: Lab Results  Component Value Date   HGBA1C 5.9 (H) 10/08/2022   REPTSTATUS 11/23/2022 FINAL 11/22/2022   CULT (A) 11/22/2022    >=100,000 COLONIES/mL LACTOBACILLUS SPECIES Standardized susceptibility testing for this organism is not available. >=100,000 COLONIES/mL YEAST      Lab Results  Component Value Date   ALBUMIN 3.1 (L) 05/23/2023   ALBUMIN 2.8 (L) 12/03/2022   ALBUMIN 2.6 (L) 10/11/2022    Lab Results  Component Value Date   MG 2.2 05/26/2023   MG 2.2 05/25/2023   MG 2.1 05/23/2023   Lab Results  Component Value Date   VD25OH 39.82 10/17/2020   VD25OH 33.42 04/11/2020   VD25OH 44.7 02/24/2019    No results found for: "PREALBUMIN"    Latest Ref Rng & Units 05/26/2023    4:36 AM 05/25/2023    4:33 AM 05/24/2023    8:28  AM  CBC EXTENDED  WBC 4.0 - 10.5 K/uL 8.2  8.0  7.8   RBC 3.87 - 5.11 MIL/uL 2.51  2.50  2.69   Hemoglobin 12.0 - 15.0 g/dL 7.7  7.7  8.5   HCT 46.9 - 46.0 % 24.5  24.6  26.1   Platelets 150 - 400 K/uL 217  206  200      There is no height or weight on file to calculate BMI.  Orders:  No orders of the defined types were placed in this encounter.  No orders of the defined types were placed in this encounter.    Procedures: No procedures performed  Clinical Data: No additional findings.  ROS:  All other systems negative, except as noted in the HPI. Review of Systems  Objective: Vital Signs: There were no vitals taken for this visit.  Specialty Comments:  No specialty comments available.  PMFS  History: Patient Active Problem List   Diagnosis Date Noted   Closed fracture of right proximal tibia 05/23/2023   Laceration of left upper extremity 05/23/2023   History of type 2 diabetes mellitus 05/23/2023   Acute hypoxic respiratory failure (HCC) 03/23/2023   Closed comminuted intra-articular fracture of distal femur, right, with delayed healing, subsequent encounter 11/21/2022   Closed displaced comminuted fracture of shaft of right femur (HCC) 11/21/2022   Iron deficiency 10/27/2022   Aortic atherosclerosis (HCC) 10/19/2022   Hypertension associated with type 2 diabetes mellitus (HCC) 10/19/2022   Cystitis with hematuria 10/11/2022   Postoperative fever 10/10/2022   Closed fracture of right hip (HCC) 10/08/2022   Dementia without behavioral disturbance (HCC) 10/08/2022   Fall at home, initial encounter 10/08/2022   Aortic valve stenosis, nonrheumatic 03/24/2022   Pre-diabetes 03/24/2022   Disorder of mitral valve 03/24/2022   Essential (primary) hypertension 03/24/2022   Fibrosis lung (HCC) 03/24/2022   Acute blood loss anemia 12/15/2016   B12 deficiency 06/08/2016   Breast CA (HCC) 06/26/2014   Malignant neoplasm of upper-outer quadrant of RIGHT female breast (HCC) 04/25/2013   Osteoporosis 08/10/2007   Past Medical History:  Diagnosis Date   B12 deficiency 06/08/2016   Breast cancer (HCC)    Breast cancer, right breast (HCC) 04/25/2013   Cancer (HCC)    Claustrophobia    Claustrophobia    Dementia (HCC)    Hypertension    Iron deficiency anemia 12/15/2016   Malignant neoplasm of upper-outer quadrant of RIGHT female breast (HCC) 04/25/2013   Stage IA (T1b, N0, M0) invasive mucinous breast carcinoma, S/P right breast lumpectomy by Dr. Lovell Sheehan on 03/08/2013 with 1 negative sentinel node. Cancer was identified as ER+ 100%, PR+ 85%, Her2 negative, and Ki-67 marker at 17%. Oncotype Dx score of 20 with 13% 10 year risk of distant recurrence and absolute benefit of chemotherapy  at 10 years in this risk category is negligible and less than 1%.   Osteoporosis     Family History  Problem Relation Age of Onset   Diabetes Mother    Cancer Father    Cancer Sister    Cancer Brother     Past Surgical History:  Procedure Laterality Date   ABDOMINAL HYSTERECTOMY     CATARACT EXTRACTION, BILATERAL     FEMUR IM NAIL Right 11/22/2022   Procedure: INTRAMEDULLARY (IM) RETROGRADE FEMORAL NAILING;  Surgeon: Oliver Barre, MD;  Location: [facility];  Service: Orthopedics;  Laterality: Right;   HIP ARTHROPLASTY Right 10/09/2022   Procedure: ARTHROPLASTY BIPOLAR HIP (HEMIARTHROPLASTY);  Surgeon: Vickki Hearing,  MD;  Location: [facility];  Service: Orthopedics;  Laterality: Right;   ORIF TIBIA PLATEAU Right 05/24/2023   Procedure: OPEN REDUCTION INTERNAL FIXATION (ORIF) TIBIAL PLATEAU;  Surgeon: Oliver Barre, MD;  Location: [facility];  Service: Orthopedics;  Laterality: Right;   PARTIAL MASTECTOMY WITH NEEDLE LOCALIZATION AND AXILLARY SENTINEL LYMPH NODE BX Right 03/08/2013   Procedure: PARTIAL MASTECTOMY WITH NEEDLE LOCALIZATION AND AXILLARY SENTINEL LYMPH NODE BX;  Surgeon: Dalia Heading, MD;  Location: [facility];  Service: General;  Laterality: Right;  Sentinel Node Bx @ 7:30am Needle Loc @ 8:00am   Social History   Occupational History   Not on file  Tobacco Use   Smoking status: Former    Current packs/day: 0.00    Average packs/day: 0.5 packs/day for 40.0 years (20.0 ttl pk-yrs)    Types: Cigarettes    Start date: 03/03/1927    Quit date: 03/03/1967    Years since quitting: 56.5   Smokeless tobacco: Never  Vaping Use   Vaping status: Never Used  Substance and Sexual Activity   Alcohol use: No   Drug use: No   Sexual activity: Yes    Birth control/protection: Surgical

## 2023-09-28 ENCOUNTER — Ambulatory Visit: Payer: PPO | Admitting: Family

## 2023-09-28 ENCOUNTER — Encounter: Payer: Self-pay | Admitting: Family

## 2023-09-28 DIAGNOSIS — L89610 Pressure ulcer of right heel, unstageable: Secondary | ICD-10-CM

## 2023-09-28 DIAGNOSIS — T8131XD Disruption of external operation (surgical) wound, not elsewhere classified, subsequent encounter: Secondary | ICD-10-CM

## 2023-09-28 NOTE — Progress Notes (Addendum)
 Office Visit Note   Patient: Tamara [name]           Date of Birth: Aug 12, 1933           MRN: [id number] Visit Date: 09/28/2023              Requested by: [name], MD [address] [place],  [place] [address] PCP: [name], MD  Chief Complaint  Patient presents with   Right Leg - Follow-up      HPI: Patient is an 89 year old woman who is about 4 months out from open reduction internal fixation right tibial plateau fracture.  She has developed wound dehiscence.  Patient has also developed a decubitus right heel ulcer she is currently in a PRAFO for this.  At last visit her ulcer was debrided and Kerecis micro graft was applied.  Assessment & Plan: Visit Diagnoses:  No diagnosis found.   Plan: donation kerecis micropowder applied today. In one week she Will begin Vashe dressing changes daily to the right lower leg.  Continue with pressure offloading to the right heel.  Follow-Up Instructions: No follow-ups on file.   Ortho Exam  Patient is alert, oriented, no adenopathy, well-dressed, normal affect, normal respiratory effort. Examination patient has a stable decubitus ulcer on the right heel.  It measures 3-1/2 x 3-1/2 cm there is no cellulitis no drainage.    Examination of the proximal lateral tibial plateau shows a persistent ulcer. There is no exposed hardware or bone.  After debridement the ulcer is 2x 0.6 cm.  With 2 mm of depth.  No surrounding erythema no active drainage   Please see attached images  Imaging: No results found.      Labs: Lab Results  Component Value Date   HGBA1C 5.9 (H) 10/08/2022   REPTSTATUS 11/23/2022 FINAL 11/22/2022   CULT (A) 11/22/2022    >=100,000 COLONIES/mL LACTOBACILLUS SPECIES Standardized susceptibility testing for this organism is not available. >=100,000 COLONIES/mL YEAST      Lab Results  Component Value Date   ALBUMIN 3.1 (L) 05/23/2023   ALBUMIN 2.8 (L) 12/03/2022   ALBUMIN 2.6 (L) 10/11/2022     Lab Results  Component Value Date   MG 2.2 05/26/2023   MG 2.2 05/25/2023   MG 2.1 05/23/2023   Lab Results  Component Value Date   VD25OH 39.82 10/17/2020   VD25OH 33.42 04/11/2020   VD25OH 44.7 02/24/2019    No results found for: "PREALBUMIN"    Latest Ref Rng & Units 05/26/2023    4:36 AM 05/25/2023    4:33 AM 05/24/2023    8:28 AM  CBC EXTENDED  WBC 4.0 - 10.5 K/uL 8.2  8.0  7.8   RBC 3.87 - 5.11 MIL/uL 2.51  2.50  2.69   Hemoglobin 12.0 - 15.0 g/dL 7.7  7.7  8.5   HCT 72.5 - 46.0 % 24.5  24.6  26.1   Platelets 150 - 400 K/uL 217  206  200      There is no height or weight on file to calculate BMI.  Orders:  No orders of the defined types were placed in this encounter.  No orders of the defined types were placed in this encounter.    Procedures: No procedures performed  Clinical Data: No additional findings.  ROS:  All other systems negative, except as noted in the HPI. Review of Systems  Objective: Vital Signs: There were no vitals taken for this visit.  Specialty Comments:  No specialty  comments available.  PMFS History: Patient Active Problem List   Diagnosis Date Noted   Closed fracture of right proximal tibia 05/23/2023   Laceration of left upper extremity 05/23/2023   History of type 2 diabetes mellitus 05/23/2023   Acute hypoxic respiratory failure (HCC) 03/23/2023   Closed comminuted intra-articular fracture of distal femur, right, with delayed healing, subsequent encounter 11/21/2022   Closed displaced comminuted fracture of shaft of right femur (HCC) 11/21/2022   Iron deficiency 10/27/2022   Aortic atherosclerosis (HCC) 10/19/2022   Hypertension associated with type 2 diabetes mellitus (HCC) 10/19/2022   Cystitis with hematuria 10/11/2022   Postoperative fever 10/10/2022   Closed fracture of right hip (HCC) 10/08/2022   Dementia without behavioral disturbance (HCC) 10/08/2022   Fall at home, initial encounter 10/08/2022   Aortic  valve stenosis, nonrheumatic 03/24/2022   Pre-diabetes 03/24/2022   Disorder of mitral valve 03/24/2022   Essential (primary) hypertension 03/24/2022   Fibrosis lung (HCC) 03/24/2022   Acute blood loss anemia 12/15/2016   B12 deficiency 06/08/2016   Breast CA (HCC) 06/26/2014   Malignant neoplasm of upper-outer quadrant of RIGHT female breast (HCC) 04/25/2013   Osteoporosis 08/10/2007   Past Medical History:  Diagnosis Date   B12 deficiency 06/08/2016   Breast cancer (HCC)    Breast cancer, right breast (HCC) 04/25/2013   Cancer (HCC)    Claustrophobia    Claustrophobia    Dementia (HCC)    Hypertension    Iron deficiency anemia 12/15/2016   Malignant neoplasm of upper-outer quadrant of RIGHT female breast (HCC) 04/25/2013   Stage IA (T1b, N0, M0) invasive mucinous breast carcinoma, S/P right breast lumpectomy by Dr. Lovell Sheehan on 03/08/2013 with 1 negative sentinel node. Cancer was identified as ER+ 100%, PR+ 85%, Her2 negative, and Ki-67 marker at 17%. Oncotype Dx score of 20 with 13% 10 year risk of distant recurrence and absolute benefit of chemotherapy at 10 years in this risk category is negligible and less than 1%.   Osteoporosis     Family History  Problem Relation Age of Onset   Diabetes Mother    Cancer Father    Cancer Sister    Cancer Brother     Past Surgical History:  Procedure Laterality Date   ABDOMINAL HYSTERECTOMY     CATARACT EXTRACTION, BILATERAL     FEMUR IM NAIL Right 11/22/2022   Procedure: INTRAMEDULLARY (IM) RETROGRADE FEMORAL NAILING;  Surgeon: Oliver Barre, MD;  Location: [facility];  Service: Orthopedics;  Laterality: Right;   HIP ARTHROPLASTY Right 10/09/2022   Procedure: ARTHROPLASTY BIPOLAR HIP (HEMIARTHROPLASTY);  Surgeon: Vickki Hearing, MD;  Location: [facility];  Service: Orthopedics;  Laterality: Right;   ORIF TIBIA PLATEAU Right 05/24/2023   Procedure: OPEN REDUCTION INTERNAL FIXATION (ORIF) TIBIAL PLATEAU;  Surgeon: Oliver Barre, MD;  Location: [facility];  Service: Orthopedics;  Laterality: Right;   PARTIAL MASTECTOMY WITH NEEDLE LOCALIZATION AND AXILLARY SENTINEL LYMPH NODE BX Right 03/08/2013   Procedure: PARTIAL MASTECTOMY WITH NEEDLE LOCALIZATION AND AXILLARY SENTINEL LYMPH NODE BX;  Surgeon: Dalia Heading, MD;  Location: [facility];  Service: General;  Laterality: Right;  Sentinel Node Bx @ 7:30am Needle Loc @ 8:00am   Social History   Occupational History   Not on file  Tobacco Use   Smoking status: Former    Current packs/day: 0.00    Average packs/day: 0.5 packs/day for 40.0 years (20.0 ttl pk-yrs)    Types: Cigarettes    Start date: 03/03/1927  Quit date: 03/03/1967    Years since quitting: 56.6   Smokeless tobacco: Never  Vaping Use   Vaping status: Never Used  Substance and Sexual Activity   Alcohol use: No   Drug use: No   Sexual activity: Yes    Birth control/protection: Surgical

## 2023-09-30 ENCOUNTER — Telehealth: Payer: Self-pay | Admitting: Orthopedic Surgery

## 2023-09-30 NOTE — Telephone Encounter (Signed)
 Tamara [name] with Tamara [name] calling to verify wound care orders, was told by patients daughter that Dr Lajoyce Corners change the wound care ordered.     Tamara [name]'s callback number [phone]

## 2023-09-30 NOTE — Telephone Encounter (Signed)
 Called and sw HHN to advise to do Vashe wet/dry dressing to the anterior medial ulcer of knee and can continue to paint heel ulcer with betadine and cover with gauze then apply ace toes to calf daily. Will cal with any questions.

## 2023-10-02 DIAGNOSIS — S72001D Fracture of unspecified part of neck of right femur, subsequent encounter for closed fracture with routine healing: Secondary | ICD-10-CM | POA: Diagnosis not present

## 2023-10-02 DIAGNOSIS — R262 Difficulty in walking, not elsewhere classified: Secondary | ICD-10-CM | POA: Diagnosis not present

## 2023-10-02 DIAGNOSIS — J8410 Pulmonary fibrosis, unspecified: Secondary | ICD-10-CM | POA: Diagnosis not present

## 2023-10-02 DIAGNOSIS — M6281 Muscle weakness (generalized): Secondary | ICD-10-CM | POA: Diagnosis not present

## 2023-10-06 DIAGNOSIS — S82101D Unspecified fracture of upper end of right tibia, subsequent encounter for closed fracture with routine healing: Secondary | ICD-10-CM | POA: Diagnosis not present

## 2023-10-06 DIAGNOSIS — R531 Weakness: Secondary | ICD-10-CM | POA: Diagnosis not present

## 2023-10-06 DIAGNOSIS — S82201D Unspecified fracture of shaft of right tibia, subsequent encounter for closed fracture with routine healing: Secondary | ICD-10-CM | POA: Diagnosis not present

## 2023-10-06 DIAGNOSIS — F0390 Unspecified dementia without behavioral disturbance: Secondary | ICD-10-CM | POA: Diagnosis not present

## 2023-10-06 DIAGNOSIS — Z9181 History of falling: Secondary | ICD-10-CM | POA: Diagnosis not present

## 2023-10-06 DIAGNOSIS — I10 Essential (primary) hypertension: Secondary | ICD-10-CM | POA: Diagnosis not present

## 2023-10-06 DIAGNOSIS — E119 Type 2 diabetes mellitus without complications: Secondary | ICD-10-CM | POA: Diagnosis not present

## 2023-10-13 ENCOUNTER — Encounter: Payer: Self-pay | Admitting: Family

## 2023-10-13 ENCOUNTER — Ambulatory Visit: Admitting: Family

## 2023-10-13 DIAGNOSIS — S82101D Unspecified fracture of upper end of right tibia, subsequent encounter for closed fracture with routine healing: Secondary | ICD-10-CM

## 2023-10-13 DIAGNOSIS — T8131XD Disruption of external operation (surgical) wound, not elsewhere classified, subsequent encounter: Secondary | ICD-10-CM

## 2023-10-13 DIAGNOSIS — I872 Venous insufficiency (chronic) (peripheral): Secondary | ICD-10-CM | POA: Diagnosis not present

## 2023-10-13 DIAGNOSIS — L89610 Pressure ulcer of right heel, unstageable: Secondary | ICD-10-CM | POA: Diagnosis not present

## 2023-10-13 DIAGNOSIS — I10 Essential (primary) hypertension: Secondary | ICD-10-CM | POA: Diagnosis not present

## 2023-10-13 DIAGNOSIS — L89322 Pressure ulcer of left buttock, stage 2: Secondary | ICD-10-CM | POA: Diagnosis not present

## 2023-10-13 DIAGNOSIS — L89616 Pressure-induced deep tissue damage of right heel: Secondary | ICD-10-CM | POA: Diagnosis not present

## 2023-10-13 NOTE — Progress Notes (Signed)
 Office Visit Note   Patient: Tamara [name]           Date of Birth: 1934-01-04           MRN: [id number] Visit Date: 10/13/2023              Requested by: [name], MD [address] [place],  [place] [address] PCP: [name], MD  No chief complaint on file.     HPI: The patient is an 89 year old woman who is seen we have been following her for wound dehiscence she is status post open reduction internal fixation of a tibial plateau fracture, on the right.  Is wearing a PRAFO boot for decubitus ulcer of the same heel  Assessment & Plan: Visit Diagnoses: No diagnosis found.  Plan: Vashe soaked dressing applied to the wound they were instructed on home use of Vashe.  Discussed compression and elevation for the left lower extremity edema  Follow-Up Instructions: No follow-ups on file.   Ortho Exam  Patient is alert, oriented, no adenopathy, well-dressed, normal affect, normal respiratory effort. On examination of the right proximal lateral tibial plateau she has persistent ulceration.  There is no exposed bone no exposed hardware.  After debridement the ulcer measures 8 mm x 8 mm with 2 mm of depth There is no surrounding erythema maceration or sign of infection  On examination right heel decubitus ulcer is stable today measures 3 cm x 3-1/2 cm.  No depth covered in eschar There is no drainage erythema or sign of infection  On examination left lower extremity she has 2+ pitting edema with weeping.  There is no erythema warmth no open ulcer Imaging: No results found. No images are attached to the encounter.  Labs: Lab Results  Component Value Date   HGBA1C 5.9 (H) 10/08/2022   REPTSTATUS 11/23/2022 FINAL 11/22/2022   CULT (A) 11/22/2022    >=100,000 COLONIES/mL LACTOBACILLUS SPECIES Standardized susceptibility testing for this organism is not available. >=100,000 COLONIES/mL YEAST      Lab Results  Component Value Date   ALBUMIN 3.1 (L) 05/23/2023    ALBUMIN 2.8 (L) 12/03/2022   ALBUMIN 2.6 (L) 10/11/2022    Lab Results  Component Value Date   MG 2.2 05/26/2023   MG 2.2 05/25/2023   MG 2.1 05/23/2023   Lab Results  Component Value Date   VD25OH 39.82 10/17/2020   VD25OH 33.42 04/11/2020   VD25OH 44.7 02/24/2019    No results found for: "PREALBUMIN"    Latest Ref Rng & Units 05/26/2023    4:36 AM 05/25/2023    4:33 AM 05/24/2023    8:28 AM  CBC EXTENDED  WBC 4.0 - 10.5 K/uL 8.2  8.0  7.8   RBC 3.87 - 5.11 MIL/uL 2.51  2.50  2.69   Hemoglobin 12.0 - 15.0 g/dL 7.7  7.7  8.5   HCT 62.1 - 46.0 % 24.5  24.6  26.1   Platelets 150 - 400 K/uL 217  206  200      There is no height or weight on file to calculate BMI.  Orders:  No orders of the defined types were placed in this encounter.  No orders of the defined types were placed in this encounter.    Procedures: No procedures performed  Clinical Data: No additional findings.  ROS:  All other systems negative, except as noted in the HPI. Review of Systems  Objective: Vital Signs: There were no vitals taken for this visit.  Specialty Comments:  No specialty comments available.  PMFS History: Patient Active Problem List   Diagnosis Date Noted   Closed fracture of right proximal tibia 05/23/2023   Laceration of left upper extremity 05/23/2023   History of type 2 diabetes mellitus 05/23/2023   Acute hypoxic respiratory failure (HCC) 03/23/2023   Closed comminuted intra-articular fracture of distal femur, right, with delayed healing, subsequent encounter 11/21/2022   Closed displaced comminuted fracture of shaft of right femur (HCC) 11/21/2022   Iron deficiency 10/27/2022   Aortic atherosclerosis (HCC) 10/19/2022   Hypertension associated with type 2 diabetes mellitus (HCC) 10/19/2022   Cystitis with hematuria 10/11/2022   Postoperative fever 10/10/2022   Closed fracture of right hip (HCC) 10/08/2022   Dementia without behavioral disturbance (HCC) 10/08/2022    Fall at home, initial encounter 10/08/2022   Aortic valve stenosis, nonrheumatic 03/24/2022   Pre-diabetes 03/24/2022   Disorder of mitral valve 03/24/2022   Essential (primary) hypertension 03/24/2022   Fibrosis lung (HCC) 03/24/2022   Acute blood loss anemia 12/15/2016   B12 deficiency 06/08/2016   Breast CA (HCC) 06/26/2014   Malignant neoplasm of upper-outer quadrant of RIGHT female breast (HCC) 04/25/2013   Osteoporosis 08/10/2007   Past Medical History:  Diagnosis Date   B12 deficiency 06/08/2016   Breast cancer (HCC)    Breast cancer, right breast (HCC) 04/25/2013   Cancer (HCC)    Claustrophobia    Claustrophobia    Dementia (HCC)    Hypertension    Iron deficiency anemia 12/15/2016   Malignant neoplasm of upper-outer quadrant of RIGHT female breast (HCC) 04/25/2013   Stage IA (T1b, N0, M0) invasive mucinous breast carcinoma, S/P right breast lumpectomy by Dr. Lovell Sheehan on 03/08/2013 with 1 negative sentinel node. Cancer was identified as ER+ 100%, PR+ 85%, Her2 negative, and Ki-67 marker at 17%. Oncotype Dx score of 20 with 13% 10 year risk of distant recurrence and absolute benefit of chemotherapy at 10 years in this risk category is negligible and less than 1%.   Osteoporosis     Family History  Problem Relation Age of Onset   Diabetes Mother    Cancer Father    Cancer Sister    Cancer Brother     Past Surgical History:  Procedure Laterality Date   ABDOMINAL HYSTERECTOMY     CATARACT EXTRACTION, BILATERAL     FEMUR IM NAIL Right 11/22/2022   Procedure: INTRAMEDULLARY (IM) RETROGRADE FEMORAL NAILING;  Surgeon: Oliver Barre, MD;  Location: [facility];  Service: Orthopedics;  Laterality: Right;   HIP ARTHROPLASTY Right 10/09/2022   Procedure: ARTHROPLASTY BIPOLAR HIP (HEMIARTHROPLASTY);  Surgeon: Vickki Hearing, MD;  Location: [facility];  Service: Orthopedics;  Laterality: Right;   ORIF TIBIA PLATEAU Right 05/24/2023   Procedure: OPEN REDUCTION INTERNAL FIXATION (ORIF)  TIBIAL PLATEAU;  Surgeon: Oliver Barre, MD;  Location: [facility];  Service: Orthopedics;  Laterality: Right;   PARTIAL MASTECTOMY WITH NEEDLE LOCALIZATION AND AXILLARY SENTINEL LYMPH NODE BX Right 03/08/2013   Procedure: PARTIAL MASTECTOMY WITH NEEDLE LOCALIZATION AND AXILLARY SENTINEL LYMPH NODE BX;  Surgeon: Dalia Heading, MD;  Location: [facility];  Service: General;  Laterality: Right;  Sentinel Node Bx @ 7:30am Needle Loc @ 8:00am   Social History   Occupational History   Not on file  Tobacco Use   Smoking status: Former    Current packs/day: 0.00    Average packs/day: 0.5 packs/day for 40.0 years (20.0 ttl pk-yrs)    Types: Cigarettes  Start date: 03/03/1927    Quit date: 03/03/1967    Years since quitting: 56.6   Smokeless tobacco: Never  Vaping Use   Vaping status: Never Used  Substance and Sexual Activity   Alcohol use: No   Drug use: No   Sexual activity: Yes    Birth control/protection: Surgical

## 2023-10-27 ENCOUNTER — Ambulatory Visit: Admitting: Family

## 2023-10-29 ENCOUNTER — Ambulatory Visit: Admitting: Family

## 2023-10-29 DIAGNOSIS — T8131XD Disruption of external operation (surgical) wound, not elsewhere classified, subsequent encounter: Secondary | ICD-10-CM | POA: Diagnosis not present

## 2023-11-02 DIAGNOSIS — M6281 Muscle weakness (generalized): Secondary | ICD-10-CM | POA: Diagnosis not present

## 2023-11-02 DIAGNOSIS — J8410 Pulmonary fibrosis, unspecified: Secondary | ICD-10-CM | POA: Diagnosis not present

## 2023-11-02 DIAGNOSIS — R262 Difficulty in walking, not elsewhere classified: Secondary | ICD-10-CM | POA: Diagnosis not present

## 2023-11-02 DIAGNOSIS — S72001D Fracture of unspecified part of neck of right femur, subsequent encounter for closed fracture with routine healing: Secondary | ICD-10-CM | POA: Diagnosis not present

## 2023-11-06 DIAGNOSIS — I10 Essential (primary) hypertension: Secondary | ICD-10-CM | POA: Diagnosis not present

## 2023-11-06 DIAGNOSIS — R531 Weakness: Secondary | ICD-10-CM | POA: Diagnosis not present

## 2023-11-06 DIAGNOSIS — E119 Type 2 diabetes mellitus without complications: Secondary | ICD-10-CM | POA: Diagnosis not present

## 2023-11-06 DIAGNOSIS — S82101D Unspecified fracture of upper end of right tibia, subsequent encounter for closed fracture with routine healing: Secondary | ICD-10-CM | POA: Diagnosis not present

## 2023-11-06 DIAGNOSIS — F0390 Unspecified dementia without behavioral disturbance: Secondary | ICD-10-CM | POA: Diagnosis not present

## 2023-11-06 DIAGNOSIS — Z9181 History of falling: Secondary | ICD-10-CM | POA: Diagnosis not present

## 2023-11-09 ENCOUNTER — Other Ambulatory Visit (HOSPITAL_COMMUNITY): Payer: Self-pay | Admitting: Internal Medicine

## 2023-11-09 DIAGNOSIS — R059 Cough, unspecified: Secondary | ICD-10-CM

## 2023-11-09 DIAGNOSIS — R062 Wheezing: Secondary | ICD-10-CM

## 2023-11-10 ENCOUNTER — Other Ambulatory Visit (HOSPITAL_COMMUNITY): Payer: Self-pay | Admitting: Internal Medicine

## 2023-11-10 ENCOUNTER — Ambulatory Visit (HOSPITAL_COMMUNITY)
Admission: RE | Admit: 2023-11-10 | Discharge: 2023-11-10 | Disposition: A | Discharge status: Home / Self Care | Source: Ambulatory Visit | Attending: Internal Medicine | Admitting: Internal Medicine

## 2023-11-10 DIAGNOSIS — R062 Wheezing: Secondary | ICD-10-CM

## 2023-11-10 DIAGNOSIS — E440 Moderate protein-calorie malnutrition: Secondary | ICD-10-CM | POA: Diagnosis not present

## 2023-11-10 DIAGNOSIS — F0390 Unspecified dementia without behavioral disturbance: Secondary | ICD-10-CM | POA: Diagnosis not present

## 2023-11-10 DIAGNOSIS — Z79899 Other long term (current) drug therapy: Secondary | ICD-10-CM | POA: Diagnosis not present

## 2023-11-10 DIAGNOSIS — R059 Cough, unspecified: Secondary | ICD-10-CM

## 2023-11-10 DIAGNOSIS — J9811 Atelectasis: Secondary | ICD-10-CM | POA: Diagnosis not present

## 2023-11-17 ENCOUNTER — Encounter: Payer: Self-pay | Admitting: Family

## 2023-11-17 NOTE — Progress Notes (Signed)
 Office Visit Note   Patient: Tamara [name]           Date of Birth: October 21, 1933           MRN: [id number] Visit Date: 10/29/2023              Requested by: Artemisa Bile, MD [address] [place],  [place] [address] PCP: Artemisa Bile, MD  Chief Complaint  Patient presents with   Right Foot - Follow-up, Wound Check   Right Leg - Follow-up      HPI: The patient is an 89 year old woman who we have been following her for wound dehiscence she is status post open reduction internal fixation of a tibial plateau fracture, on the right.  Is wearing a PRAFO boot for decubitus ulcer of the same heel  Assessment & Plan: Visit Diagnoses:  1. Dehiscence of operative wound, subsequent encounter     Plan: Vashe soaked dressing changes to the proximal tibia wound dry dressing or Mepilex to the heel wound  Family is comfortable caring for this at home they will call return for any worsening  Discussed compression and elevation for the left lower extremity edema  Follow-Up Instructions: Return in about 2 months (around 12/29/2023).   Ortho Exam  Patient is alert, oriented, no adenopathy, well-dressed, normal affect, normal respiratory effort. On examination of the right proximal lateral tibial plateau she has persistent ulceration.  There is no exposed bone no exposed hardware.  After debridement the ulcer measures 4 mm x 5 mm with 1 mm of depth There is no surrounding erythema maceration or sign of infection  On examination right heel decubitus ulcer is stable today measures 3 cm x 3-1/2 cm.  No depth covered in eschar There is no drainage erythema or sign of infection  On examination left lower extremity she has 1+ pitting edema with weeping.  There is no erythema warmth no open ulcer Imaging: No results found. No images are attached to the encounter.  Labs: Lab Results  Component Value Date   HGBA1C 5.9 (H) 10/08/2022   REPTSTATUS 11/23/2022 FINAL 11/22/2022   CULT (A)  11/22/2022    >=100,000 COLONIES/mL LACTOBACILLUS SPECIES Standardized susceptibility testing for this organism is not available. >=100,000 COLONIES/mL YEAST      Lab Results  Component Value Date   ALBUMIN 3.1 (L) 05/23/2023   ALBUMIN 2.8 (L) 12/03/2022   ALBUMIN 2.6 (L) 10/11/2022    Lab Results  Component Value Date   MG 2.2 05/26/2023   MG 2.2 05/25/2023   MG 2.1 05/23/2023   Lab Results  Component Value Date   VD25OH 39.82 10/17/2020   VD25OH 33.42 04/11/2020   VD25OH 44.7 02/24/2019    No results found for: "PREALBUMIN"    Latest Ref Rng & Units 05/26/2023    4:36 AM 05/25/2023    4:33 AM 05/24/2023    8:28 AM  CBC EXTENDED  WBC 4.0 - 10.5 K/uL 8.2  8.0  7.8   RBC 3.87 - 5.11 MIL/uL 2.51  2.50  2.69   Hemoglobin 12.0 - 15.0 g/dL 7.7  7.7  8.5   HCT 29.5 - 46.0 % 24.5  24.6  26.1   Platelets 150 - 400 K/uL 217  206  200      There is no height or weight on file to calculate BMI.  Orders:  No orders of the defined types were placed in this encounter.  No orders of the defined types were placed in this  encounter.    Procedures: No procedures performed  Clinical Data: No additional findings.  ROS:  All other systems negative, except as noted in the HPI. Review of Systems  Objective: Vital Signs: There were no vitals taken for this visit.  Specialty Comments:  No specialty comments available.  PMFS History: Patient Active Problem List   Diagnosis Date Noted   Closed fracture of right proximal tibia 05/23/2023   Laceration of left upper extremity 05/23/2023   History of type 2 diabetes mellitus 05/23/2023   Acute hypoxic respiratory failure (HCC) 03/23/2023   Closed comminuted intra-articular fracture of distal femur, right, with delayed healing, subsequent encounter 11/21/2022   Closed displaced comminuted fracture of shaft of right femur (HCC) 11/21/2022   Iron  deficiency 10/27/2022   Aortic atherosclerosis (HCC) 10/19/2022   Hypertension  associated with type 2 diabetes mellitus (HCC) 10/19/2022   Cystitis with hematuria 10/11/2022   Postoperative fever 10/10/2022   Closed fracture of right hip (HCC) 10/08/2022   Dementia without behavioral disturbance (HCC) 10/08/2022   Fall at home, initial encounter 10/08/2022   Aortic valve stenosis, nonrheumatic 03/24/2022   Pre-diabetes 03/24/2022   Disorder of mitral valve 03/24/2022   Essential (primary) hypertension 03/24/2022   Fibrosis lung (HCC) 03/24/2022   Acute blood loss anemia 12/15/2016   B12 deficiency 06/08/2016   Breast CA (HCC) 06/26/2014   Malignant neoplasm of upper-outer quadrant of RIGHT female breast (HCC) 04/25/2013   Osteoporosis 08/10/2007   Past Medical History:  Diagnosis Date   B12 deficiency 06/08/2016   Breast cancer (HCC)    Breast cancer, right breast (HCC) 04/25/2013   Cancer (HCC)    Claustrophobia    Claustrophobia    Dementia (HCC)    Hypertension    Iron  deficiency anemia 12/15/2016   Malignant neoplasm of upper-outer quadrant of RIGHT female breast (HCC) 04/25/2013   Stage IA (T1b, N0, M0) invasive mucinous breast carcinoma, S/P right breast lumpectomy by Dr. Larrie Po on 03/08/2013 with 1 negative sentinel node. Cancer was identified as ER+ 100%, PR+ 85%, Her2 negative, and Ki-67 marker at 17%. Oncotype Dx score of 20 with 13% 10 year risk of distant recurrence and absolute benefit of chemotherapy at 10 years in this risk category is negligible and less than 1%.   Osteoporosis     Family History  Problem Relation Age of Onset   Diabetes Mother    Cancer Father    Cancer Sister    Cancer Brother     Past Surgical History:  Procedure Laterality Date   ABDOMINAL HYSTERECTOMY     CATARACT EXTRACTION, BILATERAL     FEMUR IM NAIL Right 11/22/2022   Procedure: INTRAMEDULLARY (IM) RETROGRADE FEMORAL NAILING;  Surgeon: Tonita Frater, MD;  Location: [facility];  Service: Orthopedics;  Laterality: Right;   HIP ARTHROPLASTY Right 10/09/2022    Procedure: ARTHROPLASTY BIPOLAR HIP (HEMIARTHROPLASTY);  Surgeon: Darrin Emerald, MD;  Location: [facility];  Service: Orthopedics;  Laterality: Right;   ORIF TIBIA PLATEAU Right 05/24/2023   Procedure: OPEN REDUCTION INTERNAL FIXATION (ORIF) TIBIAL PLATEAU;  Surgeon: Tonita Frater, MD;  Location: [facility];  Service: Orthopedics;  Laterality: Right;   PARTIAL MASTECTOMY WITH NEEDLE LOCALIZATION AND AXILLARY SENTINEL LYMPH NODE BX Right 03/08/2013   Procedure: PARTIAL MASTECTOMY WITH NEEDLE LOCALIZATION AND AXILLARY SENTINEL LYMPH NODE BX;  Surgeon: Beau Bound, MD;  Location: [facility];  Service: General;  Laterality: Right;  Sentinel Node Bx @ 7:30am Needle Loc @ 8:00am   Social History  Occupational History   Not on file  Tobacco Use   Smoking status: Former    Current packs/day: 0.00    Average packs/day: 0.5 packs/day for 40.0 years (20.0 ttl pk-yrs)    Types: Cigarettes    Start date: 03/03/1927    Quit date: 03/03/1967    Years since quitting: 56.7   Smokeless tobacco: Never  Vaping Use   Vaping status: Never Used  Substance and Sexual Activity   Alcohol use: No   Drug use: No   Sexual activity: Yes    Birth control/protection: Surgical

## 2023-11-25 DIAGNOSIS — N179 Acute kidney failure, unspecified: Secondary | ICD-10-CM | POA: Diagnosis not present

## 2023-11-25 DIAGNOSIS — E440 Moderate protein-calorie malnutrition: Secondary | ICD-10-CM | POA: Diagnosis not present

## 2023-11-25 DIAGNOSIS — F0390 Unspecified dementia without behavioral disturbance: Secondary | ICD-10-CM | POA: Diagnosis not present

## 2023-12-19 DEATH — deceased

## 2023-12-29 ENCOUNTER — Ambulatory Visit: Admitting: Family

## 2024-05-22 ENCOUNTER — Encounter: Payer: Self-pay | Admitting: Radiology
